# Patient Record
Sex: Female | Born: 1935
Health system: Southern US, Academic
[De-identification: ages and names within clinical notes are randomized; demographics above are authoritative.]

## PROBLEM LIST (undated history)

## (undated) ENCOUNTER — Ambulatory Visit

## (undated) ENCOUNTER — Ambulatory Visit: Payer: MEDICARE

## (undated) ENCOUNTER — Encounter
Attending: Student in an Organized Health Care Education/Training Program | Primary: Student in an Organized Health Care Education/Training Program

## (undated) ENCOUNTER — Encounter

## (undated) ENCOUNTER — Telehealth

## (undated) ENCOUNTER — Encounter: Attending: Hematology & Oncology | Primary: Hematology & Oncology

## (undated) ENCOUNTER — Encounter: Attending: Surgery | Primary: Surgery

## (undated) ENCOUNTER — Ambulatory Visit: Payer: MEDICARE | Attending: Surgery | Primary: Surgery

## (undated) ENCOUNTER — Telehealth: Attending: Radiation Oncology | Primary: Radiation Oncology

## (undated) ENCOUNTER — Ambulatory Visit: Payer: MEDICARE | Attending: Hematology & Oncology | Primary: Hematology & Oncology

## (undated) ENCOUNTER — Telehealth: Attending: Hematology & Oncology | Primary: Hematology & Oncology

## (undated) ENCOUNTER — Ambulatory Visit
Payer: MEDICARE | Attending: Student in an Organized Health Care Education/Training Program | Primary: Student in an Organized Health Care Education/Training Program

## (undated) ENCOUNTER — Ambulatory Visit
Attending: Student in an Organized Health Care Education/Training Program | Primary: Student in an Organized Health Care Education/Training Program

## (undated) DIAGNOSIS — K219 Gastro-esophageal reflux disease without esophagitis: Secondary | ICD-10-CM

## (undated) DIAGNOSIS — E119 Type 2 diabetes mellitus without complications: Secondary | ICD-10-CM

## (undated) DIAGNOSIS — I10 Essential (primary) hypertension: Secondary | ICD-10-CM

## (undated) DIAGNOSIS — T7840XA Allergy, unspecified, initial encounter: Secondary | ICD-10-CM

## (undated) DIAGNOSIS — B029 Zoster without complications: Secondary | ICD-10-CM

## (undated) DIAGNOSIS — E785 Hyperlipidemia, unspecified: Secondary | ICD-10-CM

## (undated) DIAGNOSIS — I639 Cerebral infarction, unspecified: Secondary | ICD-10-CM

## (undated) DIAGNOSIS — C801 Malignant (primary) neoplasm, unspecified: Secondary | ICD-10-CM

## (undated) HISTORY — PX: TONSILECTOMY, ADENOIDECTOMY, BILATERAL MYRINGOTOMY AND TUBES: SHX2538

## (undated) HISTORY — PX: KNEE SURGERY: SHX244

## (undated) HISTORY — PX: JOINT REPLACEMENT: SHX530

## (undated) HISTORY — PX: FOOT SURGERY: SHX648

## (undated) HISTORY — DX: Malignant (primary) neoplasm, unspecified: C80.1

## (undated) HISTORY — DX: Allergy, unspecified, initial encounter: T78.40XA

## (undated) HISTORY — PX: APPENDECTOMY: SHX54

## (undated) HISTORY — PX: BACK SURGERY: SHX140

## (undated) HISTORY — DX: Type 2 diabetes mellitus without complications: E11.9

## (undated) HISTORY — DX: Hyperlipidemia, unspecified: E78.5

## (undated) HISTORY — DX: Zoster without complications: B02.9

## (undated) HISTORY — DX: Cerebral infarction, unspecified: I63.9

---

## 2003-10-18 ENCOUNTER — Other Ambulatory Visit: Payer: Self-pay

## 2004-09-03 ENCOUNTER — Encounter: Payer: Self-pay | Admitting: Otolaryngology

## 2004-09-13 ENCOUNTER — Encounter: Payer: Self-pay | Admitting: Otolaryngology

## 2004-10-01 ENCOUNTER — Encounter: Payer: Self-pay | Admitting: Otolaryngology

## 2005-02-11 ENCOUNTER — Ambulatory Visit: Payer: Self-pay | Admitting: Specialist

## 2005-03-13 ENCOUNTER — Ambulatory Visit: Payer: Self-pay | Admitting: Unknown Physician Specialty

## 2005-03-14 ENCOUNTER — Ambulatory Visit: Payer: Self-pay | Admitting: Specialist

## 2005-04-13 ENCOUNTER — Ambulatory Visit: Payer: Self-pay | Admitting: Specialist

## 2005-05-14 ENCOUNTER — Ambulatory Visit: Payer: Self-pay | Admitting: Specialist

## 2005-06-14 ENCOUNTER — Ambulatory Visit: Payer: Self-pay | Admitting: Specialist

## 2006-08-08 ENCOUNTER — Emergency Department: Payer: Self-pay | Admitting: Internal Medicine

## 2006-08-08 ENCOUNTER — Other Ambulatory Visit: Payer: Self-pay

## 2006-12-22 ENCOUNTER — Emergency Department: Payer: Self-pay | Admitting: Emergency Medicine

## 2007-08-18 ENCOUNTER — Emergency Department: Payer: Self-pay | Admitting: Emergency Medicine

## 2007-08-18 ENCOUNTER — Other Ambulatory Visit: Payer: Self-pay

## 2007-08-18 ENCOUNTER — Ambulatory Visit: Payer: Self-pay | Admitting: Unknown Physician Specialty

## 2008-07-02 ENCOUNTER — Ambulatory Visit: Payer: Self-pay | Admitting: Unknown Physician Specialty

## 2008-08-31 ENCOUNTER — Encounter: Payer: Self-pay | Admitting: Rheumatology

## 2008-09-13 ENCOUNTER — Encounter: Payer: Self-pay | Admitting: Rheumatology

## 2009-01-20 ENCOUNTER — Ambulatory Visit: Payer: Self-pay | Admitting: Family

## 2009-02-13 ENCOUNTER — Encounter: Payer: Self-pay | Admitting: Physician Assistant

## 2009-02-25 ENCOUNTER — Ambulatory Visit: Payer: Self-pay | Admitting: Unknown Physician Specialty

## 2009-03-14 ENCOUNTER — Encounter: Payer: Self-pay | Admitting: Physician Assistant

## 2009-03-15 ENCOUNTER — Ambulatory Visit: Payer: Self-pay | Admitting: Pain Medicine

## 2009-03-20 ENCOUNTER — Other Ambulatory Visit: Payer: Self-pay | Admitting: Pain Medicine

## 2009-03-21 ENCOUNTER — Ambulatory Visit: Payer: Self-pay | Admitting: Pain Medicine

## 2009-04-10 ENCOUNTER — Ambulatory Visit: Payer: Self-pay | Admitting: Pain Medicine

## 2009-04-10 ENCOUNTER — Ambulatory Visit: Payer: Self-pay

## 2009-04-11 ENCOUNTER — Ambulatory Visit: Payer: Self-pay | Admitting: Pain Medicine

## 2009-04-26 ENCOUNTER — Ambulatory Visit: Payer: Self-pay | Admitting: Physician Assistant

## 2009-05-08 ENCOUNTER — Ambulatory Visit: Payer: Self-pay | Admitting: Pain Medicine

## 2009-05-11 ENCOUNTER — Ambulatory Visit: Payer: Self-pay | Admitting: Pain Medicine

## 2009-05-15 ENCOUNTER — Ambulatory Visit: Payer: Self-pay | Admitting: Pain Medicine

## 2009-05-24 ENCOUNTER — Ambulatory Visit: Payer: Self-pay | Admitting: Physician Assistant

## 2010-06-26 ENCOUNTER — Ambulatory Visit: Payer: Self-pay | Admitting: Otolaryngology

## 2010-07-25 ENCOUNTER — Encounter: Payer: Self-pay | Admitting: Unknown Physician Specialty

## 2010-08-14 ENCOUNTER — Encounter: Payer: Self-pay | Admitting: Unknown Physician Specialty

## 2011-04-01 ENCOUNTER — Emergency Department: Payer: Self-pay | Admitting: Emergency Medicine

## 2012-06-26 DIAGNOSIS — H18459 Nodular corneal degeneration, unspecified eye: Secondary | ICD-10-CM | POA: Insufficient documentation

## 2012-10-30 DIAGNOSIS — Z961 Presence of intraocular lens: Secondary | ICD-10-CM | POA: Insufficient documentation

## 2013-02-09 ENCOUNTER — Emergency Department: Payer: Self-pay | Admitting: Emergency Medicine

## 2013-02-09 LAB — COMPREHENSIVE METABOLIC PANEL
Albumin: 3.9 g/dL (ref 3.4–5.0)
Anion Gap: 5 — ABNORMAL LOW (ref 7–16)
Calcium, Total: 9.4 mg/dL (ref 8.5–10.1)
Chloride: 103 mmol/L (ref 98–107)
Creatinine: 0.77 mg/dL (ref 0.60–1.30)
EGFR (African American): >60
Glucose: 105 mg/dL — ABNORMAL HIGH (ref 65–99)
Osmolality: 282 (ref 275–301)
SGOT(AST): 23 U/L (ref 15–37)
SGPT (ALT): 13 U/L (ref 12–78)
Sodium: 140 mmol/L (ref 136–145)

## 2013-02-09 LAB — CBC
MCV: 92 fL (ref 80–100)
RDW: 14.4 % (ref 11.5–14.5)
WBC: 9.9 10*3/uL (ref 3.6–11.0)

## 2014-03-01 DIAGNOSIS — G43909 Migraine, unspecified, not intractable, without status migrainosus: Secondary | ICD-10-CM | POA: Insufficient documentation

## 2014-03-01 DIAGNOSIS — I10 Essential (primary) hypertension: Secondary | ICD-10-CM | POA: Insufficient documentation

## 2014-03-01 DIAGNOSIS — E785 Hyperlipidemia, unspecified: Secondary | ICD-10-CM | POA: Insufficient documentation

## 2014-03-01 DIAGNOSIS — E119 Type 2 diabetes mellitus without complications: Secondary | ICD-10-CM | POA: Insufficient documentation

## 2014-03-01 DIAGNOSIS — K219 Gastro-esophageal reflux disease without esophagitis: Secondary | ICD-10-CM | POA: Insufficient documentation

## 2014-05-16 ENCOUNTER — Ambulatory Visit: Payer: Self-pay | Admitting: Pain Medicine

## 2014-05-31 ENCOUNTER — Ambulatory Visit: Payer: Self-pay | Admitting: Pain Medicine

## 2014-06-07 ENCOUNTER — Ambulatory Visit: Payer: Self-pay | Admitting: Pain Medicine

## 2014-06-27 ENCOUNTER — Ambulatory Visit: Payer: Self-pay | Admitting: Pain Medicine

## 2014-08-02 ENCOUNTER — Ambulatory Visit: Payer: Self-pay | Admitting: Pain Medicine

## 2014-08-22 ENCOUNTER — Ambulatory Visit: Payer: Self-pay | Admitting: Pain Medicine

## 2016-06-06 ENCOUNTER — Telehealth: Payer: Self-pay | Admitting: *Deleted

## 2016-08-01 ENCOUNTER — Encounter: Payer: Self-pay | Admitting: Pain Medicine

## 2016-08-01 ENCOUNTER — Ambulatory Visit: Payer: Medicare Other | Attending: Pain Medicine | Admitting: Pain Medicine

## 2016-08-01 VITALS — BP 150/69 | HR 87 | Temp 97.8°F | Resp 16 | Ht 60.0 in | Wt 130.0 lb

## 2016-08-01 DIAGNOSIS — Z8673 Personal history of transient ischemic attack (TIA), and cerebral infarction without residual deficits: Secondary | ICD-10-CM | POA: Insufficient documentation

## 2016-08-01 DIAGNOSIS — E119 Type 2 diabetes mellitus without complications: Secondary | ICD-10-CM | POA: Insufficient documentation

## 2016-08-01 DIAGNOSIS — G8929 Other chronic pain: Secondary | ICD-10-CM | POA: Insufficient documentation

## 2016-08-01 DIAGNOSIS — M791 Myalgia: Secondary | ICD-10-CM | POA: Insufficient documentation

## 2016-08-01 DIAGNOSIS — E785 Hyperlipidemia, unspecified: Secondary | ICD-10-CM | POA: Diagnosis not present

## 2016-08-01 DIAGNOSIS — M533 Sacrococcygeal disorders, not elsewhere classified: Secondary | ICD-10-CM | POA: Diagnosis not present

## 2016-08-01 DIAGNOSIS — K219 Gastro-esophageal reflux disease without esophagitis: Secondary | ICD-10-CM | POA: Insufficient documentation

## 2016-08-01 DIAGNOSIS — Z880 Allergy status to penicillin: Secondary | ICD-10-CM | POA: Insufficient documentation

## 2016-08-01 DIAGNOSIS — I10 Essential (primary) hypertension: Secondary | ICD-10-CM | POA: Diagnosis not present

## 2016-08-01 DIAGNOSIS — M7918 Myalgia, other site: Secondary | ICD-10-CM

## 2016-08-01 DIAGNOSIS — Z961 Presence of intraocular lens: Secondary | ICD-10-CM | POA: Insufficient documentation

## 2016-08-01 NOTE — Progress Notes (Signed)
Patient's Name: Destiny [name]  MRN: [id number]  Referring Provider: Derinda Late, MD  DOB: 05/12/1924  PCP: No primary care provider on file.  DOS: 08/01/2016  Note by: Kathlen Brunswick. Dossie Arbour, MD  Service setting: Ambulatory outpatient  Specialty: Interventional Pain Management  Location: [facility] (AMB) Pain Management Facility    Patient type: New Patient   Primary Reason(s) for Visit: Initial Patient Evaluation CC: Back Pain (lower)  HPI  Ms. Overmier is a 82 y.o. year old, female patient, who comes today for an initial evaluation. She has Myofascial pain syndrome; Chronic left sacroiliac pain; Esophageal reflux; Benign essential hypertension; Migraine; Other and unspecified hyperlipidemia; Pseudophakia, both eyes; Salzmann's nodular dystrophy; and Type II or unspecified type diabetes mellitus without mention of complication, not stated as uncontrolled on her problem list.. Her primarily concern today is the Back Pain (lower)  Pain Assessment: Self-Reported Pain Score: 2 /10             Reported level is compatible with observation.       Pain Type: Chronic pain Pain Location: Back Pain Orientation: Lower Pain Descriptors / Indicators: Aching, Dull (more on the left side of lower back) Pain Frequency: Intermittent (pain is mainly in the morning when getting up out of bed  and lasts until  she is moving)  Onset and Duration: Sudden and Present longer than 3 months Cause of pain: Arthritis Severity: Getting worse, NAS-11 at its worse: 10/10 and NAS-11 on the average: 8/10 Timing: After activity or exercise Aggravating Factors: Bending, Lifiting, Prolonged sitting and Prolonged standing Alleviating Factors: Sitting Associated Problems: Pain that wakes patient up and Pain that does not allow patient to sleep Quality of Pain: Aching, Annoying, Sharp and Shooting Previous Examinations or Tests: Nerve block Previous Treatments: Trigger point injections  The patient comes into the clinics today  for the first time for a chronic pain management evaluation. The patient indicates having some left-sided low back pain for which she received an injection from me, many years ago. She did extremely well and it wasn't until recently that she started having some additional pain. By the time that she comes back into the clinics she indicates that her pain is minimal and she doesn't want to do anything at this point. She wants to have the option of coming in to have a shot whenever the pain returns. I told her that we would be more than happy to accommodate her for this. She is not interested in any medication management.  Today I took the time to provide the patient with information regarding my pain practice. The patient was informed that my practice is divided into two sections: an interventional pain management section, as well as a completely separate and distinct medication management section. The interventional portion of my practice takes place on Tuesdays and Thursdays, while the medication management is conducted on Mondays and Wednesdays. Because of the amount of documentation required on both them, they are kept separated. This means that there is the possibility that the patient may be scheduled for a procedure on Tuesday, while also having a medication management appointment on Wednesday. I have also informed the patient that because of current staffing and facility limitations, I no longer take patients for medication management only. To illustrate the reasons for this, I gave the patient the example of a surgeon and how inappropriate it would be to refer a patient to his/her practice so that they write for the post-procedure antibiotics on a surgery done by someone  else.   The patient was informed that joining my practice means that they are open to any and all interventional therapies. I clarified for the patient that this does not mean that they will be forced to have any procedures done. What it  means is that patients looking for a practitioner to simply write for their pain medications and not take advantage of other interventional techniques will be better served by a different practitioner, other than myself. I made it clear that I prefer to spend my time providing those services that I specialize in.  The patient was also made aware of my Comprehensive Pain Management Safety Guidelines where by joining my practice, they limit all of their nerve blocks and joint injections to those done by our practice, for as long as we are retained to manage their care.   Historic Controlled Substance Pharmacotherapy Review  Risk Assessment Profile: Aberrant/High Risk Behavior: None observed or detected today Risk Factors for Fatal Opioid Overdose: None identified today Fatal overdose hazard ratio (HR): Calculation deferred Non-fatal overdose hazard ratio (HR): Calculation deferred Substance Use Disorder (SUD) Risk Level: Pending results of Medical Psychology Evaluation for SUD Opioid Risk Tool (ORT) Total Score: 0 ORT Score Interpretation:  Score <3 = Low Risk for SUD  Score between 4-7 = Moderate Risk for SUD  Score >8 = High Risk for Opioid Abuse  Depression Scale Score: PHQ-2: 0   No depression (0) PHQ-9: 0   No depression (0-4)  Pharmacologic Plan: She is not interested in any medication therapy. The patient  reports that she does not use drugs.. Historical Illicit Drug Screen Labs(s): No results found for: MDMA, COCAINSCRNUR, PCPSCRNUR, THCU, ETH Meds  The patient has a current medication list which includes the following prescription(s): aspirin, butalbital-acetaminophen-caffeine, cetirizine, vitamin d3, cyanocobalamin, doxepin, fluticasone, glucose blood, guaifenesin-codeine, ibuprofen, metformin, onetouch delica lancets fine, probiotic product, telmisartan, and UNABLE TO FIND.  No current outpatient prescriptions on file prior to visit.   No current facility-administered  medications on file prior to visit.    Imaging Review  Shoulder Imaging: Shoulder-R MR wo contrast:  Results for orders placed in visit on 07/02/08  MR Shoulder Right Wo Contrast   Narrative * PRIOR REPORT IMPORTED FROM AN EXTERNAL SYSTEM *   PRIOR REPORT IMPORTED FROM THE [facility] WORKFLOW SYSTEM   REASON FOR EXAM:    rt shoulder pn  COMMENTS:   PROCEDURE:     MR  - MR SHOULDER RT  WO CONTRAST  - Jul 02 2008 12:23PM   RESULT:   HISTORY: Pain.   COMPARISON STUDIES: None.   PROCEDURE AND FINDINGS: Multiplanar/multisequence imaging of the RIGHT  shoulder is obtained. Acromioclavicular degenerative change with downward  sloping acromion and prominent spurring is noted with associated prominent  supraspinatus tendinous impingement and what appears to be a full  thickness  tear of the supraspinatus tendon.  Subacromial and subdeltoid fluid is  noted. The subscapularis is intact. The infraspinatus and teres minor are  intact. The long head of the biceps tendon is intact. Subchondral cysts  are  noted in the humeral head consistent with degenerative disease. No  definite  glenoid labral tear is noted.   IMPRESSION:   1.Acromioclavicular degenerative change with prominent subacromial  spurring  and supraspinatus tendinous impingement resulting in complete tear of the  supraspinatus tendon. The tear appears to be macerated. Associated  subacromial and subdeltoid fluid is noted.   2.Subchondral degenerative changes of the humeral head noted.   Thank you  for the opportunity to contribute to the care of your patient.       Lumbosacral Imaging: Lumbar MR w/wo contrast:  Results for orders placed in visit on 02/25/09  MR Lumbar Spine W Wo Contrast   Narrative * PRIOR REPORT IMPORTED FROM AN EXTERNAL SYSTEM *   PRIOR REPORT IMPORTED FROM THE [facility] WORKFLOW SYSTEM   REASON FOR EXAM:    Low Back Pain   Pt had surgery  COMMENTS:  Daughter [phone]   PROCEDURE:     MR  - MR  LUMBAR SPINE WO/W  - Feb 25 2009  1:56PM   RESULT:     Sagittal and axial T1 pre- and postgadolinium images as well  as  T2 weighted images were obtained through the lumbar spine.   The lumbar vertebral bodies are preserved in height. The intervertebral  disc  space heights are well-maintained. The vertebral body marrow signal is  normal. There is no abnormal enhancement post gadolinium. There are  ventral  impressions made upon the CSF filled thecal sac at the L2-L3, L3-L4,  L4-L5,  and L5-S1 disc levels. Axial imaging was obtained from the T11-T12 level  to  the mid body of S1.   At T12-L1 the AP dimension of the thecal sac measures approximately 15 mm  and the neural foramina are widely patent. At L1-L2 the AP dimension is  also  approximately 15 mm with wide patency of the neural foramina.   At the L2-L3 there is a broadly based annular disc bulge. This decreases  the  AP dimension of the thecal sac to 11 mm in the midline. There is no  significant neural foraminal encroachment.   At the L3-L4 disc level at there is annular disc bulging. This decreases  the  AP dimension the thecal sac to 10 mm. There is encroachment upon the  neural  foramina bilaterally. There is facet joint hypertrophy as well.   At the L4-L5 level there is annular disc bulging. The AP dimension the  thecal sac is reduced to 10 mm. There is encroachment upon the right  neural  foramen. This is quite minimal. On the left there is moderate encroachment  due to a prominent facet joint osteophyte.   At the L5-S1 level there is annular disc bulging. In the midline the AP  dimension the thecal sac is 12 mm. There is mild encroachment upon the  left  neural foramen chiefly to 2 facet joint hypertrophy here.   IMPRESSION:  1. I do not see evidence of a compression fracture nor of abnormal marrow  signal within the lumbar vertebral bodies.  2. There is degenerative annular disc bulging at the L2-L3 level  through  the  L5-S1 level as described above. I do not see severe spinal stenosis. The  minimal AP dimension of the thecal sac is seen to be approximately 10 mm.  There is an element of facet joint degenerative change especially on the  left which contributes to mild neural foraminal encroachment predominantly  on the left at several levels.   Incidental note is made of fluid signal structures associated with the  parenchyma of both kidneys most compatible with cysts. These could be  evaluated electively with ultrasound.       Note: Imaging results reviewed.  ROS  Cardiovascular History: Daily Aspirin intake, Hypertension and Needs antibiotics prior to dental procedures Pulmonary or Respiratory History: Negative for bronchial asthma, emphysema, chronic smoking, chronic bronchitis, sarcoidosis, tuberculosis or sleep apena Neurological  History: Negative for epilepsy, stroke, urinary or fecal inontinence, spina bifida or tethered cord syndrome Review of Past Neurological Studies: No results found for this or any previous visit. Psychological-Psychiatric History: Anxiety Gastrointestinal History: Reflux or heatburn Genitourinary History: Negative for nephrolithiasis, hematuria, renal failure or chronic kidney disease Hematological History: Brusing easily Endocrine History: Non-insulin-dependent diabetes mellitus Rheumatologic History: Negative for lupus, osteoarthritis, rheumatoid arthritis, myositis, polymyositis or fibromyagia Musculoskeletal History: Negative for myasthenia gravis, muscular dystrophy, multiple sclerosis or malignant hyperthermia Work History: Retired  Allergies  Ms. Fournier is allergic to ampicillin.  Laboratory Chemistry  Inflammation Markers No results found for: ESRSEDRATE, CRP Renal Function Lab Results  Component Value Date   BUN 20 (H) 02/09/2013   CREATININE 0.77 02/09/2013   GFRAA >60 02/09/2013   GFRNONAA >60 02/09/2013   Hepatic Function Lab  Results  Component Value Date   AST 23 02/09/2013   ALT 13 02/09/2013   ALBUMIN 3.9 02/09/2013   Electrolytes Lab Results  Component Value Date   NA 140 02/09/2013   K 3.8 02/09/2013   CL 103 02/09/2013   CALCIUM 9.4 02/09/2013   Pain Modulating Vitamins No results found for: Maralyn Sago [id number], [id number], [id number], 25OHVITD1, 25OHVITD2, 25OHVITD3, VITAMINB12 Coagulation Parameters Lab Results  Component Value Date   PLT 207 02/09/2013   Cardiovascular Lab Results  Component Value Date   HGB 13.5 02/09/2013   HCT 40.4 02/09/2013   Note: Lab results reviewed.  PFSH  Drug: Ms. Grieco  reports that she does not use drugs. Alcohol:  reports that she does not drink alcohol. Tobacco:  reports that she has never smoked. She has never used smokeless tobacco. Medical:  has a past medical history of Allergy; Diabetes mellitus without complication ([place]); Hyperlipidemia; Shingles; and Stroke ([place]). Family: family history includes Stroke in her father and mother.  Past Surgical History:  Procedure Laterality Date  . APPENDECTOMY    . BACK SURGERY    . FOOT SURGERY Bilateral    [place]  . JOINT REPLACEMENT    . KNEE SURGERY Bilateral   . TONSILECTOMY, ADENOIDECTOMY, BILATERAL MYRINGOTOMY AND TUBES     Active Ambulatory Problems    Diagnosis Date Noted  . Myofascial pain syndrome 08/01/2016  . Chronic left sacroiliac pain 08/01/2016  . Esophageal reflux 03/01/2014  . Benign essential hypertension 03/01/2014  . Migraine 03/01/2014  . Other and unspecified hyperlipidemia 03/01/2014  . Pseudophakia, both eyes 10/30/2012  . Salzmann's nodular dystrophy 06/26/2012  . Type II or unspecified type diabetes mellitus without mention of complication, not stated as uncontrolled 03/01/2014   Resolved Ambulatory Problems    Diagnosis Date Noted  . No Resolved Ambulatory Problems   Past Medical History:  Diagnosis Date  . Allergy   . Diabetes mellitus without complication ([place])    . Hyperlipidemia   . Shingles   . Stroke [facility])    Constitutional Exam  General appearance: Well nourished, well developed, and well hydrated. In no apparent acute distress Vitals:   08/01/16 1331  BP: (!) 150/69  Pulse: 87  Resp: 16  Temp: 97.8 F (36.6 C)  TempSrc: Oral  SpO2: 96%  Weight: 130 lb (59 kg)  Height: 5' (1.524 m)   BMI Assessment: Estimated body mass index is 25.39 kg/m as calculated from the following:   Height as of this encounter: 5' (1.524 m).   Weight as of this encounter: 130 lb (59 kg).  BMI interpretation table: BMI level Category Range association with higher incidence of chronic pain  <18  kg/m2 Underweight   18.5-24.9 kg/m2 Ideal body weight   25-29.9 kg/m2 Overweight Increased incidence by 20%  30-34.9 kg/m2 Obese (Class I) Increased incidence by 68%  35-39.9 kg/m2 Severe obesity (Class II) Increased incidence by 136%  >40 kg/m2 Extreme obesity (Class III) Increased incidence by 254%   BMI Readings from Last 4 Encounters:  08/01/16 25.39 kg/m   Wt Readings from Last 4 Encounters:  08/01/16 130 lb (59 kg)  Psych/Mental status: Alert, oriented x 3 (person, place, & time) Eyes: PERLA Respiratory: No evidence of acute respiratory distress  Cervical Spine Exam  Inspection: No masses, redness, or swelling Alignment: Symmetrical Functional ROM: Unrestricted ROM Stability: No instability detected Muscle strength & Tone: Functionally intact Sensory: Unimpaired Palpation: Non-contributory  Upper Extremity (UE) Exam    Side: Right upper extremity  Side: Left upper extremity  Inspection: No masses, redness, swelling, or asymmetry  Inspection: No masses, redness, swelling, or asymmetry  Functional ROM: Unrestricted ROM         Functional ROM: Unrestricted ROM          Muscle strength & Tone: Functionally intact  Muscle strength & Tone: Functionally intact  Sensory: Unimpaired  Sensory: Unimpaired  Palpation: Non-contributory  Palpation:  Non-contributory   Thoracic Spine Exam  Inspection: No masses, redness, or swelling Alignment: Symmetrical Functional ROM: Unrestricted ROM Stability: No instability detected Sensory: Unimpaired Muscle strength & Tone: Functionally intact Palpation: Non-contributory  Lumbar Spine Exam  Inspection: No masses, redness, or swelling Alignment: Symmetrical Functional ROM: Unrestricted ROM Stability: No instability detected Muscle strength & Tone: Functionally intact Sensory: Unimpaired Palpation: Non-contributory Provocative Tests: Lumbar Hyperextension and rotation test: evaluation deferred today       Patrick's Maneuver: evaluation deferred today              Gait & Posture Assessment  Ambulation: Unassisted Gait: Relatively normal for age and body habitus Posture: WNL   Lower Extremity Exam    Side: Right lower extremity  Side: Left lower extremity  Inspection: No masses, redness, swelling, or asymmetry  Inspection: No masses, redness, swelling, or asymmetry  Functional ROM: Unrestricted ROM          Functional ROM: Unrestricted ROM          Muscle strength & Tone: Functionally intact  Muscle strength & Tone: Functionally intact  Sensory: Unimpaired  Sensory: Unimpaired  Palpation: Non-contributory  Palpation: Non-contributory   Assessment  Primary Diagnosis & Pertinent Problem List: The primary encounter diagnosis was Myofascial pain syndrome. A diagnosis of Chronic left sacroiliac pain was also pertinent to this visit.  Visit Diagnosis: 1. Myofascial pain syndrome   2. Chronic left sacroiliac pain    Plan of Care  Initial Treatment Plan:  Please be advised that as per protocol, today's visit has been an evaluation only. We have not taken over the patient's controlled substance management.  Problem-Specific Plan: No problem-specific Assessment & Plan notes found for this encounter.  Ordered Lab-work, Procedure(s), Referral(s), & Consult(s): Orders Placed This  Encounter  Procedures  . TRIGGER POINT INJECTION  . SACROILIAC JOINT INJECTINS   Pharmacotherapy: Medications ordered:  No orders of the defined types were placed in this encounter.  Medications administered during this visit: Ms. Kasprzyk had no medications administered during this visit.   Pharmacotherapy under consideration:  Opioid Analgesics: The patient was informed that there is no guarantee that she would be a candidate for opioid analgesics. The decision will be made following CDC guidelines. This decision will be based on  the results of diagnostic studies, as well as Ms. Holstein's risk profile.    Interventional therapies under consideration: The patient was informed that there is no guarantee that she would be a candidate for interventional therapies. The decision will be based on the results of diagnostic studies, as well as Ms. Kocurek's risk profile. Left  PRN palliative sacroiliac joint injections under fluoroscopic guidance, no sedation.    Requested PM Follow-up: Return if symptoms worsen or fail to improve, for (PRN) Procedure.  No future appointments.  Primary Care Physician: No primary care provider on file. Location: [facility] Note by: Adrianna Dudas A. Dossie Arbour, M.D, DABA, DABAPM, DABPM, DABIPP, FIPP  Pain Score Disclaimer: We use the NRS-11 scale. This is a self-reported, subjective measurement of pain severity with only modest accuracy. It is used primarily to identify changes within a particular patient. It must be understood that outpatient pain scales are significantly less accurate that those used for research, where they can be applied under ideal controlled circumstances with minimal exposure to variables. In reality, the score is likely to be a combination of pain intensity and pain affect, where pain affect describes the degree of emotional arousal or changes in action readiness caused by the sensory experience of pain. Factors such as  social and work situation, setting, emotional state, anxiety levels, expectation, and prior pain experience may influence pain perception and show large inter-individual differences that may also be affected by time variables.  Patient instructions provided during this appointment: Patient Instructions  Patient will call for appointment when needed.

## 2016-08-01 NOTE — Progress Notes (Signed)
Safety precautions to be maintained throughout the outpatient stay will include: orient to surroundings, keep bed in low position, maintain call bell within reach at all times, provide assistance with transfer out of bed and ambulation.  

## 2016-08-01 NOTE — Patient Instructions (Signed)
Patient will call for appointment when needed.

## 2016-10-04 ENCOUNTER — Emergency Department: Payer: Medicare Other

## 2016-10-04 ENCOUNTER — Encounter: Payer: Self-pay | Admitting: Emergency Medicine

## 2016-10-04 ENCOUNTER — Emergency Department
Admission: EM | Admit: 2016-10-04 | Discharge: 2016-10-04 | Disposition: A | Discharge status: Home / Self Care | Payer: Medicare Other | Attending: Student in an Organized Health Care Education/Training Program | Admitting: Student in an Organized Health Care Education/Training Program

## 2016-10-04 DIAGNOSIS — R519 Headache, unspecified: Secondary | ICD-10-CM

## 2016-10-04 DIAGNOSIS — Z791 Long term (current) use of non-steroidal anti-inflammatories (NSAID): Secondary | ICD-10-CM | POA: Insufficient documentation

## 2016-10-04 DIAGNOSIS — Z7984 Long term (current) use of oral hypoglycemic drugs: Secondary | ICD-10-CM | POA: Insufficient documentation

## 2016-10-04 DIAGNOSIS — I10 Essential (primary) hypertension: Secondary | ICD-10-CM | POA: Insufficient documentation

## 2016-10-04 DIAGNOSIS — E119 Type 2 diabetes mellitus without complications: Secondary | ICD-10-CM | POA: Insufficient documentation

## 2016-10-04 DIAGNOSIS — R51 Headache: Secondary | ICD-10-CM | POA: Diagnosis present

## 2016-10-04 DIAGNOSIS — Z79899 Other long term (current) drug therapy: Secondary | ICD-10-CM | POA: Diagnosis not present

## 2016-10-04 DIAGNOSIS — Z7982 Long term (current) use of aspirin: Secondary | ICD-10-CM | POA: Insufficient documentation

## 2016-10-04 LAB — BASIC METABOLIC PANEL
Anion gap: 9 (ref 5–15)
BUN: 20 mg/dL (ref 6–20)
CHLORIDE: 99 mmol/L — AB (ref 101–111)
CO2: 30 mmol/L (ref 22–32)
Calcium: 9.1 mg/dL (ref 8.9–10.3)
Creatinine, Ser: 1.02 mg/dL — ABNORMAL HIGH (ref 0.44–1.00)
GFR calc Af Amer: 54 mL/min — ABNORMAL LOW (ref >60)
GFR calc non Af Amer: 47 mL/min — ABNORMAL LOW (ref >60)
Glucose, Bld: 144 mg/dL — ABNORMAL HIGH (ref 65–99)
POTASSIUM: 3.4 mmol/L — AB (ref 3.5–5.1)
SODIUM: 138 mmol/L (ref 135–145)

## 2016-10-04 LAB — CBC
HEMATOCRIT: 37.9 % (ref 35.0–47.0)
Hemoglobin: 13 g/dL (ref 12.0–16.0)
MCH: 31.1 pg (ref 26.0–34.0)
MCHC: 34.4 g/dL (ref 32.0–36.0)
MCV: 90.4 fL (ref 80.0–100.0)
Platelets: 197 10*3/uL (ref 150–440)
RBC: 4.2 MIL/uL (ref 3.80–5.20)
RDW: 14.7 % — AB (ref 11.5–14.5)
WBC: 8.2 10*3/uL (ref 3.6–11.0)

## 2016-10-04 LAB — TROPONIN I: Troponin I: <0.03 ng/mL (ref <0.03)

## 2016-10-04 MED ORDER — AMLODIPINE BESYLATE 2.5 MG PO TABS: 2.5000 mg | ORAL_TABLET | Freq: Every day | ORAL | 0 refills | Status: AC

## 2016-10-04 MED ORDER — ACETAMINOPHEN 500 MG PO TABS
1000.0000 mg | ORAL_TABLET | Freq: Once | ORAL | Status: AC
  Administered 2016-10-04: 1000 mg via ORAL
  Filled 2016-10-04: qty 2

## 2016-10-04 MED ORDER — AMLODIPINE BESYLATE 5 MG PO TABS
5.0000 mg | ORAL_TABLET | Freq: Once | ORAL | Status: AC
  Administered 2016-10-04: 5 mg via ORAL
  Filled 2016-10-04: qty 1

## 2016-10-04 NOTE — ED Triage Notes (Signed)
Patient reports from [facility] with elevated B/P and headache for 24 hours.176/83 for KC. +uri

## 2016-10-04 NOTE — Discharge Instructions (Signed)

## 2016-10-04 NOTE — ED Provider Notes (Signed)
[facility] Emergency Department Provider Note    First MD Initiated Contact with Patient 10/04/16 1729     (approximate)  I have reviewed the triage vital signs and the nursing notes.   HISTORY  Chief Complaint Headache    HPI Destiny [name] is a 82 y.o. female presents with 2 days of headache and increasing blood pressure. Patient takes, [place] for her blood pressure. Denies any lateralizing weakness, chest pain or shortness of breath. Tried to have follow-up with primary care physician but was unable to coordinate a appointment therefore they directed her to the ER due to concern for CVA.   Past Medical History:  Diagnosis Date  . Allergy   . Diabetes mellitus without complication ([place])   . Hyperlipidemia   . Shingles   . Stroke [facility])    Family History  Problem Relation Age of Onset  . Stroke Mother   . Stroke Father    Past Surgical History:  Procedure Laterality Date  . APPENDECTOMY    . BACK SURGERY    . FOOT SURGERY Bilateral    [place]  . JOINT REPLACEMENT    . KNEE SURGERY Bilateral   . TONSILECTOMY, ADENOIDECTOMY, BILATERAL MYRINGOTOMY AND TUBES     Patient Active Problem List   Diagnosis Date Noted  . Myofascial pain syndrome 08/01/2016  . Chronic left sacroiliac pain 08/01/2016  . Esophageal reflux 03/01/2014  . Benign essential hypertension 03/01/2014  . Migraine 03/01/2014  . Other and unspecified hyperlipidemia 03/01/2014  . Type II or unspecified type diabetes mellitus without mention of complication, not stated as uncontrolled 03/01/2014  . Pseudophakia, both eyes 10/30/2012  . Salzmann's nodular dystrophy 06/26/2012      Prior to Admission medications   Medication Sig Start Date End Date Taking? Authorizing Provider  aspirin (GOODSENSE ASPIRIN) 325 MG tablet Take 325 mg by mouth.    Historical Provider, MD  butalbital-acetaminophen-caffeine (FIORICET, ESGIC) 50-325-40 MG tablet TAKE ONE TABLET BY MOUTH EVERY 4  HOURS AS NEEDED 08/08/15   Historical Provider, MD  cetirizine (ZYRTEC) 10 MG tablet Take by mouth. 08/31/15 08/30/16  Historical Provider, MD  Cholecalciferol (VITAMIN D3) 1000 units CAPS Take 1,000 Units by mouth daily.     Historical Provider, MD  Cyanocobalamin (RA VITAMIN B-12 TR) 1000 MCG TBCR Take 1,000 mcg by mouth daily.     Historical Provider, MD  doxepin (SINEQUAN) 10 MG capsule TAKE ONE CAPSULE BY MOUTH EVERY NIGHT AT BEDTIME 07/30/16   Historical Provider, MD  fluticasone (FLONASE) 50 MCG/ACT nasal spray Place into the nose as needed.  08/31/15 08/30/16  Historical Provider, MD  glucose blood (ONE TOUCH ULTRA TEST) test strip EVERY DAY 04/15/16   Historical Provider, MD  guaiFENesin-codeine (ROBITUSSIN AC) 100-10 MG/5ML syrup 1 TSP EVERY 8 HRS. PRN 01/19/16   Historical Provider, MD  ibuprofen (ADVIL,MOTRIN) 200 MG tablet Take by mouth as needed.     Historical Provider, MD  metFORMIN (GLUCOPHAGE) 500 MG tablet TAKE ONE (1) TABLET BY MOUTH EVERY DAY 07/01/16   Historical Provider, MD  Jonetta Speak LANCETS FINE MISC EVERY DAY 04/15/16   Historical Provider, MD  Probiotic Product (SOLUBLE FIBER/PROBIOTICS PO) Take 1 tablet by mouth daily.    Historical Provider, MD  telmisartan (MICARDIS) 80 MG tablet 80 mg daily.  07/30/16   Historical Provider, MD  UNABLE TO FIND Juice plus(powder)-supplement taken daily with liquid    Historical Provider, MD    Allergies Ampicillin    Social History Social History  Substance Use Topics  . Smoking status: Never Smoker  . Smokeless tobacco: Never Used  . Alcohol use No    Review of Systems Patient denies headaches, rhinorrhea, blurry vision, numbness, shortness of breath, chest pain, edema, cough, abdominal pain, nausea, vomiting, diarrhea, dysuria, fevers, rashes or hallucinations unless otherwise stated above in HPI. ____________________________________________   PHYSICAL EXAM:  VITAL SIGNS: Vitals:   10/04/16 1716 10/04/16 1733  BP:  (!) 171/75 (!) 160/82  Pulse: 81 79  Resp: 16   Temp: 98.2 F (36.8 C)     Constitutional: Alert and oriented  Appears much younger than stated age . Well appearing and in no acute distress. Eyes: Conjunctivae are normal. PERRL. EOMI. Head: Atraumatic. Nose: No congestion/rhinnorhea. Mouth/Throat: Mucous membranes are moist.  Oropharynx non-erythematous. Neck: No stridor. Painless ROM. No cervical spine tenderness to palpation Hematological/Lymphatic/Immunilogical: No cervical lymphadenopathy. Cardiovascular: Normal rate, regular rhythm. Grossly normal heart sounds.  Good peripheral circulation. Respiratory: Normal respiratory effort.  No retractions. Lungs CTAB. Gastrointestinal: Soft and nontender. No distention. No abdominal bruits. No CVA tenderness. Genitourinary:  Musculoskeletal: No lower extremity tenderness nor edema.  No joint effusions. Neurologic: CN- intact.  No facial droop, Normal FNF.  Normal heel to shin.  Sensation intact bilaterally. Normal speech and language. No gross focal neurologic deficits are appreciated. No gait instability. Skin:  Skin is warm, dry and intact. No rash noted. Psychiatric: Mood and affect are normal. Speech and behavior are normal.  ____________________________________________   LABS (all labs ordered are listed, but only abnormal results are displayed)  Results for orders placed or performed during the hospital encounter of 10/04/16 (from the past 24 hour(s))  Basic metabolic panel     Status: Abnormal   Collection Time: 10/04/16  5:22 PM  Result Value Ref Range   Sodium 138 135 - 145 mmol/L   Potassium 3.4 (L) 3.5 - 5.1 mmol/L   Chloride 99 (L) 101 - 111 mmol/L   CO2 30 22 - 32 mmol/L   Glucose, Bld 144 (H) 65 - 99 mg/dL   BUN 20 6 - 20 mg/dL   Creatinine, Ser 1.02 (H) 0.44 - 1.00 mg/dL   Calcium 9.1 8.9 - 10.3 mg/dL   GFR calc non Af Amer 47 (L) >60 mL/min   GFR calc Af Amer 54 (L) >60 mL/min   Anion gap 9 5 - 15  CBC      Status: Abnormal   Collection Time: 10/04/16  5:22 PM  Result Value Ref Range   WBC 8.2 3.6 - 11.0 K/uL   RBC 4.20 3.80 - 5.20 MIL/uL   Hemoglobin 13.0 12.0 - 16.0 g/dL   HCT 37.9 35.0 - 47.0 %   MCV 90.4 80.0 - 100.0 fL   MCH 31.1 26.0 - 34.0 pg   MCHC 34.4 32.0 - 36.0 g/dL   RDW 14.7 (H) 11.5 - 14.5 %   Platelets 197 150 - 440 K/uL  Troponin I     Status: None   Collection Time: 10/04/16  5:22 PM  Result Value Ref Range   Troponin I <0.03 <0.03 ng/mL   ____________________________________________  EKG My review and personal interpretation at Time: 17:21   Indication: htn  Rate: 78  Rhythm: sinus Axis: normal Other: poor r wave progression, no stemi ____________________________________________  RADIOLOGY  I personally reviewed all radiographic images ordered to evaluate for the above acute complaints and reviewed radiology reports and findings.  These findings were personally discussed with the patient.  Please see medical record  for radiology report.  ____________________________________________   PROCEDURES  Procedure(s) performed:  Procedures    Critical Care performed: no ____________________________________________   INITIAL IMPRESSION / ASSESSMENT AND PLAN / ED COURSE  Pertinent labs & imaging results that were available during my care of the patient were reviewed by me and considered in my medical decision making (see chart for details).  DDX: htnive urgency, chf, renal insufficiency, cva,   LEYANA HAVRON is a 82 y.o. who presents to the ED with headache for the past 2 days and concern for elevated blood pressure. Patient without any other signs of end organ damage. She has no focal neurodeficits. Does describe mild headache. Not clinically consistent with subarachnoid hemorrhage. Denies any chest pain or shortness of breath. Will order CT head due to concern for possible CVA versus edema which could be causing the patient's headache. We'll order chest x-ray  to evaluate for any evidence of congestive heart failure.  Clinical Course as of Oct 04 1849  Fri Oct 04, 2016  1849 CT head with no evidence of acute edema or abnormality. Chest x-ray without congestive heart failure. Patient remains asymptomatic. We'll start patient on low-dose Norvasc and arrange for follow-up with PCP.  [PR]    Clinical Course User Index [PR] Merlyn Lot, MD     ____________________________________________   FINAL CLINICAL IMPRESSION(S) / ED DIAGNOSES  Final diagnoses:  Hypertension, unspecified type  Acute nonintractable headache, unspecified headache type      NEW MEDICATIONS STARTED DURING THIS VISIT:  New Prescriptions   No medications on file     Note:  This document was prepared using Dragon voice recognition software and may include unintentional dictation errors.    Merlyn Lot, MD 10/04/16 [phone]

## 2017-07-29 ENCOUNTER — Emergency Department: Payer: Medicare Other

## 2017-07-29 ENCOUNTER — Encounter: Payer: Self-pay | Admitting: Emergency Medicine

## 2017-07-29 ENCOUNTER — Observation Stay
Admission: EM | Admit: 2017-07-29 | Discharge: 2017-07-30 | Disposition: A | Discharge status: Home / Self Care | Payer: Medicare Other | Attending: Internal Medicine | Admitting: Internal Medicine

## 2017-07-29 DIAGNOSIS — Z880 Allergy status to penicillin: Secondary | ICD-10-CM | POA: Diagnosis not present

## 2017-07-29 DIAGNOSIS — K219 Gastro-esophageal reflux disease without esophagitis: Secondary | ICD-10-CM | POA: Diagnosis not present

## 2017-07-29 DIAGNOSIS — E785 Hyperlipidemia, unspecified: Secondary | ICD-10-CM | POA: Diagnosis not present

## 2017-07-29 DIAGNOSIS — Z791 Long term (current) use of non-steroidal anti-inflammatories (NSAID): Secondary | ICD-10-CM | POA: Diagnosis not present

## 2017-07-29 DIAGNOSIS — Z8673 Personal history of transient ischemic attack (TIA), and cerebral infarction without residual deficits: Secondary | ICD-10-CM | POA: Diagnosis not present

## 2017-07-29 DIAGNOSIS — Z7984 Long term (current) use of oral hypoglycemic drugs: Secondary | ICD-10-CM | POA: Insufficient documentation

## 2017-07-29 DIAGNOSIS — G9389 Other specified disorders of brain: Secondary | ICD-10-CM | POA: Diagnosis not present

## 2017-07-29 DIAGNOSIS — I10 Essential (primary) hypertension: Secondary | ICD-10-CM | POA: Insufficient documentation

## 2017-07-29 DIAGNOSIS — E119 Type 2 diabetes mellitus without complications: Secondary | ICD-10-CM | POA: Diagnosis not present

## 2017-07-29 DIAGNOSIS — G8929 Other chronic pain: Secondary | ICD-10-CM | POA: Diagnosis not present

## 2017-07-29 DIAGNOSIS — Z79899 Other long term (current) drug therapy: Secondary | ICD-10-CM | POA: Diagnosis not present

## 2017-07-29 DIAGNOSIS — Z7982 Long term (current) use of aspirin: Secondary | ICD-10-CM | POA: Insufficient documentation

## 2017-07-29 DIAGNOSIS — I639 Cerebral infarction, unspecified: Secondary | ICD-10-CM | POA: Diagnosis present

## 2017-07-29 LAB — CBC
HEMATOCRIT: 39.7 % (ref 35.0–47.0)
HEMOGLOBIN: 13.1 g/dL (ref 12.0–16.0)
MCH: 30.3 pg (ref 26.0–34.0)
MCHC: 33 g/dL (ref 32.0–36.0)
MCV: 91.8 fL (ref 80.0–100.0)
Platelets: 260 10*3/uL (ref 150–440)
RBC: 4.32 MIL/uL (ref 3.80–5.20)
RDW: 15 % — ABNORMAL HIGH (ref 11.5–14.5)
WBC: 7 10*3/uL (ref 3.6–11.0)

## 2017-07-29 LAB — BASIC METABOLIC PANEL
Anion gap: 9 (ref 5–15)
BUN: 15 mg/dL (ref 6–20)
CHLORIDE: 103 mmol/L (ref 101–111)
CO2: 30 mmol/L (ref 22–32)
CREATININE: 0.71 mg/dL (ref 0.44–1.00)
Calcium: 9.7 mg/dL (ref 8.9–10.3)
GFR calc non Af Amer: >60 mL/min (ref >60)
Glucose, Bld: 118 mg/dL — ABNORMAL HIGH (ref 65–99)
Potassium: 4.3 mmol/L (ref 3.5–5.1)
Sodium: 142 mmol/L (ref 135–145)

## 2017-07-29 LAB — TROPONIN I: Troponin I: <0.03 ng/mL (ref <0.03)

## 2017-07-29 NOTE — ED Notes (Signed)
ED Provider at bedside. 

## 2017-07-29 NOTE — ED Triage Notes (Signed)
Pt reports she woke up this morning around 8am with left arm heaviness and numbness, reports symptom resolved it self, reports able to hold things with her left arm, daughter brought pt to ER

## 2017-07-30 ENCOUNTER — Encounter: Payer: Self-pay | Admitting: Internal Medicine

## 2017-07-30 ENCOUNTER — Observation Stay (HOSPITAL_BASED_OUTPATIENT_CLINIC_OR_DEPARTMENT_OTHER)
Admit: 2017-07-30 | Discharge: 2017-07-30 | Disposition: A | Discharge status: Home / Self Care | Payer: Medicare Other | Attending: Internal Medicine | Admitting: Internal Medicine

## 2017-07-30 ENCOUNTER — Emergency Department: Payer: Medicare Other

## 2017-07-30 ENCOUNTER — Observation Stay: Payer: Medicare Other

## 2017-07-30 DIAGNOSIS — I639 Cerebral infarction, unspecified: Secondary | ICD-10-CM

## 2017-07-30 DIAGNOSIS — I351 Nonrheumatic aortic (valve) insufficiency: Secondary | ICD-10-CM

## 2017-07-30 LAB — URINALYSIS, ROUTINE W REFLEX MICROSCOPIC
BILIRUBIN URINE: NEGATIVE
Glucose, UA: NEGATIVE mg/dL
HGB URINE DIPSTICK: NEGATIVE
Ketones, ur: NEGATIVE mg/dL
Leukocytes, UA: NEGATIVE
Nitrite: NEGATIVE
PROTEIN: NEGATIVE mg/dL
Specific Gravity, Urine: 1.005 (ref 1.005–1.030)
pH: 7 (ref 5.0–8.0)

## 2017-07-30 LAB — URINE DRUG SCREEN, QUALITATIVE (ARMC ONLY)
AMPHETAMINES, UR SCREEN: NOT DETECTED
BARBITURATES, UR SCREEN: NOT DETECTED
BENZODIAZEPINE, UR SCRN: NOT DETECTED
CANNABINOID 50 NG, UR ~~LOC~~: NOT DETECTED
Cocaine Metabolite,Ur ~~LOC~~: NOT DETECTED
MDMA (Ecstasy)Ur Screen: NOT DETECTED
Methadone Scn, Ur: NOT DETECTED
Opiate, Ur Screen: NOT DETECTED
PHENCYCLIDINE (PCP) UR S: NOT DETECTED
Tricyclic, Ur Screen: NOT DETECTED

## 2017-07-30 LAB — HEMOGLOBIN A1C
Hgb A1c MFr Bld: 6.7 % — ABNORMAL HIGH (ref 4.8–5.6)
MEAN PLASMA GLUCOSE: 145.59 mg/dL

## 2017-07-30 LAB — LIPID PANEL
CHOL/HDL RATIO: 3.1 ratio
CHOLESTEROL: 159 mg/dL (ref 0–200)
HDL: 52 mg/dL (ref >40)
LDL Cholesterol: 86 mg/dL (ref 0–99)
TRIGLYCERIDES: 106 mg/dL (ref <150)
VLDL: 21 mg/dL (ref 0–40)

## 2017-07-30 LAB — ECHOCARDIOGRAM COMPLETE
HEIGHTINCHES: 60 in
Weight: 2108.8 oz

## 2017-07-30 LAB — PROTIME-INR
INR: 0.94
PROTHROMBIN TIME: 12.5 s (ref 11.4–15.2)

## 2017-07-30 LAB — APTT: APTT: 34 s (ref 24–36)

## 2017-07-30 LAB — ETHANOL

## 2017-07-30 LAB — GLUCOSE, CAPILLARY
GLUCOSE-CAPILLARY: 175 mg/dL — AB (ref 65–99)
GLUCOSE-CAPILLARY: 113 mg/dL — AB (ref 65–99)
Glucose-Capillary: 110 mg/dL — ABNORMAL HIGH (ref 65–99)

## 2017-07-30 MED ORDER — ASPIRIN 81 MG PO CHEW
324.0000 mg | CHEWABLE_TABLET | Freq: Once | ORAL | Status: AC
  Administered 2017-07-30: 324 mg via ORAL
  Filled 2017-07-30: qty 4

## 2017-07-30 MED ORDER — DOXEPIN HCL 10 MG PO CAPS
10.0000 mg | ORAL_CAPSULE | Freq: Every day | ORAL | Status: DC
  Filled 2017-07-30: qty 1

## 2017-07-30 MED ORDER — ASPIRIN 300 MG RE SUPP: 300.0000 mg | Freq: Every day | RECTAL | Status: DC

## 2017-07-30 MED ORDER — STROKE: EARLY STAGES OF RECOVERY BOOK
Freq: Once | Status: AC
  Administered 2017-07-30: 03:00:00

## 2017-07-30 MED ORDER — IRBESARTAN 150 MG PO TABS
75.0000 mg | ORAL_TABLET | Freq: Every day | ORAL | Status: DC
  Administered 2017-07-30: 75 mg via ORAL
  Filled 2017-07-30: qty 1

## 2017-07-30 MED ORDER — INSULIN ASPART 100 UNIT/ML ~~LOC~~ SOLN: 0.0000 [IU] | Freq: Every day | SUBCUTANEOUS | Status: DC

## 2017-07-30 MED ORDER — ASPIRIN 325 MG PO TABS
325.0000 mg | ORAL_TABLET | Freq: Every day | ORAL | Status: DC
  Administered 2017-07-30: 11:00:00 325 mg via ORAL
  Filled 2017-07-30: qty 1

## 2017-07-30 MED ORDER — ASPIRIN 300 MG RE SUPP
RECTAL | Status: AC
  Filled 2017-07-30: qty 1

## 2017-07-30 MED ORDER — INSULIN ASPART 100 UNIT/ML ~~LOC~~ SOLN
0.0000 [IU] | Freq: Three times a day (TID) | SUBCUTANEOUS | Status: DC
  Administered 2017-07-30: 2 [IU] via SUBCUTANEOUS
  Filled 2017-07-30: qty 1

## 2017-07-30 MED ORDER — CLOPIDOGREL BISULFATE 75 MG PO TABS: 75.0000 mg | ORAL_TABLET | Freq: Every day | ORAL | 0 refills | Status: DC

## 2017-07-30 MED ORDER — BUTALBITAL-APAP-CAFFEINE 50-325-40 MG PO TABS: 1.0000 | ORAL_TABLET | ORAL | Status: DC | PRN

## 2017-07-30 MED ORDER — AMLODIPINE BESYLATE 5 MG PO TABS
2.5000 mg | ORAL_TABLET | Freq: Every day | ORAL | Status: DC
  Administered 2017-07-30: 11:00:00 2.5 mg via ORAL
  Filled 2017-07-30: qty 1

## 2017-07-30 MED ORDER — ASPIRIN 300 MG RE SUPP: 300.0000 mg | Freq: Once | RECTAL | Status: DC

## 2017-07-30 MED ORDER — GUAIFENESIN-CODEINE 100-10 MG/5ML PO SOLN: 5.0000 mL | Freq: Four times a day (QID) | ORAL | Status: DC | PRN

## 2017-07-30 MED ORDER — ASPIRIN EC 81 MG PO TBEC: 81.0000 mg | DELAYED_RELEASE_TABLET | Freq: Every day | ORAL | 0 refills | Status: DC

## 2017-07-30 MED ORDER — VITAMIN B-12 1000 MCG PO TABS
1000.0000 ug | ORAL_TABLET | Freq: Every day | ORAL | Status: DC
  Administered 2017-07-30: 11:00:00 1000 ug via ORAL
  Filled 2017-07-30: qty 1

## 2017-07-30 MED ORDER — VITAMIN D 1000 UNITS PO TABS
1000.0000 [IU] | ORAL_TABLET | Freq: Every day | ORAL | Status: DC
  Administered 2017-07-30: 1000 [IU] via ORAL
  Filled 2017-07-30: qty 1

## 2017-07-30 MED ORDER — ATORVASTATIN CALCIUM 20 MG PO TABS: 80.0000 mg | ORAL_TABLET | Freq: Every day | ORAL | Status: DC

## 2017-07-30 MED ORDER — ATORVASTATIN CALCIUM 80 MG PO TABS: 80.0000 mg | ORAL_TABLET | Freq: Every day | ORAL | 0 refills | Status: DC

## 2017-07-30 NOTE — Progress Notes (Signed)
Discussed discharge instructions and medications with patient and her daughter [name]. IV removed. All questions addressed. Patient transported home via car by her daughter.  Clarise Cruz, RN

## 2017-07-30 NOTE — Progress Notes (Signed)
Clinical Education officer, museum (CSW) received consult that patient lives home alone. PT is pending. CSW will continue to follow and assist as needed.   [employer], LCSW [phone]

## 2017-07-30 NOTE — Progress Notes (Signed)
Initial Nutrition Assessment  DOCUMENTATION CODES:   Not applicable  INTERVENTION:  Encouraged patient to increase to two servings daily of her Complete by Juice Plus protein shakes, each bottle provides 140 kcal and 13 grams of protein.  Encouraged adequate intake of calories and protein at meals to prevent unintentional weight loss/loss of lean body mass.  If patient remains admitted would recommend Ensure Enlive po BID, each supplement provides 350 kcal and 20 grams of protein.  NUTRITION DIAGNOSIS:   Inadequate oral intake related to poor appetite as evidenced by per patient/family report.  GOAL:   Patient will meet greater than or equal to 90% of their needs  MONITOR:   PO intake, Supplement acceptance, Labs, Weight trends, I & O's  REASON FOR ASSESSMENT:   Malnutrition Screening Tool    ASSESSMENT:   83 year old female with PMHx of DM type 2, shingles, HLD, hx CVA who presented with weakness and heaviness in left arm found to have small acute right prefrontal gyrus infarct.   -Discharge order was put in after nutrition assessment.  Spoke with patient at bedside. She reports that for the past month she has had a decreased appetite. She has been forcing herself to eat meals. She lives alone in a condominium but reports her daughter is very close. She prepares her meals herself and is independent in her ADLs. She prepares 2-3 meals daily for herself. For breakfast she likes to have Raison Bran with fresh fruit. For lunch she may have a sandwich. For dinner she may have some meat with sides. Lately she has only been eating about 50% of her meals. For lunch today she had about 50% of her beef tips and sweet potato, and bites of her green beans. She has saved her applesauce and crackers for a snack later. She drinks a Complete by Juice Plus protein shake daily (140 kcal, 13 grams of protein) and may blend in some fruit. She denies any N/V, abdominal pain, or difficulty  chewing/swallowing. Her feeling has come back to her left arm and she is able to move it better even though it still feels a little heavy.  UBW 130 lbs. Patient reports she has lost 5 lbs (3.8% body weight) over the past month, which is not significant for time frame. However, admission weight of 131.8 lbs is right at UBW, so unsure if patient has truly lost weight.  Medications reviewed and include: vitamin D 1000 units daily, Novolog 0-9 units TID, Novolog 0-5 units QHS, vitamin B12 1000 micrograms daily PO.  Labs reviewed: CBG 110-175, HgbA1c 6.7.  Nutrition-Focused physical exam completed. Findings are mild fat depletion in upper arm region, no muscle depletion, and no edema.  Patient does not meet criteria for malnutrition at this time.   Diet Order:  Diet Carb Modified Fluid consistency: Thin; Room service appropriate? Yes Diet - low sodium heart healthy  Skin:  Reviewed, no issues  Last BM:  07/30/2017 - small type 3  Height:   Ht Readings from Last 1 Encounters:  07/30/17 5' (1.524 m)    Weight:   Wt Readings from Last 1 Encounters:  07/30/17 131 lb 12.8 oz (59.8 kg)    Ideal Body Weight:  45.5 kg  BMI:  Body mass index is 25.74 kg/m.  Estimated Nutritional Needs:   Kcal:  3009-2330 (25-30 kcal/kg)  Protein:  72-84 grams (1.2-1.4 grams/kg)  Fluid:  1.5 L/day (25 ml/kg)  EDUCATION NEEDS:   No education needs identified at this time  Edwar Coe, MS, RD, LDN Office: [phone] Pager: [phone] After Hours/Weekend Pager: [phone]  

## 2017-07-30 NOTE — Evaluation (Signed)
Physical Therapy Evaluation Patient Details Name: Destiny [name] MRN: [id number] DOB: [birth year] Today's Date: 07/30/2017   History of Present Illness  Pt is a 83 y/o F who presented with LUE weakness and heaviness as well as some numbness in L arm.  MRI brain revealed subacute infarct in the right prefrontal gyrus.  Pt's PMH includes stroke, shingles, back surgery, foot surgery.    Clinical Impression  Pt admitted with above diagnosis. LUE weakness noted and pt pending OT Evaluation.  Pt's LUE numbness appear to have resolved. No sign of coordination impairments.  BLE assessment WNL and pt independent with all aspects of mobility.  No skilled PT needs identified, PT will sign off.     Follow Up Recommendations No PT follow up    Equipment Recommendations  None recommended by PT    Recommendations for Other Services OT consult     Precautions / Restrictions Precautions Precautions: None Restrictions Weight Bearing Restrictions: No      Mobility  Bed Mobility               General bed mobility comments: Pt sitting in chair at start and end of session  Transfers Overall transfer level: Independent Equipment used: None             General transfer comment: No physical assist or cues needed.  No instability noted.   Ambulation/Gait Ambulation/Gait assistance: Independent Ambulation Distance (Feet): 80 Feet Assistive device: None Gait Pattern/deviations: Decreased stride length Gait velocity: mildly decreased   General Gait Details: Mildly decreased gait speed but pt steady and no signs of instability.    Stairs            Wheelchair Mobility    Modified Rankin (Stroke Patients Only)       Balance Overall balance assessment: Needs assistance Sitting-balance support: No upper extremity supported;Feet supported Sitting balance-Leahy Scale: Normal     Standing balance support: No upper extremity supported;During functional activity Standing  balance-Leahy Scale: Good                               Pertinent Vitals/Pain Pain Assessment: No/denies pain    Home Living Family/patient expects to be discharged to:: Private residence Living Arrangements: Alone Available Help at Discharge: Family;Available PRN/intermittently Type of Home: Other(Comment) (Condo) Home Access: Level entry     Home Layout: One level Home Equipment: Cane - single point;Shower seat - built in      Prior Function Level of Independence: Independent         Comments: Pt denies any falls in the past 6 months.  She ambulates without AD.  Pt independent with all aspects of mobility and is still driving.      Hand Dominance   Dominant Hand: Right    Extremity/Trunk Assessment   Upper Extremity Assessment Upper Extremity Assessment: Defer to OT evaluation (L shoulder F and elbow E 4-/5)    Lower Extremity Assessment Lower Extremity Assessment: Overall WFL for tasks assessed       Communication   Communication: No difficulties  Cognition Arousal/Alertness: Awake/alert Behavior During Therapy: WFL for tasks assessed/performed Overall Cognitive Status: Within Functional Limits for tasks assessed                                        General Comments General comments (skin integrity, edema,  etc.): Reviewed signs and symptoms of a stroke with the pt and daughter and what to do if these are noted.  Pt and daughter verbalized understanding.     Exercises Other Exercises Other Exercises: Encouraged pt in ambulate in hallway with nursing staff at least 3x/day   Assessment/Plan    PT Assessment Patent does not need any further PT services  PT Problem List         PT Treatment Interventions      PT Goals (Current goals can be found in the Care Plan section)  Acute Rehab PT Goals Patient Stated Goal: to go home PT Goal Formulation: All assessment and education complete, DC therapy    Frequency      Barriers to discharge        Co-evaluation               AM-PAC PT "6 Clicks" Daily Activity  Outcome Measure Difficulty turning over in bed (including adjusting bedclothes, sheets and blankets)?: None Difficulty moving from lying on back to sitting on the side of the bed? : None Difficulty sitting down on and standing up from a chair with arms (e.g., wheelchair, bedside commode, etc,.)?: None Help needed moving to and from a bed to chair (including a wheelchair)?: None Help needed walking in hospital room?: None Help needed climbing 3-5 steps with a railing? : None 6 Click Score: 24    End of Session Equipment Utilized During Treatment: Gait belt Activity Tolerance: Patient tolerated treatment well Patient left: in chair;with call bell/phone within reach;with family/visitor present Nurse Communication: Mobility status PT Visit Diagnosis: Muscle weakness (generalized) (M62.81)    Time: 3491-7915 PT Time Calculation (min) (ACUTE ONLY): 33 min   Charges:   PT Evaluation $PT Eval Low Complexity: 1 Low     PT G Codes:   PT G-Codes **NOT FOR INPATIENT CLASS** Functional Assessment Tool Used: AM-PAC 6 Clicks Basic Mobility;Clinical judgement Functional Limitation: Mobility: Walking and moving around Mobility: Walking and Moving Around Current Status (A5697): At least 1 percent but less than 20 percent impaired, limited or restricted Mobility: Walking and Moving Around Goal Status [phone]): At least 1 percent but less than 20 percent impaired, limited or restricted Mobility: Walking and Moving Around Discharge Status [phone]): At least 1 percent but less than 20 percent impaired, limited or restricted    Collie Siad PT, DPT 07/30/2017, 1:10 PM

## 2017-07-30 NOTE — H&P (Signed)
[place] [facility] [place] NAME: Destiny [name]    MR#:  [id number]  DATE OF BIRTH:  12-Jul-1924  DATE OF ADMISSION:  07/29/2017  PRIMARY CARE PHYSICIAN: Derinda Late, MD   REQUESTING/REFERRING PHYSICIAN:   CHIEF COMPLAINT:   Chief Complaint  Patient presents with  . Numbness    HISTORY OF PRESENT ILLNESS: Destiny [name]  is a 83 y.o. female with a known history of hyperlipidemia, CVA, shingles, diabetes mellitus type 2 presented to the emergency room with weakness and heaviness in the left arm. Left arm weakness and heaviness started yesterday morning after patient woke up from sleep. No complaints of any slurred speech. No complaints of any tingling sensation in any part of the body. She also felt some numbness in the left arm. Patient was evaluated in the emergency room with CT head and MRI brain as part of stroke workup. MRI brain revealed subacute infarct in the right prefrontal gyrus.hospitalist service was consulted for further care.  PAST MEDICAL HISTORY:   Past Medical History:  Diagnosis Date  . Allergy   . Diabetes mellitus without complication ([place])   . Hyperlipidemia   . Shingles   . Stroke [facility])     PAST SURGICAL HISTORY: Past Surgical History:  Procedure Laterality Date  . APPENDECTOMY    . BACK SURGERY    . FOOT SURGERY Bilateral    [place]  . JOINT REPLACEMENT    . KNEE SURGERY Bilateral   . TONSILECTOMY, ADENOIDECTOMY, BILATERAL MYRINGOTOMY AND TUBES      SOCIAL HISTORY:  Social History  Substance Use Topics  . Smoking status: Never Smoker  . Smokeless tobacco: Never Used  . Alcohol use No    FAMILY HISTORY:  Family History  Problem Relation Age of Onset  . Stroke Mother   . Stroke Father     DRUG ALLERGIES:  Allergies  Allergen Reactions  . Penicillins     Other reaction(s): Other (See Comments) Other reaction  . Ampicillin Rash  . Hydrocodone-Homatropine     Other reaction(s): Other (See  Comments) tachycardia  . Prednisone     Other reaction(s): Other (See Comments) "mind spins"  . Amoxicillin Rash  . Tramadol Itching    REVIEW OF SYSTEMS:   CONSTITUTIONAL: No fever, fatigue or weakness.  EYES: No blurred or double vision.  EARS, NOSE, AND THROAT: No tinnitus or ear pain.  RESPIRATORY: No cough, shortness of breath, wheezing or hemoptysis.  CARDIOVASCULAR: No chest pain, orthopnea, edema.  GASTROINTESTINAL: No nausea, vomiting, diarrhea or abdominal pain.  GENITOURINARY: No dysuria, hematuria.  ENDOCRINE: No polyuria, nocturia,  HEMATOLOGY: No anemia, easy bruising or bleeding SKIN: No rash or lesion. MUSCULOSKELETAL: No joint pain or arthritis.   NEUROLOGIC: No tingling,  left arm numbness, weakness present PSYCHIATRY: No anxiety or depression.   MEDICATIONS AT HOME:  Prior to Admission medications   Medication Sig Start Date End Date Taking? Authorizing Provider  amLODipine (NORVASC) 2.5 MG tablet Take 1 tablet (2.5 mg total) by mouth daily. Take once daily if blood pressure > 160 10/04/16 10/04/17  Merlyn Lot, MD  aspirin (GOODSENSE ASPIRIN) 325 MG tablet Take 325 mg by mouth.    [provider]  butalbital-acetaminophen-caffeine (FIORICET, [place]) 50-325-40 MG tablet TAKE ONE TABLET BY MOUTH EVERY 4 HOURS AS NEEDED 08/08/15   [provider]  cetirizine (ZYRTEC) 10 MG tablet Take by mouth. 08/31/15 08/30/16  [provider]  Cholecalciferol (VITAMIN D3) 1000 units CAPS Take 1,000  Units by mouth daily.     [provider]  Cyanocobalamin (RA VITAMIN B-12 TR) 1000 MCG TBCR Take 1,000 mcg by mouth daily.     [provider]  doxepin (SINEQUAN) 10 MG capsule TAKE ONE CAPSULE BY MOUTH EVERY NIGHT AT BEDTIME 07/30/16   [provider]  fluticasone (FLONASE) 50 MCG/ACT nasal spray Place into the nose as needed.  08/31/15 08/30/16  [provider]  glucose blood (ONE TOUCH ULTRA TEST) test strip  EVERY DAY 04/15/16   [provider]  guaiFENesin-codeine (ROBITUSSIN AC) 100-10 MG/5ML syrup 1 TSP EVERY 8 HRS. PRN 01/19/16   [provider]  ibuprofen (ADVIL,MOTRIN) 200 MG tablet Take by mouth as needed.     [provider]  metFORMIN (GLUCOPHAGE) 500 MG tablet TAKE ONE (1) TABLET BY MOUTH EVERY DAY 07/01/16   [provider]  Jonetta Speak LANCETS FINE [place] 04/15/16   [provider]  Probiotic Product (SOLUBLE FIBER/PROBIOTICS PO) Take 1 tablet by mouth daily.    [provider]  telmisartan (MICARDIS) 80 MG tablet 80 mg daily.  07/30/16   [provider]  UNABLE TO FIND Juice plus(powder)-supplement taken daily with liquid    [provider]      PHYSICAL EXAMINATION:   VITAL SIGNS: Blood pressure (!) 182/76, pulse 70, temperature 97.9 F (36.6 C), resp. rate 18, height 5' (1.524 m), weight 56.7 kg (125 lb), SpO2 98 %.  GENERAL:  83 y.o.-year-old patient lying in the bed with no acute distress.  EYES: Pupils equal, round, reactive to light and accommodation. No scleral icterus. Extraocular muscles intact.  HEENT: Head atraumatic, normocephalic. Oropharynx and nasopharynx clear.  NECK:  Supple, no jugular venous distention. No thyroid enlargement, no tenderness.  LUNGS: Normal breath sounds bilaterally, no wheezing, rales,rhonchi or crepitation. No use of accessory muscles of respiration.  CARDIOVASCULAR: S1, S2 normal. No murmurs, rubs, or gallops.  ABDOMEN: Soft, nontender, nondistended. Bowel sounds present. No organomegaly or mass.  EXTREMITIES: No pedal edema, cyanosis, or clubbing.  NEUROLOGIC: Cranial nerves II through XII are intact. Muscle strength 3/5 in left uppper extremity.  Power 5/5 all other extremities. No cerebellar signs noted. Sensation intact. Gait not checked.  PSYCHIATRIC: The patient is alert and oriented x 3.  SKIN: No obvious rash, lesion, or ulcer.   LABORATORY PANEL:    CBC  Recent Labs Lab 07/29/17 2126  WBC 7.0  HGB 13.1  HCT 39.7  PLT 260  MCV 91.8  MCH 30.3  MCHC 33.0  RDW 15.0*   ------------------------------------------------------------------------------------------------------------------  Chemistries   Recent Labs Lab 07/29/17 2126  NA 142  K 4.3  CL 103  CO2 30  GLUCOSE 118*  BUN 15  CREATININE 0.71  CALCIUM 9.7   ------------------------------------------------------------------------------------------------------------------ estimated creatinine clearance is 36.2 mL/min (by C-G formula based on SCr of 0.71 mg/dL). ------------------------------------------------------------------------------------------------------------------ No results for input(s): TSH, T4TOTAL, T3FREE, THYROIDAB in the last 72 hours.  Invalid input(s): FREET3   Coagulation profile No results for input(s): INR, PROTIME in the last 168 hours. ------------------------------------------------------------------------------------------------------------------- No results for input(s): DDIMER in the last 72 hours. -------------------------------------------------------------------------------------------------------------------  Cardiac Enzymes  Recent Labs Lab 07/29/17 2126  TROPONINI <0.03   ------------------------------------------------------------------------------------------------------------------ Invalid input(s): POCBNP  ---------------------------------------------------------------------------------------------------------------  Urinalysis No results found for: COLORURINE, APPEARANCEUR, LABSPEC, PHURINE, GLUCOSEU, HGBUR, BILIRUBINUR, KETONESUR, PROTEINUR, UROBILINOGEN, NITRITE, LEUKOCYTESUR   RADIOLOGY: Ct Head Wo Contrast  Result Date: 07/29/2017 CLINICAL DATA:  Patient woke up this morning with left arm heaviness and numbness. Symptoms  resolved. EXAM: CT HEAD WITHOUT CONTRAST TECHNIQUE: Contiguous axial images were  obtained from the base of the skull through the vertex without intravenous contrast. COMPARISON:  10/04/2016 FINDINGS: Brain: Diffuse cerebral atrophy. Ventricular dilatation consistent with central atrophy. Patchy low-attenuation changes in the deep [place] matter consistent small vessel ischemia. Encephalomalacia in the left parietal region extends to the cortex consistent with old infarct. No change since prior study. No mass effect or midline shift. No abnormal extra-axial fluid collections. Gray-Masser matter junctions are distinct. Basal cisterns are not effaced. No evidence of acute intracranial hemorrhage. Vascular: Internal carotid artery and vertebrobasilar artery vascular calcifications. Skull: The calvarium appears intact. Sinuses/Orbits: Paranasal sinuses and mastoid air cells are clear Other: None. IMPRESSION: No acute intracranial abnormalities. Chronic atrophy and small vessel ischemic changes. Old left parietal infarct. No change since prior study. Electronically Signed   By: Lucienne Capers M.D.   On: 07/29/2017 22:01   Mr Brain Wo Contrast  Result Date: 07/30/2017 CLINICAL DATA:  83 y/o F; transient left arm heaviness and numbness. EXAM: MRI HEAD WITHOUT CONTRAST TECHNIQUE: Multiplanar, multiecho pulse sequences of the brain and surrounding structures were obtained without intravenous contrast. COMPARISON:  07/29/2017 CT head. FINDINGS: Brain: Subcentimeter focus of reduced diffusion within the right superior prefrontal gyrus (series 100, image 41). Chronic left parietal infarction. Several small chronic infarctions in the right greater than left cerebellar hemispheres. T2 hyperintense foci in the basal ganglia may represent prominent perivascular spaces and/or chronic lacunar infarctions. Few nonspecific foci of T2 FLAIR hyperintense signal abnormality in subcortical and periventricular Lindeman matter are compatible with mild chronic microvascular ischemic changes for age. Mild brain parenchymal  volume loss. Vascular: Normal flow voids. Skull and upper cervical spine: Normal marrow signal. Sinuses/Orbits: Mild maxillary sinus mucosal thickening and small right maxillary sinus mucous retention cyst. Opacification of posterior ethmoid air cells. Partial opacification of left-greater-than-right mastoid air cells. Bilateral intra-ocular lens replacement. Other: None. IMPRESSION: 1. Subcentimeter acute/early subacute infarction within right superior prefrontal gyrus. No hemorrhage or mass effect. 2. Stable mild for age chronic microvascular ischemic changes and mild parenchymal volume loss of the brain. 3. Stable chronic infarctions as above. 4. Mild paranasal sinus disease. Electronically Signed   By: Kristine Garbe M.D.   On: 07/30/2017 01:01    EKG: Orders placed or performed during the hospital encounter of 07/29/17  . ED EKG  . ED EKG  . EKG 12-Lead  . EKG 12-Lead    IMPRESSION AND PLAN: 83 year old female patient with history of CVA, hypertension, hyperlipidemia presented to the emergency room with left arm heaviness and weakness. MRI brain revealed subacute infarct in the right 3 frontal gyrus  Admitting diagnosis 1. Subacute CVA 2. Hypertension 3. Hyperlipidemia 4.diabetes mellitus Treatment plan Admit patient to medical floor observation bed Aspirin 325 MG daily Neurology consultation Physical therapy evaluation Control blood pressure with calcium channel blocker Medical management for diabetes mellitus  All the records are reviewed and case discussed with ED provider. Management plans discussed with the patient, family and they are in agreement.  CODE STATUS:FULL CODE Code Status History    This patient does not have a recorded code status. Please follow your organizational policy for patients in this situation.    Advance Directive Documentation     Most Recent Value  Type of Advance Directive  Healthcare Power of Attorney  Pre-existing out of facility  DNR order (yellow form or pink MOST form)  -  "MOST" Form in Place?  -  TOTAL TIME TAKING CARE OF THIS PATIENT: 51 minutes.    Saundra Shelling M.D on 07/30/2017 at 2:19 AM  Between 7am to 6pm - Pager - [phone]  After 6pm go to [email or web address] - password [email or web address] [email or web address]  [facility]  Office  [phone]  CC: Primary care physician; Derinda Late, MD

## 2017-07-30 NOTE — Progress Notes (Signed)
*  PRELIMINARY RESULTS* Echocardiogram 2D Echocardiogram has been performed.  Sherrie Sport 07/30/2017, 10:02 AM

## 2017-07-30 NOTE — Progress Notes (Signed)
SLP Cancellation Note  Patient Details Name: SARIE STALL MRN: [id number] DOB: [birth year]   Cancelled treatment:       Reason Eval/Treat Not Completed: SLP screened, no needs identified, will sign off (chart reviewed; consulted NSG and pt/family)  Pt denied any difficulty swallowing and is currently on a regular diet; tolerates swallowing pills w/ water per NSG. Pt conversed at conversational level w/out deficits noted; pt and family denied any speech-language deficits during the event.  No further skilled ST services indicated as pt appears at her baseline. Pt agreed. NSG to reconsult if any change in status.    Orinda Kenner, MS, CCC-SLP Mouhamad Teed 07/30/2017, 12:00 PM

## 2017-07-30 NOTE — ED Provider Notes (Signed)
[facility] Emergency Department Provider Note   ____________________________________________   First MD Initiated Contact with Patient 07/29/17 2313     (approximate)  I have reviewed the triage vital signs and the nursing notes.   HISTORY  Chief Complaint Numbness    HPI Destiny [name] is a 83 y.o. female who comes into the hospital today with some left arm heaviness. The patient reports that she woke up this morning and didn't feel right. She reports that she reached up to get something with the left arm off of the dresser and she was unable to do so. She had used her right arm to guide her left arm. She didn't call her daughter and her daughter states that she didn't speak to her mother yesterday in the morning. She reports that as the day went on the hand seemed to get better but her arm still didn't feel right. She reports that it felt very heavy. By the time the patient's daughter spoke with her the daughter became concerned. She had her touch her nose with both hands and reports that it wasn't the same as she decided to bring her in to get checked out. The patient has not had any slurred speech or difficulty walking. She was sick with a virus last week and had fevers and has still been recovering. She's had some fatigue and decreased appetite. The patient was last normal on 07/28/17 at 11 PM. She denies any chest pain, shortness breath, blurred vision or dizziness. She is here today for evaluation.   Past Medical History:  Diagnosis Date  . Allergy   . Diabetes mellitus without complication ([place])   . Hyperlipidemia   . Shingles   . Stroke [facility])     Patient Active Problem List   Diagnosis Date Noted  . CVA (cerebral vascular accident) ([place]) 07/30/2017  . Myofascial pain syndrome 08/01/2016  . Chronic left sacroiliac pain 08/01/2016  . Esophageal reflux 03/01/2014  . Benign essential hypertension 03/01/2014  . Migraine 03/01/2014  . Other and  unspecified hyperlipidemia 03/01/2014  . Type II or unspecified type diabetes mellitus without mention of complication, not stated as uncontrolled 03/01/2014  . Pseudophakia, both eyes 10/30/2012  . 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 APPENDECTOMY    . BACK SURGERY    . FOOT SURGERY Bilateral    [place]  . JOINT REPLACEMENT    . KNEE SURGERY Bilateral   . TONSILECTOMY, ADENOIDECTOMY, BILATERAL MYRINGOTOMY AND TUBES      Prior to Admission medications   Medication Sig Start Date End Date Taking? Authorizing Provider  albuterol (PROVENTIL HFA) 108 (90 Base) MCG/ACT inhaler Inhale 2 puffs into the lungs every 6 (six) hours as needed. 07/22/17 07/22/18 Yes [provider]  amLODipine (NORVASC) 2.5 MG tablet Take 1 tablet (2.5 mg total) by mouth daily. Take once daily if blood pressure > 160 10/04/16 10/04/17 Yes Merlyn Lot, MD  aspirin (GOODSENSE ASPIRIN) 325 MG tablet Take 325 mg by mouth.   Yes [provider]  butalbital-acetaminophen-caffeine (FIORICET, ESGIC) 50-325-40 MG tablet TAKE ONE TABLET BY MOUTH EVERY 4 HOURS AS NEEDED 08/08/15  Yes [provider]  Cholecalciferol (VITAMIN D3) 1000 units CAPS Take 1,000 Units by mouth daily.    Yes [provider]  Cyanocobalamin (RA VITAMIN B-12 TR) 1000 MCG TBCR Take 1,000 mcg by mouth daily.    Yes [provider]  doxepin (SINEQUAN) 10 MG capsule  TAKE ONE CAPSULE BY MOUTH EVERY NIGHT AT BEDTIME 07/30/16  Yes [provider]  fluticasone (FLONASE) 50 MCG/ACT nasal spray Place into the nose as needed.  08/31/15 07/30/17 Yes [provider]  glucose blood (ONE TOUCH ULTRA TEST) test strip EVERY DAY 04/15/16  Yes [provider]  ibuprofen (ADVIL,MOTRIN) 200 MG tablet Take by mouth as needed.    Yes [provider]  metFORMIN (GLUCOPHAGE) 500 MG tablet TAKE ONE (1) TABLET BY MOUTH EVERY DAY 07/01/16   Yes [provider]  ONETOUCH DELICA LANCETS FINE MISC EVERY DAY 04/15/16  Yes [provider]  Probiotic Product (SOLUBLE FIBER/PROBIOTICS PO) Take 1 tablet by mouth daily.   Yes [provider]  telmisartan (MICARDIS) 80 MG tablet 80 mg daily.  07/30/16  Yes [provider]  UNABLE TO FIND Juice plus(powder)-supplement taken daily with liquid   Yes [provider]  predniSONE (DELTASONE) 20 MG tablet Take 20 mg by mouth daily. 07/22/17 08/01/17  [provider]    Allergies Penicillins; Ampicillin; Hydrocodone-homatropine; Prednisone; Amoxicillin; and Tramadol  Family History  Problem Relation Age of Onset  . Stroke Mother   . Stroke Father     Social History Social History  Substance Use Topics  . Smoking status: Never Smoker  . Smokeless tobacco: Never Used  . Alcohol use No    Review of Systems  Constitutional: No fever/chills Eyes: No visual changes. ENT: No sore throat. Cardiovascular: Denies chest pain. Respiratory: Denies shortness of breath. Gastrointestinal: No abdominal pain.  No nausea, no vomiting.  No diarrhea.  No constipation. Genitourinary: Negative for dysuria. Musculoskeletal: Negative for back pain. Skin: Negative for rash. Neurological: left arm weakness and numbness   ____________________________________________   PHYSICAL EXAM:  VITAL SIGNS: ED Triage Vitals  Enc Vitals Group     BP 07/29/17 2120 (!) 175/70     Pulse Rate 07/29/17 2120 84     Resp 07/29/17 2120 20     Temp 07/29/17 2120 98 F (36.7 C)     Temp Source 07/29/17 2120 Oral     SpO2 07/29/17 2120 98 %     Weight 07/29/17 2121 125 lb (56.7 kg)     Height 07/29/17 2121 5' (1.524 m)     Head Circumference --      Peak Flow --      Pain Score --      Pain Loc --      Pain Edu? --      Excl. in [place]? --     Constitutional: Alert and oriented. Well appearing and in no acute distress. Eyes: Conjunctivae are normal. PERRL.  EOMI. Head: Atraumatic. Nose: No congestion/rhinnorhea. Mouth/Throat: Mucous membranes are moist.  Oropharynx non-erythematous. Cardiovascular: Normal rate, regular rhythm. Grossly normal heart sounds.  Good peripheral circulation. Respiratory: Normal respiratory effort.  No retractions. Lungs CTAB. Gastrointestinal: Soft and nontender. No distention. Positive bowel sounds Musculoskeletal: No lower extremity tenderness nor edema.   Neurologic:  Normal speech and language. cranial nerves II through XII are grossly intact with no focal motor or neuro deficits. Patient has some minimal pronator drift with no difficulty with alternating movements or finger-to-nose. Patient's strength is 5 out of 5 in upper and lower extremities. Skin:  Skin is warm, dry and intact.  Psychiatric: Mood and affect are normal.   ____________________________________________   LABS (all labs ordered are listed, but only abnormal results are displayed)  Labs Reviewed  CBC - Abnormal; Notable for the following:  Result Value   RDW 15.0 (*)    All other components within normal limits  BASIC METABOLIC PANEL - Abnormal; Notable for the following:    Glucose, Bld 118 (*)    All other components within normal limits  URINALYSIS, ROUTINE W REFLEX MICROSCOPIC - Abnormal; Notable for the following:    Color, Urine STRAW (*)    APPearance CLEAR (*)    All other components within normal limits  TROPONIN I  ETHANOL  PROTIME-INR  APTT  URINE DRUG SCREEN, QUALITATIVE ([facility] ONLY)  HEMOGLOBIN A1C  LIPID PANEL   ____________________________________________  EKG  ED ECG REPORT I, Loney Hering, the attending physician, personally viewed and interpreted this ECG.   Date: 07/29/2017  EKG Time: 2122  Rate: 73  Rhythm: normal sinus rhythm  Axis: normal  Intervals:none  ST&T Change: none  ____________________________________________  RADIOLOGY  Ct Head Wo Contrast  Result Date:  07/29/2017 CLINICAL DATA:  Patient woke up this morning with left arm heaviness and numbness. Symptoms resolved. EXAM: CT HEAD WITHOUT CONTRAST TECHNIQUE: Contiguous axial images were obtained from the base of the skull through the vertex without intravenous contrast. COMPARISON:  10/04/2016 FINDINGS: Brain: Diffuse cerebral atrophy. Ventricular dilatation consistent with central atrophy. Patchy low-attenuation changes in the deep Adcock matter consistent small vessel ischemia. Encephalomalacia in the left parietal region extends to the cortex consistent with old infarct. No change since prior study. No mass effect or midline shift. No abnormal extra-axial fluid collections. Gray-Tuzzolino matter junctions are distinct. Basal cisterns are not effaced. No evidence of acute intracranial hemorrhage. Vascular: Internal carotid artery and vertebrobasilar artery vascular calcifications. Skull: The calvarium appears intact. Sinuses/Orbits: Paranasal sinuses and mastoid air cells are clear Other: None. IMPRESSION: No acute intracranial abnormalities. Chronic atrophy and small vessel ischemic changes. Old left parietal infarct. No change since prior study. Electronically Signed   By: Lucienne Capers M.D.   On: 07/29/2017 22:01   Mr Brain Wo Contrast  Result Date: 07/30/2017 CLINICAL DATA:  83 y/o F; transient left arm heaviness and numbness. EXAM: MRI HEAD WITHOUT CONTRAST TECHNIQUE: Multiplanar, multiecho pulse sequences of the brain and surrounding structures were obtained without intravenous contrast. COMPARISON:  07/29/2017 CT head. FINDINGS: Brain: Subcentimeter focus of reduced diffusion within the right superior prefrontal gyrus (series 100, image 41). Chronic left parietal infarction. Several small chronic infarctions in the right greater than left cerebellar hemispheres. T2 hyperintense foci in the basal ganglia may represent prominent perivascular spaces and/or chronic lacunar infarctions. Few nonspecific foci of  T2 FLAIR hyperintense signal abnormality in subcortical and periventricular Molock matter are compatible with mild chronic microvascular ischemic changes for age. Mild brain parenchymal volume loss. Vascular: Normal flow voids. Skull and upper cervical spine: Normal marrow signal. Sinuses/Orbits: Mild maxillary sinus mucosal thickening and small right maxillary sinus mucous retention cyst. Opacification of posterior ethmoid air cells. Partial opacification of left-greater-than-right mastoid air cells. Bilateral intra-ocular lens replacement. Other: None. IMPRESSION: 1. Subcentimeter acute/early subacute infarction within right superior prefrontal gyrus. No hemorrhage or mass effect. 2. Stable mild for age chronic microvascular ischemic changes and mild parenchymal volume loss of the brain. 3. Stable chronic infarctions as above. 4. Mild paranasal sinus disease. Electronically Signed   By: Kristine Garbe M.D.   On: 07/30/2017 01:01    ____________________________________________   PROCEDURES  Procedure(s) performed: None  Procedures  Critical Care performed: No  ____________________________________________   INITIAL IMPRESSION / ASSESSMENT AND PLAN / ED COURSE  As part of my medical decision  making, I reviewed the following data within the electronic MEDICAL RECORD NUMBER Notes from prior ED visits and [place] Controlled Substance Database   This is a 83 year old female who comes into the hospital today with some left-sided arm numbness and weakness. The patient was last seen normal last night at approximately 11:30 and her symptoms were discovered this morning. The patient comes into the hospital for evaluation.  The differential diagnosis includes TIA, CVA, resume her reemergence of previous CVA.  The patient had some blood work as well as a CT scan done. The patient's CT scan was significant for chronic atrophy and small vessel ischemic changes. The patient also had an old parietal left  infarct. Given the patient's symptoms I did decide to perform an MRI looking for new or lesion. The patient's MRI showed a subcentimeter acute/early subacute infarction within the right superior prefrontal gyrus. given this finding I decided to admit the patient to the hospitalist service for further evaluation. The patient had a stroke swallow study which was negative and was given some aspirin. The patient will be admitted to the hospitalist service. The patient is outside of the window for any intervention at this time.      ____________________________________________   FINAL CLINICAL IMPRESSION(S) / ED DIAGNOSES  Final diagnoses:  Cerebrovascular accident (CVA), unspecified mechanism ([place])      NEW MEDICATIONS STARTED DURING THIS VISIT:  Current Discharge Medication List       Note:  This document was prepared using Dragon voice recognition software and may include unintentional dictation errors.    Loney Hering, MD 07/30/17 [phone]

## 2017-07-30 NOTE — Care Management Note (Addendum)
Case Management Note  Patient Details  Name: [name] MRN: [id number] Date of Birth: 1924/08/07  Subjective/Objective:   Admitted to [facility] under observation status with the diagnosis of TIA. Lives alone. Daughter is [name] [phone]). Seen Dr. Loney Hering a week ago. Prescriptions are filled at [facility]. Home Health many years ago, doesn't remember name of agency. No skilled nursing. No home oxygen. Cane, rolling walker, and shower seat in the home. Takes care of all basic and instrumental activities of daily living herself, drives. Last fall was 5 years ago. Decreased appetite. Daughter will transport.                  Action/Plan: Physical therapy evaluation completed. No follow-up needs. Occupational therapy evaluation completed. Will give exercises to take home.   Expected Discharge Date:  07/31/17               Expected Discharge Plan:     In-House Referral:     Discharge planning Services     Post Acute Care Choice:    Choice offered to:     DME Arranged:    DME Agency:     HH Arranged:    HH Agency:     Status of Service:     If discussed at [employer], dates discussed:    Additional Comments:  Shelbie Ammons, RN MSN CCM Care Management [phone] 07/30/2017, 2:01 PM

## 2017-07-30 NOTE — Progress Notes (Signed)
PT is recommending no follow up. Please reconsult if future social work needs arise. CSW signing off.   [employer], LCSW [phone]

## 2017-07-30 NOTE — Discharge Summary (Signed)
[facility] [place] NAME: Destiny [name]    MR#:  [id number]  DATE OF BIRTH:  1923-11-25  DATE OF ADMISSION:  07/29/2017 ADMITTING PHYSICIAN: Saundra Shelling, MD  DATE OF DISCHARGE: 07/30/2017   PRIMARY CARE PHYSICIAN: Derinda Late, MD    ADMISSION DIAGNOSIS:  Cerebrovascular accident (CVA), unspecified mechanism ([place]) [I63.9]  DISCHARGE DIAGNOSIS:  Active Problems:   CVA (cerebral vascular accident) ([place])   SECONDARY DIAGNOSIS:   Past Medical History:  Diagnosis Date  . Allergy   . Diabetes mellitus without complication ([place])   . Hyperlipidemia   . Shingles   . Stroke [facility])     HOSPITAL COURSE:   Acute stroke- confirmed by MRI.   Carotid doppler does nto show any significant blockages.   Echo- no source for the stroke.   PT and OT eval done, no follow ups neded.   Her Left UL weakness is improving,    Seen By neurologist, was on ASA- advised to add plavix.    Started on atorvastatin.    BP under control.   Follow with PMD>     DISCHARGE CONDITIONS:   Stable.  CONSULTS OBTAINED:  Treatment Team:  Alexis Goodell, MD  DRUG ALLERGIES:   Allergies  Allergen Reactions  . Penicillins     Other reaction(s): Other (See Comments) Other reaction  . Ampicillin Rash  . Hydrocodone-Homatropine     Other reaction(s): Other (See Comments) tachycardia  . Prednisone     Other reaction(s): Other (See Comments) "mind spins"  . Amoxicillin Rash  . Tramadol Itching    DISCHARGE MEDICATIONS:   Current Discharge Medication List    START taking these medications   Details  aspirin EC 81 MG tablet Take 1 tablet (81 mg total) by mouth daily. Qty: 100 tablet, Refills: 0    atorvastatin (LIPITOR) 80 MG tablet Take 1 tablet (80 mg total) by mouth daily at 6 PM. Qty: 30 tablet, Refills: 0    clopidogrel (PLAVIX) 75 MG tablet Take 1 tablet (75 mg total) by mouth daily. Qty: 30 tablet, Refills: 0       CONTINUE these medications which have NOT CHANGED   Details  albuterol (PROVENTIL HFA) 108 (90 Base) MCG/ACT inhaler Inhale 2 puffs into the lungs every 6 (six) hours as needed.    amLODipine (NORVASC) 2.5 MG tablet Take 1 tablet (2.5 mg total) by mouth daily. Take once daily if blood pressure > 160 Qty: 14 tablet, Refills: 0    butalbital-acetaminophen-caffeine (FIORICET, ESGIC) 50-325-40 MG tablet TAKE ONE TABLET BY MOUTH EVERY 4 HOURS AS NEEDED    Cholecalciferol (VITAMIN D3) 1000 units CAPS Take 1,000 Units by mouth daily.     Cyanocobalamin (RA VITAMIN B-12 TR) 1000 MCG TBCR Take 1,000 mcg by mouth daily.     doxepin (SINEQUAN) 10 MG capsule TAKE ONE CAPSULE BY MOUTH EVERY NIGHT AT BEDTIME    fluticasone (FLONASE) 50 MCG/ACT nasal spray Place into the nose as needed.     glucose blood (ONE TOUCH ULTRA TEST) test strip EVERY DAY    ibuprofen (ADVIL,MOTRIN) 200 MG tablet Take by mouth as needed.     metFORMIN (GLUCOPHAGE) 500 MG tablet TAKE ONE (1) TABLET BY MOUTH EVERY DAY    ONETOUCH DELICA LANCETS FINE MISC EVERY DAY    Probiotic Product (SOLUBLE FIBER/PROBIOTICS PO) Take 1 tablet by mouth daily.    telmisartan (MICARDIS) 80 MG tablet 80 mg daily.     UNABLE TO FIND  Juice plus(powder)-supplement taken daily with liquid    predniSONE (DELTASONE) 20 MG tablet Take 20 mg by mouth daily.      STOP taking these medications     aspirin (GOODSENSE ASPIRIN) 325 MG tablet      guaiFENesin-codeine (ROBITUSSIN AC) 100-10 MG/5ML syrup          DISCHARGE INSTRUCTIONS:    Follow with PMD in 1 week.  If you experience worsening of your admission symptoms, develop shortness of breath, life threatening emergency, suicidal or homicidal thoughts you must seek medical attention immediately by calling 911 or calling your MD immediately  if symptoms less severe.  You Must read complete instructions/literature along with all the possible adverse reactions/side effects for all the  Medicines you take and that have been prescribed to you. Take any new Medicines after you have completely understood and accept all the possible adverse reactions/side effects.   Please note  You were cared for by a hospitalist during your hospital stay. If you have any questions about your discharge medications or the care you received while you were in the hospital after you are discharged, you can call the unit and asked to speak with the hospitalist on call if the hospitalist that took care of you is not available. Once you are discharged, your primary care physician will handle any further medical issues. Please note that NO REFILLS for any discharge medications will be authorized once you are discharged, as it is imperative that you return to your primary care physician (or establish a relationship with a primary care physician if you do not have one) for your aftercare needs so that they can reassess your need for medications and monitor your lab values.    Today   CHIEF COMPLAINT:   Chief Complaint  Patient presents with  . Numbness    HISTORY OF PRESENT ILLNESS:  Destiny [name]  is a 83 y.o. female with a known history of hyperlipidemia, CVA, shingles, diabetes mellitus type 2 presented to the emergency room with weakness and heaviness in the left arm. Left arm weakness and heaviness started yesterday morning after patient woke up from sleep. No complaints of any slurred speech. No complaints of any tingling sensation in any part of the body. She also felt some numbness in the left arm. Patient was evaluated in the emergency room with CT head and MRI brain as part of stroke workup. MRI brain revealed subacute infarct in the right prefrontal gyrus.hospitalist service was consulted for further care.   VITAL SIGNS:  Blood pressure (!) 145/56, pulse 74, temperature (!) 97.4 F (36.3 C), temperature source Oral, resp. rate 18, height 5' (1.524 m), weight 59.8 kg (131 lb 12.8 oz), SpO2 97  %.  I/O:   Intake/Output Summary (Last 24 hours) at 07/30/17 1731 Last data filed at 07/30/17 1422  Gross per 24 hour  Intake              120 ml  Output              300 ml  Net             -180 ml    PHYSICAL EXAMINATION:  GENERAL:  83 y.o.-year-old patient lying in the bed with no acute distress.  EYES: Pupils equal, round, reactive to light and accommodation. No scleral icterus. Extraocular muscles intact.  HEENT: Head atraumatic, normocephalic. Oropharynx and nasopharynx clear.  NECK:  Supple, no jugular venous distention. No thyroid enlargement, no tenderness.  LUNGS: Normal  breath sounds bilaterally, no wheezing, rales,rhonchi or crepitation. No use of accessory muscles of respiration.  CARDIOVASCULAR: S1, S2 normal. No murmurs, rubs, or gallops.  ABDOMEN: Soft, non-tender, non-distended. Bowel sounds present. No organomegaly or mass.  EXTREMITIES: No pedal edema, cyanosis, or clubbing.  NEUROLOGIC: Cranial nerves II through XII are intact. Muscle strength 5/5 in all extremities. Sensation intact. Gait not checked.  PSYCHIATRIC: The patient is alert and oriented x 3.  SKIN: No obvious rash, lesion, or ulcer.   DATA REVIEW:   CBC  Recent Labs Lab 07/29/17 2126  WBC 7.0  HGB 13.1  HCT 39.7  PLT 260    Chemistries   Recent Labs Lab 07/29/17 2126  NA 142  K 4.3  CL 103  CO2 30  GLUCOSE 118*  BUN 15  CREATININE 0.71  CALCIUM 9.7    Cardiac Enzymes  Recent Labs Lab 07/29/17 2126  TROPONINI <0.03    Microbiology Results  No results found for this or any previous visit.  RADIOLOGY:  Ct Head Wo Contrast  Result Date: 07/29/2017 CLINICAL DATA:  Patient woke up this morning with left arm heaviness and numbness. Symptoms resolved. EXAM: CT HEAD WITHOUT CONTRAST TECHNIQUE: Contiguous axial images were obtained from the base of the skull through the vertex without intravenous contrast. COMPARISON:  10/04/2016 FINDINGS: Brain: Diffuse cerebral atrophy.  Ventricular dilatation consistent with central atrophy. Patchy low-attenuation changes in the deep [place] matter consistent small vessel ischemia. Encephalomalacia in the left parietal region extends to the cortex consistent with old infarct. No change since prior study. No mass effect or midline shift. No abnormal extra-axial fluid collections. Gray-Debenedetto matter junctions are distinct. Basal cisterns are not effaced. No evidence of acute intracranial hemorrhage. Vascular: Internal carotid artery and vertebrobasilar artery vascular calcifications. Skull: The calvarium appears intact. Sinuses/Orbits: Paranasal sinuses and mastoid air cells are clear Other: None. IMPRESSION: No acute intracranial abnormalities. Chronic atrophy and small vessel ischemic changes. Old left parietal infarct. No change since prior study. Electronically Signed   By: Lucienne Capers M.D.   On: 07/29/2017 22:01   Mr Brain Wo Contrast  Result Date: 07/30/2017 CLINICAL DATA:  83 y/o F; transient left arm heaviness and numbness. EXAM: MRI HEAD WITHOUT CONTRAST TECHNIQUE: Multiplanar, multiecho pulse sequences of the brain and surrounding structures were obtained without intravenous contrast. COMPARISON:  07/29/2017 CT head. FINDINGS: Brain: Subcentimeter focus of reduced diffusion within the right superior prefrontal gyrus (series 100, image 41). Chronic left parietal infarction. Several small chronic infarctions in the right greater than left cerebellar hemispheres. T2 hyperintense foci in the basal ganglia may represent prominent perivascular spaces and/or chronic lacunar infarctions. Few nonspecific foci of T2 FLAIR hyperintense signal abnormality in subcortical and periventricular [place] matter are compatible with mild chronic microvascular ischemic changes for age. Mild brain parenchymal volume loss. Vascular: Normal flow voids. Skull and upper cervical spine: Normal marrow signal. Sinuses/Orbits: Mild maxillary sinus mucosal thickening  and small right maxillary sinus mucous retention cyst. Opacification of posterior ethmoid air cells. Partial opacification of left-greater-than-right mastoid air cells. Bilateral intra-ocular lens replacement. Other: None. IMPRESSION: 1. Subcentimeter acute/early subacute infarction within right superior prefrontal gyrus. No hemorrhage or mass effect. 2. Stable mild for age chronic microvascular ischemic changes and mild parenchymal volume loss of the brain. 3. Stable chronic infarctions as above. 4. Mild paranasal sinus disease. Electronically Signed   By: Kristine Garbe M.D.   On: 07/30/2017 01:01   US Carotid Bilateral (at [facility])  Result Date: 07/30/2017  CLINICAL DATA:  CVA. History of hypertension, hyperlipidemia and diabetes. EXAM: BILATERAL CAROTID DUPLEX ULTRASOUND TECHNIQUE: Pearline Cables scale imaging, color Doppler and duplex ultrasound were performed of bilateral carotid and vertebral arteries in the neck. COMPARISON:  None. FINDINGS: Criteria: Quantification of carotid stenosis is based on velocity parameters that correlate the residual internal carotid diameter with NASCET-based stenosis levels, using the diameter of the distal internal carotid lumen as the denominator for stenosis measurement. The following velocity measurements were obtained: RIGHT ICA:  54/14 cm/sec CCA:  09/32 cm/sec SYSTOLIC ICA/CCA RATIO:  0.9 DIASTOLIC ICA/CCA RATIO:  1.2 ECA:  65 cm/sec LEFT ICA:  83/22 cm/sec CCA:  35/57 cm/sec SYSTOLIC ICA/CCA RATIO:  1.4 DIASTOLIC ICA/CCA RATIO:  2.1 ECA:  58 cm/sec RIGHT CAROTID ARTERY: There is a minimal amount of atherosclerotic plaque within the right carotid bulb (image 14 and 15), not resulting in elevated peak systolic velocities within the interrogated course the right internal carotid artery to suggest a hemodynamically significant stenosis. RIGHT VERTEBRAL ARTERY:  Antegrade Flow LEFT CAROTID ARTERY: There is a minimal amount of atherosclerotic plaque involving the  origin and proximal aspect the left internal carotid artery (image 54), not resulting in elevated peak systolic velocities within the interrogated course the left internal carotid artery to suggest a hemodynamically significant stenosis. LEFT VERTEBRAL ARTERY:  Antegrade flow IMPRESSION: Minimal amount of bilateral atherosclerotic plaque, left subjectively greater than right, not resulting in a hemodynamically significant stenosis within either internal carotid artery. Electronically Signed   By: Sandi Mariscal M.D.   On: 07/30/2017 09:08    EKG:   Orders placed or performed during the hospital encounter of 07/29/17  . ED EKG  . ED EKG  . EKG 12-Lead  . EKG 12-Lead      Management plans discussed with the patient, family and they are in agreement.  CODE STATUS:     Code Status Orders        Start     Ordered   07/30/17 0233  Full code  Continuous     07/30/17 0233    Code Status History    Date Active Date Inactive Code Status Order ID Comments User Context   This patient has a current code status but no historical code status.    Advance Directive Documentation     Most Recent Value  Type of Advance Directive  Healthcare Power of [place], Living will [[place] (POA)]  Pre-existing out of facility DNR order (yellow form or pink MOST form)  -  "MOST" Form in Place?  -      TOTAL TIME TAKING CARE OF THIS PATIENT: 35 minutes.    Vaughan Basta M.D on 07/30/2017 at 5:31 PM  Between 7am to 6pm - Pager - [phone]  After 6pm go to [email or web address] - password [email or web address] [facility]  Office  [phone]  CC: Primary care physician; Derinda Late, MD   Note: This dictation was prepared with Dragon dictation along with smaller phrase technology. Any transcriptional errors that result from this process are unintentional.

## 2017-07-30 NOTE — Consult Note (Signed)
Patient unavailable, sleeping; if additional information required, please contact [facility].  Will pass on to Chaplain to visit Thursday prior to discharge.

## 2017-07-30 NOTE — Care Management Obs Status (Signed)
[facility] NOTIFICATION   Patient Details  Name: Destiny [name] MRN: [id number] Date of Birth: Oct 06, 1924   Medicare Observation Status Notification Given:  Yes    Shelbie Ammons, RN 07/30/2017, 2:01 PM

## 2017-07-30 NOTE — Plan of Care (Signed)
Problem: Education: Goal: Knowledge of [facility] Education information/materials will improve Outcome: Progressing Pt likes to be called Shirlee [place]  PAST MEDICAL HISTORY:   Past Medical History:  Diagnosis Date  . Allergy   . Diabetes mellitus without complication ([place])   . Hyperlipidemia   . Shingles   . Stroke [facility])    Pt is well controlled with home medications

## 2017-07-30 NOTE — Consult Note (Signed)
Referring Physician: Anselm Jungling    Chief Complaint: Left arm weakness and numbness  HPI: Destiny [name] is an 83 y.o. female who reports going to bed at baseline on 10/15.  Awakened 10/16 and when she reached for something noted that her left hand was not working well.  Continued to try to work with it throughout the day with no improvement and presented for evaluation at that time.  Initial NIHSS of 1. Patient has a history of stroke without residual.  Was on Coumadin in the past but this was discontinued.  Currently on ASA daily.      Date last known well: Date: 07/28/2017 Time last known well: Time: 22:30 tPA Given: No: Outside time window  Past Medical History:  Diagnosis Date  . Allergy   . Diabetes mellitus without complication ([place])   . Hyperlipidemia   . Shingles   . Stroke [facility])     Past Surgical History:  Procedure Laterality Date  . APPENDECTOMY    . BACK SURGERY    . FOOT SURGERY Bilateral    [place]  . JOINT REPLACEMENT    . KNEE SURGERY Bilateral   . TONSILECTOMY, ADENOIDECTOMY, BILATERAL MYRINGOTOMY AND TUBES      Family History  Problem Relation Age of Onset  . Stroke Mother   . Stroke Father    Social History:  reports that she has never smoked. She has never used smokeless tobacco. She reports that she does not drink alcohol or use drugs.  Allergies:  Allergies  Allergen Reactions  . Penicillins     Other reaction(s): Other (See Comments) Other reaction  . Ampicillin Rash  . Hydrocodone-Homatropine     Other reaction(s): Other (See Comments) tachycardia  . Prednisone     Other reaction(s): Other (See Comments) "mind spins"  . Amoxicillin Rash  . Tramadol Itching    Medications:  I have reviewed the patient's current medications. Prior to Admission:  Prescriptions Prior to Admission  Medication Sig Dispense Refill Last Dose  . albuterol (PROVENTIL HFA) 108 (90 Base) MCG/ACT inhaler Inhale 2 puffs into the lungs every 6 (six) hours as  needed.     Marland Kitchen amLODipine (NORVASC) 2.5 MG tablet Take 1 tablet (2.5 mg total) by mouth daily. Take once daily if blood pressure > 160 14 tablet 0   . aspirin (GOODSENSE ASPIRIN) 325 MG tablet Take 325 mg by mouth.   Taking  . butalbital-acetaminophen-caffeine (FIORICET, ESGIC) 50-325-40 MG tablet TAKE ONE TABLET BY MOUTH EVERY 4 HOURS AS NEEDED   Taking  . Cholecalciferol (VITAMIN D3) 1000 units CAPS Take 1,000 Units by mouth daily.    Taking  . Cyanocobalamin (RA VITAMIN B-12 TR) 1000 MCG TBCR Take 1,000 mcg by mouth daily.    Taking  . doxepin (SINEQUAN) 10 MG capsule TAKE ONE CAPSULE BY MOUTH EVERY NIGHT AT BEDTIME   Taking  . fluticasone (FLONASE) 50 MCG/ACT nasal spray Place into the nose as needed.    Taking  . glucose blood (ONE TOUCH ULTRA TEST) test strip EVERY DAY   Taking  . ibuprofen (ADVIL,MOTRIN) 200 MG tablet Take by mouth as needed.    Taking  . metFORMIN (GLUCOPHAGE) 500 MG tablet TAKE ONE (1) TABLET BY MOUTH EVERY DAY   Taking  . [place] LANCETS FINE MISC EVERY DAY   Taking  . Probiotic Product (SOLUBLE FIBER/PROBIOTICS PO) Take 1 tablet by mouth daily.   Taking  . telmisartan (MICARDIS) 80 MG tablet 80 mg daily.    Taking  .  UNABLE TO FIND Juice plus(powder)-supplement taken daily with liquid   Taking  . predniSONE (DELTASONE) 20 MG tablet Take 20 mg by mouth daily.   Not Taking at Unknown time   Scheduled: . amLODipine  2.5 mg Oral Daily  . aspirin  300 mg Rectal Daily   Or  . aspirin  325 mg Oral Daily  . atorvastatin  80 mg Oral q1800  . cholecalciferol  1,000 Units Oral Daily  . doxepin  10 mg Oral QHS  . insulin aspart  0-5 Units Subcutaneous QHS  . insulin aspart  0-9 Units Subcutaneous TID WC  . irbesartan  75 mg Oral Daily  . vitamin B-12  1,000 mcg Oral Daily    ROS: History obtained from the patient  General ROS: negative for - chills, fatigue, fever, night sweats, weight gain or weight loss Psychological ROS: negative for - behavioral disorder,  hallucinations, memory difficulties, mood swings or suicidal ideation Ophthalmic ROS: negative for - blurry vision, double vision, eye pain or loss of vision ENT ROS: negative for - epistaxis, nasal discharge, oral lesions, sore throat, tinnitus or vertigo Allergy and Immunology ROS: negative for - hives or itchy/watery eyes Hematological and Lymphatic ROS: negative for - bleeding problems, bruising or swollen lymph nodes Endocrine ROS: negative for - galactorrhea, hair pattern changes, polydipsia/polyuria or temperature intolerance Respiratory ROS: cough due to recent URI Cardiovascular ROS: negative for - chest pain, dyspnea on exertion, edema or irregular heartbeat Gastrointestinal ROS: negative for - abdominal pain, diarrhea, hematemesis, nausea/vomiting or stool incontinence Genito-Urinary ROS: negative for - dysuria, hematuria, incontinence or urinary frequency/urgency Musculoskeletal ROS: negative for - joint swelling or muscular weakness Neurological ROS: as noted in HPI Dermatological ROS: negative for rash and skin lesion changes  Physical Examination: Blood pressure (!) 143/59, pulse 73, temperature 98 F (36.7 C), resp. rate 18, height 5' (1.524 m), weight 59.8 kg (131 lb 12.8 oz), SpO2 93 %.  HEENT-  Normocephalic, no lesions, without obvious abnormality.  Normal external eye and conjunctiva.  Normal TM's bilaterally.  Normal auditory canals and external ears. Normal external nose, mucus membranes and septum.  Normal pharynx. Cardiovascular- S1, S2 normal, pulses palpable throughout   Lungs- chest clear, no wheezing, rales, normal symmetric air entry Abdomen- soft, non-tender; bowel sounds normal; no masses,  no organomegaly Extremities- no edema Lymph-no adenopathy palpable Musculoskeletal-no joint tenderness, deformity or swelling Skin-warm and dry, no hyperpigmentation, vitiligo, or suspicious lesions  Neurological Examination   Mental Status: Alert, oriented, thought  content appropriate.  Speech fluent without evidence of aphasia.  Able to follow 3 step commands without difficulty. Cranial Nerves: II: Discs flat bilaterally; Visual fields grossly normal, pupils equal, round, reactive to light and accommodation III,IV, VI: ptosis not present, extra-ocular motions intact bilaterally V,VII: smile symmetric, facial light touch sensation normal bilaterally VIII: hearing normal bilaterally IX,X: gag reflex present XI: bilateral shoulder shrug XII: midline tongue extension Motor: Generalized weakness throughout.  Pronator drift noted in the LUE Sensory: Pinprick and light touch decreased in the LUE Deep Tendon Reflexes: 2+ in the upper extremities and absent in the lower extremities Plantars: Right: downgoing   Left: downgoing Cerebellar: Normal finger-to-nose, normal rapid alternating movements and normal heel-to-shin testing bilaterally Gait: not tested due to safety concerns    Laboratory Studies:  Basic Metabolic Panel:  Recent Labs Lab 07/29/17 2126  NA 142  K 4.3  CL 103  CO2 30  GLUCOSE 118*  BUN 15  CREATININE 0.71  CALCIUM 9.7  Liver Function Tests: No results for input(s): AST, ALT, ALKPHOS, BILITOT, PROT, ALBUMIN in the last 168 hours. No results for input(s): LIPASE, AMYLASE in the last 168 hours. No results for input(s): AMMONIA in the last 168 hours.  CBC:  Recent Labs Lab 07/29/17 2126  WBC 7.0  HGB 13.1  HCT 39.7  MCV 91.8  PLT 260    Cardiac Enzymes:  Recent Labs Lab 07/29/17 2126  TROPONINI <0.03    BNP: Invalid input(s): POCBNP  CBG:  Recent Labs Lab 07/30/17 0732 07/30/17 1131  GLUCAP 110* 175*    Microbiology: No results found for this or any previous visit.  Coagulation Studies:  Recent Labs  07/30/17 0121  LABPROT 12.5  INR 0.94    Urinalysis:  Recent Labs Lab 07/30/17 0121  COLORURINE STRAW*  LABSPEC 1.005  PHURINE 7.0  GLUCOSEU NEGATIVE  HGBUR NEGATIVE  BILIRUBINUR  NEGATIVE  KETONESUR NEGATIVE  PROTEINUR NEGATIVE  NITRITE NEGATIVE  LEUKOCYTESUR NEGATIVE    Lipid Panel:    Component Value Date/Time   CHOL 159 07/30/2017 0506   TRIG 106 07/30/2017 0506   HDL 52 07/30/2017 0506   CHOLHDL 3.1 07/30/2017 0506   VLDL 21 07/30/2017 0506   LDLCALC 86 07/30/2017 0506    HgbA1C:  Lab Results  Component Value Date   HGBA1C 6.7 (H) 07/30/2017    Urine Drug Screen:     Component Value Date/Time   LABOPIA NONE DETECTED 07/30/2017 0121   COCAINSCRNUR NONE DETECTED 07/30/2017 0121   LABBENZ NONE DETECTED 07/30/2017 0121   AMPHETMU NONE DETECTED 07/30/2017 0121   THCU NONE DETECTED 07/30/2017 0121   LABBARB NONE DETECTED 07/30/2017 0121    Alcohol Level:  Recent Labs Lab 07/30/17 0121  ETH <10    Other results: EKG: normal sinus rhythm at 73 bpm.  Imaging: Ct Head Wo Contrast  Result Date: 07/29/2017 CLINICAL DATA:  Patient woke up this morning with left arm heaviness and numbness. Symptoms resolved. EXAM: CT HEAD WITHOUT CONTRAST TECHNIQUE: Contiguous axial images were obtained from the base of the skull through the vertex without intravenous contrast. COMPARISON:  10/04/2016 FINDINGS: Brain: Diffuse cerebral atrophy. Ventricular dilatation consistent with central atrophy. Patchy low-attenuation changes in the deep [place] matter consistent small vessel ischemia. Encephalomalacia in the left parietal region extends to the cortex consistent with old infarct. No change since prior study. No mass effect or midline shift. No abnormal extra-axial fluid collections. [place] matter junctions are distinct. Basal cisterns are not effaced. No evidence of acute intracranial hemorrhage. Vascular: Internal carotid artery and vertebrobasilar artery vascular calcifications. Skull: The calvarium appears intact. Sinuses/Orbits: Paranasal sinuses and mastoid air cells are clear Other: None. IMPRESSION: No acute intracranial abnormalities. Chronic atrophy and  small vessel ischemic changes. Old left parietal infarct. No change since prior study. Electronically Signed   By: Lucienne Capers M.D.   On: 07/29/2017 22:01   Mr Brain Wo Contrast  Result Date: 07/30/2017 CLINICAL DATA:  83 y/o F; transient left arm heaviness and numbness. EXAM: MRI HEAD WITHOUT CONTRAST TECHNIQUE: Multiplanar, multiecho pulse sequences of the brain and surrounding structures were obtained without intravenous contrast. COMPARISON:  07/29/2017 CT head. FINDINGS: Brain: Subcentimeter focus of reduced diffusion within the right superior prefrontal gyrus (series 100, image 41). Chronic left parietal infarction. Several small chronic infarctions in the right greater than left cerebellar hemispheres. T2 hyperintense foci in the basal ganglia may represent prominent perivascular spaces and/or chronic lacunar infarctions. Few nonspecific foci of T2 FLAIR hyperintense signal abnormality in  subcortical and periventricular Fasching matter are compatible with mild chronic microvascular ischemic changes for age. Mild brain parenchymal volume loss. Vascular: Normal flow voids. Skull and upper cervical spine: Normal marrow signal. Sinuses/Orbits: Mild maxillary sinus mucosal thickening and small right maxillary sinus mucous retention cyst. Opacification of posterior ethmoid air cells. Partial opacification of left-greater-than-right mastoid air cells. Bilateral intra-ocular lens replacement. Other: None. IMPRESSION: 1. Subcentimeter acute/early subacute infarction within right superior prefrontal gyrus. No hemorrhage or mass effect. 2. Stable mild for age chronic microvascular ischemic changes and mild parenchymal volume loss of the brain. 3. Stable chronic infarctions as above. 4. Mild paranasal sinus disease. Electronically Signed   By: Kristine Garbe M.D.   On: 07/30/2017 01:01   US Carotid Bilateral (at [facility] Only)  Result Date: 07/30/2017 CLINICAL DATA:  CVA. History of hypertension,  hyperlipidemia and diabetes. EXAM: BILATERAL CAROTID DUPLEX ULTRASOUND TECHNIQUE: Pearline Cables scale imaging, color Doppler and duplex ultrasound were performed of bilateral carotid and vertebral arteries in the neck. COMPARISON:  None. FINDINGS: Criteria: Quantification of carotid stenosis is based on velocity parameters that correlate the residual internal carotid diameter with NASCET-based stenosis levels, using the diameter of the distal internal carotid lumen as the denominator for stenosis measurement. The following velocity measurements were obtained: RIGHT ICA:  54/14 cm/sec CCA:  62/22 cm/sec SYSTOLIC ICA/CCA RATIO:  0.9 DIASTOLIC ICA/CCA RATIO:  1.2 ECA:  65 cm/sec LEFT ICA:  83/22 cm/sec CCA:  97/98 cm/sec SYSTOLIC ICA/CCA RATIO:  1.4 DIASTOLIC ICA/CCA RATIO:  2.1 ECA:  58 cm/sec RIGHT CAROTID ARTERY: There is a minimal amount of atherosclerotic plaque within the right carotid bulb (image 14 and 15), not resulting in elevated peak systolic velocities within the interrogated course the right internal carotid artery to suggest a hemodynamically significant stenosis. RIGHT VERTEBRAL ARTERY:  Antegrade Flow LEFT CAROTID ARTERY: There is a minimal amount of atherosclerotic plaque involving the origin and proximal aspect the left internal carotid artery (image 54), not resulting in elevated peak systolic velocities within the interrogated course the left internal carotid artery to suggest a hemodynamically significant stenosis. LEFT VERTEBRAL ARTERY:  Antegrade flow IMPRESSION: Minimal amount of bilateral atherosclerotic plaque, left subjectively greater than right, not resulting in a hemodynamically significant stenosis within either internal carotid artery. Electronically Signed   By: Sandi Mariscal M.D.   On: 07/30/2017 09:08    Assessment: 83 y.o. female presenting with complaints of LUE numbness and weakness.  MRI of the brain reviewed and shows a small, acute right prefrontal gyrus infarct.  Likely secondary to  small vessel disease.  Carotid dopplers show no evidence of hemodynamically significant stenosis.  Echocardiogram pending.  A1c 6.7, LDL 86. Per daughter was on Coumadin in the past for stroke.  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  This was later discontinued.    Stroke Risk Factors - diabetes mellitus and hyperlipidemia  Plan: 1. PT consult, OT consult, Speech consult 2. Echocardiogram pending 3. Prophylactic therapy-ASA 81mg  and Plavix 75mg  daily 4. Telemetry monitoring 5. Frequent neuro checks 6. Aggressive lipid management with target LDL<70.   7. Patient to follow up with neurology on an outpatient basis.     Alexis Goodell, MD Neurology [phone] 07/30/2017, 11:47 AM

## 2017-07-30 NOTE — Evaluation (Signed)
Occupational Therapy Evaluation Patient Details Name: Destiny [name] MRN: [id number] DOB: 10/27/1923 Today's Date: 07/30/2017    History of Present Illness Pt is a 83 y/o F who presented with LUE weakness and heaviness as well as some numbness in L arm.  MRI brain revealed subacute infarct in the right prefrontal gyrus.  Pt's PMH includes stroke, shingles, back surgery, foot surgery.   Clinical Impression   Pt seen for OT evaluation this date. Pt presents with mild FMC deficits in L non-dominant hand and decreased strength in LUE. Sensation intact, pt just reports that LUE "feels heavy". Pt lives alone and has been enjoying learning to play the piano with her daughter, driving independently and not using and AD at baseline prior to admission. Pt educated in falls prevention strategies and FMC/strength exercises for LUE with handout provided. Pt verbalized understanding. Pt may benefit from outpatient OT services should symptoms persist and negatively impact pt's quality of life and ability to participate in her daily routines. Recommended pt speak with the physician regarding timeline for driving.    Follow Up Recommendations  Outpatient OT    Equipment Recommendations  None recommended by OT    Recommendations for Other Services       Precautions / Restrictions Precautions Precautions: None Restrictions Weight Bearing Restrictions: No      Mobility Bed Mobility               General bed mobility comments: Pt sitting in chair at start and end of session  Transfers Overall transfer level: Independent Equipment used: None             General transfer comment: No physical assist or cues needed.  No instability noted.     Balance Overall balance assessment: Needs assistance Sitting-balance support: No upper extremity supported;Feet supported Sitting balance-Leahy Scale: Normal     Standing balance support: No upper extremity supported;During functional  activity Standing balance-Leahy Scale: Good                             ADL either performed or assessed with clinical judgement   ADL Overall ADL's : Modified independent                                       General ADL Comments: grossly independent with ADL tasks, decreased [place] impairing pt's ability to perform FM tasks but overall able to perform without assist     Vision Baseline Vision/History: Wears glasses Wears Glasses: At all times Patient Visual Report: No change from baseline Vision Assessment?: No apparent visual deficits     Perception     Praxis      Pertinent Vitals/Pain Pain Assessment: No/denies pain     Hand Dominance Right   Extremity/Trunk Assessment Upper Extremity Assessment Upper Extremity Assessment: LUE deficits/detail LUE Deficits / Details: LUE grossly 4/5 versus RUE 5/5 LUE Coordination: decreased fine motor   Lower Extremity Assessment Lower Extremity Assessment: Defer to PT evaluation;Overall [facility] for tasks assessed   Cervical / Trunk Assessment Cervical / Trunk Assessment: Normal   Communication Communication Communication: No difficulties   Cognition Arousal/Alertness: Awake/alert Behavior During Therapy: WFL for tasks assessed/performed Overall Cognitive Status: Within Functional Limits for tasks assessed  General Comments: A&Ox4, follows all commands, good safety awareness, problem solving, memory, appropriate throughout   General Comments  Reviewed signs and symptoms of a stroke with the pt and daughter and what to do if these are noted.  Pt and daughter verbalized understanding.     Exercises Exercises: Other exercises Other Exercises Other Exercises: pt educated in [facility] exercises to perform with LUE with handout provided, pt verbalized understanding Other Exercises: pt educated in general falls prevention strategies, verbalized understanding    Shoulder Instructions      Home Living Family/patient expects to be discharged to:: Private residence Living Arrangements: Alone Available Help at Discharge: Family;Available PRN/intermittently Type of Home: Other(Comment) (condo) Home Access: Level entry     Home Layout: One level     Bathroom Shower/Tub: Occupational psychologist: Handicapped height     Home Equipment: Cane - single point;Shower seat - built in          Prior Functioning/Environment Level of Independence: Independent        Comments: Pt denies any falls in the past 6 months.  She ambulates without AD.  Pt independent with all aspects of mobility and is still driving.         OT Problem List: Decreased coordination;Decreased strength;Impaired UE functional use      OT Treatment/Interventions:      OT Goals(Current goals can be found in the care plan section) Acute Rehab OT Goals Patient Stated Goal: to go home OT Goal Formulation: With patient Time For Goal Achievement: 08/06/17 Potential to Achieve Goals: Good  OT Frequency:     Barriers to D/C:            Co-evaluation              AM-PAC PT "6 Clicks" Daily Activity     Outcome Measure Help from another person eating meals?: None Help from another person taking care of personal grooming?: None Help from another person toileting, which includes using toliet, bedpan, or urinal?: None Help from another person bathing (including washing, rinsing, drying)?: None Help from another person to put on and taking off regular upper body clothing?: None Help from another person to put on and taking off regular lower body clothing?: None 6 Click Score: 24   End of Session    Activity Tolerance: Patient tolerated treatment well Patient left: in chair;with call bell/phone within reach  OT Visit Diagnosis: Hemiplegia and hemiparesis Hemiplegia - Right/Left: Left Hemiplegia - dominant/non-dominant: Non-Dominant Hemiplegia - caused  by: Cerebral infarction                Time: 1353-1415 OT Time Calculation (min): 22 min Charges:  OT General Charges $OT Visit: 1 Visit OT Evaluation $OT Eval Low Complexity: 1 Low OT Treatments $Therapeutic Exercise: 8-22 mins G-Codes: OT G-codes **NOT FOR INPATIENT CLASS** Functional Assessment Tool Used: AM-PAC 6 Clicks Daily Activity;Clinical judgement Functional Limitation: Self care Self Care Current Status (R6789): At least 1 percent but less than 20 percent impaired, limited or restricted Self Care Goal Status (F8101): At least 1 percent but less than 20 percent impaired, limited or restricted Self Care Discharge Status [phone]): At least 1 percent but less than 20 percent impaired, limited or restricted   Jeni Salles, MPH, MS, OTR/L ascom [phone] 07/30/17, 3:54 PM\

## 2017-08-26 DIAGNOSIS — E119 Type 2 diabetes mellitus without complications: Secondary | ICD-10-CM | POA: Insufficient documentation

## 2017-09-08 ENCOUNTER — Encounter: Payer: Self-pay | Admitting: Nurse Practitioner

## 2017-09-08 ENCOUNTER — Ambulatory Visit
Admission: RE | Admit: 2017-09-08 | Discharge: 2017-09-08 | Disposition: A | Discharge status: Home / Self Care | Payer: Medicare Other | Source: Ambulatory Visit | Attending: Nurse Practitioner | Admitting: Nurse Practitioner

## 2017-09-08 ENCOUNTER — Ambulatory Visit: Payer: Medicare Other | Attending: Nurse Practitioner | Admitting: Nurse Practitioner

## 2017-09-08 VITALS — BP 132/60 | HR 80 | Temp 98.2°F | Resp 16 | Ht 60.0 in | Wt 130.0 lb

## 2017-09-08 DIAGNOSIS — M545 Low back pain, unspecified: Secondary | ICD-10-CM | POA: Insufficient documentation

## 2017-09-08 DIAGNOSIS — G894 Chronic pain syndrome: Secondary | ICD-10-CM | POA: Diagnosis not present

## 2017-09-08 DIAGNOSIS — G8929 Other chronic pain: Secondary | ICD-10-CM | POA: Diagnosis present

## 2017-09-08 DIAGNOSIS — M7918 Myalgia, other site: Secondary | ICD-10-CM | POA: Insufficient documentation

## 2017-09-08 DIAGNOSIS — M47816 Spondylosis without myelopathy or radiculopathy, lumbar region: Secondary | ICD-10-CM | POA: Insufficient documentation

## 2017-09-08 DIAGNOSIS — G43909 Migraine, unspecified, not intractable, without status migrainosus: Secondary | ICD-10-CM | POA: Diagnosis not present

## 2017-09-08 DIAGNOSIS — Z7982 Long term (current) use of aspirin: Secondary | ICD-10-CM | POA: Diagnosis not present

## 2017-09-08 DIAGNOSIS — E119 Type 2 diabetes mellitus without complications: Secondary | ICD-10-CM | POA: Diagnosis not present

## 2017-09-08 DIAGNOSIS — Z7984 Long term (current) use of oral hypoglycemic drugs: Secondary | ICD-10-CM | POA: Insufficient documentation

## 2017-09-08 DIAGNOSIS — Z961 Presence of intraocular lens: Secondary | ICD-10-CM | POA: Diagnosis not present

## 2017-09-08 DIAGNOSIS — Z7902 Long term (current) use of antithrombotics/antiplatelets: Secondary | ICD-10-CM | POA: Insufficient documentation

## 2017-09-08 DIAGNOSIS — M25551 Pain in right hip: Secondary | ICD-10-CM | POA: Diagnosis not present

## 2017-09-08 DIAGNOSIS — Z79899 Other long term (current) drug therapy: Secondary | ICD-10-CM | POA: Insufficient documentation

## 2017-09-08 DIAGNOSIS — Z8673 Personal history of transient ischemic attack (TIA), and cerebral infarction without residual deficits: Secondary | ICD-10-CM | POA: Diagnosis not present

## 2017-09-08 DIAGNOSIS — M160 Bilateral primary osteoarthritis of hip: Secondary | ICD-10-CM | POA: Insufficient documentation

## 2017-09-08 DIAGNOSIS — M533 Sacrococcygeal disorders, not elsewhere classified: Secondary | ICD-10-CM | POA: Diagnosis not present

## 2017-09-08 DIAGNOSIS — E785 Hyperlipidemia, unspecified: Secondary | ICD-10-CM | POA: Diagnosis not present

## 2017-09-08 DIAGNOSIS — M25552 Pain in left hip: Secondary | ICD-10-CM | POA: Diagnosis not present

## 2017-09-08 DIAGNOSIS — I10 Essential (primary) hypertension: Secondary | ICD-10-CM | POA: Diagnosis not present

## 2017-09-08 DIAGNOSIS — Z880 Allergy status to penicillin: Secondary | ICD-10-CM | POA: Insufficient documentation

## 2017-09-08 DIAGNOSIS — K219 Gastro-esophageal reflux disease without esophagitis: Secondary | ICD-10-CM | POA: Insufficient documentation

## 2017-09-08 NOTE — Patient Instructions (Addendum)
Sacroiliac (SI) Joint Injection Patient Information  Description: The sacroiliac joint connects the scrum (very low back and tailbone) to the ilium (a pelvic bone which also forms half of the hip joint).  Normally this joint experiences very little motion.  When this joint becomes inflamed or unstable low back and or hip and pelvis pain may result.  Injection of this joint with local anesthetics (numbing medicines) and steroids can provide diagnostic information and reduce pain.  This injection is performed with the aid of x-ray guidance into the tailbone area while you are lying on your stomach.   You may experience an electrical sensation down the leg while this is being done.  You may also experience numbness.  We also may ask if we are reproducing your normal pain during the injection.  Conditions which may be treated SI injection:   Low back, buttock, hip or leg pain  Preparation for the Injection:  1. Do not eat any solid food or dairy products within 8 hours of your appointment.  2. You may drink clear liquids up to 3 hours before appointment.  Clear liquids include water, black coffee, juice or soda.  No milk or cream please. 3. You may take your regular medications, including pain medications with a sip of water before your appointment.  Diabetics should hold regular insulin (if take separately) and take 1/2 normal NPH dose the morning of the procedure.  Carry some sugar containing items with you to your appointment. 4. A driver must accompany you and be prepared to drive you home after your procedure. 5. Bring all of your current medications with you. 6. An IV may be inserted and sedation may be given at the discretion of the physician. 7. A blood pressure cuff, EKG and other monitors will often be applied during the procedure.  Some patients may need to have extra oxygen administered for a short period.  8. You will be asked to provide medical information, including your allergies,  prior to the procedure.  We must know immediately if you are taking blood thinners (like Coumadin/Warfarin) or if you are allergic to IV iodine contrast (dye).  We must know if you could possible be pregnant.  Possible side effects:   Bleeding from needle site  Infection (rare, may require surgery)  Nerve injury (rare)  Numbness & tingling (temporary)  A brief convulsion or seizure  Light-headedness (temporary)  Pain at injection site (several days)  Decreased blood pressure (temporary)  Weakness in the leg (temporary)   Call if you experience:   New onset weakness or numbness of an extremity below the injection site that last more than 8 hours.  Hives or difficulty breathing ( go to the emergency room)  Inflammation or drainage at the injection site  Any new symptoms which are concerning to you  Please note:  Although the local anesthetic injected can often make your back/ hip/ buttock/ leg feel good for several hours after the injections, the pain will likely return.  It takes 3-7 days for steroids to work in the sacroiliac area.  You may not notice any pain relief for at least that one week.  If effective, we will often do a series of three injections spaced 3-6 weeks apart to maximally decrease your pain.  After the initial series, we generally will wait some months before a repeat injection of the same type.  If you have any questions, please call [phone] [facility]  Epidural Steroid Injection  Patient Information  Description: The epidural space surrounds the nerves as they exit the spinal cord.  In some patients, the nerves can be compressed and inflamed by a bulging disc or a tight spinal canal (spinal stenosis).  By injecting steroids into the epidural space, we can bring irritated nerves into direct contact with a potentially helpful medication.  These steroids act directly on the irritated nerves and can reduce swelling  and inflammation which often leads to decreased pain.  Epidural steroids may be injected anywhere along the spine and from the neck to the low back depending upon the location of your pain.   After numbing the skin with local anesthetic (like Novocaine), a small needle is passed into the epidural space slowly.  You may experience a sensation of pressure while this is being done.  The entire block usually last less than 10 minutes.  Conditions which may be treated by epidural steroids:   Low back and leg pain  Neck and arm pain  Spinal stenosis  Post-laminectomy syndrome  Herpes zoster (shingles) pain  Pain from compression fractures  Preparation for the injection:  9. Do not eat any solid food or dairy products within 8 hours of your appointment.  10. You may drink clear liquids up to 3 hours before appointment.  Clear liquids include water, black coffee, juice or soda.  No milk or cream please. 11. You may take your regular medication, including pain medications, with a sip of water before your appointment  Diabetics should hold regular insulin (if taken separately) and take 1/2 normal NPH dos the morning of the procedure.  Carry some sugar containing items with you to your appointment. 12. A driver must accompany you and be prepared to drive you home after your procedure.  68. Bring all your current medications with your. 14. An IV may be inserted and sedation may be given at the discretion of the physician.   15. A blood pressure cuff, EKG and other monitors will often be applied during the procedure.  Some patients may need to have extra oxygen administered for a short period. 45. You will be asked to provide medical information, including your allergies, prior to the procedure.  We must know immediately if you are taking blood thinners (like Coumadin/Warfarin)  Or if you are allergic to IV iodine contrast (dye). We must know if you could possible be pregnant.  Possible  side-effects:  Bleeding from needle site  Infection (rare, may require surgery)  Nerve injury (rare)  Numbness & tingling (temporary)  Difficulty urinating (rare, temporary)  Spinal headache ( a headache worse with upright posture)  Light -headedness (temporary)  Pain at injection site (several days)  Decreased blood pressure (temporary)  Weakness in arm/leg (temporary)  Pressure sensation in back/neck (temporary)  Call if you experience:  Fever/chills associated with headache or increased back/neck pain.  Headache worsened by an upright position.  New onset weakness or numbness of an extremity below the injection site  Hives or difficulty breathing (go to the emergency room)  Inflammation or drainage at the infection site  Severe back/neck pain  Any new symptoms which are concerning to you  Please note:  Although the local anesthetic injected can often make your back or neck feel good for several hours after the injection, the pain will likely return.  It takes 3-7 days for steroids to work in the epidural space.  You may not notice any pain relief for at least that one week.  If effective,  we will often do a series of three injections spaced 3-6 weeks apart to maximally decrease your pain.  After the initial series, we generally will wait several months before considering a repeat injection of the same type.  If you have any questions, please call [phone] [facility]____________________________________________________________________________________________  Pain Scale  Introduction: The pain score used by this practice is the Verbal Numerical Rating Scale (VNRS-11). This is an 11-point scale. It is for adults and children 10 years or older. There are significant differences in how the pain score is reported, used, and applied. Forget everything you learned in the past and learn this scoring system.  General Information:  The scale should reflect your current level of pain. Unless you are specifically asked for the level of your worst pain, or your average pain. If you are asked for one of these two, then it should be understood that it is over the past 24 hours.  Basic Activities of Daily Living (ADL): Personal hygiene, dressing, eating, transferring, and using restroom.  Instructions: Most patients tend to report their level of pain as a combination of two factors, their physical pain and their psychosocial pain. This last one is also known as "suffering" and it is reflection of how physical pain affects you socially and psychologically. From now on, report them separately. From this point on, when asked to report your pain level, report only your physical pain. Use the following table for reference.  Pain Clinic Pain Levels (0-5/10)  Pain Level Score  Description  No Pain 0   Mild pain 1 Nagging, annoying, but does not interfere with basic activities of daily living (ADL). Patients are able to eat, bathe, get dressed, toileting (being able to get on and off the toilet and perform personal hygiene functions), transfer (move in and out of bed or a chair without assistance), and maintain continence (able to control bladder and bowel functions). Blood pressure and heart rate are unaffected. A normal heart rate for a healthy adult ranges from 60 to 100 bpm (beats per minute).   Mild to moderate pain 2 Noticeable and distracting. Impossible to hide from other people. More frequent flare-ups. Still possible to adapt and function close to normal. It can be very annoying and may have occasional stronger flare-ups. With discipline, patients may get used to it and adapt.   Moderate pain 3 Interferes significantly with activities of daily living (ADL). It becomes difficult to feed, bathe, get dressed, get on and off the toilet or to perform personal hygiene functions. Difficult to get in and out of bed or a chair without  assistance. Very distracting. With effort, it can be ignored when deeply involved in activities.   Moderately severe pain 4 Impossible to ignore for more than a few minutes. With effort, patients may still be able to manage work or participate in some social activities. Very difficult to concentrate. Signs of autonomic nervous system discharge are evident: dilated pupils (mydriasis); mild sweating (diaphoresis); sleep interference. Heart rate becomes elevated (>115 bpm). Diastolic blood pressure (lower number) rises above 100 mmHg. Patients find relief in laying down and not moving.   Severe pain 5 Intense and extremely unpleasant. Associated with frowning face and frequent crying. Pain overwhelms the senses.  Ability to do any activity or maintain social relationships becomes significantly limited. Conversation becomes difficult. Pacing back and forth is common, as getting into a comfortable position is nearly impossible. Pain wakes you up from deep sleep. Physical signs  will be obvious: pupillary dilation; increased sweating; goosebumps; brisk reflexes; cold, clammy hands and feet; nausea, vomiting or dry heaves; loss of appetite; significant sleep disturbance with inability to fall asleep or to remain asleep. When persistent, significant weight loss is observed due to the complete loss of appetite and sleep deprivation.  Blood pressure and heart rate becomes significantly elevated. Caution: If elevated blood pressure triggers a pounding headache associated with blurred vision, then the patient should immediately seek attention at an urgent or emergency care unit, as these may be signs of an impending stroke.    Emergency Department Pain Levels (6-10/10)  Emergency Room Pain 6 Severely limiting. Requires emergency care and should not be seen or managed at an outpatient pain management facility. Communication becomes difficult and requires great effort. Assistance to reach the emergency department may be  required. Facial flushing and profuse sweating along with potentially dangerous increases in heart rate and blood pressure will be evident.   Distressing pain 7 Self-care is very difficult. Assistance is required to transport, or use restroom. Assistance to reach the emergency department will be required. Tasks requiring coordination, such as bathing and getting dressed become very difficult.   Disabling pain 8 Self-care is no longer possible. At this level, pain is disabling. The individual is unable to do even the most "basic" activities such as walking, eating, bathing, dressing, transferring to a bed, or toileting. Fine motor skills are lost. It is difficult to think clearly.   Incapacitating pain 9 Pain becomes incapacitating. Thought processing is no longer possible. Difficult to remember your own name. Control of movement and coordination are lost.   The worst pain imaginable 10 At this level, most patients pass out from pain. When this level is reached, collapse of the autonomic nervous system occurs, leading to a sudden drop in blood pressure and heart rate. This in turn results in a temporary and dramatic drop in blood flow to the brain, leading to a loss of consciousness. Fainting is one of the body's self defense mechanisms. Passing out puts the brain in a calmed state and causes it to shut down for a while, in order to begin the healing process.    Summary: 1. Refer to this scale when providing [place] with your pain level. 2. Be accurate and careful when reporting your pain level. This will help with your care. 3. Over-reporting your pain level will lead to loss of credibility. 4. Even a level of 1/10 means that there is pain and will be treated at our facility. 5. High, inaccurate reporting will be documented as "Symptom Exaggeration", leading to loss of credibility and suspicions of possible secondary gains such as obtaining more narcotics, or wanting to appear disabled, for fraudulent  reasons. 6. Only pain levels of 5 or below will be seen at our facility. 7. Pain levels of 6 and above will be sent to the Emergency Department and the appointment cancelled. ____________________________________________________________________________________________

## 2017-09-08 NOTE — Progress Notes (Signed)
Patient's Name: Destiny [name]  MRN: [id number]  Referring Provider: Derinda Late, MD  DOB: 02/10/1924  PCP: Derinda Late, MD  DOS: 09/08/2017  Note by: Vevelyn Francois NP  Service setting: Ambulatory outpatient  Specialty: Interventional Pain Management  Location: [facility] (AMB) Pain Management Facility    Patient type: Established    Primary Reason(s) for Visit: Evaluation of chronic illnesses with exacerbation, or progression (Level of risk: moderate) CC: Back Pain (lower left)  HPI  Destiny [name] is a 83 y.o. year old, female patient, who comes today for a follow-up evaluation. She has Myofascial pain syndrome; Chronic left sacroiliac pain; Esophageal reflux; Benign essential hypertension; Migraine; Other and unspecified hyperlipidemia; Pseudophakia, both eyes; Salzmann's nodular dystrophy; Type II or unspecified type diabetes mellitus without mention of complication, not stated as uncontrolled; CVA (cerebral vascular accident) ([place]); Diabetes mellitus type 2, uncomplicated ([place]); Chronic low back pain; Sacroiliac joint pain; Chronic hip pain; and Chronic pain syndrome on their problem list. Destiny [name] was last seen on Visit date not found. Her primarily concern today is the Back Pain (lower left)  Pain Assessment: Location: Lower, Left Back Radiating: pain travels from left to right  Onset: More than a month ago Duration: Chronic pain Quality: (incapacitating) Severity: 6 /10 (self-reported pain score)  Note: Reported level is compatible with observation. Clinically the patient looks like a 2/10 A 2/10 is viewed as "Mild to Moderate" and described as noticeable and distracting. Impossible to hide from other people. More frequent flare-ups. Still possible to adapt and function close to normal. It can be very annoying and may have occasional stronger flare-ups. With discipline, patients may get used to it and adapt. Information on the proper use of the pain scale provided to the patient today. When  using our objective Pain Scale, levels between 6 and 10/10 are said to belong in an emergency room, as it progressively worsens from a 6/10, described as severely limiting, requiring emergency care not usually available at an outpatient pain management facility. At a 6/10 level, communication becomes difficult and requires great effort. Assistance to reach the emergency department may be required. Facial flushing and profuse sweating along with potentially dangerous increases in heart rate and blood pressure will be evident. Effect on ADL: difficulty switching positions, getting in and out of bed.  Timing: Constant Modifying factors: patient had procedure performed x 2 years for same type of pain and patient reports relief.     Further details on both, my assessment(s), as well as the proposed treatment plan, please see below. She is in today with her daughter.   She is having bilateral hip and back pain. She states that her left side is worse than the right. She was recently seen by Reche Dixon a [facility]. She requested hip injections. She was told at that time that it was her SI joint not her hips causing pain. She admits that she is having groin pain but this may be related to constipation and straining. She did have a stroke 07/29/17. She was started on Plavix. She feels like this is causing her to have headaches. She thought that she would be able to have a procedure tomorrow. Her daughtere states that she had a LESI in the past. Her last injection has been greater that 2 years ago.   Laboratory Chemistry  Inflammation Markers (CRP: Acute Phase) (ESR: Chronic Phase) No results found for: CRP, ESRSEDRATE  Renal Function Markers Lab Results  Component Value Date   BUN 15 07/29/2017   CREATININE 0.71 07/29/2017   GFRAA >60 07/29/2017   GFRNONAA >60 07/29/2017                 Hepatic Function Markers Lab Results  Component Value Date   AST 23 02/09/2013   ALT 13  02/09/2013   ALBUMIN 3.9 02/09/2013   ALKPHOS 87 02/09/2013                 Electrolytes Lab Results  Component Value Date   NA 142 07/29/2017   K 4.3 07/29/2017   CL 103 07/29/2017   CALCIUM 9.7 07/29/2017                 Neuropathy Markers No results found for: [id number]               Bone Pathology Markers Lab Results  Component Value Date   ALKPHOS 87 02/09/2013   CALCIUM 9.7 07/29/2017                 Rheumatology Markers No results found for: LABURIC, URICUR              Coagulation Parameters Lab Results  Component Value Date   INR 0.94 07/30/2017   LABPROT 12.5 07/30/2017   APTT 34 07/30/2017   PLT 260 07/29/2017                 Cardiovascular Markers Lab Results  Component Value Date   TROPONINI <0.03 07/29/2017   HGB 13.1 07/29/2017   HCT 39.7 07/29/2017                 CA Markers No results found for: CEA, CA125, LABCA2               Note: Lab results reviewed.  Recent Diagnostic Imaging Review    Complexity Note: Imaging results reviewed. Results shared with Destiny [name], using Layman's terms.                         Meds   Current Outpatient Medications:  .  amLODipine (NORVASC) 2.5 MG tablet, Take 1 tablet (2.5 mg total) by mouth daily. Take once daily if blood pressure > 160, Disp: 14 tablet, Rfl: 0 .  aspirin EC 81 MG tablet, Take 1 tablet (81 mg total) by mouth daily., Disp: 100 tablet, Rfl: 0 .  clopidogrel (PLAVIX) 75 MG tablet, Take 1 tablet (75 mg total) by mouth daily., Disp: 30 tablet, Rfl: 0 .  doxepin (SINEQUAN) 10 MG capsule, TAKE ONE CAPSULE BY MOUTH EVERY NIGHT AT BEDTIME, Disp: , Rfl:  .  glucose blood (ONE TOUCH ULTRA TEST) test strip, EVERY DAY, Disp: , Rfl:  .  metFORMIN (GLUCOPHAGE) 500 MG tablet, TAKE ONE (1) TABLET BY MOUTH EVERY DAY, Disp: , Rfl:  .  ONETOUCH DELICA LANCETS FINE MISC, EVERY DAY, Disp: , Rfl:  .  telmisartan (MICARDIS) 80 MG tablet, 80 mg daily. , Disp: , Rfl:  .  telmisartan (MICARDIS) 80 MG tablet,  Take 1 tablet by mouth daily., Disp: , Rfl:  .  fluticasone (FLONASE) 50 MCG/ACT nasal spray, Place into the nose as needed. , Disp: , Rfl:   ROS  Constitutional: Denies any fever or chills Gastrointestinal: No reported hemesis, hematochezia, vomiting, or acute GI distress Musculoskeletal: Denies any acute onset joint swelling, redness, loss of ROM, or weakness Neurological: No reported episodes  of acute onset apraxia, aphasia, dysarthria, agnosia, amnesia, paralysis, loss of coordination, or loss of consciousness  Allergies  Ms. Musson is allergic to penicillins; ampicillin; hydrocodone-homatropine; prednisone; amoxicillin; and tramadol.  PFSH  Drug: Ms. Ugarte  reports that she does not use drugs. Alcohol:  reports that she does not drink alcohol. Tobacco:  reports that  has never smoked. she has never used smokeless tobacco. Medical:  has a past medical history of Allergy, Diabetes mellitus without complication ([place]), Hyperlipidemia, Shingles, and Stroke ([place]). Surgical: Ms. Roads  has a past surgical history that includes Joint replacement; Knee surgery (Bilateral); Back surgery; Appendectomy; Tonsilectomy, adenoidectomy, bilateral myringotomy and tubes; and Foot surgery (Bilateral). Family: family history includes Stroke in her father and mother.  Constitutional Exam  General appearance: Well nourished, well developed, and well hydrated. In no apparent acute distress Vitals:   09/08/17 1154  BP: 132/60  Pulse: 80  Resp: 16  Temp: 98.2 F (36.8 C)  TempSrc: Oral  SpO2: 100%  Weight: 130 lb (59 kg)  Height: 5' (1.524 m)   BMI Assessment: Estimated body mass index is 25.39 kg/m as calculated from the following:   Height as of this encounter: 5' (1.524 m).   Weight as of this encounter: 130 lb (59 kg). Psych/Mental status: Alert, oriented x 3 (person, place, & time)       Eyes: PERLA Respiratory: No evidence of acute respiratory distress   Lumbar Spine Area Exam  Skin &  Axial Inspection: No masses, redness, or swelling Alignment: Symmetrical Functional ROM: Unrestricted ROM      Stability: No instability detected Muscle Tone/Strength: Functionally intact. No obvious neuro-muscular anomalies detected. Sensory (Neurological): Unimpaired Palpation: Complains of area being tender to palpation       Provocative Tests: Lumbar Hyperextension and rotation test: evaluation deferred today       Lumbar Lateral bending test: evaluation deferred today       Patrick's Maneuver: Positive             for bilateral hip arthralgia  Gait & Posture Assessment  Ambulation: Unassisted Gait: Relatively normal for age and body habitus Posture: WNL   Lower Extremity Exam    Side: Right lower extremity  Side: Left lower extremity  Skin & Extremity Inspection: Skin color, temperature, and hair growth are WNL. No peripheral edema or cyanosis. No masses, redness, swelling, asymmetry, or associated skin lesions. No contractures.  Skin & Extremity Inspection: Skin color, temperature, and hair growth are WNL. No peripheral edema or cyanosis. No masses, redness, swelling, asymmetry, or associated skin lesions. No contractures.  Functional ROM: Unrestricted ROM          Functional ROM: Unrestricted ROM          Muscle Tone/Strength: Functionally intact. No obvious neuro-muscular anomalies detected.  Muscle Tone/Strength: Functionally intact. No obvious neuro-muscular anomalies detected.  Sensory (Neurological): Unimpaired  Sensory (Neurological): Unimpaired  Palpation: No palpable anomalies  Palpation: No palpable anomalies   Assessment  Primary Diagnosis & Pertinent Problem List: The primary encounter diagnosis was Chronic bilateral low back pain without sciatica. Diagnoses of Sacroiliac joint pain, Chronic pain of both hips, and Chronic pain syndrome were also pertinent to this visit.  Status Diagnosis  Worsening Worsening Worsening 1. Chronic bilateral low back pain without  sciatica   2. Sacroiliac joint pain   3. Chronic pain of both hips   4. Chronic pain syndrome     Problems updated and reviewed during this visit: Problem  Sacroiliac Joint Pain  Chronic Hip Pain  Chronic Pain Syndrome  Chronic Low Back Pain  Diabetes Mellitus Type 2, Uncomplicated ([facility])   Plan of Care  Pharmacotherapy (Medications Ordered): No orders of the defined types were placed in this encounter. This SmartLink is deprecated. Use AVSMEDLIST instead to display the medication list for a patient. Medications administered today: Sanskriti T. Nordahl had no medications administered during this visit. Lab-work, procedure(s), and/or referral(s): Orders Placed This Encounter  Procedures  . Lumbar Epidural Injection  . SACROILIAC JOINT INJECTION  . DG Lumbar Spine Complete W/Bend  . DG Si Joints   Imaging and/or referral(s): DG LUMBAR SPINE COMPLETE W/BEND 6+V DG SI JOINTS  Interventional therapies: Planned, scheduled, and/or pending:   Bilateral SI joint injections/ LESI sedation questionable. Medical Clearance needed for Plavix (pending)   Considering:   Left  PRN palliative sacroiliac joint injections  Diagnostic Bilateral Lumbar Facet NB Diagnostic Bilateral Hip injections   Palliative PRN treatment(s):   Left  PRN palliative sacroiliac joint injections    Provider-requested follow-up: Return for w/ Dr. Dossie Arbour, Procedure(w/Sedation), (ASAP).  Future Appointments  Date Time Provider [place]  09/18/2017  9:30 AM Milinda Pointer, MD [facility] None   Primary Care Physician: Derinda Late, MD Location: [facility] Outpatient Pain Management Facility Note by: Vevelyn Francois NP Date: 09/08/2017; Time: 1:53 PM  Pain Score Disclaimer: We use the NRS-11 scale. This is a self-reported, subjective measurement of pain severity with only modest accuracy. It is used primarily to identify changes within a particular patient. It must be understood that outpatient pain  scales are significantly less accurate that those used for research, where they can be applied under ideal controlled circumstances with minimal exposure to variables. In reality, the score is likely to be a combination of pain intensity and pain affect, where pain affect describes the degree of emotional arousal or changes in action readiness caused by the sensory experience of pain. Factors such as social and work situation, setting, emotional state, anxiety levels, expectation, and prior pain experience may influence pain perception and show large inter-individual differences that may also be affected by time variables.  Patient instructions provided during this appointment: Patient Instructions   Sacroiliac (SI) Joint Injection Patient Information  Description: The sacroiliac joint connects the scrum (very low back and tailbone) to the ilium (a pelvic bone which also forms half of the hip joint).  Normally this joint experiences very little motion.  When this joint becomes inflamed or unstable low back and or hip and pelvis pain may result.  Injection of this joint with local anesthetics (numbing medicines) and steroids can provide diagnostic information and reduce pain.  This injection is performed with the aid of x-ray guidance into the tailbone area while you are lying on your stomach.   You may experience an electrical sensation down the leg while this is being done.  You may also experience numbness.  We also may ask if we are reproducing your normal pain during the injection.  Conditions which may be treated SI injection:   Low back, buttock, hip or leg pain  Preparation for the Injection:  1. Do not eat any solid food or dairy products within 8 hours of your appointment.  2. You may drink clear liquids up to 3 hours before appointment.  Clear liquids include water, black coffee, juice or soda.  No milk or cream please. 3. You may take your regular medications, including pain  medications with a sip of water before your appointment.  Diabetics  should hold regular insulin (if take separately) and take 1/2 normal NPH dose the morning of the procedure.  Carry some sugar containing items with you to your appointment. 4. A driver must accompany you and be prepared to drive you home after your procedure. 5. Bring all of your current medications with you. 6. An IV may be inserted and sedation may be given at the discretion of the physician. 7. A blood pressure cuff, EKG and other monitors will often be applied during the procedure.  Some patients may need to have extra oxygen administered for a short period.  8. You will be asked to provide medical information, including your allergies, prior to the procedure.  We must know immediately if you are taking blood thinners (like Coumadin/Warfarin) or if you are allergic to IV iodine contrast (dye).  We must know if you could possible be pregnant.  Possible side effects:   Bleeding from needle site  Infection (rare, may require surgery)  Nerve injury (rare)  Numbness & tingling (temporary)  A brief convulsion or seizure  Light-headedness (temporary)  Pain at injection site (several days)  Decreased blood pressure (temporary)  Weakness in the leg (temporary)   Call if you experience:   New onset weakness or numbness of an extremity below the injection site that last more than 8 hours.  Hives or difficulty breathing ( go to the emergency room)  Inflammation or drainage at the injection site  Any new symptoms which are concerning to you  Please note:  Although the local anesthetic injected can often make your back/ hip/ buttock/ leg feel good for several hours after the injections, the pain will likely return.  It takes 3-7 days for steroids to work in the sacroiliac area.  You may not notice any pain relief for at least that one week.  If effective, we will often do a series of three injections spaced 3-6  weeks apart to maximally decrease your pain.  After the initial series, we generally will wait some months before a repeat injection of the same type.  If you have any questions, please call [phone] [facility]  Epidural Steroid Injection Patient Information  Description: The epidural space surrounds the nerves as they exit the spinal cord.  In some patients, the nerves can be compressed and inflamed by a bulging disc or a tight spinal canal (spinal stenosis).  By injecting steroids into the epidural space, we can bring irritated nerves into direct contact with a potentially helpful medication.  These steroids act directly on the irritated nerves and can reduce swelling and inflammation which often leads to decreased pain.  Epidural steroids may be injected anywhere along the spine and from the neck to the low back depending upon the location of your pain.   After numbing the skin with local anesthetic (like Novocaine), a small needle is passed into the epidural space slowly.  You may experience a sensation of pressure while this is being done.  The entire block usually last less than 10 minutes.  Conditions which may be treated by epidural steroids:   Low back and leg pain  Neck and arm pain  Spinal stenosis  Post-laminectomy syndrome  Herpes zoster (shingles) pain  Pain from compression fractures  Preparation for the injection:  9. Do not eat any solid food or dairy products within 8 hours of your appointment.  10. You may drink clear liquids up to 3 hours before appointment.  Clear liquids include  water, black coffee, juice or soda.  No milk or cream please. 11. You may take your regular medication, including pain medications, with a sip of water before your appointment  Diabetics should hold regular insulin (if taken separately) and take 1/2 normal NPH dos the morning of the procedure.  Carry some sugar containing items with you to your  appointment. 12. A driver must accompany you and be prepared to drive you home after your procedure.  72. Bring all your current medications with your. 14. An IV may be inserted and sedation may be given at the discretion of the physician.   15. A blood pressure cuff, EKG and other monitors will often be applied during the procedure.  Some patients may need to have extra oxygen administered for a short period. 43. You will be asked to provide medical information, including your allergies, prior to the procedure.  We must know immediately if you are taking blood thinners (like Coumadin/Warfarin)  Or if you are allergic to IV iodine contrast (dye). We must know if you could possible be pregnant.  Possible side-effects:  Bleeding from needle site  Infection (rare, may require surgery)  Nerve injury (rare)  Numbness & tingling (temporary)  Difficulty urinating (rare, temporary)  Spinal headache ( a headache worse with upright posture)  Light -headedness (temporary)  Pain at injection site (several days)  Decreased blood pressure (temporary)  Weakness in arm/leg (temporary)  Pressure sensation in back/neck (temporary)  Call if you experience:  Fever/chills associated with headache or increased back/neck pain.  Headache worsened by an upright position.  New onset weakness or numbness of an extremity below the injection site  Hives or difficulty breathing (go to the emergency room)  Inflammation or drainage at the infection site  Severe back/neck pain  Any new symptoms which are concerning to you  Please note:  Although the local anesthetic injected can often make your back or neck feel good for several hours after the injection, the pain will likely return.  It takes 3-7 days for steroids to work in the epidural space.  You may not notice any pain relief for at least that one week.  If effective, we will often do a series of three injections spaced 3-6 weeks apart to  maximally decrease your pain.  After the initial series, we generally will wait several months before considering a repeat injection of the same type.  If you have any questions, please call [phone] [facility]____________________________________________________________________________________________  Pain Scale  Introduction: The pain score used by this practice is the Verbal Numerical Rating Scale (VNRS-11). This is an 11-point scale. It is for adults and children 10 years or older. There are significant differences in how the pain score is reported, used, and applied. Forget everything you learned in the past and learn this scoring system.  General Information: The scale should reflect your current level of pain. Unless you are specifically asked for the level of your worst pain, or your average pain. If you are asked for one of these two, then it should be understood that it is over the past 24 hours.  Basic Activities of Daily Living (ADL): Personal hygiene, dressing, eating, transferring, and using restroom.  Instructions: Most patients tend to report their level of pain as a combination of two factors, their physical pain and their psychosocial pain. This last one is also known as "suffering" and it is reflection of how physical pain affects you socially and psychologically. From  now on, report them separately. From this point on, when asked to report your pain level, report only your physical pain. Use the following table for reference.  Pain Clinic Pain Levels (0-5/10)  Pain Level Score  Description  No Pain 0   Mild pain 1 Nagging, annoying, but does not interfere with basic activities of daily living (ADL). Patients are able to eat, bathe, get dressed, toileting (being able to get on and off the toilet and perform personal hygiene functions), transfer (move in and out of bed or a chair without assistance), and maintain continence (able to control  bladder and bowel functions). Blood pressure and heart rate are unaffected. A normal heart rate for a healthy adult ranges from 60 to 100 bpm (beats per minute).   Mild to moderate pain 2 Noticeable and distracting. Impossible to hide from other people. More frequent flare-ups. Still possible to adapt and function close to normal. It can be very annoying and may have occasional stronger flare-ups. With discipline, patients may get used to it and adapt.   Moderate pain 3 Interferes significantly with activities of daily living (ADL). It becomes difficult to feed, bathe, get dressed, get on and off the toilet or to perform personal hygiene functions. Difficult to get in and out of bed or a chair without assistance. Very distracting. With effort, it can be ignored when deeply involved in activities.   Moderately severe pain 4 Impossible to ignore for more than a few minutes. With effort, patients may still be able to manage work or participate in some social activities. Very difficult to concentrate. Signs of autonomic nervous system discharge are evident: dilated pupils (mydriasis); mild sweating (diaphoresis); sleep interference. Heart rate becomes elevated (>115 bpm). Diastolic blood pressure (lower number) rises above 100 mmHg. Patients find relief in laying down and not moving.   Severe pain 5 Intense and extremely unpleasant. Associated with frowning face and frequent crying. Pain overwhelms the senses.  Ability to do any activity or maintain social relationships becomes significantly limited. Conversation becomes difficult. Pacing back and forth is common, as getting into a comfortable position is nearly impossible. Pain wakes you up from deep sleep. Physical signs will be obvious: pupillary dilation; increased sweating; goosebumps; brisk reflexes; cold, clammy hands and feet; nausea, vomiting or dry heaves; loss of appetite; significant sleep disturbance with inability to fall asleep or to remain  asleep. When persistent, significant weight loss is observed due to the complete loss of appetite and sleep deprivation.  Blood pressure and heart rate becomes significantly elevated. Caution: If elevated blood pressure triggers a pounding headache associated with blurred vision, then the patient should immediately seek attention at an urgent or emergency care unit, as these may be signs of an impending stroke.    Emergency Department Pain Levels (6-10/10)  Emergency Room Pain 6 Severely limiting. Requires emergency care and should not be seen or managed at an outpatient pain management facility. Communication becomes difficult and requires great effort. Assistance to reach the emergency department may be required. Facial flushing and profuse sweating along with potentially dangerous increases in heart rate and blood pressure will be evident.   Distressing pain 7 Self-care is very difficult. Assistance is required to transport, or use restroom. Assistance to reach the emergency department will be required. Tasks requiring coordination, such as bathing and getting dressed become very difficult.   Disabling pain 8 Self-care is no longer possible. At this level, pain is disabling. The individual is unable to do even the  most "basic" activities such as walking, eating, bathing, dressing, transferring to a bed, or toileting. Fine motor skills are lost. It is difficult to think clearly.   Incapacitating pain 9 Pain becomes incapacitating. Thought processing is no longer possible. Difficult to remember your own name. Control of movement and coordination are lost.   The worst pain imaginable 10 At this level, most patients pass out from pain. When this level is reached, collapse of the autonomic nervous system occurs, leading to a sudden drop in blood pressure and heart rate. This in turn results in a temporary and dramatic drop in blood flow to the brain, leading to a loss of consciousness. Fainting is one of  the body's self defense mechanisms. Passing out puts the brain in a calmed state and causes it to shut down for a while, in order to begin the healing process.    Summary: 1. Refer to this scale when providing [place] with your pain level. 2. Be accurate and careful when reporting your pain level. This will help with your care. 3. Over-reporting your pain level will lead to loss of credibility. 4. Even a level of 1/10 means that there is pain and will be treated at our facility. 5. High, inaccurate reporting will be documented as "Symptom Exaggeration", leading to loss of credibility and suspicions of possible secondary gains such as obtaining more narcotics, or wanting to appear disabled, for fraudulent reasons. 6. Only pain levels of 5 or below will be seen at our facility. 7. Pain levels of 6 and above will be sent to the Emergency Department and the appointment cancelled. ____________________________________________________________________________________________

## 2017-09-08 NOTE — Progress Notes (Signed)
Safety precautions to be maintained throughout the outpatient stay will include: orient to surroundings, keep bed in low position, maintain call bell within reach at all times, provide assistance with transfer out of bed and ambulation.  

## 2017-09-09 ENCOUNTER — Telehealth: Payer: Self-pay | Admitting: *Deleted

## 2017-09-09 NOTE — Telephone Encounter (Signed)
No voicemail available for Destiny [name], voicemail left with [name] (daughter) who accompanied patient to visit on yesterday.  Spoke with Trudie Buckler and gave verbal instructions and she states she will be sure that they get the message to stop Plavix on tomorrow 09/10/17 until the day of procedure.

## 2017-09-09 NOTE — Progress Notes (Signed)
Results were reviewed and found to be: mildly abnormal  No acute injury or pathology identified  Review would suggest interventional pain management techniques may be of benefit 

## 2017-09-17 DIAGNOSIS — M160 Bilateral primary osteoarthritis of hip: Secondary | ICD-10-CM | POA: Insufficient documentation

## 2017-09-17 DIAGNOSIS — M5136 Other intervertebral disc degeneration, lumbar region: Secondary | ICD-10-CM | POA: Insufficient documentation

## 2017-09-17 DIAGNOSIS — Z7901 Long term (current) use of anticoagulants: Secondary | ICD-10-CM | POA: Insufficient documentation

## 2017-09-17 DIAGNOSIS — M51369 Other intervertebral disc degeneration, lumbar region without mention of lumbar back pain or lower extremity pain: Secondary | ICD-10-CM | POA: Insufficient documentation

## 2017-09-17 DIAGNOSIS — M47816 Spondylosis without myelopathy or radiculopathy, lumbar region: Secondary | ICD-10-CM | POA: Insufficient documentation

## 2017-09-17 DIAGNOSIS — M47818 Spondylosis without myelopathy or radiculopathy, sacral and sacrococcygeal region: Secondary | ICD-10-CM | POA: Insufficient documentation

## 2017-09-17 DIAGNOSIS — M461 Sacroiliitis, not elsewhere classified: Secondary | ICD-10-CM | POA: Insufficient documentation

## 2017-09-17 NOTE — Progress Notes (Addendum)
Patient's Name: Destiny [name]  MRN: [id number]  Referring Provider: Derinda Late, MD  DOB: 1923-12-18  PCP: Derinda Late, MD  DOS: 09/18/2017  Note by: Gaspar Cola, MD  Service setting: Ambulatory outpatient  Specialty: Interventional Pain Management  Patient type: Established  Location: [facility] (AMB) Pain Management Facility  Visit type: Interventional Procedure   Primary Reason for Visit: Interventional Pain Management Treatment. CC: Hip Pain (right)  Procedure:  Anesthesia, Analgesia, Anxiolysis:  Type: Diagnostic Inter-Laminar Epidural Steroid Injection Region: Lumbar Level: L5-S1 Level. Laterality: Right-Sided Paramedial  Type: Local Anesthesia Local Anesthetic: Lidocaine 1% Route: Infiltration ([place]/IM) IV Access: Declined Sedation: Declined  Indication(s): Analgesia           Indications: 1. Chronic low back pain (Primary Area of Pain) (Bilateral) (L>R)   2. Chronic hip pain (Primary Area of Pain) (Bilateral) (L>R)   3. Chronic sacroiliac joint pain (Bilateral) (L>R)   4. DDD (degenerative disc disease), lumbar   5. Chronic bilateral low back pain without sciatica   6. Chronic pain of both hips   7. Chronic sacroiliac joint pain   8. Lumbar facet syndrome (Bilateral)    Pain Score: Pre-procedure: 7 /10 Post-procedure: 3 /10  Pre-op Assessment:  Destiny [name] is a 83 y.o. (year old), female patient, seen today for interventional treatment. She  has a past surgical history that includes Joint replacement; Knee surgery (Bilateral); Back surgery; Appendectomy; Tonsilectomy, adenoidectomy, bilateral myringotomy and tubes; and Foot surgery (Bilateral). Destiny [name] has a current medication list which includes the following prescription(s): amlodipine, aspirin ec, clopidogrel, doxepin, glucose blood, metformin, onetouch delica lancets fine, telmisartan, telmisartan, and fluticasone. Her primarily concern today is the Hip Pain (right)  Review of prior medical records outside of  [facility] indicates that I treated her on 05/17/2014 with a right sided L3-4 interlaminar lumbar epidural steroid injection and on on 08/03/2014, with a bilateral lumbar facet block. Unfortunately, she does not remember which one of the 2 worked the best.  Initial Vital Signs: There were no vitals taken for this visit. BMI: Estimated body mass index is 24.41 kg/m as calculated from the following:   Height as of this encounter: 5' (1.524 m).   Weight as of this encounter: 125 lb (56.7 kg).  Risk Assessment: Allergies: Reviewed. She is allergic to penicillins; ampicillin; hydrocodone-homatropine; prednisone; amoxicillin; and tramadol.  Allergy Precautions: None required Coagulopathies: Reviewed. None identified.  Blood-thinner therapy: None at this time Active Infection(s): Reviewed. None identified. Destiny [name] is afebrile  Site Confirmation: Destiny [name] was asked to confirm the procedure and laterality before marking the site Procedure checklist: Completed Consent: Before the procedure and under the influence of no sedative(s), amnesic(s), or anxiolytics, the patient was informed of the treatment options, risks and possible complications. To fulfill our ethical and legal obligations, as recommended by the American Medical Association's Code of Ethics, I have informed the patient of my clinical impression; the nature and purpose of the treatment or procedure; the risks, benefits, and possible complications of the intervention; the alternatives, including doing nothing; the risk(s) and benefit(s) of the alternative treatment(s) or procedure(s); and the risk(s) and benefit(s) of doing nothing. The patient was provided information about the general risks and possible complications associated with the procedure. These may include, but are not limited to: failure to achieve desired goals, infection, bleeding, organ or nerve damage, allergic reactions, paralysis, and death. In addition, the patient was informed  of those risks and complications associated to Spine-related procedures, such as failure to decrease  pain; infection (i.e.: Meningitis, epidural or intraspinal abscess); bleeding (i.e.: epidural hematoma, subarachnoid hemorrhage, or any other type of intraspinal or peri-dural bleeding); organ or nerve damage (i.e.: Any type of peripheral nerve, nerve root, or spinal cord injury) with subsequent damage to sensory, motor, and/or autonomic systems, resulting in permanent pain, numbness, and/or weakness of one or several areas of the body; allergic reactions; (i.e.: anaphylactic reaction); and/or death. Furthermore, the patient was informed of those risks and complications associated with the medications. These include, but are not limited to: allergic reactions (i.e.: anaphylactic or anaphylactoid reaction(s)); adrenal axis suppression; blood sugar elevation that in diabetics may result in ketoacidosis or comma; water retention that in patients with history of congestive heart failure may result in shortness of breath, pulmonary edema, and decompensation with resultant heart failure; weight gain; swelling or edema; medication-induced neural toxicity; particulate matter embolism and blood vessel occlusion with resultant organ, and/or nervous system infarction; and/or aseptic necrosis of one or more joints. Finally, the patient was informed that Medicine is not an exact science; therefore, there is also the possibility of unforeseen or unpredictable risks and/or possible complications that may result in a catastrophic outcome. The patient indicated having understood very clearly. We have given the patient no guarantees and we have made no promises. Enough time was given to the patient to ask questions, all of which were answered to the patient's satisfaction. Destiny [name] has indicated that she wanted to continue with the procedure. Attestation: I, the ordering provider, attest that I have discussed with the patient the  benefits, risks, side-effects, alternatives, likelihood of achieving goals, and potential problems during recovery for the procedure that I have provided informed consent. Date: 09/18/2017; Time: 5:52 PM  Pre-Procedure Preparation:  Monitoring: As per clinic protocol. Respiration, ETCO2, SpO2, BP, heart rate and rhythm monitor placed and checked for adequate function Safety Precautions: Patient was assessed for positional comfort and pressure points before starting the procedure. Time-out: I initiated and conducted the "Time-out" before starting the procedure, as per protocol. The patient was asked to participate by confirming the accuracy of the "Time Out" information. Verification of the correct person, site, and procedure were performed and confirmed by me, the nursing staff, and the patient. "Time-out" conducted as per Joint Commission's Universal Protocol (UP.01.01.01). "Time-out" Date & Time: 09/18/2017; 1054 hrs.  Description of Procedure Process:   Position: Prone with head of the table was raised to facilitate breathing. Target Area: The interlaminar space, initially targeting the lower laminar border of the superior vertebral body. Approach: Paramedial approach. Area Prepped: Entire Posterior Lumbar Region Prepping solution: ChloraPrep (2% chlorhexidine gluconate and 70% isopropyl alcohol) Safety Precautions: Aspiration looking for blood return was conducted prior to all injections. At no point did we inject any substances, as a needle was being advanced. No attempts were made at seeking any paresthesias. Safe injection practices and needle disposal techniques used. Medications properly checked for expiration dates. SDV (single dose vial) medications used. Description of the Procedure: Protocol guidelines were followed. The procedure needle was introduced through the skin, ipsilateral to the reported pain, and advanced to the target area. Bone was contacted and the needle walked caudad, until  the lamina was cleared. The epidural space was identified using "loss-of-resistance technique" with 2-3 ml of PF-NaCl (0.9% NSS), in a 5cc LOR glass syringe. Vitals:   09/18/17 1058 09/18/17 1103 09/18/17 1108 09/18/17 1112  BP: (!) 166/79 (!) 182/79 (!) 171/90 (!) 177/84  Pulse: 76 82 76 75  Resp: 19 19  16 16  Temp:      SpO2: 100% 99% 100% 99%  Weight:      Height:        Start Time: 1055 hrs. End Time: 1109 hrs. Materials:  Needle(s) Type: Epidural needle Gauge: 17G Length: 3.5-in Medication(s): We administered iopamidol, triamcinolone acetonide, ropivacaine (PF) 2 mg/mL (0.2%), sodium chloride flush, and lidocaine. Please see chart orders for dosing details.  Imaging Guidance (Spinal):  Type of Imaging Technique: Fluoroscopy Guidance (Spinal) Indication(s): Assistance in needle guidance and placement for procedures requiring needle placement in or near specific anatomical locations not easily accessible without such assistance. Exposure Time: Please see nurses notes. Contrast: Before injecting any contrast, we confirmed that the patient did not have an allergy to iodine, shellfish, or radiological contrast. Once satisfactory needle placement was completed at the desired level, radiological contrast was injected. Contrast injected under live fluoroscopy. No contrast complications. See chart for type and volume of contrast used. Fluoroscopic Guidance: I was personally present during the use of fluoroscopy. "Tunnel Vision Technique" used to obtain the best possible view of the target area. Parallax error corrected before commencing the procedure. "Direction-depth-direction" technique used to introduce the needle under continuous pulsed fluoroscopy. Once target was reached, antero-posterior, oblique, and lateral fluoroscopic projection used confirm needle placement in all planes. Images permanently stored in EMR. Interpretation: I personally interpreted the imaging intraoperatively. Adequate  needle placement confirmed in multiple planes. Appropriate spread of contrast into desired area was observed. No evidence of afferent or efferent intravascular uptake. No intrathecal or subarachnoid spread observed. Permanent images saved into the patient's record.  Antibiotic Prophylaxis:  Indication(s): None identified Antibiotic given: None  Post-operative Assessment:  EBL: None Complications: No immediate post-treatment complications observed by team, or reported by patient. Note: The patient tolerated the entire procedure well. A repeat set of vitals were taken after the procedure and the patient was kept under observation following institutional policy, for this type of procedure. Post-procedural neurological assessment was performed, showing return to baseline, prior to discharge. The patient was provided with post-procedure discharge instructions, including a section on how to identify potential problems. Should any problems arise concerning this procedure, the patient was given instructions to immediately contact us, at any time, without hesitation. In any case, we plan to contact the patient by telephone for a follow-up status report regarding this interventional procedure. Comments:  No additional relevant information.  Plan of Care    Imaging Orders     DG C-Arm 1-60 Min-No Report  Procedure Orders     Lumbar Epidural Injection  Medications ordered for procedure: Meds ordered this encounter  Medications  . DISCONTD: lactated ringers infusion 1,000 mL  . DISCONTD: midazolam (VERSED) 5 MG/5ML injection 1-2 mg    Make sure Flumazenil is available in the pyxis when using this medication. If oversedation occurs, administer 0.2 mg IV over 15 sec. If after 45 sec no response, administer 0.2 mg again over 1 min; may repeat at 1 min intervals; not to exceed 4 doses (1 mg)  . DISCONTD: fentaNYL (SUBLIMAZE) injection 25-50 mcg    Make sure Narcan is available in the pyxis when using this  medication. In the event of respiratory depression (RR< 8/min): Titrate NARCAN (naloxone) in increments of 0.1 to 0.2 mg IV at 2-3 minute intervals, until desired degree of reversal.  . iopamidol (ISOVUE-M) 41 % intrathecal injection 10 mL  . triamcinolone acetonide (KENALOG-40) injection 40 mg  . ropivacaine (PF) 2 mg/mL (0.2%) (NAROPIN) injection 2 mL  .  sodium chloride flush (NS) 0.9 % injection 2 mL  . lidocaine (XYLOCAINE) 2 % (with pres) injection 200 mg   Medications administered: We administered iopamidol, triamcinolone acetonide, ropivacaine (PF) 2 mg/mL (0.2%), sodium chloride flush, and lidocaine.  See the medical record for exact dosing, route, and time of administration.  This SmartLink is deprecated. Use AVSMEDLIST instead to display the medication list for a patient. Disposition: Discharge home  Discharge Date & Time: 09/18/2017; 1115 hrs.   Physician-requested Follow-up: Return for post-procedure eval by Dr. Dossie Arbour in 2 wks. Future Appointments  Date Time Provider [place]  09/29/2017  1:30 PM Milinda Pointer, MD [facility] None   Primary Care Physician: Derinda Late, MD Location: [facility] Outpatient [facility] Management Facility Note by: Gaspar Cola, MD Date: 09/18/2017; Time: 5:30 PM  Disclaimer:  Medicine is not an Chief Strategy Officer. The only guarantee in medicine is that nothing is guaranteed. It is important to note that the decision to proceed with this intervention was based on the information collected from the patient. The Data and conclusions were drawn from the patient's questionnaire, the interview, and the physical examination. Because the information was provided in large part by the patient, it cannot be guaranteed that it has not been purposely or unconsciously manipulated. Every effort has been made to obtain as much relevant data as possible for this evaluation. It is important to note that the conclusions that lead to this procedure are derived in  large part from the available data. Always take into account that the treatment will also be dependent on availability of resources and existing treatment guidelines, considered by other Pain Management Practitioners as being common knowledge and practice, at the time of the intervention. For Medico-Legal purposes, it is also important to point out that variation in procedural techniques and pharmacological choices are the acceptable norm. The indications, contraindications, technique, and results of the above procedure should only be interpreted and judged by a Board-Certified Interventional Pain Specialist with extensive familiarity and expertise in the same exact procedure and technique.

## 2017-09-18 ENCOUNTER — Ambulatory Visit (HOSPITAL_BASED_OUTPATIENT_CLINIC_OR_DEPARTMENT_OTHER): Payer: Medicare Other | Admitting: Pain Medicine

## 2017-09-18 ENCOUNTER — Encounter: Payer: Self-pay | Admitting: Pain Medicine

## 2017-09-18 ENCOUNTER — Other Ambulatory Visit: Payer: Self-pay

## 2017-09-18 ENCOUNTER — Ambulatory Visit
Admission: RE | Admit: 2017-09-18 | Discharge: 2017-09-18 | Disposition: A | Discharge status: Home / Self Care | Payer: Medicare Other | Source: Ambulatory Visit | Attending: Pain Medicine | Admitting: Pain Medicine

## 2017-09-18 VITALS — BP 177/84 | HR 75 | Temp 98.0°F | Resp 16 | Ht 60.0 in | Wt 125.0 lb

## 2017-09-18 DIAGNOSIS — M25551 Pain in right hip: Secondary | ICD-10-CM

## 2017-09-18 DIAGNOSIS — M25552 Pain in left hip: Secondary | ICD-10-CM | POA: Insufficient documentation

## 2017-09-18 DIAGNOSIS — G8929 Other chronic pain: Secondary | ICD-10-CM

## 2017-09-18 DIAGNOSIS — M533 Sacrococcygeal disorders, not elsewhere classified: Secondary | ICD-10-CM | POA: Insufficient documentation

## 2017-09-18 DIAGNOSIS — M5136 Other intervertebral disc degeneration, lumbar region: Secondary | ICD-10-CM | POA: Insufficient documentation

## 2017-09-18 DIAGNOSIS — M545 Low back pain: Secondary | ICD-10-CM | POA: Diagnosis not present

## 2017-09-18 DIAGNOSIS — Z7902 Long term (current) use of antithrombotics/antiplatelets: Secondary | ICD-10-CM | POA: Diagnosis not present

## 2017-09-18 DIAGNOSIS — M47816 Spondylosis without myelopathy or radiculopathy, lumbar region: Secondary | ICD-10-CM | POA: Insufficient documentation

## 2017-09-18 MED ORDER — TRIAMCINOLONE ACETONIDE 40 MG/ML IJ SUSP
40.0000 mg | Freq: Once | INTRAMUSCULAR | Status: AC
  Administered 2017-09-18: 40 mg
  Filled 2017-09-18: qty 1

## 2017-09-18 MED ORDER — LACTATED RINGERS IV SOLN: 1000.0000 mL | Freq: Once | INTRAVENOUS | Status: DC

## 2017-09-18 MED ORDER — SODIUM CHLORIDE 0.9% FLUSH
2.0000 mL | Freq: Once | INTRAVENOUS | Status: AC
Start: 2017-09-18 — End: 2017-09-18
  Administered 2017-09-18: 10 mL

## 2017-09-18 MED ORDER — MIDAZOLAM HCL 5 MG/5ML IJ SOLN: 1.0000 mg | INTRAMUSCULAR | Status: DC | PRN

## 2017-09-18 MED ORDER — IOPAMIDOL (ISOVUE-M 200) INJECTION 41%
10.0000 mL | Freq: Once | INTRAMUSCULAR | Status: AC
  Administered 2017-09-18: 10 mL via EPIDURAL
  Filled 2017-09-18: qty 10

## 2017-09-18 MED ORDER — ROPIVACAINE HCL 2 MG/ML IJ SOLN
2.0000 mL | Freq: Once | INTRAMUSCULAR | Status: AC
  Administered 2017-09-18: 10 mL via EPIDURAL
  Filled 2017-09-18: qty 10

## 2017-09-18 MED ORDER — LIDOCAINE HCL 2 % IJ SOLN
10.0000 mL | Freq: Once | INTRAMUSCULAR | Status: AC
  Administered 2017-09-18: 400 mg
  Filled 2017-09-18: qty 20

## 2017-09-18 MED ORDER — FENTANYL CITRATE (PF) 100 MCG/2ML IJ SOLN: 25.0000 ug | INTRAMUSCULAR | Status: DC | PRN

## 2017-09-18 NOTE — Patient Instructions (Signed)

## 2017-09-19 ENCOUNTER — Telehealth: Payer: Self-pay

## 2017-09-19 NOTE — Telephone Encounter (Signed)
Post procedure phone call.  States she is doing well.

## 2017-09-29 ENCOUNTER — Ambulatory Visit: Payer: Medicare Other | Admitting: Pain Medicine

## 2017-10-19 NOTE — Progress Notes (Signed)
Patient's Name: Destiny [name]  MRN: [id number]  Referring Provider: Derinda Late, MD  DOB: [birth year]  PCP: Derinda Late, MD  DOS: 10/20/2017  Note by: Gaspar Cola, MD  Service setting: Ambulatory outpatient  Specialty: Interventional Pain Management  Location: [facility] (AMB) Pain Management Facility    Patient type: Established   Primary Reason(s) for Visit: Encounter for post-procedure evaluation of chronic illness with mild to moderate exacerbation CC: No chief complaint on file.  HPI  [name] is a 84 y.o. year old, female patient, who comes today for a post-procedure evaluation. She has Chronic myofascial pain; Esophageal reflux; Benign essential hypertension; Migraine; Other and unspecified hyperlipidemia; Pseudophakia, both eyes; Salzmann's nodular dystrophy; Type II or unspecified type diabetes mellitus without mention of complication, not stated as uncontrolled; CVA (cerebral vascular accident) ([employer]); Diabetes mellitus type 2, uncomplicated ([place]); Chronic low back pain (Primary Area of Pain) (Bilateral) (L>R); Chronic sacroiliac joint pain (Bilateral) (L>R); Chronic hip pain (Primary Area of Pain) (Bilateral) (L>R); Chronic pain syndrome; Osteoarthritis of sacroiliac joints (Bilateral); Osteoarthritis of hip (Bilateral); Osteoarthritis of lumbar spine; Lumbar spondylosis; Long term current use of anticoagulant (Plavix); DDD (degenerative disc disease), lumbar; Lumbar facet hypertrophy; Lumbar facet syndrome (Bilateral); and Trochanteric bursitis of hip (Right) on their problem list. Her primarily concern today is the No chief complaint on file.  Pain Assessment: Location: Right Back Radiating: right hip Onset: More than a month ago Duration: Chronic pain Quality: Aching, Sore, Discomfort(Stiff) Severity: 3 /10 (self-reported pain score)  Note: Reported level is compatible with observation.                         When using our objective Pain Scale, levels between 6 and 10/10 are  said to belong in an emergency room, as it progressively worsens from a 6/10, described as severely limiting, requiring emergency care not usually available at an outpatient pain management facility. At a 6/10 level, communication becomes difficult and requires great effort. Assistance to reach the emergency department may be required. Facial flushing and profuse sweating along with potentially dangerous increases in heart rate and blood pressure will be evident. Effect on ADL: difficulty sleeping, vacuuming, bending,  Timing: Constant Modifying factors: rest, heat, ice pack  [name] comes in today for post-procedure evaluation after the treatment done on 09/18/2017.  The patient has done extremely well after the lumbar epidural steroid injection, however, she continues to have some pain in the right lower back and right hip area.  Physical exam today was positive for intra-articular hip joint pain on the right side and SI joint pain on a positive Patrick maneuver.  In addition, the patient has tenderness to palpation over the trochanteric bursa on the right side.  I will bring the patient back for a diagnostic right SI joint injection + diagnostic right intra-articular hip joint injection + diagnostic right trochanteric bursa injection under fluoroscopic guidance.  Patient has indicated that she would prefer to do it without sedation.  Further details on both, my assessment(s), as well as the proposed treatment plan, please see below.  Post-Procedure Assessment  09/18/2017 Procedure: Diagnostic right L5-S1 Inter-Laminar Epidural Steroid Injection, undere fluoro, no sedation Pre-procedure pain score:  7/10 Post-procedure pain score: 3/10 (> 50% relief) Influential Factors: BMI: 24.41 kg/m Intra-procedural challenges: None observed.         Assessment challenges: None detected.              Reported side-effects: None.  Post-procedural adverse reactions or complications: None reported          Sedation: No sedation used. When no sedatives are used, the analgesic levels obtained are directly associated to the effectiveness of the local anesthetics. However, when sedation is provided, the level of analgesia obtained during the initial 1 hour following the intervention, is believed to be the result of a combination of factors. These factors may include, but are not limited to: 1. The effectiveness of the local anesthetics used. 2. The effects of the analgesic(s) and/or anxiolytic(s) used. 3. The degree of discomfort experienced by the patient at the time of the procedure. 4. The patients ability and reliability in recalling and recording the events. 5. The presence and influence of possible secondary gains and/or psychosocial factors. Reported result: Relief experienced during the 1st hour after the procedure: 100 % (Ultra-Short Term Relief)            Interpretative annotation: Clinically appropriate result. No IV Analgesic or Anxiolytic given, therefore benefits are completely due to Local Anesthetic effects.          Effects of local anesthetic: The analgesic effects attained during this period are directly associated to the localized infiltration of local anesthetics and therefore cary significant diagnostic value as to the etiological location, or anatomical origin, of the pain. Expected duration of relief is directly dependent on the pharmacodynamics of the local anesthetic used. Long-acting (4-6 hours) anesthetics used.  Reported result: Relief during the next 4 to 6 hour after the procedure: 80 % (Short-Term Relief)            Interpretative annotation: Clinically appropriate result. Analgesia during this period is likely to be Local Anesthetic-related.          Long-term benefit: Defined as the period of time past the expected duration of local anesthetics (1 hour for short-acting and 4-6 hours for long-acting). With the possible exception of prolonged sympathetic blockade from the  local anesthetics, benefits during this period are typically attributed to, or associated with, other factors such as analgesic sensory neuropraxia, antiinflammatory effects, or beneficial biochemical changes provided by agents other than the local anesthetics.  Reported result: Extended relief following procedure: 50 % (Long-Term Relief)            Interpretative annotation: Clinically appropriate result. Good relief. No permanent benefit expected. Inflammation plays a part in the etiology to the pain.          Current benefits: Defined as reported results that persistent at this point in time.   Analgesia: >50 % Ms. Scarola reports that both, extremity and the axial pain improved with the treatment. Function: Somewhat improved ROM: Somewhat improved Interpretative annotation: Recurrence of symptoms. No permanent benefit expected. Effective diagnostic intervention.          Interpretation: Results would suggest a successful diagnostic intervention.                  Plan:  Set up procedure as a PRN palliative treatment option for this patient. Schedule her for right sacroiliac joint injection + right intra-articular hip joint injection + right trochanteric bursa injection.  Laboratory Chemistry  Inflammation Markers (CRP: Acute Phase) (ESR: Chronic Phase) No results found for: CRP, ESRSEDRATE, LATICACIDVEN               Rheumatology Markers No results found for: RF, ANA, LABURIC, URICUR, LYMEIGGIGMAB, LYMEABIGMQN              Renal Function Markers Lab Results  Component Value Date   BUN 15 07/29/2017   CREATININE 0.71 07/29/2017   GFRAA >60 07/29/2017   GFRNONAA >60 07/29/2017                 Hepatic Function Markers Lab Results  Component Value Date   AST 23 02/09/2013   ALT 13 02/09/2013   ALBUMIN 3.9 02/09/2013   ALKPHOS 87 02/09/2013                 Electrolytes Lab Results  Component Value Date   NA 142 07/29/2017   K 4.3 07/29/2017   CL 103 07/29/2017   CALCIUM 9.7  07/29/2017                 Neuropathy Markers Lab Results  Component Value Date   HGBA1C 6.7 (H) 07/30/2017                 Bone Pathology Markers No results found for: [place], [id number], [id number], [id number], 25OHVITD1, 25OHVITD2, 25OHVITD3, TESTOFREE, TESTOSTERONE               Coagulation Parameters Lab Results  Component Value Date   INR 0.94 07/30/2017   LABPROT 12.5 07/30/2017   APTT 34 07/30/2017   PLT 260 07/29/2017                 Cardiovascular Markers Lab Results  Component Value Date   TROPONINI <0.03 07/29/2017   HGB 13.1 07/29/2017   HCT 39.7 07/29/2017                 CA Markers No results found for: CEA, CA125, LABCA2               Note: Lab results reviewed.  Recent Diagnostic Imaging Results  DG C-Arm 1-60 Min-No Report Fluoroscopy was utilized by the requesting physician.  No radiographic  interpretation.   Complexity Note: Imaging results reviewed. Results shared with Ms. Dusek, using Layman's terms.                         Meds   Current Outpatient Medications:  .  aspirin EC 81 MG tablet, Take 1 tablet (81 mg total) by mouth daily., Disp: 100 tablet, Rfl: 0 .  clopidogrel (PLAVIX) 75 MG tablet, Take 1 tablet (75 mg total) by mouth daily., Disp: 30 tablet, Rfl: 0 .  doxepin (SINEQUAN) 10 MG capsule, TAKE ONE CAPSULE BY MOUTH EVERY NIGHT AT BEDTIME, Disp: , Rfl:  .  glucose blood (ONE TOUCH ULTRA TEST) test strip, EVERY DAY, Disp: , Rfl:  .  metFORMIN (GLUCOPHAGE) 500 MG tablet, TAKE ONE (1) TABLET BY MOUTH EVERY DAY, Disp: , Rfl:  .  ONETOUCH DELICA LANCETS FINE MISC, EVERY DAY, Disp: , Rfl:  .  telmisartan (MICARDIS) 80 MG tablet, Take 1 tablet by mouth daily., Disp: , Rfl:  .  amLODipine (NORVASC) 2.5 MG tablet, Take 1 tablet (2.5 mg total) by mouth daily. Take once daily if blood pressure > 160, Disp: 14 tablet, Rfl: 0 .  fluticasone (FLONASE) 50 MCG/ACT nasal spray, Place into the nose as needed. , Disp: , Rfl:   ROS  Constitutional:  Denies any fever or chills Gastrointestinal: No reported hemesis, hematochezia, vomiting, or acute GI distress Musculoskeletal: Denies any acute onset joint swelling, redness, loss of ROM, or weakness Neurological: No reported episodes of acute onset apraxia, aphasia, dysarthria, agnosia, amnesia, paralysis, loss of coordination, or loss of consciousness  Allergies  Ms. Hebdon  is allergic to penicillins; ampicillin; hydrocodone-homatropine; prednisone; amoxicillin; and tramadol.  PFSH  Drug: Ms. Revard  reports that she does not use drugs. Alcohol:  reports that she does not drink alcohol. Tobacco:  reports that  has never smoked. she has never used smokeless tobacco. Medical:  has a past medical history of Allergy, Diabetes mellitus without complication ([place]), Hyperlipidemia, Shingles, and Stroke ([place]). Surgical: Ms. Harl  has a past surgical history that includes Joint replacement; Knee surgery (Bilateral); Back surgery; Appendectomy; Tonsilectomy, adenoidectomy, bilateral myringotomy and tubes; and Foot surgery (Bilateral). Family: family history includes Stroke in her father and mother.  Constitutional Exam  General appearance: Well nourished, well developed, and well hydrated. In no apparent acute distress Vitals:   10/20/17 0810  BP: (!) 140/57  Pulse: 78  Resp: 16  Temp: 97.6 F (36.4 C)  SpO2: 100%  Weight: 125 lb (56.7 kg)  Height: 5' (1.524 m)   BMI Assessment: Estimated body mass index is 24.41 kg/m as calculated from the following:   Height as of this encounter: 5' (1.524 m).   Weight as of this encounter: 125 lb (56.7 kg).  BMI interpretation table: BMI level Category Range association with higher incidence of chronic pain  <18 kg/m2 Underweight   18.5-24.9 kg/m2 Ideal body weight   25-29.9 kg/m2 Overweight Increased incidence by 20%  30-34.9 kg/m2 Obese (Class I) Increased incidence by 68%  35-39.9 kg/m2 Severe obesity (Class II) Increased incidence by 136%  >40  kg/m2 Extreme obesity (Class III) Increased incidence by 254%   BMI Readings from Last 4 Encounters:  10/20/17 24.41 kg/m  09/18/17 24.41 kg/m  09/08/17 25.39 kg/m  07/30/17 25.74 kg/m   Wt Readings from Last 4 Encounters:  10/20/17 125 lb (56.7 kg)  09/18/17 125 lb (56.7 kg)  09/08/17 130 lb (59 kg)  07/30/17 131 lb 12.8 oz (59.8 kg)  Psych/Mental status: Alert, oriented x 3 (person, place, & time)       Eyes: PERLA Respiratory: No evidence of acute respiratory distress  Cervical Spine Area Exam  Skin & Axial Inspection: No masses, redness, edema, swelling, or associated skin lesions Alignment: Symmetrical Functional ROM: Unrestricted ROM      Stability: No instability detected Muscle Tone/Strength: Functionally intact. No obvious neuro-muscular anomalies detected. Sensory (Neurological): Unimpaired Palpation: No palpable anomalies              Upper Extremity (UE) Exam    Side: Right upper extremity  Side: Left upper extremity  Skin & Extremity Inspection: Skin color, temperature, and hair growth are WNL. No peripheral edema or cyanosis. No masses, redness, swelling, asymmetry, or associated skin lesions. No contractures.  Skin & Extremity Inspection: Skin color, temperature, and hair growth are WNL. No peripheral edema or cyanosis. No masses, redness, swelling, asymmetry, or associated skin lesions. No contractures.  Functional ROM: Unrestricted ROM          Functional ROM: Unrestricted ROM          Muscle Tone/Strength: Functionally intact. No obvious neuro-muscular anomalies detected.  Muscle Tone/Strength: Functionally intact. No obvious neuro-muscular anomalies detected.  Sensory (Neurological): Unimpaired          Sensory (Neurological): Unimpaired          Palpation: No palpable anomalies              Palpation: No palpable anomalies              Specialized Test(s): Deferred  Specialized Test(s): Deferred          Thoracic Spine Area Exam  Skin & Axial  Inspection: No masses, redness, or swelling Alignment: Symmetrical Functional ROM: Unrestricted ROM Stability: No instability detected Muscle Tone/Strength: Functionally intact. No obvious neuro-muscular anomalies detected. Sensory (Neurological): Unimpaired Muscle strength & Tone: No palpable anomalies  Lumbar Spine Area Exam  Skin & Axial Inspection: No masses, redness, or swelling Alignment: Symmetrical Functional ROM: Restricted ROM      Stability: No instability detected Muscle Tone/Strength: Functionally intact. No obvious neuro-muscular anomalies detected. Sensory (Neurological): Unimpaired Palpation: No palpable anomalies       Provocative Tests: Lumbar Hyperextension and rotation test: evaluation deferred today       Lumbar Lateral bending test: evaluation deferred today       Patrick's Maneuver: Positive for right-sided S-I arthralgia and for right hip arthralgia  Gait & Posture Assessment  Ambulation: Limited Gait: Relatively normal for age and body habitus Posture: Antalgic   Lower Extremity Exam    Side: Right lower extremity  Side: Left lower extremity  Skin & Extremity Inspection: Skin color, temperature, and hair growth are WNL. No peripheral edema or cyanosis. No masses, redness, swelling, asymmetry, or associated skin lesions. No contractures.  Skin & Extremity Inspection: Skin color, temperature, and hair growth are WNL. No peripheral edema or cyanosis. No masses, redness, swelling, asymmetry, or associated skin lesions. No contractures.  Functional ROM: Unrestricted ROM          Functional ROM: Unrestricted ROM          Muscle Tone/Strength: Functionally intact. No obvious neuro-muscular anomalies detected.  Muscle Tone/Strength: Functionally intact. No obvious neuro-muscular anomalies detected.  Sensory (Neurological): Unimpaired  Sensory (Neurological): Unimpaired  Palpation: No palpable anomalies  Palpation: No palpable anomalies   Assessment  Primary  Diagnosis & Pertinent Problem List: The primary encounter diagnosis was Chronic low back pain (Primary Area of Pain) (Bilateral) (L>R). Diagnoses of Chronic sacroiliac joint pain (Bilateral) (L>R), Lumbar facet syndrome (Bilateral), Chronic pain syndrome, Chronic hip pain (Primary Area of Pain) (Bilateral) (L>R), Trochanteric bursitis of hip (Right), and Long term current use of anticoagulant (Plavix) were also pertinent to this visit.  Status Diagnosis  Improved Persistent Controlled 1. Chronic low back pain (Primary Area of Pain) (Bilateral) (L>R)   2. Chronic sacroiliac joint pain (Bilateral) (L>R)   3. Lumbar facet syndrome (Bilateral)   4. Chronic pain syndrome   5. Chronic hip pain (Primary Area of Pain) (Bilateral) (L>R)   6. Trochanteric bursitis of hip (Right)   7. Long term current use of anticoagulant (Plavix)     Problems updated and reviewed during this visit: Problem  Trochanteric bursitis of hip (Right)   Plan of Care  Pharmacotherapy (Medications Ordered): No orders of the defined types were placed in this encounter.  Medications administered today: Jamal T. Fehring had no medications administered during this visit.   Procedure Orders     HIP INJECTION     SACROILIAC JOINT INJECTION Lab Orders  No laboratory test(s) ordered today   Imaging Orders  No imaging studies ordered today   Referral Orders  No referral(s) requested today   Interventional therapies: Planned, scheduled, and/or pending:   NOTE: Stop Plavix x 7 days prior to procedures. Diagnostic right sacroiliac joint injection + right intra-articular hip joint injection + right trochanteric bursa injection.   Considering:   Diagnostic Bilateral Lumbar Facet NB Diagnostic Bilateral Hip injections   Palliative PRN treatment(s):   LeftPRNpalliative sacroiliac joint  injections    Provider-requested follow-up: Return for Procedure (no sedation): (R) SI + (R) Hip inj, (Blood-thinner  Protocol).  Future Appointments  Date Time Provider [place]  10/28/2017  7:45 AM Milinda Pointer, MD [facility] None   Primary Care Physician: Derinda Late, MD Location: [facility] Note by: Gaspar Cola, MD Date: 10/20/2017; Time: 12:40 PM

## 2017-10-20 ENCOUNTER — Other Ambulatory Visit: Payer: Self-pay

## 2017-10-20 ENCOUNTER — Ambulatory Visit: Payer: Medicare Other | Attending: Pain Medicine | Admitting: Pain Medicine

## 2017-10-20 ENCOUNTER — Encounter: Payer: Self-pay | Admitting: Pain Medicine

## 2017-10-20 VITALS — BP 140/57 | HR 78 | Temp 97.6°F | Resp 16 | Ht 60.0 in | Wt 125.0 lb

## 2017-10-20 DIAGNOSIS — E785 Hyperlipidemia, unspecified: Secondary | ICD-10-CM | POA: Diagnosis not present

## 2017-10-20 DIAGNOSIS — Z7984 Long term (current) use of oral hypoglycemic drugs: Secondary | ICD-10-CM | POA: Insufficient documentation

## 2017-10-20 DIAGNOSIS — M545 Low back pain: Secondary | ICD-10-CM | POA: Diagnosis not present

## 2017-10-20 DIAGNOSIS — M7071 Other bursitis of hip, right hip: Secondary | ICD-10-CM | POA: Insufficient documentation

## 2017-10-20 DIAGNOSIS — Z961 Presence of intraocular lens: Secondary | ICD-10-CM | POA: Insufficient documentation

## 2017-10-20 DIAGNOSIS — G43909 Migraine, unspecified, not intractable, without status migrainosus: Secondary | ICD-10-CM | POA: Diagnosis not present

## 2017-10-20 DIAGNOSIS — G8929 Other chronic pain: Secondary | ICD-10-CM | POA: Diagnosis not present

## 2017-10-20 DIAGNOSIS — M25552 Pain in left hip: Secondary | ICD-10-CM | POA: Diagnosis not present

## 2017-10-20 DIAGNOSIS — M160 Bilateral primary osteoarthritis of hip: Secondary | ICD-10-CM | POA: Insufficient documentation

## 2017-10-20 DIAGNOSIS — M533 Sacrococcygeal disorders, not elsewhere classified: Secondary | ICD-10-CM | POA: Insufficient documentation

## 2017-10-20 DIAGNOSIS — K219 Gastro-esophageal reflux disease without esophagitis: Secondary | ICD-10-CM | POA: Diagnosis not present

## 2017-10-20 DIAGNOSIS — Z79899 Other long term (current) drug therapy: Secondary | ICD-10-CM | POA: Insufficient documentation

## 2017-10-20 DIAGNOSIS — M7918 Myalgia, other site: Secondary | ICD-10-CM | POA: Insufficient documentation

## 2017-10-20 DIAGNOSIS — G894 Chronic pain syndrome: Secondary | ICD-10-CM | POA: Insufficient documentation

## 2017-10-20 DIAGNOSIS — Z7902 Long term (current) use of antithrombotics/antiplatelets: Secondary | ICD-10-CM | POA: Diagnosis not present

## 2017-10-20 DIAGNOSIS — I10 Essential (primary) hypertension: Secondary | ICD-10-CM | POA: Insufficient documentation

## 2017-10-20 DIAGNOSIS — E119 Type 2 diabetes mellitus without complications: Secondary | ICD-10-CM | POA: Insufficient documentation

## 2017-10-20 DIAGNOSIS — Z7901 Long term (current) use of anticoagulants: Secondary | ICD-10-CM | POA: Diagnosis not present

## 2017-10-20 DIAGNOSIS — M25551 Pain in right hip: Secondary | ICD-10-CM | POA: Diagnosis not present

## 2017-10-20 DIAGNOSIS — Z880 Allergy status to penicillin: Secondary | ICD-10-CM | POA: Diagnosis not present

## 2017-10-20 DIAGNOSIS — M5136 Other intervertebral disc degeneration, lumbar region: Secondary | ICD-10-CM | POA: Insufficient documentation

## 2017-10-20 DIAGNOSIS — Z8673 Personal history of transient ischemic attack (TIA), and cerebral infarction without residual deficits: Secondary | ICD-10-CM | POA: Insufficient documentation

## 2017-10-20 DIAGNOSIS — M47816 Spondylosis without myelopathy or radiculopathy, lumbar region: Secondary | ICD-10-CM | POA: Diagnosis not present

## 2017-10-20 DIAGNOSIS — Z7982 Long term (current) use of aspirin: Secondary | ICD-10-CM | POA: Insufficient documentation

## 2017-10-20 DIAGNOSIS — M7061 Trochanteric bursitis, right hip: Secondary | ICD-10-CM | POA: Diagnosis not present

## 2017-10-20 NOTE — Progress Notes (Signed)
Safety precautions to be maintained throughout the outpatient stay will include: orient to surroundings, keep bed in low position, maintain call bell within reach at all times, provide assistance with transfer out of bed and ambulation.  

## 2017-10-20 NOTE — Patient Instructions (Signed)
____________________________________________________________________________________________  Preparing for your procedure (without sedation) Instructions: . Oral Intake: Do not eat or drink anything for at least 3 hours prior to your procedure. . Transportation: Unless otherwise stated by your physician, you may drive yourself after the procedure. . Blood Pressure Medicine: Take your blood pressure medicine with a sip of water the morning of the procedure. . Blood thinners:  . Diabetics on insulin: Notify the staff so that you can be scheduled 1st case in the morning. If your diabetes requires high dose insulin, take only  of your normal insulin dose the morning of the procedure and notify the staff that you have done so. . Preventing infections: Shower with an antibacterial soap the morning of your procedure.  . Build-up your immune system: Take 1000 mg of Vitamin C with every meal (3 times a day) the day prior to your procedure. . Antibiotics: Inform the staff if you have a condition or reason that requires you to take antibiotics before dental procedures. . Pregnancy: If you are pregnant, call and cancel the procedure. . Sickness: If you have a cold, fever, or any active infections, call and cancel the procedure. . Arrival: You must be in the facility at least 30 minutes prior to your scheduled procedure. . Children: Do not bring any children with you. . Dress appropriately: Bring dark clothing that you would not mind if they get stained. . Valuables: Do not bring any jewelry or valuables. Procedure appointments are reserved for interventional treatments only. . No Prescription Refills. . No medication changes will be discussed during procedure appointments. . No disability issues will be discussed. ____________________________________________________________________________________________   

## 2017-10-27 NOTE — Progress Notes (Signed)
Patient's Name: Destiny [name]  MRN: [id number]  Referring Provider: Derinda Late, MD  DOB: 29-Mar-1924  PCP: Derinda Late, MD  DOS: 10/28/2017  Note by: Gaspar Cola, MD  Service setting: Ambulatory outpatient  Specialty: Interventional Pain Management  Patient type: Established  Location: [facility] (AMB) Pain Management Facility  Visit type: Interventional Procedure   Primary Reason for Visit: Interventional Pain Management Treatment. CC: Back Pain and Hip Pain (right)  Procedure #1:  Anesthesia, Analgesia, Anxiolysis:  Type: Diagnostic Sacroiliac Joint Steroid Injection Region: Superior Lumbosacral Region Level: PSIS (Posterior Superior Iliac Spine) Laterality: Right-Sided  Type: Local Anesthesia with Moderate (Conscious) Sedation Local Anesthetic: Lidocaine 1% Route: Intravenous (IV) IV Access: Secured Sedation: Meaningful verbal contact was maintained at all times during the procedure  Indication(s): Analgesia and Anxiety   Indications: 1. Chronic sacroiliac joint pain (Bilateral)  2. Osteoarthritis of sacroiliac joints (Bilateral)    Procedure #2:   Type: Therapeutic Intra-Articular Hip Injection Region:  Posterolateral hip joint area. Level: Lower pelvic and hip joint level. Laterality: Right-Sided    Indications: 1. Chronic hip pain (Primary Area of Pain) (Bilateral)  2. Osteoarthritis of hip (Bilateral)    Procedure #3:   Type: Therapeutic Deep Trochanteric Bursa Injection Region: Greater Femoral Trochanter Region Level: Hip Joint Laterality: Right    Indications: 1. Trochanteric bursitis of hip (Right)    Pain Score: Pre-procedure: 4 /10 Post-procedure: 0-No pain/10  Pre-op Assessment:  Destiny [name] is a 84 y.o. (year old), female patient, seen today for interventional treatment. She  has a past surgical history that includes Joint replacement; Knee surgery (Bilateral); Back surgery; Appendectomy; Tonsilectomy, adenoidectomy, bilateral myringotomy and tubes;  and Foot surgery (Bilateral). Destiny [name] has a current medication list which includes the following prescription(s): aspirin ec, clopidogrel, doxepin, glucose blood, metformin, onetouch delica lancets fine, telmisartan, amlodipine, and fluticasone, and the following Facility-Administered Medications: fentanyl and midazolam. Her primarily concern today is the Back Pain and Hip Pain (right)  Initial Vital Signs: There were no vitals taken for this visit. BMI: Estimated body mass index is 24.41 kg/m as calculated from the following:   Height as of this encounter: 5' (1.524 m).   Weight as of this encounter: 125 lb (56.7 kg).  Risk Assessment: Allergies: Reviewed. She is allergic to penicillins; ampicillin; hydrocodone-homatropine; prednisone; amoxicillin; and tramadol.  Allergy Precautions: None required Coagulopathies: Reviewed. None identified.  Blood-thinner therapy: None at this time Active Infection(s): Reviewed. None identified. Destiny [name] is afebrile  Site Confirmation: Destiny [name] was asked to confirm the procedure and laterality before marking the site Procedure checklist: Completed Consent: Before the procedure and under the influence of no sedative(s), amnesic(s), or anxiolytics, the patient was informed of the treatment options, risks and possible complications. To fulfill our ethical and legal obligations, as recommended by the American Medical Association's Code of Ethics, I have informed the patient of my clinical impression; the nature and purpose of the treatment or procedure; the risks, benefits, and possible complications of the intervention; the alternatives, including doing nothing; the risk(s) and benefit(s) of the alternative treatment(s) or procedure(s); and the risk(s) and benefit(s) of doing nothing. The patient was provided information about the general risks and possible complications associated with the procedure. These may include, but are not limited to: failure to achieve  desired goals, infection, bleeding, organ or nerve damage, allergic reactions, paralysis, and death. In addition, the patient was informed of those risks and complications associated to the procedure, such as failure to decrease pain; infection; bleeding;  organ or nerve damage with subsequent damage to sensory, motor, and/or autonomic systems, resulting in permanent pain, numbness, and/or weakness of one or several areas of the body; allergic reactions; (i.e.: anaphylactic reaction); and/or death. Furthermore, the patient was informed of those risks and complications associated with the medications. These include, but are not limited to: allergic reactions (i.e.: anaphylactic or anaphylactoid reaction(s)); adrenal axis suppression; blood sugar elevation that in diabetics may result in ketoacidosis or comma; water retention that in patients with history of congestive heart failure may result in shortness of breath, pulmonary edema, and decompensation with resultant heart failure; weight gain; swelling or edema; medication-induced neural toxicity; particulate matter embolism and blood vessel occlusion with resultant organ, and/or nervous system infarction; and/or aseptic necrosis of one or more joints. Finally, the patient was informed that Medicine is not an exact science; therefore, there is also the possibility of unforeseen or unpredictable risks and/or possible complications that may result in a catastrophic outcome. The patient indicated having understood very clearly. We have given the patient no guarantees and we have made no promises. Enough time was given to the patient to ask questions, all of which were answered to the patient's satisfaction. Destiny [name] has indicated that she wanted to continue with the procedure. Attestation: I, the ordering provider, attest that I have discussed with the patient the benefits, risks, side-effects, alternatives, likelihood of achieving goals, and potential problems  during recovery for the procedure that I have provided informed consent. Date: 10/28/2017; Time: 9:39 AM  Pre-Procedure Preparation:  Monitoring: As per clinic protocol. Respiration, ETCO2, SpO2, BP, heart rate and rhythm monitor placed and checked for adequate function Safety Precautions: Patient was assessed for positional comfort and pressure points before starting the procedure. Time-out: I initiated and conducted the "Time-out" before starting the procedure, as per protocol. The patient was asked to participate by confirming the accuracy of the "Time Out" information. Verification of the correct person, site, and procedure were performed and confirmed by me, the nursing staff, and the patient. "Time-out" conducted as per Joint Commission's Universal Protocol (UP.01.01.01). "Time-out" Date & Time: 10/28/2017; 0852 hrs.  Description of Procedure #1 Process:  Position: Prone Target Area: Superior, posterior, aspect of the sacroiliac fissure Approach: Posterior, paraspinal, ipsilateral approach. Area Prepped: Entire Lower Lumbosacral Region Prepping solution: ChloraPrep (2% chlorhexidine gluconate and 70% isopropyl alcohol) Safety Precautions: Aspiration looking for blood return was conducted prior to all injections. At no point did we inject any substances, as a needle was being advanced. No attempts were made at seeking any paresthesias. Safe injection practices and needle disposal techniques used. Medications properly checked for expiration dates. SDV (single dose vial) medications used. Description of the Procedure: Protocol guidelines were followed. The patient was placed in position over the procedure table. The target area was identified and the area prepped in the usual manner. Skin & deeper tissues infiltrated with local anesthetic. Appropriate amount of time allowed to pass for local anesthetics to take effect. The procedure needle was advanced under fluoroscopic guidance into the sacroiliac  joint until a firm endpoint was obtained. Proper needle placement secured. Negative aspiration confirmed. Solution injected in intermittent fashion, asking for systemic symptoms every 0.5cc of injectate. The needles were then removed and the area cleansed, making sure to leave some of the prepping solution back to take advantage of its long term bactericidal properties. Vitals:   10/28/17 0904 10/28/17 0914 10/28/17 0924 10/28/17 0934  BP: (!) 151/69 (!) 143/71 (!) 141/61 (!) 144/62  Pulse:  Resp: 12 16 14 14   Temp:  (!) 97.4 F (36.3 C)  (!) 97.3 F (36.3 C)  TempSrc:      SpO2: 96% 93% 93% 93%  Weight:      Height:         Start Time: 0852 hrs. Materials:  Needle(s) Type: Regular needle Gauge: 22G Length: 3.5-in Medication(s): Please see chart orders for dosing details.  Description of Procedure #2 Process:   Position: Lateral Decubitus with bad side up Target Area: Superior aspect of the hip joint cavity, going thru the superior portion of the capsular ligament. Approach: Posterolateral approach. Area Prepped: Entire Posterolateral hip area. Prepping solution: ChloraPrep (2% chlorhexidine gluconate and 70% isopropyl alcohol) Safety Precautions: Aspiration looking for blood return was conducted prior to all injections. At no point did we inject any substances, as a needle was being advanced. No attempts were made at seeking any paresthesias. Safe injection practices and needle disposal techniques used. Medications properly checked for expiration dates. SDV (single dose vial) medications used. Description of the Procedure: Protocol guidelines were followed. The patient was placed in position over the fluoroscopy table. The target area was identified and the area prepped in the usual manner. Skin & deeper tissues infiltrated with local anesthetic. Appropriate amount of time allowed to pass for local anesthetics to take effect. The procedure needles were then advanced to the target  area. Proper needle placement secured. Negative aspiration confirmed. Solution injected in intermittent fashion, asking for systemic symptoms every 0.5cc of injectate. The needles were then removed and the area cleansed, making sure to leave some of the prepping solution back to take advantage of its long term bactericidal properties.  Materials:  Needle(s) Type: Regular needle Gauge: 22G Length: 3.5-in Medication(s): Please see chart orders for dosing details.  Description of Procedure #3 Process:  Position: Lateral Decubitus with bad side up Target Area: Superior aspect of the hip joint cavity, going thru the superior portion of the capsular ligament. Approach: Posterolateral approach. Area Prepped: Entire Posterolateral hip area. Prepping solution: ChloraPrep (2% chlorhexidine gluconate and 70% isopropyl alcohol) Safety Precautions: Aspiration looking for blood return was conducted prior to all injections. At no point did we inject any substances, as a needle was being advanced. No attempts were made at seeking any paresthesias. Safe injection practices and needle disposal techniques used. Medications properly checked for expiration dates. SDV (single dose vial) medications used. Description of the Procedure: Protocol guidelines were followed. The patient was placed in position over the procedure table. The target area was identified and the area prepped in the usual manner. Skin desensitized using vapocoolant spray. Skin & deeper tissues infiltrated with local anesthetic. Appropriate amount of time allowed to pass for local anesthetics to take effect. The procedure needles were then advanced to the target area. Proper needle placement secured. Negative aspiration confirmed. Solution injected in intermittent fashion, asking for systemic symptoms every 0.5cc of injectate. The needles were then removed and the area cleansed, making sure to leave some of the prepping solution back to take advantage of  its long term bactericidal properties.  End Time: 0903 hrs.  Materials:  Needle(s) Type: Regular needle Gauge: 22G Length: 3.5-in Medication(s): Please see chart orders for dosing details.  Imaging Guidance for procedure #1,2, & 3 (Non-Spinal):  Type of Imaging Technique: Fluoroscopy Guidance (Non-Spinal) Indication(s): Assistance in needle guidance and placement for procedures requiring needle placement in or near specific anatomical locations not easily accessible without such assistance. Exposure Time: Please see nurses notes. Contrast:  Before injecting any contrast, we confirmed that the patient did not have an allergy to iodine, shellfish, or radiological contrast. Once satisfactory needle placement was completed at the desired level, radiological contrast was injected. Contrast injected under live fluoroscopy. No contrast complications. See chart for type and volume of contrast used. Fluoroscopic Guidance: I was personally present during the use of fluoroscopy. "Tunnel Vision Technique" used to obtain the best possible view of the target area. Parallax error corrected before commencing the procedure. "Direction-depth-direction" technique used to introduce the needle under continuous pulsed fluoroscopy. Once target was reached, antero-posterior, oblique, and lateral fluoroscopic projection used confirm needle placement in all planes. Images permanently stored in EMR. Interpretation: I personally interpreted the imaging intraoperatively. Adequate needle placement confirmed in multiple planes. Appropriate spread of contrast into desired area was observed. No evidence of afferent or efferent intravascular uptake. Permanent images saved into the patient's record.  Antibiotic Prophylaxis:  Indication(s): None identified Antibiotic given: None  Post-operative Assessment:  EBL: None Complications: No immediate post-treatment complications observed by team, or reported by patient. Note: The  patient tolerated the entire procedure well. A repeat set of vitals were taken after the procedure and the patient was kept under observation following institutional policy, for this type of procedure. Post-procedural neurological assessment was performed, showing return to baseline, prior to discharge. The patient was provided with post-procedure discharge instructions, including a section on how to identify potential problems. Should any problems arise concerning this procedure, the patient was given instructions to immediately contact us, at any time, without hesitation. In any case, we plan to contact the patient by telephone for a follow-up status report regarding this interventional procedure. Comments:  No additional relevant information.  Plan of Care    Imaging Orders     DG C-Arm 1-60 Min-No Report  Procedure Orders     SACROILIAC JOINT INJECTION     HIP INJECTION  Medications ordered for procedure: Meds ordered this encounter  Medications  . iopamidol (ISOVUE-M) 41 % intrathecal injection 10 mL    Must be myelogram compatible.  . midazolam (VERSED) 5 MG/5ML injection 1-2 mg    Make sure Flumazenil is available in the pyxis when using this medication. If oversedation occurs, administer 0.2 mg IV over 15 sec. If after 45 sec no response, administer 0.2 mg again over 1 min; may repeat at 1 min intervals; not to exceed 4 doses (1 mg)  . fentaNYL (SUBLIMAZE) injection 25-50 mcg    Make sure Narcan is available in the pyxis when using this medication. In the event of respiratory depression (RR< 8/min): Titrate NARCAN (naloxone) in increments of 0.1 to 0.2 mg IV at 2-3 minute intervals, until desired degree of reversal.  . lactated ringers infusion 1,000 mL  . lidocaine (XYLOCAINE) 2 % (with pres) injection 400 mg  . ropivacaine (PF) 2 mg/mL (0.2%) (NAROPIN) injection 4 mL  . methylPREDNISolone acetate (DEPO-MEDROL) injection 80 mg  . ropivacaine (PF) 2 mg/mL (0.2%) (NAROPIN) injection  9 mL  . methylPREDNISolone acetate (DEPO-MEDROL) injection 80 mg   Medications administered: We administered iopamidol, midazolam, fentaNYL, lactated ringers, lidocaine, ropivacaine (PF) 2 mg/mL (0.2%), methylPREDNISolone acetate, ropivacaine (PF) 2 mg/mL (0.2%), and methylPREDNISolone acetate.  See the medical record for exact dosing, route, and time of administration.  New Prescriptions   No medications on file   Disposition: Discharge home  Discharge Date & Time: 10/28/2017; 0940 hrs.   Physician-requested Follow-up: Return for post-procedure eval (2 wks), w/ Dr. Dossie Arbour. Future Appointments  Date  Time Provider [place]  11/10/2017  1:15 PM Milinda Pointer, MD [facility] None   Primary Care Physician: Derinda Late, MD Location: [facility] Note by: Gaspar Cola, MD Date: 10/28/2017; Time: 10:24 AM  Disclaimer:  Medicine is not an exact science. The only guarantee in medicine is that nothing is guaranteed. It is important to note that the decision to proceed with this intervention was based on the information collected from the patient. The Data and conclusions were drawn from the patient's questionnaire, the interview, and the physical examination. Because the information was provided in large part by the patient, it cannot be guaranteed that it has not been purposely or unconsciously manipulated. Every effort has been made to obtain as much relevant data as possible for this evaluation. It is important to note that the conclusions that lead to this procedure are derived in large part from the available data. Always take into account that the treatment will also be dependent on availability of resources and existing treatment guidelines, considered by other Pain Management Practitioners as being common knowledge and practice, at the time of the intervention. For Medico-Legal purposes, it is also important to point out that variation in  procedural techniques and pharmacological choices are the acceptable norm. The indications, contraindications, technique, and results of the above procedure should only be interpreted and judged by a Board-Certified Interventional Pain Specialist with extensive familiarity and expertise in the same exact procedure and technique.

## 2017-10-28 ENCOUNTER — Ambulatory Visit
Admission: RE | Admit: 2017-10-28 | Discharge: 2017-10-28 | Disposition: A | Discharge status: Home / Self Care | Payer: Medicare Other | Source: Ambulatory Visit | Attending: Pain Medicine | Admitting: Pain Medicine

## 2017-10-28 ENCOUNTER — Encounter: Payer: Self-pay | Admitting: Pain Medicine

## 2017-10-28 ENCOUNTER — Other Ambulatory Visit: Payer: Self-pay

## 2017-10-28 ENCOUNTER — Ambulatory Visit (HOSPITAL_BASED_OUTPATIENT_CLINIC_OR_DEPARTMENT_OTHER): Payer: Medicare Other | Admitting: Pain Medicine

## 2017-10-28 VITALS — BP 144/62 | HR 80 | Temp 97.3°F | Resp 14 | Ht 60.0 in | Wt 125.0 lb

## 2017-10-28 DIAGNOSIS — Z880 Allergy status to penicillin: Secondary | ICD-10-CM | POA: Insufficient documentation

## 2017-10-28 DIAGNOSIS — M47818 Spondylosis without myelopathy or radiculopathy, sacral and sacrococcygeal region: Secondary | ICD-10-CM | POA: Diagnosis not present

## 2017-10-28 DIAGNOSIS — Z888 Allergy status to other drugs, medicaments and biological substances status: Secondary | ICD-10-CM | POA: Diagnosis not present

## 2017-10-28 DIAGNOSIS — M461 Sacroiliitis, not elsewhere classified: Secondary | ICD-10-CM

## 2017-10-28 DIAGNOSIS — M533 Sacrococcygeal disorders, not elsewhere classified: Secondary | ICD-10-CM

## 2017-10-28 DIAGNOSIS — M549 Dorsalgia, unspecified: Secondary | ICD-10-CM | POA: Diagnosis present

## 2017-10-28 DIAGNOSIS — M25552 Pain in left hip: Secondary | ICD-10-CM | POA: Insufficient documentation

## 2017-10-28 DIAGNOSIS — Z7902 Long term (current) use of antithrombotics/antiplatelets: Secondary | ICD-10-CM | POA: Insufficient documentation

## 2017-10-28 DIAGNOSIS — Z7984 Long term (current) use of oral hypoglycemic drugs: Secondary | ICD-10-CM | POA: Insufficient documentation

## 2017-10-28 DIAGNOSIS — M545 Low back pain, unspecified: Secondary | ICD-10-CM

## 2017-10-28 DIAGNOSIS — Z7982 Long term (current) use of aspirin: Secondary | ICD-10-CM | POA: Diagnosis not present

## 2017-10-28 DIAGNOSIS — M160 Bilateral primary osteoarthritis of hip: Secondary | ICD-10-CM

## 2017-10-28 DIAGNOSIS — Z7951 Long term (current) use of inhaled steroids: Secondary | ICD-10-CM | POA: Insufficient documentation

## 2017-10-28 DIAGNOSIS — M25551 Pain in right hip: Secondary | ICD-10-CM

## 2017-10-28 DIAGNOSIS — F419 Anxiety disorder, unspecified: Secondary | ICD-10-CM | POA: Diagnosis not present

## 2017-10-28 DIAGNOSIS — G8929 Other chronic pain: Secondary | ICD-10-CM

## 2017-10-28 DIAGNOSIS — Z79899 Other long term (current) drug therapy: Secondary | ICD-10-CM | POA: Diagnosis not present

## 2017-10-28 DIAGNOSIS — M7061 Trochanteric bursitis, right hip: Secondary | ICD-10-CM | POA: Diagnosis not present

## 2017-10-28 DIAGNOSIS — Z885 Allergy status to narcotic agent status: Secondary | ICD-10-CM | POA: Insufficient documentation

## 2017-10-28 MED ORDER — ROPIVACAINE HCL 2 MG/ML IJ SOLN
9.0000 mL | Freq: Once | INTRAMUSCULAR | Status: AC
  Administered 2017-10-28: 10 mL via INTRA_ARTICULAR
  Filled 2017-10-28: qty 10

## 2017-10-28 MED ORDER — METHYLPREDNISOLONE ACETATE 80 MG/ML IJ SUSP
80.0000 mg | Freq: Once | INTRAMUSCULAR | Status: AC
  Administered 2017-10-28: 80 mg via INTRA_ARTICULAR
  Filled 2017-10-28: qty 1

## 2017-10-28 MED ORDER — LACTATED RINGERS IV SOLN
1000.0000 mL | Freq: Once | INTRAVENOUS | Status: AC
  Administered 2017-10-28: 1000 mL via INTRAVENOUS

## 2017-10-28 MED ORDER — IOPAMIDOL (ISOVUE-M 200) INJECTION 41%
10.0000 mL | Freq: Once | INTRAMUSCULAR | Status: AC
  Administered 2017-10-28: 10 mL via EPIDURAL
  Filled 2017-10-28: qty 10

## 2017-10-28 MED ORDER — METHYLPREDNISOLONE ACETATE 80 MG/ML IJ SUSP
80.0000 mg | Freq: Once | INTRAMUSCULAR | Status: AC
Start: 2017-10-28 — End: 2017-10-28
  Administered 2017-10-28: 80 mg via INTRA_ARTICULAR
  Filled 2017-10-28: qty 1

## 2017-10-28 MED ORDER — MIDAZOLAM HCL 5 MG/5ML IJ SOLN
1.0000 mg | INTRAMUSCULAR | Status: DC | PRN
  Administered 2017-10-28: 1 mg via INTRAVENOUS
  Filled 2017-10-28: qty 5

## 2017-10-28 MED ORDER — ROPIVACAINE HCL 2 MG/ML IJ SOLN
4.0000 mL | Freq: Once | INTRAMUSCULAR | Status: AC
  Administered 2017-10-28: 10 mL via INTRA_ARTICULAR
  Filled 2017-10-28: qty 10

## 2017-10-28 MED ORDER — LIDOCAINE HCL 2 % IJ SOLN
20.0000 mL | Freq: Once | INTRAMUSCULAR | Status: AC
  Administered 2017-10-28: 400 mg
  Filled 2017-10-28: qty 40

## 2017-10-28 MED ORDER — FENTANYL CITRATE (PF) 100 MCG/2ML IJ SOLN
25.0000 ug | INTRAMUSCULAR | Status: DC | PRN
  Administered 2017-10-28: 50 ug via INTRAVENOUS
  Filled 2017-10-28: qty 2

## 2017-10-28 NOTE — Progress Notes (Signed)
Safety precautions to be maintained throughout the outpatient stay will include: orient to surroundings, keep bed in low position, maintain call bell within reach at all times, provide assistance with transfer out of bed and ambulation.  

## 2017-10-28 NOTE — Patient Instructions (Signed)

## 2017-10-29 ENCOUNTER — Telehealth: Payer: Self-pay | Admitting: *Deleted

## 2017-10-29 NOTE — Telephone Encounter (Signed)
Denies complications post procedure. 

## 2017-11-10 ENCOUNTER — Encounter: Payer: Self-pay | Admitting: Pain Medicine

## 2017-11-10 ENCOUNTER — Other Ambulatory Visit: Payer: Self-pay

## 2017-11-10 ENCOUNTER — Ambulatory Visit: Payer: Medicare Other | Attending: Pain Medicine | Admitting: Pain Medicine

## 2017-11-10 VITALS — BP 138/60 | HR 77 | Temp 97.2°F | Resp 16 | Ht 60.0 in | Wt 121.0 lb

## 2017-11-10 DIAGNOSIS — Z888 Allergy status to other drugs, medicaments and biological substances status: Secondary | ICD-10-CM | POA: Diagnosis not present

## 2017-11-10 DIAGNOSIS — Z79899 Other long term (current) drug therapy: Secondary | ICD-10-CM | POA: Insufficient documentation

## 2017-11-10 DIAGNOSIS — G8929 Other chronic pain: Secondary | ICD-10-CM

## 2017-11-10 DIAGNOSIS — K219 Gastro-esophageal reflux disease without esophagitis: Secondary | ICD-10-CM | POA: Diagnosis not present

## 2017-11-10 DIAGNOSIS — G894 Chronic pain syndrome: Secondary | ICD-10-CM | POA: Insufficient documentation

## 2017-11-10 DIAGNOSIS — Z7901 Long term (current) use of anticoagulants: Secondary | ICD-10-CM | POA: Diagnosis not present

## 2017-11-10 DIAGNOSIS — E119 Type 2 diabetes mellitus without complications: Secondary | ICD-10-CM | POA: Diagnosis not present

## 2017-11-10 DIAGNOSIS — M7061 Trochanteric bursitis, right hip: Secondary | ICD-10-CM

## 2017-11-10 DIAGNOSIS — M7071 Other bursitis of hip, right hip: Secondary | ICD-10-CM | POA: Diagnosis not present

## 2017-11-10 DIAGNOSIS — Z7982 Long term (current) use of aspirin: Secondary | ICD-10-CM | POA: Diagnosis not present

## 2017-11-10 DIAGNOSIS — M545 Low back pain, unspecified: Secondary | ICD-10-CM

## 2017-11-10 DIAGNOSIS — Z885 Allergy status to narcotic agent status: Secondary | ICD-10-CM | POA: Diagnosis not present

## 2017-11-10 DIAGNOSIS — M533 Sacrococcygeal disorders, not elsewhere classified: Secondary | ICD-10-CM

## 2017-11-10 DIAGNOSIS — Z7902 Long term (current) use of antithrombotics/antiplatelets: Secondary | ICD-10-CM | POA: Insufficient documentation

## 2017-11-10 DIAGNOSIS — Z880 Allergy status to penicillin: Secondary | ICD-10-CM | POA: Insufficient documentation

## 2017-11-10 DIAGNOSIS — Z8673 Personal history of transient ischemic attack (TIA), and cerebral infarction without residual deficits: Secondary | ICD-10-CM | POA: Diagnosis not present

## 2017-11-10 DIAGNOSIS — E785 Hyperlipidemia, unspecified: Secondary | ICD-10-CM | POA: Insufficient documentation

## 2017-11-10 DIAGNOSIS — Z823 Family history of stroke: Secondary | ICD-10-CM | POA: Diagnosis not present

## 2017-11-10 DIAGNOSIS — M25551 Pain in right hip: Secondary | ICD-10-CM

## 2017-11-10 DIAGNOSIS — M25552 Pain in left hip: Secondary | ICD-10-CM | POA: Insufficient documentation

## 2017-11-10 DIAGNOSIS — I10 Essential (primary) hypertension: Secondary | ICD-10-CM | POA: Insufficient documentation

## 2017-11-10 DIAGNOSIS — Z7984 Long term (current) use of oral hypoglycemic drugs: Secondary | ICD-10-CM | POA: Diagnosis not present

## 2017-11-10 NOTE — Patient Instructions (Addendum)
____________________________________________________________________________________________  Preparing for your procedure (without sedation) Instructions: . Oral Intake: Do not eat or drink anything for at least 3 hours prior to your procedure. . Transportation: Unless otherwise stated by your physician, you may drive yourself after the procedure. . Blood Pressure Medicine: Take your blood pressure medicine with a sip of water the morning of the procedure. . Blood thinners:  . Diabetics on insulin: Notify the staff so that you can be scheduled 1st case in the morning. If your diabetes requires high dose insulin, take only  of your normal insulin dose the morning of the procedure and notify the staff that you have done so. . Preventing infections: Shower with an antibacterial soap the morning of your procedure.  . Build-up your immune system: Take 1000 mg of Vitamin C with every meal (3 times a day) the day prior to your procedure. [place] Kitchen Antibiotics: Inform the staff if you have a condition or reason that requires you to take antibiotics before dental procedures. . Pregnancy: If you are pregnant, call and cancel the procedure. . Sickness: If you have a cold, fever, or any active infections, call and cancel the procedure. . Arrival: You must be in the facility at least 30 minutes prior to your scheduled procedure. . Children: Do not bring any children with you. . Dress appropriately: Bring dark clothing that you would not mind if they get stained. . Valuables: Do not bring any jewelry or valuables. Procedure appointments are reserved for interventional treatments only. [place] Kitchen No Prescription Refills. . No medication changes will be discussed during procedure appointments. . No disability issues will be discussed. ____________________________________________________________________________________________   Sacroiliac (SI) Joint Injection Patient Information  Description: The sacroiliac joint  connects the scrum (very low back and tailbone) to the ilium (a pelvic bone which also forms half of the hip joint).  Normally this joint experiences very little motion.  When this joint becomes inflamed or unstable low back and or hip and pelvis pain may result.  Injection of this joint with local anesthetics (numbing medicines) and steroids can provide diagnostic information and reduce pain.  This injection is performed with the aid of x-ray guidance into the tailbone area while you are lying on your stomach.   You may experience an electrical sensation down the leg while this is being done.  You may also experience numbness.  We also may ask if we are reproducing your normal pain during the injection.  Conditions which may be treated SI injection:   Low back, buttock, hip or leg pain  Preparation for the Injection:  1. Do not eat any solid food or dairy products within 8 hours of your appointment.  2. You may drink clear liquids up to 3 hours before appointment.  Clear liquids include water, black coffee, juice or soda.  No milk or cream please. 3. You may take your regular medications, including pain medications with a sip of water before your appointment.  Diabetics should hold regular insulin (if take separately) and take 1/2 normal NPH dose the morning of the procedure.  Carry some sugar containing items with you to your appointment. 4. A driver must accompany you and be prepared to drive you home after your procedure. 5. Bring all of your current medications with you. 6. An IV may be inserted and sedation may be given at the discretion of the physician. 7. A blood pressure cuff, EKG and other monitors will often be applied during the procedure.  Some patients may need to  have extra oxygen administered for a short period.  8. You will be asked to provide medical information, including your allergies, prior to the procedure.  We must know immediately if you are taking blood thinners (like  Coumadin/Warfarin) or if you are allergic to IV iodine contrast (dye).  We must know if you could possible be pregnant.  Possible side effects:   Bleeding from needle site  Infection (rare, may require surgery)  Nerve injury (rare)  Numbness & tingling (temporary)  A brief convulsion or seizure  Light-headedness (temporary)  Pain at injection site (several days)  Decreased blood pressure (temporary)  Weakness in the leg (temporary)   Call if you experience:   New onset weakness or numbness of an extremity below the injection site that last more than 8 hours.  Hives or difficulty breathing ( go to the emergency room)  Inflammation or drainage at the injection site  Any new symptoms which are concerning to you  Please note:  Although the local anesthetic injected can often make your back/ hip/ buttock/ leg feel good for several hours after the injections, the pain will likely return.  It takes 3-7 days for steroids to work in the sacroiliac area.  You may not notice any pain relief for at least that one week.  If effective, we will often do a series of three injections spaced 3-6 weeks apart to maximally decrease your pain.  After the initial series, we generally will wait some months before a repeat injection of the same type.  If you have any questions, please call [phone] [facility]

## 2017-11-10 NOTE — Progress Notes (Signed)
Patient's Name: Destiny [name]  MRN: [id number]  Referring Provider: Derinda Late, MD  DOB: 17-Sep-1924  PCP: Derinda Late, MD  DOS: 11/10/2017  Note by: Gaspar Cola, MD  Service setting: Ambulatory outpatient  Specialty: Interventional Pain Management  Location: [facility] (AMB) Pain Management Facility    Patient type: Established   Primary Reason(s) for Visit: Encounter for post-procedure evaluation of chronic illness with mild to moderate exacerbation CC: Hip Pain (right)  HPI  Destiny [name] is a 84 y.o. year old, female patient, who comes today for a post-procedure evaluation. She has Chronic myofascial pain; Esophageal reflux; Benign essential hypertension; Migraine; Other and unspecified hyperlipidemia; Pseudophakia, both eyes; Salzmann's nodular dystrophy; Type II or unspecified type diabetes mellitus without mention of complication, not stated as uncontrolled; CVA (cerebral vascular accident) ([place]); Diabetes mellitus type 2, uncomplicated ([place]); Chronic low back pain (Primary Area of Pain) (Bilateral) (L>R); Chronic sacroiliac joint pain (Bilateral) (L>R); Chronic hip pain (Bilateral) (L>R); Chronic pain syndrome; Osteoarthritis of sacroiliac joints (Bilateral); Osteoarthritis of hip (Bilateral); Osteoarthritis of lumbar spine; Lumbar spondylosis; Long term current use of anticoagulant (Plavix); DDD (degenerative disc disease), lumbar; Lumbar facet hypertrophy; Lumbar facet syndrome (Bilateral); and Trochanteric bursitis of hip (Right) on their problem list. Her primarily concern today is the Hip Pain (right)  Pain Assessment: Location: Right Hip Duration: Chronic pain Quality: Aching, Discomfort Severity: 7 /10 (self-reported pain score)  Note: Reported level is inconsistent with clinical observations. Clinically the patient looks like a 2/10 A 2/10 is viewed as "Mild to Moderate" and described as noticeable and distracting. Impossible to hide from other people. More frequent flare-ups.  Still possible to adapt and function close to normal. It can be very annoying and may have occasional stronger flare-ups. With discipline, patients may get used to it and adapt. Destiny [name] does not seem to understand the use of our objective pain scale When using our objective Pain Scale, levels between 6 and 10/10 are said to belong in an emergency room, as it progressively worsens from a 6/10, described as severely limiting, requiring emergency care not usually available at an outpatient pain management facility. At a 6/10 level, communication becomes difficult and requires great effort. Assistance to reach the emergency department may be required. Facial flushing and profuse sweating along with potentially dangerous increases in heart rate and blood pressure will be evident. Effect on ADL: bending, walking long distances Modifying factors: rest, heat, ice  Destiny [name] comes in today for post-procedure evaluation after the treatment done on 10/28/2017.  Further details on both, my assessment(s), as well as the proposed treatment plan, please see below.  Post-Procedure Assessment  10/28/2017 Procedure: Diagnostic/therapeutic right-sided intra-articular hip joint injection + right-sided trochanteric bursa injection + right-sided sacroiliac joint block under fluoroscopic guidance and IV sedation Pre-procedure pain score:  4/10 Post-procedure pain score: 0/10 (100% relief) Influential Factors: BMI: 23.63 kg/m Intra-procedural challenges: None observed.         Assessment challenges: Unreliable results due to inadequate record keeping by Ms. Gartrell.       Reported side-effects: None.        Post-procedural adverse reactions or complications: None reported         Sedation: Sedation provided. When no sedatives are used, the analgesic levels obtained are directly associated to the effectiveness of the local anesthetics. However, when sedation is provided, the level of analgesia obtained during the initial 1  hour following the intervention, is believed to be the result of a combination of factors. These  factors may include, but are not limited to: 1. The effectiveness of the local anesthetics used. 2. The effects of the analgesic(s) and/or anxiolytic(s) used. 3. The degree of discomfort experienced by the patient at the time of the procedure. 4. The patients ability and reliability in recalling and recording the events. 5. The presence and influence of possible secondary gains and/or psychosocial factors. Reported result: Relief experienced during the 1st hour after the procedure: 90 % (Ultra-Short Term Relief) Destiny [name] has indicated area to have been numb during this time. Interpretative annotation: Clinically appropriate result. Analgesia during this period is likely to be Local Anesthetic and/or IV Sedative (Analgesic/Anxiolytic) related.     Effects of local anesthetic: The analgesic effects attained during this period are directly associated to the localized infiltration of local anesthetics and therefore cary significant diagnostic value as to the etiological location, or anatomical origin, of the pain. Expected duration of relief is directly dependent on the pharmacodynamics of the local anesthetic used. Long-acting (4-6 hours) anesthetics used.  Reported result: Relief during the next 4 to 6 hour after the procedure: 90 % (Short-Term Relief)            Interpretative annotation: Clinically appropriate result. Analgesia during this period is likely to be Local Anesthetic-related.          Long-term benefit: Defined as the period of time past the expected duration of local anesthetics (1 hour for short-acting and 4-6 hours for long-acting). With the possible exception of prolonged sympathetic blockade from the local anesthetics, benefits during this period are typically attributed to, or associated with, other factors such as analgesic sensory neuropraxia, antiinflammatory effects, or beneficial  biochemical changes provided by agents other than the local anesthetics.  Reported result: Extended relief following procedure: 80 % (Long-Term Relief)            Interpretative annotation: Clinically appropriate result. Good relief. No permanent benefit expected. Inflammation plays a part in the etiology to the pain.          Current benefits: Defined as reported results that persistent at this point in time.   Analgesia: >75 % Ms. Wainer reports that both, extremity and the axial pain improved with the treatment. Function: Ms. Rissmiller reports improvement in function ROM: Ms. Postlethwait reports improvement in ROM Interpretative annotation: Ongoing benefit. No permanent benefit expected. Effective therapeutic approach. Benefit could be steroid-related.  Interpretation: Results would suggest a successful therapeutic intervention.                  Plan:  Today the patient returns indicating that she still has a little bit of pain in the right lower back.  Physical exam shows the patient to have a positive Patrick maneuver on the right side suggesting SI joint pathology.  She also has a positive hyperextension and rotation, suggesting facet joint disease.  However, at this point since her pain is relatively under control, I will go ahead and bring her in for a diagnostic sacroiliac joint block under fluoroscopic guidance and determine if she gets complete relief of the pain or just partial.  Unfortunately, she was unable to keep her diary as requested and therefore the information that we have obtained is not as clear as we would hope.  Today we have stressed the fact that she needs to do a better job with the pain diary in the postop period.        Laboratory Chemistry  Renal Function Markers Lab Results  Component Value Date  BUN 15 07/29/2017   CREATININE 0.71 07/29/2017   GFRAA >60 07/29/2017   GFRNONAA >60 07/29/2017                 Hepatic Function Markers Lab Results  Component Value Date    AST 23 02/09/2013   ALT 13 02/09/2013   ALBUMIN 3.9 02/09/2013   ALKPHOS 87 02/09/2013                 Electrolytes Lab Results  Component Value Date   NA 142 07/29/2017   K 4.3 07/29/2017   CL 103 07/29/2017   CALCIUM 9.7 07/29/2017                 Neuropathy Markers Lab Results  Component Value Date   HGBA1C 6.7 (H) 07/30/2017                 Coagulation Parameters Lab Results  Component Value Date   INR 0.94 07/30/2017   LABPROT 12.5 07/30/2017   APTT 34 07/30/2017   PLT 260 07/29/2017                 Cardiovascular Markers Lab Results  Component Value Date   TROPONINI <0.03 07/29/2017   HGB 13.1 07/29/2017   HCT 39.7 07/29/2017                 Note: Lab results reviewed.  Recent Diagnostic Imaging Results  DG C-Arm 1-60 Min-No Report Fluoroscopy was utilized by the requesting physician.  No radiographic  interpretation.   Complexity Note: Imaging results reviewed. Results shared with Ms. Brockmann, using Layman's terms.                         Meds   Current Outpatient Medications:  .  aspirin EC 81 MG tablet, Take 1 tablet (81 mg total) by mouth daily., Disp: 100 tablet, Rfl: 0 .  clopidogrel (PLAVIX) 75 MG tablet, Take 1 tablet (75 mg total) by mouth daily., Disp: 30 tablet, Rfl: 0 .  doxepin (SINEQUAN) 10 MG capsule, TAKE ONE CAPSULE BY MOUTH EVERY NIGHT AT BEDTIME, Disp: , Rfl:  .  glucose blood (ONE TOUCH ULTRA TEST) test strip, EVERY DAY, Disp: , Rfl:  .  metFORMIN (GLUCOPHAGE) 500 MG tablet, TAKE ONE (1) TABLET BY MOUTH EVERY DAY, Disp: , Rfl:  .  ONETOUCH DELICA LANCETS FINE MISC, EVERY DAY, Disp: , Rfl:  .  telmisartan (MICARDIS) 80 MG tablet, Take 1 tablet by mouth daily., Disp: , Rfl:  .  amLODipine (NORVASC) 2.5 MG tablet, Take 1 tablet (2.5 mg total) by mouth daily. Take once daily if blood pressure > 160, Disp: 14 tablet, Rfl: 0 .  fluticasone (FLONASE) 50 MCG/ACT nasal spray, Place into the nose as needed. , Disp: , Rfl:   ROS   Constitutional: Denies any fever or chills Gastrointestinal: No reported hemesis, hematochezia, vomiting, or acute GI distress Musculoskeletal: Denies any acute onset joint swelling, redness, loss of ROM, or weakness Neurological: No reported episodes of acute onset apraxia, aphasia, dysarthria, agnosia, amnesia, paralysis, loss of coordination, or loss of consciousness  Allergies  Ms. Gallant is allergic to penicillins; ampicillin; hydrocodone-homatropine; prednisone; amoxicillin; and tramadol.  PFSH  Drug: Ms. Gorman  reports that she does not use drugs. Alcohol:  reports that she does not drink alcohol. Tobacco:  reports that  has never smoked. she has never used smokeless tobacco. Medical:  has a past medical history of Allergy, Diabetes mellitus  without complication ([place]), Hyperlipidemia, Shingles, and Stroke ([place]). Surgical: Ms. Paone  has a past surgical history that includes Joint replacement; Knee surgery (Bilateral); Back surgery; Appendectomy; Tonsilectomy, adenoidectomy, bilateral myringotomy and tubes; and Foot surgery (Bilateral). Family: family history includes Stroke in her father and mother.  Constitutional Exam  General appearance: Well nourished, well developed, and well hydrated. In no apparent acute distress Vitals:   11/10/17 1417  BP: 138/60  Pulse: 77  Resp: 16  Temp: (!) 97.2 F (36.2 C)  SpO2: 99%  Weight: 121 lb (54.9 kg)  Height: 5' (1.524 m)   BMI Assessment: Estimated body mass index is 23.63 kg/m as calculated from the following:   Height as of this encounter: 5' (1.524 m).   Weight as of this encounter: 121 lb (54.9 kg).  BMI interpretation table: BMI level Category Range association with higher incidence of chronic pain  <18 kg/m2 Underweight   18.5-24.9 kg/m2 Ideal body weight   25-29.9 kg/m2 Overweight Increased incidence by 20%  30-34.9 kg/m2 Obese (Class I) Increased incidence by 68%  35-39.9 kg/m2 Severe obesity (Class II) Increased  incidence by 136%  >40 kg/m2 Extreme obesity (Class III) Increased incidence by 254%   BMI Readings from Last 4 Encounters:  11/10/17 23.63 kg/m  10/28/17 24.41 kg/m  10/20/17 24.41 kg/m  09/18/17 24.41 kg/m   Wt Readings from Last 4 Encounters:  11/10/17 121 lb (54.9 kg)  10/28/17 125 lb (56.7 kg)  10/20/17 125 lb (56.7 kg)  09/18/17 125 lb (56.7 kg)  Psych/Mental status: Alert, oriented x 3 (person, place, & time)       Eyes: PERLA Respiratory: No evidence of acute respiratory distress  Cervical Spine Area Exam  Skin & Axial Inspection: No masses, redness, edema, swelling, or associated skin lesions Alignment: Symmetrical Functional ROM: Unrestricted ROM      Stability: No instability detected Muscle Tone/Strength: Functionally intact. No obvious neuro-muscular anomalies detected. Sensory (Neurological): Unimpaired Palpation: No palpable anomalies              Upper Extremity (UE) Exam    Side: Right upper extremity  Side: Left upper extremity  Skin & Extremity Inspection: Skin color, temperature, and hair growth are WNL. No peripheral edema or cyanosis. No masses, redness, swelling, asymmetry, or associated skin lesions. No contractures.  Skin & Extremity Inspection: Skin color, temperature, and hair growth are WNL. No peripheral edema or cyanosis. No masses, redness, swelling, asymmetry, or associated skin lesions. No contractures.  Functional ROM: Unrestricted ROM          Functional ROM: Unrestricted ROM          Muscle Tone/Strength: Functionally intact. No obvious neuro-muscular anomalies detected.  Muscle Tone/Strength: Functionally intact. No obvious neuro-muscular anomalies detected.  Sensory (Neurological): Unimpaired          Sensory (Neurological): Unimpaired          Palpation: No palpable anomalies              Palpation: No palpable anomalies              Specialized Test(s): Deferred         Specialized Test(s): Deferred          Thoracic Spine Area Exam   Skin & Axial Inspection: No masses, redness, or swelling Alignment: Symmetrical Functional ROM: Unrestricted ROM Stability: No instability detected Muscle Tone/Strength: Functionally intact. No obvious neuro-muscular anomalies detected. Sensory (Neurological): Unimpaired Muscle strength & Tone: No palpable anomalies  Lumbar Spine Area  Exam  Skin & Axial Inspection: No masses, redness, or swelling Alignment: Symmetrical Functional ROM: Decreased ROM      Stability: No instability detected Muscle Tone/Strength: Functionally intact. No obvious neuro-muscular anomalies detected. Sensory (Neurological): Movement-associated pain Palpation: Complains of area being tender to palpation       Provocative Tests: Lumbar Hyperextension and rotation test: Positive bilaterally for facet joint pain. (R>L) Lumbar Lateral bending test: evaluation deferred today       Patrick's Maneuver: Positive for bilateral S-I arthralgia (R>L)              Gait & Posture Assessment  Ambulation: Unassisted Gait: Relatively normal for age and body habitus Posture: WNL   Lower Extremity Exam    Side: Right lower extremity  Side: Left lower extremity  Skin & Extremity Inspection: Skin color, temperature, and hair growth are WNL. No peripheral edema or cyanosis. No masses, redness, swelling, asymmetry, or associated skin lesions. No contractures.  Skin & Extremity Inspection: Skin color, temperature, and hair growth are WNL. No peripheral edema or cyanosis. No masses, redness, swelling, asymmetry, or associated skin lesions. No contractures.  Functional ROM: Unrestricted ROM          Functional ROM: Unrestricted ROM          Muscle Tone/Strength: Functionally intact. No obvious neuro-muscular anomalies detected.  Muscle Tone/Strength: Functionally intact. No obvious neuro-muscular anomalies detected.  Sensory (Neurological): Unimpaired  Sensory (Neurological): Unimpaired  Palpation: No palpable anomalies  Palpation: No  palpable anomalies   Assessment  Primary Diagnosis & Pertinent Problem List: The primary encounter diagnosis was Chronic low back pain (Primary Area of Pain) (Bilateral) (L>R). Diagnoses of Chronic sacroiliac joint pain (Bilateral) (L>R), Chronic hip pain (Bilateral) (L>R), Trochanteric bursitis of hip (Right), Chronic pain syndrome, and Long term current use of anticoagulant (Plavix) were also pertinent to this visit.  Status Diagnosis  Improved Improving Controlled 1. Chronic low back pain (Primary Area of Pain) (Bilateral) (L>R)   2. Chronic sacroiliac joint pain (Bilateral) (L>R)   3. Chronic hip pain (Bilateral) (L>R)   4. Trochanteric bursitis of hip (Right)   5. Chronic pain syndrome   6. Long term current use of anticoagulant (Plavix)     Problems updated and reviewed during this visit: Problem  Chronic hip pain (Bilateral) (L>R)   Plan of Care  Pharmacotherapy (Medications Ordered): No orders of the defined types were placed in this encounter.  Medications administered today: Tylynn T. Hochstein had no medications administered during this visit.   Procedure Orders     SACROILIAC JOINT INJECTION Lab Orders  No laboratory test(s) ordered today   Imaging Orders  No imaging studies ordered today   Referral Orders  No referral(s) requested today    Interventional management options: Planned, scheduled, and/or pending:   Diagnostic right-sided sacroiliac joint block under fluoroscopic guidance, no sedation.   Considering:   Diagnostic bilateral lumbar facet block  Possible bilateral lumbar facet RFA  Diagnostic bilateral sacroiliac joint block  Possible bilateral sacroiliac joint RFA  Diagnostic bilateral intra-articular hip joint injection  Diagnostic bilateral femoral nerve block + obturator nerve block  Possible bilateral femoral nerve + obturator nerve RFA  Diagnostic right-sided trochanteric bursa injection    Palliative PRN treatment(s):   None at this time    Provider-requested follow-up: Return for Procedure (no sedation): (R) SI Blk.  Future Appointments  Date Time Provider [place]  11/18/2017  7:45 AM Milinda Pointer, [place] None   Primary Care Physician: Derinda Late,  MD Location: [facility] Note by: Gaspar Cola, MD Date: 11/10/2017; Time: 3:52 PM

## 2017-11-17 NOTE — Progress Notes (Signed)
Patient's Name: Destiny [name]  MRN: [id number]  Referring Provider: Derinda Late, MD  DOB: [birth year]  PCP: Derinda Late, MD  DOS: 11/18/2017  Note by: Gaspar Cola, MD  Service setting: Ambulatory outpatient  Specialty: Interventional Pain Management  Patient type: Established  Location: [facility] (AMB) Pain Management Facility  Visit type: Interventional Procedure   Primary Reason for Visit: Interventional Pain Management Treatment. CC: Back Pain (right, lower)  Procedure #1:  Anesthesia, Analgesia, Anxiolysis:  Type: Diagnostic Sacroiliac Joint Steroid Injection          Region: Superior Lumbosacral Region Level: PSIS (Posterior Superior Iliac Spine) Laterality: Right-Sided  Type: Local Anesthesia Local Anesthetic: Lidocaine 1% Route: Infiltration ([place]/IM) IV Access: Declined Sedation: Declined  Indication(s): Analgesia           Indications: 1. Chronic sacroiliac joint pain (Bilateral) (L>R)   2. Osteoarthritis of sacroiliac joints (Bilateral)   3. Groin pain (Right) - postprocedure   Procedure #2:  Anesthesia, Analgesia, Anxiolysis:  Type: Trigger Point Injection Level: Right buttocks area below the iliac crest Laterality: Right side Position: Prone Target Muscle: Gluteus medius Approach: Posterolateral percutaneous Region: Posterolateral  Buttocks area. Primary Purpose: Diagnostic/therapeutic  Type: Local Anesthesia Local Anesthetic: Lidocaine 1% Route: Infiltration ([place]/IM) IV Access: Declined Sedation: Declined  Indication(s): Analgesia           Indications: 1. Trigger point with back pain (buttocks area) (Right)    Pain Score: Pre-procedure: 7 /10 Post-procedure: 8 /10 (The patient did experience some right groin pain after the SI joint injection, suggesting rupture of the anterior capsule of the joint.)  Pre-op Assessment:  Destiny [name] is a 84 y.o. (year old), female patient, seen today for interventional treatment. She  has a past surgical history that  includes Joint replacement; Knee surgery (Bilateral); Back surgery; Appendectomy; Tonsilectomy, adenoidectomy, bilateral myringotomy and tubes; and Foot surgery (Bilateral). Destiny [name] has a current medication list which includes the following prescription(s): aspirin ec, clopidogrel, doxepin, glucose blood, metformin, onetouch delica lancets fine, telmisartan, amlodipine, and fluticasone, and the following Facility-Administered Medications: ketorolac, orphenadrine, and ropivacaine (pf) 2 mg/ml (0.2%). Her primarily concern today is the Back Pain (right, lower)  Initial Vital Signs:  Pulse Rate: 85 Temp: 98 F (36.7 C) Resp: 16 BP: (!) 149/71 SpO2: 100 %  BMI: Estimated body mass index is 24.02 kg/m as calculated from the following:   Height as of this encounter: 5' (1.524 m).   Weight as of this encounter: 123 lb (55.8 kg).  Risk Assessment: Allergies: Reviewed. She is allergic to penicillins; ampicillin; hydrocodone-homatropine; amoxicillin; and tramadol.  Allergy Precautions: None required Coagulopathies: Reviewed. None identified.  Blood-thinner therapy: None at this time Active Infection(s): Reviewed. None identified. Destiny [name] is afebrile  Site Confirmation: Destiny [name] was asked to confirm the procedure and laterality before marking the site Procedure checklist: Completed Consent: Before the procedure and under the influence of no sedative(s), amnesic(s), or anxiolytics, the patient was informed of the treatment options, risks and possible complications. To fulfill our ethical and legal obligations, as recommended by the American Medical Association's Code of Ethics, I have informed the patient of my clinical impression; the nature and purpose of the treatment or procedure; the risks, benefits, and possible complications of the intervention; the alternatives, including doing nothing; the risk(s) and benefit(s) of the alternative treatment(s) or procedure(s); and the risk(s) and benefit(s)  of doing nothing. The patient was provided information about the general risks and possible complications associated with the procedure. These may include,  but are not limited to: failure to achieve desired goals, infection, bleeding, organ or nerve damage, allergic reactions, paralysis, and death. In addition, the patient was informed of those risks and complications associated to the procedure, such as failure to decrease pain; infection; bleeding; organ or nerve damage with subsequent damage to sensory, motor, and/or autonomic systems, resulting in permanent pain, numbness, and/or weakness of one or several areas of the body; allergic reactions; (i.e.: anaphylactic reaction); and/or death. Furthermore, the patient was informed of those risks and complications associated with the medications. These include, but are not limited to: allergic reactions (i.e.: anaphylactic or anaphylactoid reaction(s)); adrenal axis suppression; blood sugar elevation that in diabetics may result in ketoacidosis or comma; water retention that in patients with history of congestive heart failure may result in shortness of breath, pulmonary edema, and decompensation with resultant heart failure; weight gain; swelling or edema; medication-induced neural toxicity; particulate matter embolism and blood vessel occlusion with resultant organ, and/or nervous system infarction; and/or aseptic necrosis of one or more joints. Finally, the patient was informed that Medicine is not an exact science; therefore, there is also the possibility of unforeseen or unpredictable risks and/or possible complications that may result in a catastrophic outcome. The patient indicated having understood very clearly. We have given the patient no guarantees and we have made no promises. Enough time was given to the patient to ask questions, all of which were answered to the patient's satisfaction. Destiny [name] has indicated that she wanted to continue with the  procedure. Attestation: I, the ordering provider, attest that I have discussed with the patient the benefits, risks, side-effects, alternatives, likelihood of achieving goals, and potential problems during recovery for the procedure that I have provided informed consent. Date: 11/18/2017  Time: 8:09 AM  Pre-Procedure Preparation:  Monitoring: As per clinic protocol. Respiration, ETCO2, SpO2, BP, heart rate and rhythm monitor placed and checked for adequate function Safety Precautions: Patient was assessed for positional comfort and pressure points before starting the procedure. Time-out: I initiated and conducted the "Time-out" before starting the procedure, as per protocol. The patient was asked to participate by confirming the accuracy of the "Time Out" information. Verification of the correct person, site, and procedure were performed and confirmed by me, the nursing staff, and the patient. "Time-out" conducted as per Joint Commission's Universal Protocol (UP.01.01.01). "Time-out" Date & Time: 11/18/2017; 0854 hrs.  Description of Procedure #1 Process:  Position: Prone Target Area: Superior, posterior, aspect of the sacroiliac fissure Approach: Posterior, paraspinal, ipsilateral approach. Area Prepped: Entire Lower Lumbosacral Region Prepping solution: ChloraPrep (2% chlorhexidine gluconate and 70% isopropyl alcohol) Safety Precautions: Aspiration looking for blood return was conducted prior to all injections. At no point did we inject any substances, as a needle was being advanced. No attempts were made at seeking any paresthesias. Safe injection practices and needle disposal techniques used. Medications properly checked for expiration dates. SDV (single dose vial) medications used. Description of the Procedure: Protocol guidelines were followed. The patient was placed in position over the procedure table. The target area was identified and the area prepped in the usual manner. Skin & deeper  tissues infiltrated with local anesthetic. Appropriate amount of time allowed to pass for local anesthetics to take effect. The procedure needle was advanced under fluoroscopic guidance into the sacroiliac joint until a firm endpoint was obtained. Proper needle placement secured. Negative aspiration confirmed. Solution injected in intermittent fashion, asking for systemic symptoms every 0.5cc of injectate. The needles were then removed and the  area cleansed, making sure to leave some of the prepping solution back to take advantage of its long term bactericidal properties.  Vitals:   11/18/17 0804 11/18/17 0853 11/18/17 0858 11/18/17 0900  BP: (!) 149/71 (!) 150/65 (!) 158/72 (!) 158/72  Pulse: 85     Resp: 16 17 18 11   Temp: 98 F (36.7 C)     TempSrc: Oral     SpO2: 100% 100% 100% 100%  Weight: 123 lb (55.8 kg)     Height: 5' (1.524 m)       Start Time: 0854 hrs. End Time: 0900 hrs. Materials:  Needle(s) Type: Regular needle Gauge: 22G Length: 3.5-in Medication(s): Please see chart orders for dosing details.  Imaging Guidance for procedure #1 & 2 (Non-Spinal):  Type of Imaging Technique: Fluoroscopy Guidance (Non-Spinal) Indication(s): Assistance in needle guidance and placement for procedures requiring needle placement in or near specific anatomical locations not easily accessible without such assistance. Exposure Time: Please see nurses notes. Contrast: None used. Fluoroscopic Guidance: I was personally present during the use of fluoroscopy. "Tunnel Vision Technique" used to obtain the best possible view of the target area. Parallax error corrected before commencing the procedure. "Direction-depth-direction" technique used to introduce the needle under continuous pulsed fluoroscopy. Once target was reached, antero-posterior, oblique, and lateral fluoroscopic projection used confirm needle placement in all planes. Images permanently stored in EMR. Interpretation: No contrast  injected.  Description of Procedure #2 Process:   Prepping solution: ChloraPrep (2% chlorhexidine gluconate and 70% isopropyl alcohol) Safety Precautions: Aspiration looking for blood return was conducted prior to all injections. At no point did we inject any substances, as a needle was being advanced. No attempts were made at seeking any paresthesias. Safe injection practices and needle disposal techniques used. Medications properly checked for expiration dates. SDV (single dose vial) medications used. Description of the Procedure: Protocol guidelines were followed. The patient was placed in position over the fluoroscopy table. The target area was identified and the area prepped in the usual manner. Skin desensitized using vapocoolant spray. Skin & deeper tissues infiltrated with local anesthetic. Appropriate amount of time allowed to pass for local anesthetics to take effect. The procedure needles were then advanced to the target area. Proper needle placement secured. Negative aspiration confirmed. Solution injected in intermittent fashion, asking for systemic symptoms every 0.5cc of injectate. The needles were then removed and the area cleansed, making sure to leave some of the prepping solution back to take advantage of its long term bactericidal properties. Vitals:   11/18/17 0804 11/18/17 0853 11/18/17 0858 11/18/17 0900  BP: (!) 149/71 (!) 150/65 (!) 158/72 (!) 158/72  Pulse: 85     Resp: 16 17 18 11   Temp: 98 F (36.7 C)     TempSrc: Oral     SpO2: 100% 100% 100% 100%  Weight: 123 lb (55.8 kg)     Height: 5' (1.524 m)       Start Time: 0854 hrs. End Time: 0900 hrs. Materials:  Needle(s) Type: Epidural needle Gauge: 25G Length: 1.5-in Medication(s): We administered lidocaine, ropivacaine (PF) 2 mg/mL (0.2%), methylPREDNISolone acetate, and triamcinolone acetonide. Please see chart orders for dosing details.  Antibiotic Prophylaxis:  Indication(s): None identified Antibiotic given:  None  Post-operative Assessment:  Post-procedure Vital Signs:  Pulse Rate: 85 Temp: 98 F (36.7 C) Resp: 11 BP: (!) 158/72 SpO2: 100 %  EBL: None  Complications: Transient increase in pain over the right groin area, possibly secondary to rupture of the anterior capsule  of the SI joint.  The patient indicates that she had experienced this the last time that she had the other procedure done confirming that this is the case.  Note: The patient tolerated the entire procedure well. A repeat set of vitals were taken after the procedure and the patient was kept under observation following institutional policy, for this type of procedure. Post-procedural neurological assessment was performed, showing return to baseline, prior to discharge. The patient was provided with post-procedure discharge instructions, including a section on how to identify potential problems. Should any problems arise concerning this procedure, the patient was given instructions to immediately contact us, at any time, without hesitation. In any case, we plan to contact the patient by telephone for a follow-up status report regarding this interventional procedure.  Comments:  Toradol/Norflex IM 60/60 mg given for right groin discomfort.  Plan of Care   Imaging Orders     DG C-Arm 1-60 Min-No Report  Procedure Orders     SACROILIAC JOINT INJECTION     TRIGGER POINT INJECTION  Medications ordered for procedure: Meds ordered this encounter  Medications  . lidocaine (XYLOCAINE) 2 % (with pres) injection 400 mg  . ropivacaine (PF) 2 mg/mL (0.2%) (NAROPIN) injection 4 mL  . methylPREDNISolone acetate (DEPO-MEDROL) injection 80 mg  . ropivacaine (PF) 2 mg/mL (0.2%) (NAROPIN) injection 4 mL  . triamcinolone acetonide (KENALOG-40) injection 40 mg  . orphenadrine (NORFLEX) injection 60 mg  . ketorolac (TORADOL) injection 60 mg   Medications administered: We administered lidocaine, ropivacaine (PF) 2 mg/mL (0.2%),  methylPREDNISolone acetate, and triamcinolone acetonide.  See the medical record for exact dosing, route, and time of administration.  New Prescriptions   No medications on file   Disposition: Discharge home  Discharge Date & Time: 11/18/2017; 0906 hrs.   Physician-requested Follow-up: Return for post-procedure eval (2 wks), w/ Dr. Dossie Arbour.  Future Appointments  Date Time Provider [place]  12/01/2017  1:30 PM Milinda Pointer, MD [facility] None   Primary Care Physician: Derinda Late, MD Location: [facility] Note by: Gaspar Cola, MD Date: 11/18/2017; Time: 9:37 AM  Disclaimer:  Medicine is not an exact science. The only guarantee in medicine is that nothing is guaranteed. It is important to note that the decision to proceed with this intervention was based on the information collected from the patient. The Data and conclusions were drawn from the patient's questionnaire, the interview, and the physical examination. Because the information was provided in large part by the patient, it cannot be guaranteed that it has not been purposely or unconsciously manipulated. Every effort has been made to obtain as much relevant data as possible for this evaluation. It is important to note that the conclusions that lead to this procedure are derived in large part from the available data. Always take into account that the treatment will also be dependent on availability of resources and existing treatment guidelines, considered by other Pain Management Practitioners as being common knowledge and practice, at the time of the intervention. For Medico-Legal purposes, it is also important to point out that variation in procedural techniques and pharmacological choices are the acceptable norm. The indications, contraindications, technique, and results of the above procedure should only be interpreted and judged by a Board-Certified Interventional Pain Specialist with  extensive familiarity and expertise in the same exact procedure and technique.

## 2017-11-18 ENCOUNTER — Ambulatory Visit
Admission: RE | Admit: 2017-11-18 | Discharge: 2017-11-18 | Disposition: A | Discharge status: Home / Self Care | Payer: Medicare Other | Source: Ambulatory Visit | Attending: Pain Medicine | Admitting: Pain Medicine

## 2017-11-18 ENCOUNTER — Other Ambulatory Visit: Payer: Self-pay

## 2017-11-18 ENCOUNTER — Ambulatory Visit (HOSPITAL_BASED_OUTPATIENT_CLINIC_OR_DEPARTMENT_OTHER): Payer: Medicare Other | Admitting: Pain Medicine

## 2017-11-18 VITALS — BP 158/72 | HR 85 | Temp 98.0°F | Resp 11 | Ht 60.0 in | Wt 123.0 lb

## 2017-11-18 DIAGNOSIS — Z9889 Other specified postprocedural states: Secondary | ICD-10-CM | POA: Insufficient documentation

## 2017-11-18 DIAGNOSIS — Z885 Allergy status to narcotic agent status: Secondary | ICD-10-CM | POA: Diagnosis not present

## 2017-11-18 DIAGNOSIS — Z880 Allergy status to penicillin: Secondary | ICD-10-CM | POA: Insufficient documentation

## 2017-11-18 DIAGNOSIS — M47818 Spondylosis without myelopathy or radiculopathy, sacral and sacrococcygeal region: Secondary | ICD-10-CM | POA: Diagnosis not present

## 2017-11-18 DIAGNOSIS — Z79899 Other long term (current) drug therapy: Secondary | ICD-10-CM | POA: Insufficient documentation

## 2017-11-18 DIAGNOSIS — G8929 Other chronic pain: Secondary | ICD-10-CM | POA: Diagnosis not present

## 2017-11-18 DIAGNOSIS — M533 Sacrococcygeal disorders, not elsewhere classified: Secondary | ICD-10-CM

## 2017-11-18 DIAGNOSIS — Z888 Allergy status to other drugs, medicaments and biological substances status: Secondary | ICD-10-CM | POA: Diagnosis not present

## 2017-11-18 DIAGNOSIS — M549 Dorsalgia, unspecified: Secondary | ICD-10-CM | POA: Diagnosis present

## 2017-11-18 DIAGNOSIS — Z7902 Long term (current) use of antithrombotics/antiplatelets: Secondary | ICD-10-CM | POA: Insufficient documentation

## 2017-11-18 DIAGNOSIS — M461 Sacroiliitis, not elsewhere classified: Secondary | ICD-10-CM

## 2017-11-18 DIAGNOSIS — R1031 Right lower quadrant pain: Secondary | ICD-10-CM | POA: Insufficient documentation

## 2017-11-18 MED ORDER — TRIAMCINOLONE ACETONIDE 40 MG/ML IJ SUSP
40.0000 mg | Freq: Once | INTRAMUSCULAR | Status: AC
  Administered 2017-11-18: 40 mg

## 2017-11-18 MED ORDER — ROPIVACAINE HCL 2 MG/ML IJ SOLN
4.0000 mL | Freq: Once | INTRAMUSCULAR | Status: AC
  Administered 2017-11-18: 10 mL via INTRA_ARTICULAR
  Filled 2017-11-18: qty 10

## 2017-11-18 MED ORDER — TRIAMCINOLONE ACETONIDE 40 MG/ML IJ SUSP
INTRAMUSCULAR | Status: AC
  Filled 2017-11-18: qty 1

## 2017-11-18 MED ORDER — ROPIVACAINE HCL 2 MG/ML IJ SOLN: 4.0000 mL | Freq: Once | INTRAMUSCULAR | Status: DC

## 2017-11-18 MED ORDER — ROPIVACAINE HCL 2 MG/ML IJ SOLN
INTRAMUSCULAR | Status: AC
  Filled 2017-11-18: qty 10

## 2017-11-18 MED ORDER — ORPHENADRINE CITRATE 30 MG/ML IJ SOLN
INTRAMUSCULAR | Status: AC
  Filled 2017-11-18: qty 2

## 2017-11-18 MED ORDER — METHYLPREDNISOLONE ACETATE 80 MG/ML IJ SUSP
80.0000 mg | Freq: Once | INTRAMUSCULAR | Status: AC
  Administered 2017-11-18: 80 mg via INTRA_ARTICULAR
  Filled 2017-11-18: qty 1

## 2017-11-18 MED ORDER — ORPHENADRINE CITRATE 30 MG/ML IJ SOLN
60.0000 mg | Freq: Once | INTRAMUSCULAR | Status: DC
  Administered 2017-11-18: 60 mg via INTRAMUSCULAR

## 2017-11-18 MED ORDER — KETOROLAC TROMETHAMINE 60 MG/2ML IM SOLN
60.0000 mg | Freq: Once | INTRAMUSCULAR | Status: DC
  Administered 2017-11-18: 60 mg via INTRAMUSCULAR

## 2017-11-18 MED ORDER — LIDOCAINE HCL 2 % IJ SOLN
20.0000 mL | Freq: Once | INTRAMUSCULAR | Status: AC
  Administered 2017-11-18: 400 mg
  Filled 2017-11-18: qty 20

## 2017-11-18 MED ORDER — KETOROLAC TROMETHAMINE 60 MG/2ML IM SOLN
INTRAMUSCULAR | Status: AC
  Filled 2017-11-18: qty 2

## 2017-11-18 NOTE — Progress Notes (Signed)
Safety precautions to be maintained throughout the outpatient stay will include: orient to surroundings, keep bed in low position, maintain call bell within reach at all times, provide assistance with transfer out of bed and ambulation.  

## 2017-11-18 NOTE — Patient Instructions (Addendum)
____________________________________________________________________________________________  Post-Procedure instructions Instructions:  Apply ice: Fill a plastic sandwich bag with crushed ice. Cover it with a small towel and apply to injection site. Apply for 15 minutes then remove x 15 minutes. Repeat sequence on day of procedure, until you go to bed. The purpose is to minimize swelling and discomfort after procedure.  Apply heat: Apply heat to procedure site starting the day following the procedure. The purpose is to treat any soreness and discomfort from the procedure.  Food intake: Start with clear liquids (like water) and advance to regular food, as tolerated.   Physical activities: Keep activities to a minimum for the first 8 hours after the procedure.   Driving: If you have received any sedation, you are not allowed to drive for 24 hours after your procedure.  Blood thinner: Restart your blood thinner 6 hours after your procedure. (Only for those taking blood thinners)  Insulin: As soon as you can eat, you may resume your normal dosing schedule. (Only for those taking insulin)  Infection prevention: Keep procedure site clean and dry.  Post-procedure Pain Diary: Extremely important that this be done correctly and accurately. Recorded information will be used to determine the next step in treatment.  Pain evaluated is that of treated area only. Do not include pain from an untreated area.  Complete every hour, on the hour, for the initial 8 hours. Set an alarm to help you do this part accurately.  Do not go to sleep and have it completed later. It will not be accurate.  Follow-up appointment: Keep your follow-up appointment after the procedure. Usually 2 weeks for most procedures. (6 weeks in the case of radiofrequency.) Bring you pain diary.  Expect:  From numbing medicine (AKA: Local Anesthetics): Numbness or decrease in pain.  Onset: Full effect within 15 minutes of  injected.  Duration: It will depend on the type of local anesthetic used. On the average, 1 to 8 hours.   From steroids: Decrease in swelling or inflammation. Once inflammation is improved, relief of the pain will follow.  Onset of benefits: Depends on the amount of swelling present. The more swelling, the longer it will take for the benefits to be seen. In some cases, up to 10 days.  Duration: Steroids will stay in the system x 2 weeks. Duration of benefits will depend on multiple posibilities including persistent irritating factors.  From procedure: Some discomfort is to be expected once the numbing medicine wears off. This should be minimal if ice and heat are applied as instructed. Call if:  You experience numbness and weakness that gets worse with time, as opposed to wearing off.  New onset bowel or bladder incontinence. (Spinal procedures only)  Emergency Numbers:  [place] hours (Monday - Thursday, 8:00 AM - 4:00 PM) (Friday, 9:00 AM - 12:00 Noon): [phone]  After hours: [phone] ____________________________________________________________________________________________   Sacroiliac (SI) Joint Injection Patient Information  Description: The sacroiliac joint connects the scrum (very low back and tailbone) to the ilium (a pelvic bone which also forms half of the hip joint).  Normally this joint experiences very little motion.  When this joint becomes inflamed or unstable low back and or hip and pelvis pain may result.  Injection of this joint with local anesthetics (numbing medicines) and steroids can provide diagnostic information and reduce pain.  This injection is performed with the aid of x-ray guidance into the tailbone area while you are lying on your stomach.   You may experience an  electrical sensation down the leg while this is being done.  You may also experience numbness.  We also may ask if we are reproducing your normal pain during the  injection.  Conditions which may be treated SI injection:   Low back, buttock, hip or leg pain  Preparation for the Injection:  1. Do not eat any solid food or dairy products within 8 hours of your appointment.  2. You may drink clear liquids up to 3 hours before appointment.  Clear liquids include water, black coffee, juice or soda.  No milk or cream please. 3. You may take your regular medications, including pain medications with a sip of water before your appointment.  Diabetics should hold regular insulin (if take separately) and take 1/2 normal NPH dose the morning of the procedure.  Carry some sugar containing items with you to your appointment. 4. A driver must accompany you and be prepared to drive you home after your procedure. 5. Bring all of your current medications with you. 6. An IV may be inserted and sedation may be given at the discretion of the physician. 7. A blood pressure cuff, EKG and other monitors will often be applied during the procedure.  Some patients may need to have extra oxygen administered for a short period.  8. You will be asked to provide medical information, including your allergies, prior to the procedure.  We must know immediately if you are taking blood thinners (like Coumadin/Warfarin) or if you are allergic to IV iodine contrast (dye).  We must know if you could possible be pregnant.  Possible side effects:   Bleeding from needle site  Infection (rare, may require surgery)  Nerve injury (rare)  Numbness & tingling (temporary)  A brief convulsion or seizure  Light-headedness (temporary)  Pain at injection site (several days)  Decreased blood pressure (temporary)  Weakness in the leg (temporary)   Call if you experience:   New onset weakness or numbness of an extremity below the injection site that last more than 8 hours.  Hives or difficulty breathing ( go to the emergency room)  Inflammation or drainage at the injection  site  Any new symptoms which are concerning to you  Please note:  Although the local anesthetic injected can often make your back/ hip/ buttock/ leg feel good for several hours after the injections, the pain will likely return.  It takes 3-7 days for steroids to work in the sacroiliac area.  You may not notice any pain relief for at least that one week.  If effective, we will often do a series of three injections spaced 3-6 weeks apart to maximally decrease your pain.  After the initial series, we generally will wait some months before a repeat injection of the same type.  If you have any questions, please call [phone] [facility]

## 2017-11-18 NOTE — Addendum Note (Signed)
Addended by: Landis Martins on: 11/18/2017 10:14 AM   Modules accepted: Orders

## 2017-11-19 ENCOUNTER — Telehealth: Payer: Self-pay

## 2017-11-19 NOTE — Telephone Encounter (Signed)
Denies any needs at this time. States she woke up this morning without any pain. Instructed to call if needed.

## 2017-12-01 ENCOUNTER — Ambulatory Visit: Payer: Medicare Other | Attending: Pain Medicine | Admitting: Pain Medicine

## 2017-12-01 ENCOUNTER — Encounter: Payer: Self-pay | Admitting: Pain Medicine

## 2017-12-01 VITALS — BP 154/60 | HR 79 | Temp 97.8°F | Resp 16 | Ht 60.0 in | Wt 122.0 lb

## 2017-12-01 DIAGNOSIS — Z885 Allergy status to narcotic agent status: Secondary | ICD-10-CM | POA: Diagnosis not present

## 2017-12-01 DIAGNOSIS — K219 Gastro-esophageal reflux disease without esophagitis: Secondary | ICD-10-CM | POA: Insufficient documentation

## 2017-12-01 DIAGNOSIS — M47816 Spondylosis without myelopathy or radiculopathy, lumbar region: Secondary | ICD-10-CM | POA: Insufficient documentation

## 2017-12-01 DIAGNOSIS — Z888 Allergy status to other drugs, medicaments and biological substances status: Secondary | ICD-10-CM | POA: Diagnosis not present

## 2017-12-01 DIAGNOSIS — Z961 Presence of intraocular lens: Secondary | ICD-10-CM | POA: Insufficient documentation

## 2017-12-01 DIAGNOSIS — Z7901 Long term (current) use of anticoagulants: Secondary | ICD-10-CM | POA: Insufficient documentation

## 2017-12-01 DIAGNOSIS — Z8673 Personal history of transient ischemic attack (TIA), and cerebral infarction without residual deficits: Secondary | ICD-10-CM | POA: Diagnosis not present

## 2017-12-01 DIAGNOSIS — M533 Sacrococcygeal disorders, not elsewhere classified: Secondary | ICD-10-CM | POA: Diagnosis not present

## 2017-12-01 DIAGNOSIS — M545 Low back pain, unspecified: Secondary | ICD-10-CM

## 2017-12-01 DIAGNOSIS — I10 Essential (primary) hypertension: Secondary | ICD-10-CM | POA: Diagnosis not present

## 2017-12-01 DIAGNOSIS — Z880 Allergy status to penicillin: Secondary | ICD-10-CM | POA: Insufficient documentation

## 2017-12-01 DIAGNOSIS — E785 Hyperlipidemia, unspecified: Secondary | ICD-10-CM | POA: Diagnosis not present

## 2017-12-01 DIAGNOSIS — Z79899 Other long term (current) drug therapy: Secondary | ICD-10-CM | POA: Insufficient documentation

## 2017-12-01 DIAGNOSIS — Z7984 Long term (current) use of oral hypoglycemic drugs: Secondary | ICD-10-CM | POA: Insufficient documentation

## 2017-12-01 DIAGNOSIS — E119 Type 2 diabetes mellitus without complications: Secondary | ICD-10-CM | POA: Diagnosis not present

## 2017-12-01 DIAGNOSIS — M7918 Myalgia, other site: Secondary | ICD-10-CM | POA: Insufficient documentation

## 2017-12-01 DIAGNOSIS — Z7902 Long term (current) use of antithrombotics/antiplatelets: Secondary | ICD-10-CM | POA: Diagnosis not present

## 2017-12-01 DIAGNOSIS — M5136 Other intervertebral disc degeneration, lumbar region: Secondary | ICD-10-CM | POA: Insufficient documentation

## 2017-12-01 DIAGNOSIS — Z7982 Long term (current) use of aspirin: Secondary | ICD-10-CM | POA: Diagnosis not present

## 2017-12-01 DIAGNOSIS — G8929 Other chronic pain: Secondary | ICD-10-CM

## 2017-12-01 DIAGNOSIS — M549 Dorsalgia, unspecified: Secondary | ICD-10-CM | POA: Diagnosis not present

## 2017-12-01 DIAGNOSIS — G894 Chronic pain syndrome: Secondary | ICD-10-CM | POA: Diagnosis not present

## 2017-12-01 NOTE — Progress Notes (Signed)
Patient's Name: Destiny [name]  MRN: [id number]  Referring Provider: Derinda Late, MD  DOB: 03/04/1924  PCP: Derinda Late, MD  DOS: 12/01/2017  Note by: Gaspar Cola, MD  Service setting: Ambulatory outpatient  Specialty: Interventional Pain Management  Location: [facility] (AMB) Pain Management Facility    Patient type: Established   Primary Reason(s) for Visit: Encounter for post-procedure evaluation of chronic illness with mild to moderate exacerbation CC: Back Pain (right lower )  HPI  [name] is a 84 y.o. year old, female patient, who comes today for a post-procedure evaluation. She has Chronic myofascial pain; Esophageal reflux; Benign essential hypertension; Migraine; Other and unspecified hyperlipidemia; Pseudophakia, both eyes; Salzmann's nodular dystrophy; Type II or unspecified type diabetes mellitus without mention of complication, not stated as uncontrolled; CVA (cerebral vascular accident) ([place]); Diabetes mellitus type 2, uncomplicated ([place]); Chronic low back pain (Primary Area of Pain) (Bilateral) (L>R); Chronic sacroiliac joint pain (Bilateral) (L>R); Chronic hip pain (Bilateral) (L>R); Chronic pain syndrome; Osteoarthritis of sacroiliac joints (Bilateral); Osteoarthritis of hip (Bilateral); Osteoarthritis of lumbar spine; Lumbar spondylosis; Long term current use of anticoagulant (Plavix); DDD (degenerative disc disease), lumbar; Lumbar facet hypertrophy; Lumbar facet syndrome (Bilateral); Trochanteric bursitis of hip (Right); Trigger point with back pain (buttocks area) (Right); and Groin pain (Right) on their problem list. Her primarily concern today is the Back Pain (right lower )  Pain Assessment: Location: Right Back Radiating: right hip Onset: More than a month ago Duration: Chronic pain Quality: Constant, Dull, Discomfort Severity: 3 /10 (self-reported pain score)  Note: Reported level is compatible with observation.                         When using our objective  Pain Scale, levels between 6 and 10/10 are said to belong in an emergency room, as it progressively worsens from a 6/10, described as severely limiting, requiring emergency care not usually available at an outpatient pain management facility. At a 6/10 level, communication becomes difficult and requires great effort. Assistance to reach the emergency department may be required. Facial flushing and profuse sweating along with potentially dangerous increases in heart rate and blood pressure will be evident. Effect on ADL: continues to feel the pain is not nearly as painful as before procedure.  easier now for her to get in and out of bed.  Timing: Constant Modifying factors: procedure. occassional heat when sitting.   [name] comes in today for post-procedure evaluation after the treatment done on 11/18/2017.  Further details on both, my assessment(s), as well as the proposed treatment plan, please see below.  Post-Procedure Assessment  11/18/2017 Procedure: Diagnostic right-sided trigger point injection of the gluteus medius muscle, below the iliac crest + right-sided SI joint block under fluoroscopic guidance, no sedation. Pre-procedure pain score:  8/10 Post-procedure pain score: 7/10 No initial benefit, possible due to rapid discharge after no sedation procedure, without enough time to allow full onset of block. Influential Factors: BMI: 23.83 kg/m Intra-procedural challenges: None observed.         Assessment challenges: None detected.              Reported side-effects: None.        Post-procedural adverse reactions or complications: None reported         Sedation: No sedation used. When no sedatives are used, the analgesic levels obtained are directly associated to the effectiveness of the local anesthetics. However, when sedation is provided, the level of analgesia  obtained during the initial 1 hour following the intervention, is believed to be the result of a combination of factors. These  factors may include, but are not limited to: 1. The effectiveness of the local anesthetics used. 2. The effects of the analgesic(s) and/or anxiolytic(s) used. 3. The degree of discomfort experienced by the patient at the time of the procedure. 4. The patients ability and reliability in recalling and recording the events. 5. The presence and influence of possible secondary gains and/or psychosocial factors. Reported result: Relief experienced during the 1st hour after the procedure: 0 % (Ultra-Short Term Relief)            Interpretative annotation: Clinically appropriate result. No IV Analgesic or Anxiolytic given, therefore benefits are completely due to Local Anesthetic effects.          Effects of local anesthetic: The analgesic effects attained during this period are directly associated to the localized infiltration of local anesthetics and therefore cary significant diagnostic value as to the etiological location, or anatomical origin, of the pain. Expected duration of relief is directly dependent on the pharmacodynamics of the local anesthetic used. Long-acting (4-6 hours) anesthetics used.  Reported result: Relief during the next 4 to 6 hour after the procedure: 100 % (Short-Term Relief)            Interpretative annotation: Clinically appropriate result. Analgesia during this period is likely to be Local Anesthetic-related.          Long-term benefit: Defined as the period of time past the expected duration of local anesthetics (1 hour for short-acting and 4-6 hours for long-acting). With the possible exception of prolonged sympathetic blockade from the local anesthetics, benefits during this period are typically attributed to, or associated with, other factors such as analgesic sensory neuropraxia, antiinflammatory effects, or beneficial biochemical changes provided by agents other than the local anesthetics.  Reported result: Extended relief following procedure: 70 % (Long-Term Relief)             Interpretative annotation: Clinically appropriate result. Good relief. No permanent benefit expected. Inflammation plays a part in the etiology to the pain.          Current benefits: Defined as reported results that persistent at this point in time.   Analgesia: 50-75 % Ms. Montoya reports that both, extremity and the axial pain improved with the treatment. Function: Ms. Ewing reports improvement in function ROM: Ms. Durio reports improvement in ROM Interpretative annotation: Ongoing benefit. Therapeutic success. Effective palliative intervention.          Interpretation: Results would suggest a successful diagnostic intervention.                  Plan:  Set up procedure as a PRN palliative treatment option for this patient.                 Laboratory Chemistry  Renal Function Markers Lab Results  Component Value Date   BUN 15 07/29/2017   CREATININE 0.71 07/29/2017   GFRAA >60 07/29/2017   GFRNONAA >60 07/29/2017                 Hepatic Function Markers Lab Results  Component Value Date   AST 23 02/09/2013   ALT 13 02/09/2013   ALBUMIN 3.9 02/09/2013   ALKPHOS 87 02/09/2013                 Electrolytes Lab Results  Component Value Date   NA 142 07/29/2017  K 4.3 07/29/2017   CL 103 07/29/2017   CALCIUM 9.7 07/29/2017                        Neuropathy Markers Lab Results  Component Value Date   HGBA1C 6.7 (H) 07/30/2017                 Coagulation Parameters Lab Results  Component Value Date   INR 0.94 07/30/2017   LABPROT 12.5 07/30/2017   APTT 34 07/30/2017   PLT 260 07/29/2017                 Cardiovascular Markers Lab Results  Component Value Date   TROPONINI <0.03 07/29/2017   HGB 13.1 07/29/2017   HCT 39.7 07/29/2017                 Note: Lab results reviewed.  Recent Diagnostic Imaging Results  DG C-Arm 1-60 Min-No Report Fluoroscopy was utilized by the requesting physician.  No radiographic  interpretation.   Complexity Note: I  personally reviewed the fluoroscopic imaging of the procedure.                        Meds   Current Outpatient Medications:  .  aspirin EC 81 MG tablet, Take 1 tablet (81 mg total) by mouth daily., Disp: 100 tablet, Rfl: 0 .  clindamycin (CLEOCIN) 150 MG capsule, Take 150 mg by mouth as needed. Prior to dental work, Disp: , Rfl:  .  clopidogrel (PLAVIX) 75 MG tablet, Take 1 tablet (75 mg total) by mouth daily., Disp: 30 tablet, Rfl: 0 .  doxepin (SINEQUAN) 10 MG capsule, TAKE ONE CAPSULE BY MOUTH EVERY NIGHT AT BEDTIME, Disp: , Rfl:  .  glucose blood (ONE TOUCH ULTRA TEST) test strip, EVERY DAY, Disp: , Rfl:  .  metFORMIN (GLUCOPHAGE) 500 MG tablet, TAKE ONE (1) TABLET BY MOUTH EVERY DAY, Disp: , Rfl:  .  ONETOUCH DELICA LANCETS FINE MISC, EVERY DAY, Disp: , Rfl:  .  telmisartan (MICARDIS) 80 MG tablet, Take 1 tablet by mouth daily., Disp: , Rfl:  .  amLODipine (NORVASC) 2.5 MG tablet, Take 1 tablet (2.5 mg total) by mouth daily. Take once daily if blood pressure > 160, Disp: 14 tablet, Rfl: 0 .  fluticasone (FLONASE) 50 MCG/ACT nasal spray, Place into the nose as needed. , Disp: , Rfl:   ROS  Constitutional: Denies any fever or chills Gastrointestinal: No reported hemesis, hematochezia, vomiting, or acute GI distress Musculoskeletal: Denies any acute onset joint swelling, redness, loss of ROM, or weakness Neurological: No reported episodes of acute onset apraxia, aphasia, dysarthria, agnosia, amnesia, paralysis, loss of coordination, or loss of consciousness  Allergies  Ms. Hodge is allergic to penicillins; ampicillin; hydrocodone-homatropine; amoxicillin; and tramadol.  PFSH  Drug: Ms. Strum  reports that she does not use drugs. Alcohol:  reports that she does not drink alcohol. Tobacco:  reports that  has never smoked. she has never used smokeless tobacco. Medical:  has a past medical history of Allergy, Diabetes mellitus without complication ([place]), Hyperlipidemia, Shingles, and  Stroke ([place]). Surgical: Ms. Fabry  has a past surgical history that includes Joint replacement; Knee surgery (Bilateral); Back surgery; Appendectomy; Tonsilectomy, adenoidectomy, bilateral myringotomy and tubes; and Foot surgery (Bilateral). Family: family history includes Stroke in her father and mother.  Constitutional Exam  General appearance: Well nourished, well developed, and well hydrated. In no apparent acute distress Vitals:  12/01/17 1337  BP: (!) 154/60  Pulse: 79  Resp: 16  Temp: 97.8 F (36.6 C)  TempSrc: Oral  SpO2: 100%  Weight: 122 lb (55.3 kg)  Height: 5' (1.524 m)   BMI Assessment: Estimated body mass index is 23.83 kg/m as calculated from the following:   Height as of this encounter: 5' (1.524 m).   Weight as of this encounter: 122 lb (55.3 kg).  BMI interpretation table: BMI level Category Range association with higher incidence of chronic pain  <18 kg/m2 Underweight   18.5-24.9 kg/m2 Ideal body weight   25-29.9 kg/m2 Overweight Increased incidence by 20%  30-34.9 kg/m2 Obese (Class I) Increased incidence by 68%  35-39.9 kg/m2 Severe obesity (Class II) Increased incidence by 136%  >40 kg/m2 Extreme obesity (Class III) Increased incidence by 254%   BMI Readings from Last 4 Encounters:  12/01/17 23.83 kg/m  11/18/17 24.02 kg/m  11/10/17 23.63 kg/m  10/28/17 24.41 kg/m   Wt Readings from Last 4 Encounters:  12/01/17 122 lb (55.3 kg)  11/18/17 123 lb (55.8 kg)  11/10/17 121 lb (54.9 kg)  10/28/17 125 lb (56.7 kg)  Psych/Mental status: Alert, oriented x 3 (person, place, & time)       Eyes: PERLA Respiratory: No evidence of acute respiratory distress  Cervical Spine Area Exam  Skin & Axial Inspection: No masses, redness, edema, swelling, or associated skin lesions Alignment: Symmetrical Functional ROM: Unrestricted ROM      Stability: No instability detected Muscle Tone/Strength: Functionally intact. No obvious neuro-muscular anomalies  detected. Sensory (Neurological): Unimpaired Palpation: No palpable anomalies              Upper Extremity (UE) Exam    Side: Right upper extremity  Side: Left upper extremity  Skin & Extremity Inspection: Skin color, temperature, and hair growth are WNL. No peripheral edema or cyanosis. No masses, redness, swelling, asymmetry, or associated skin lesions. No contractures.  Skin & Extremity Inspection: Skin color, temperature, and hair growth are WNL. No peripheral edema or cyanosis. No masses, redness, swelling, asymmetry, or associated skin lesions. No contractures.  Functional ROM: Unrestricted ROM          Functional ROM: Unrestricted ROM          Muscle Tone/Strength: Functionally intact. No obvious neuro-muscular anomalies detected.  Muscle Tone/Strength: Functionally intact. No obvious neuro-muscular anomalies detected.  Sensory (Neurological): Unimpaired          Sensory (Neurological): Unimpaired          Palpation: No palpable anomalies              Palpation: No palpable anomalies              Specialized Test(s): Deferred         Specialized Test(s): Deferred          Thoracic Spine Area Exam  Skin & Axial Inspection: No masses, redness, or swelling Alignment: Symmetrical Functional ROM: Unrestricted ROM Stability: No instability detected Muscle Tone/Strength: Functionally intact. No obvious neuro-muscular anomalies detected. Sensory (Neurological): Unimpaired Muscle strength & Tone: No palpable anomalies  Lumbar Spine Area Exam  Skin & Axial Inspection: No masses, redness, or swelling Alignment: Symmetrical Functional ROM: Improved after treatment      Stability: No instability detected Muscle Tone/Strength: Functionally intact. No obvious neuro-muscular anomalies detected. Sensory (Neurological): Unimpaired Palpation: No palpable anomalies       Provocative Tests: Lumbar Hyperextension and rotation test: evaluation deferred today       Lumbar  Lateral bending test:  evaluation deferred today       Patrick's Maneuver: Improved after treatment                    Gait & Posture Assessment  Ambulation: Unassisted Gait: Relatively normal for age and body habitus Posture: WNL   Lower Extremity Exam    Side: Right lower extremity  Side: Left lower extremity  Skin & Extremity Inspection: Skin color, temperature, and hair growth are WNL. No peripheral edema or cyanosis. No masses, redness, swelling, asymmetry, or associated skin lesions. No contractures.  Skin & Extremity Inspection: Skin color, temperature, and hair growth are WNL. No peripheral edema or cyanosis. No masses, redness, swelling, asymmetry, or associated skin lesions. No contractures.  Functional ROM: Unrestricted ROM          Functional ROM: Unrestricted ROM          Muscle Tone/Strength: Functionally intact. No obvious neuro-muscular anomalies detected.  Muscle Tone/Strength: Functionally intact. No obvious neuro-muscular anomalies detected.  Sensory (Neurological): Unimpaired  Sensory (Neurological): Unimpaired  Palpation: No palpable anomalies  Palpation: No palpable anomalies   Assessment  Primary Diagnosis & Pertinent Problem List: The primary encounter diagnosis was Chronic low back pain (Primary Area of Pain) (Bilateral) (L>R). Diagnoses of Chronic sacroiliac joint pain (Bilateral) (L>R), Trigger point with back pain (buttocks area) (Right), Chronic myofascial pain, and Long term current use of anticoagulant (Plavix) were also pertinent to this visit.  Status Diagnosis  Controlled Controlled Controlled 1. Chronic low back pain (Primary Area of Pain) (Bilateral) (L>R)   2. Chronic sacroiliac joint pain (Bilateral) (L>R)   3. Trigger point with back pain (buttocks area) (Right)   4. Chronic myofascial pain   5. Long term current use of anticoagulant (Plavix)     Problems updated and reviewed during this visit: Problem  Long term current use of anticoagulant (Plavix)   PLAVIX  ANTICOAGULATION (Stop 7-10 days before procedures)    Plan of Care  Pharmacotherapy (Medications Ordered): No orders of the defined types were placed in this encounter.  Medications administered today: Maddix T. Renault had no medications administered during this visit.   Procedure Orders     SACROILIAC JOINT INJECTION Lab Orders  No laboratory test(s) ordered today   Imaging Orders  No imaging studies ordered today   Referral Orders  No referral(s) requested today    Interventional management options: Planned, scheduled, and/or pending:   None at this point. Needs an MRI of lumbar region, but she wants to wait, since she feel better at this time.   Considering:   Diagnostic bilateral lumbar facet block  Possible bilateral lumbar facet RFA  Diagnostic bilateral sacroiliac joint block  Possible bilateral sacroiliac joint RFA  Diagnostic bilateral intra-articular hip joint injection  Diagnostic bilateral femoral nerve block + obturator nerve block  Possible bilateral femoral nerve + obturator nerve RFA  Diagnostic right-sided trochanteric bursa injection    Palliative PRN treatment(s):   Palliative right-sided sacroiliac joint block under fluoroscopic guidance, no sedation.   Provider-requested follow-up: Return if symptoms worsen or fail to improve, for PRN Procedure.  No future appointments. Primary Care Physician: Derinda Late, MD Location: [facility] Note by: Gaspar Cola, MD Date: 12/01/2017; Time: 4:40 PM

## 2017-12-29 ENCOUNTER — Telehealth: Payer: Self-pay | Admitting: Pain Medicine

## 2017-12-29 NOTE — Telephone Encounter (Signed)
Destiny [name] (daughter) [phone]  After 11:15 please. Would like to get her mother in for MRI, still having a lot of pain and trouble walking. Last visit Dr. Dossie Arbour discussed having MRI and they would like to do that before any more procedures. Please have Dr. Dossie Arbour put in order so this can be approved and scheduled. Call daughter to sched as she is transportation.

## 2017-12-29 NOTE — Telephone Encounter (Signed)
Will route and inform Dr. Dossie Arbour to ok and order if appropriate.

## 2018-01-01 ENCOUNTER — Telehealth: Payer: Self-pay

## 2018-01-01 ENCOUNTER — Other Ambulatory Visit: Payer: Self-pay | Admitting: Pain Medicine

## 2018-01-01 DIAGNOSIS — G8929 Other chronic pain: Secondary | ICD-10-CM

## 2018-01-01 DIAGNOSIS — M545 Low back pain: Secondary | ICD-10-CM

## 2018-01-01 DIAGNOSIS — M47816 Spondylosis without myelopathy or radiculopathy, lumbar region: Secondary | ICD-10-CM

## 2018-01-01 DIAGNOSIS — M5136 Other intervertebral disc degeneration, lumbar region: Secondary | ICD-10-CM

## 2018-01-01 NOTE — Telephone Encounter (Signed)
Daughter called and states Dr told them to call and we would schedule MRI for her mom Destiny [name] If pain continued in her back and hip

## 2018-01-01 NOTE — Telephone Encounter (Signed)
MRI was ordered per FN. Message forwarded to Rozetta Nunnery to get ordered and call patient.

## 2018-01-01 NOTE — Telephone Encounter (Signed)
Need to get Dr. Dossie Arbour to order MRI as he discussed at last visit.

## 2018-01-13 ENCOUNTER — Ambulatory Visit: Admission: RE | Admit: 2018-01-13 | Payer: Medicare Other | Source: Ambulatory Visit

## 2018-01-14 ENCOUNTER — Ambulatory Visit: Payer: Medicare Other

## 2018-01-14 ENCOUNTER — Ambulatory Visit
Admission: RE | Admit: 2018-01-14 | Discharge: 2018-01-14 | Disposition: A | Discharge status: Home / Self Care | Payer: Medicare Other | Source: Ambulatory Visit | Attending: Pain Medicine | Admitting: Pain Medicine

## 2018-01-14 DIAGNOSIS — M47896 Other spondylosis, lumbar region: Secondary | ICD-10-CM | POA: Insufficient documentation

## 2018-01-14 DIAGNOSIS — M545 Low back pain: Secondary | ICD-10-CM | POA: Insufficient documentation

## 2018-01-14 DIAGNOSIS — M47816 Spondylosis without myelopathy or radiculopathy, lumbar region: Secondary | ICD-10-CM | POA: Insufficient documentation

## 2018-01-14 DIAGNOSIS — M5136 Other intervertebral disc degeneration, lumbar region: Secondary | ICD-10-CM | POA: Diagnosis not present

## 2018-01-14 DIAGNOSIS — G8929 Other chronic pain: Secondary | ICD-10-CM | POA: Diagnosis present

## 2018-01-14 DIAGNOSIS — M48061 Spinal stenosis, lumbar region without neurogenic claudication: Secondary | ICD-10-CM | POA: Diagnosis not present

## 2018-01-15 ENCOUNTER — Telehealth: Payer: Self-pay | Admitting: Student in an Organized Health Care Education/Training Program

## 2018-01-15 NOTE — Telephone Encounter (Addendum)
Daughter, Destiny [name],  is requesting patient get an appt. Next week to see if patient can have RFA. Daughter is planning on a trip in 8 weeks and would like to see her mother more comfortable before she leaves. Patient will need to stop Plavix before procedure.  I scheduled patient to come in on Wed.  01-21-18  [phone]

## 2018-01-21 ENCOUNTER — Ambulatory Visit: Payer: Medicare Other | Admitting: Student in an Organized Health Care Education/Training Program

## 2018-01-22 ENCOUNTER — Ambulatory Visit
Payer: Medicare Other | Attending: Student in an Organized Health Care Education/Training Program | Admitting: Nurse Practitioner

## 2018-01-22 ENCOUNTER — Encounter: Payer: Self-pay | Admitting: Nurse Practitioner

## 2018-01-22 ENCOUNTER — Other Ambulatory Visit: Payer: Self-pay

## 2018-01-22 VITALS — BP 175/64 | HR 76 | Temp 97.7°F | Resp 16 | Ht 60.0 in | Wt 125.0 lb

## 2018-01-22 DIAGNOSIS — G8929 Other chronic pain: Secondary | ICD-10-CM | POA: Diagnosis present

## 2018-01-22 DIAGNOSIS — I10 Essential (primary) hypertension: Secondary | ICD-10-CM | POA: Insufficient documentation

## 2018-01-22 DIAGNOSIS — Z961 Presence of intraocular lens: Secondary | ICD-10-CM | POA: Diagnosis not present

## 2018-01-22 DIAGNOSIS — M47816 Spondylosis without myelopathy or radiculopathy, lumbar region: Secondary | ICD-10-CM | POA: Insufficient documentation

## 2018-01-22 DIAGNOSIS — Z7982 Long term (current) use of aspirin: Secondary | ICD-10-CM | POA: Diagnosis not present

## 2018-01-22 DIAGNOSIS — Z7902 Long term (current) use of antithrombotics/antiplatelets: Secondary | ICD-10-CM | POA: Insufficient documentation

## 2018-01-22 DIAGNOSIS — E785 Hyperlipidemia, unspecified: Secondary | ICD-10-CM | POA: Insufficient documentation

## 2018-01-22 DIAGNOSIS — R1030 Lower abdominal pain, unspecified: Secondary | ICD-10-CM | POA: Insufficient documentation

## 2018-01-22 DIAGNOSIS — K219 Gastro-esophageal reflux disease without esophagitis: Secondary | ICD-10-CM | POA: Insufficient documentation

## 2018-01-22 DIAGNOSIS — G894 Chronic pain syndrome: Secondary | ICD-10-CM | POA: Insufficient documentation

## 2018-01-22 DIAGNOSIS — Z79899 Other long term (current) drug therapy: Secondary | ICD-10-CM | POA: Insufficient documentation

## 2018-01-22 DIAGNOSIS — M7918 Myalgia, other site: Secondary | ICD-10-CM | POA: Diagnosis not present

## 2018-01-22 DIAGNOSIS — M545 Low back pain: Secondary | ICD-10-CM | POA: Diagnosis present

## 2018-01-22 DIAGNOSIS — M48061 Spinal stenosis, lumbar region without neurogenic claudication: Secondary | ICD-10-CM | POA: Insufficient documentation

## 2018-01-22 DIAGNOSIS — E119 Type 2 diabetes mellitus without complications: Secondary | ICD-10-CM | POA: Diagnosis not present

## 2018-01-22 DIAGNOSIS — M5135 Other intervertebral disc degeneration, thoracolumbar region: Secondary | ICD-10-CM | POA: Insufficient documentation

## 2018-01-22 DIAGNOSIS — M5136 Other intervertebral disc degeneration, lumbar region: Secondary | ICD-10-CM | POA: Insufficient documentation

## 2018-01-22 DIAGNOSIS — Z8673 Personal history of transient ischemic attack (TIA), and cerebral infarction without residual deficits: Secondary | ICD-10-CM | POA: Insufficient documentation

## 2018-01-22 DIAGNOSIS — Z7984 Long term (current) use of oral hypoglycemic drugs: Secondary | ICD-10-CM | POA: Diagnosis not present

## 2018-01-22 NOTE — Progress Notes (Addendum)
Patient's Name: Destiny [name]  MRN: [id number]  Referring Provider: Derinda Late, MD  DOB: May 19, 1924  PCP: Derinda Late, MD  DOS: 01/22/2018  Note by: Dionisio David, NP  Service setting: Ambulatory outpatient  Specialty: Interventional Pain Management  Location: [facility] (AMB) Pain Management Facility    Patient type: Established   Supervising Physician Attestation  I, the supervising provider, attest that I have reviewed the case with my NP and provided her with my opinion and suggested treatment plan. I also discussed with Ms. Rosten the benefits, risks, side effects, alternatives, likelihood of achieving goals and potential problems associated with the treatment alternatives that I have included in her treatment plan.  Plan of Care  See complete note by mid-level provider.  Location: [facility] Note by: Dionisio David, NP Date: 01/22/2018; Time: 11:11 AM

## 2018-01-22 NOTE — Patient Instructions (Addendum)
Facet Joint Block The facet joints connect the bones of the spine (vertebrae). They make it possible for you to bend, twist, and make other movements with your spine. They also keep you from bending too far, twisting too far, and making other excessive movements. A facet joint block is a procedure where a numbing medicine (anesthetic) is injected into a facet joint. Often, a type of anti-inflammatory medicine called a steroid is also injected. A facet joint block may be done to diagnose neck or back pain. If the pain gets better after a facet joint block, it means the pain is probably coming from the facet joint. If the pain does not get better, it means the pain is probably not coming from the facet joint. A facet joint block may also be done to relieve neck or back pain caused by an inflamed facet joint. A facet joint block is only done to relieve pain if the pain does not improve with other methods, such as medicine, exercise programs, and physical therapy. Tell a health care provider about:  Any allergies you have.  All medicines you are taking, including vitamins, herbs, eye drops, creams, and over-the-counter medicines.  Any problems you or family members have had with anesthetic medicines.  Any blood disorders you have.  Any surgeries you have had.  Any medical conditions you have.  Whether you are pregnant or may be pregnant. What are the risks? Generally, this is a safe procedure. However, problems may occur, including:  Bleeding.  Injury to a nerve near the injection site.  Pain at the injection site.  Weakness or numbness in areas controlled by nerves near the injection site.  Infection.  Temporary fluid retention.  Allergic reactions to medicines or dyes.  Injury to other structures or organs near the injection site.  What happens before the procedure?  Follow instructions from your health care provider about eating or drinking restrictions.  Ask your health care  provider about: ? Changing or stopping your regular medicines. This is especially important if you are taking diabetes medicines or blood thinners. ? Taking medicines such as aspirin and ibuprofen. These medicines can thin your blood. Do not take these medicines before your procedure if your health care provider instructs you not to.  Do not take any new dietary supplements or medicines without asking your health care provider first.  Plan to have someone take you home after the procedure. What happens during the procedure?  You may need to remove your clothing and dress in an open-back gown.  The procedure will be done while you are lying on an X-ray table. You will most likely be asked to lie on your stomach, but you may be asked to lie in a different position if an injection will be made in your neck.  Machines will be used to monitor your oxygen levels, heart rate, and blood pressure.  If an injection will be made in your neck, an IV tube will be inserted into one of your veins. Fluids and medicine will flow directly into your body through the IV tube.  The area over the facet joint where the injection will be made will be cleaned with soap. The surrounding skin will be covered with clean drapes.  A numbing medicine (local anesthetic) will be applied to your skin. Your skin may sting or burn for a moment.  A video X-ray machine (fluoroscopy) will be used to locate the joint. In some cases, a CT scan may be used.  A   contrast dye may be injected into the facet joint area to help locate the joint.  When the joint is located, an anesthetic will be injected into the joint through the needle.  Your health care provider will ask you whether you feel pain relief. If you do feel relief, a steroid may be injected to provide pain relief for a longer period of time. If you do not feel relief or feel only partial relief, additional injections of an anesthetic may be made in other facet  joints.  The needle will be removed.  Your skin will be cleaned.  A bandage (dressing) will be applied over each injection site. The procedure may vary among health care providers and hospitals. What happens after the procedure?  You will be observed for 15-30 minutes before being allowed to go home. This information is not intended to replace advice given to you by your health care provider. Make sure you discuss any questions you have with your health care provider. Document Released: 02/19/2007 Document Revised: 11/01/2015 Document Reviewed: 06/26/2015 Elsevier Interactive Patient Education  2018 [employer]  

## 2018-01-22 NOTE — Progress Notes (Signed)
Safety precautions to be maintained throughout the outpatient stay will include: orient to surroundings, keep bed in low position, maintain call bell within reach at all times, provide assistance with transfer out of bed and ambulation.  

## 2018-01-22 NOTE — Progress Notes (Signed)
Duplicate

## 2018-01-22 NOTE — Progress Notes (Addendum)
Patient's Name: Destiny [name]  MRN: [id number]  Referring Provider: Derinda Late, MD  DOB: 17-May-1924  PCP: Derinda Late, MD  DOS: 01/22/2018  Note by: Vevelyn Francois NP  Service setting: Ambulatory outpatient  Specialty: Interventional Pain Management  Location: [facility] (AMB) Pain Management Facility    Patient type: Established    Primary Reason(s) for Visit: Evaluation of chronic illnesses with exacerbation, or progression (Level of risk: moderate) CC: Back Pain (lower)  HPI  Destiny [name] is a 84 y.o. year old, female patient, who comes today for a follow-up evaluation. She has Chronic myofascial pain; Esophageal reflux; Benign essential hypertension; Migraine; Other and unspecified hyperlipidemia; Pseudophakia, both eyes; Salzmann's nodular dystrophy; Type II or unspecified type diabetes mellitus without mention of complication, not stated as uncontrolled; CVA (cerebral vascular accident) ([place]); Diabetes mellitus type 2, uncomplicated ([place]); Chronic low back pain (Primary Area of Pain) (Bilateral) (L>R); Chronic sacroiliac joint pain (Bilateral) (L>R); Chronic hip pain (Bilateral) (L>R); Chronic pain syndrome; Osteoarthritis of sacroiliac joints (Bilateral); Osteoarthritis of hip (Bilateral); Osteoarthritis of lumbar spine; Lumbar spondylosis; Long term current use of anticoagulant (Plavix); DDD (degenerative disc disease), lumbar; Lumbar facet hypertrophy; Lumbar facet syndrome (Bilateral); Trochanteric bursitis of hip (Right); Trigger point with back pain (buttocks area) (Right); Groin pain (Right); and Spondylosis without myelopathy or radiculopathy, lumbosacral region on their problem list. Destiny [name] was last seen on 09/08/2017. Her primarily concern today is the Back Pain (lower)  Pain Assessment: Location: Lower Back Radiating: right hip Onset: More than a month ago Duration: Chronic pain Quality: Dull, Constant, Discomfort Severity: 7 /10 (self-reported pain score)  Note: Reported level  is compatible with observation.                         When using our objective Pain Scale, levels between 6 and 10/10 are said to belong in an emergency room, as it progressively worsens from a 6/10, described as severely limiting, requiring emergency care not usually available at an outpatient pain management facility. At a 6/10 level, communication becomes difficult and requires great effort. Assistance to reach the emergency department may be required. Facial flushing and profuse sweating along with potentially dangerous increases in heart rate and blood pressure will be evident. Timing: Constant Modifying factors: movement  Further details on both, my assessment(s), as well as the proposed treatment plan, please see below. She is in today for an evaluation. She continues to have right sided back and hip pain. She denies any leg pain.She admits that the SI joint injections were effective but the relief is short term. She is interested in more long term relief. She did have an MRI completed however she has not viewed this.  She just has pain with all activity.    Laboratory Chemistry  Inflammation Markers (CRP: Acute Phase) (ESR: Chronic Phase) No results found for: CRP, ESRSEDRATE, LATICACIDVEN                       Rheumatology Markers No results found for: RF, ANA, Therisa [place], [facility]                      Renal Function Markers Lab Results  Component Value Date   BUN 20 02/02/2018   CREATININE 0.74 02/02/2018   GFRAA >60 02/02/2018   GFRNONAA >60 02/02/2018  Hepatic Function Markers Lab Results  Component Value Date   AST 23 02/09/2013   ALT 13 02/09/2013   ALBUMIN 3.9 02/09/2013   ALKPHOS 87 02/09/2013                        Electrolytes Lab Results  Component Value Date   NA 139 02/02/2018   K 4.2 02/02/2018   CL 103 02/02/2018   CALCIUM 9.7 02/02/2018                        Neuropathy Markers Lab Results   Component Value Date   HGBA1C 6.7 (H) 07/30/2017                        Bone Pathology Markers No results found for: [place], [id number], [id number], [id number], 25OHVITD1, 25OHVITD2, 25OHVITD3, TESTOFREE, TESTOSTERONE                       Coagulation Parameters Lab Results  Component Value Date   INR 0.94 07/30/2017   LABPROT 12.5 07/30/2017   APTT 34 07/30/2017   PLT 263 02/02/2018                        Cardiovascular Markers Lab Results  Component Value Date   TROPONINI <0.03 02/02/2018   HGB 13.3 02/02/2018   HCT 39.4 02/02/2018                         CA Markers No results found for: CEA, CA125, LABCA2                      Note: Lab results reviewed.  Recent Diagnostic Imaging Review  Lumbosacral Imaging: Lumbar MR wo contrast:  Results for orders placed during the hospital encounter of 01/14/18  MR LUMBAR SPINE WO CONTRAST   Narrative CLINICAL DATA:  Lumbar disc degeneration with chronic low back pain. History of back surgery  EXAM: MRI LUMBAR SPINE WITHOUT CONTRAST  TECHNIQUE: Multiplanar, multisequence MR imaging of the lumbar spine was performed. No intravenous contrast was administered.  COMPARISON:  Lumbar MRI 02/25/2009  FINDINGS: Segmentation:  Normal.  Lowest disc space L5-S1  Alignment:  Normal  Vertebrae:  Normal bone marrow.  Negative for fracture or mass.  Conus medullaris and cauda equina: Conus extends to the L2-3 level. Conus and cauda equina appear normal.  Paraspinal and other soft tissues: Bilateral renal cysts. No retroperitoneal mass. Paraspinous soft tissues normal.  Disc levels:  T12-L1: Mild disc degeneration  L1-2: Mild disc degeneration and mild facet degeneration without stenosis  L2-3: Moderate disc degeneration with disc space narrowing and spondylosis. Diffuse endplate spurring. Bilateral facet degeneration and mild spinal stenosis. Progressive stenosis since 2010  L3-4: Moderate to advanced disc degeneration  and spondylosis with diffuse endplate spurring. Bilateral facet hypertrophy. Mild spinal stenosis with mild progression  L4-5: Moderate to advanced disc degeneration and spondylosis. Diffuse endplate spurring. Bilateral facet hypertrophy. Mild spinal stenosis with progression. Subarticular stenosis left greater than right  L5-S1: Moderate disc degeneration with disc space narrowing and spurring. Bilateral facet degeneration. Negative for stenosis  IMPRESSION: Progressive disc degeneration and spondylosis since 2010. Mild spinal stenosis has progressed at L2-3, L3-4, and L4-5. No focal disc protrusion identified.   Electronically Signed   By: Franchot Gallo M.D.   On: 01/14/2018 12:46   Lumbar  DG Bending views:  Results for orders placed during the hospital encounter of 09/08/17  DG Lumbar Spine Complete W/Bend   Narrative CLINICAL DATA:  In mid low back pain for 3 weeks, no acute injury  EXAM: LUMBAR SPINE - COMPLETE WITH BENDING VIEWS  COMPARISON:  MR lumbar spine of 02/25/2009  FINDINGS: The lumbar vertebrae remain in normal alignment. There is diffuse degenerative disc disease throughout lumbar spine with loss of disc space and sclerosis with spurring. No compression deformity is seen. The SI joints appear corticated. A moderate amount of feces is noted diffusely throughout the colon.  IMPRESSION: 1. Normal alignment with diffuse degenerative disc disease. 2. Moderate amount of feces diffusely throughout the colon.   Electronically Signed   By: Ivar Drape M.D.   On: 09/09/2017 08:30   Sacroiliac Joint Imaging: Sacroiliac Joint DG:  Results for orders placed during the hospital encounter of 09/08/17  DG Si Joints   Narrative CLINICAL DATA:  Intermittent pain in the low back for 3 weeks, no acute injury  EXAM: BILATERAL SACROILIAC JOINTS - 3+ VIEW  COMPARISON:  None.  FINDINGS: The SI joints appear well corticated with mild degenerative change present.  No acute abnormality is seen. The pelvic rami are intact. Only mild degenerative changes noted within the hip joint spaces.  IMPRESSION: 1. Mild degenerative change of the SI joints. No evidence of sacroiliitis. 2. Degenerative change in the lower lumbar spine with lesser degenerative change in the hips .   Electronically Signed   By: Ivar Drape M.D.   On: 09/09/2017 08:32   Complexity Note: Imaging results reviewed. Results shared with Ms. Schuld, using Layman's terms.                         Meds   Current Outpatient Medications:  .  aspirin EC 81 MG tablet, Take 1 tablet (81 mg total) by mouth daily., Disp: 100 tablet, Rfl: 0 .  doxepin (SINEQUAN) 10 MG capsule, TAKE ONE CAPSULE BY MOUTH EVERY NIGHT AT BEDTIME, Disp: , Rfl:  .  glucose blood (ONE TOUCH ULTRA TEST) test strip, EVERY DAY, Disp: , Rfl:  .  metFORMIN (GLUCOPHAGE) 500 MG tablet, TAKE ONE (1) TABLET BY MOUTH EVERY DAY, Disp: , Rfl:  .  ONETOUCH DELICA LANCETS FINE MISC, EVERY DAY, Disp: , Rfl:  .  telmisartan (MICARDIS) 80 MG tablet, Take 1 tablet by mouth daily., Disp: , Rfl:  .  amLODipine (NORVASC) 2.5 MG tablet, Take 1 tablet (2.5 mg total) by mouth daily. Take once daily if blood pressure > 160, Disp: 14 tablet, Rfl: 0 .  fluticasone (FLONASE) 50 MCG/ACT nasal spray, Place into the nose as needed. , Disp: , Rfl:  .  hydrochlorothiazide (HYDRODIURIL) 12.5 MG tablet, Take 1 tablet (12.5 mg total) by mouth daily., Disp: 7 tablet, Rfl: 0  ROS  Constitutional: Denies any fever or chills Gastrointestinal: No reported hemesis, hematochezia, vomiting, or acute GI distress Musculoskeletal: Denies any acute onset joint swelling, redness, loss of ROM, or weakness Neurological: No reported episodes of acute onset apraxia, aphasia, dysarthria, agnosia, amnesia, paralysis, loss of coordination, or loss of consciousness  Allergies  Ms. Flud is allergic to penicillins; ampicillin; hydrocodone-homatropine; amoxicillin; and  tramadol.  PFSH  Drug: Ms. Servantes  reports that she does not use drugs. Alcohol:  reports that she does not drink alcohol. Tobacco:  reports that she has never smoked. She has never used smokeless tobacco. Medical:  has a past  medical history of Allergy, Diabetes mellitus without complication ([place]), Hyperlipidemia, Shingles, and Stroke ([place]). Surgical: Ms. Hunsucker  has a past surgical history that includes Joint replacement; Knee surgery (Bilateral); Back surgery; Appendectomy; Tonsilectomy, adenoidectomy, bilateral myringotomy and tubes; and Foot surgery (Bilateral). Family: family history includes Stroke in her father and mother.  Constitutional Exam  General appearance: Well nourished, well developed, and well hydrated. In no apparent acute distress Vitals:   01/22/18 0919  BP: (!) 175/64  Pulse: 76  Resp: 16  Temp: 97.7 F (36.5 C)  TempSrc: Oral  SpO2: 99%  Weight: 125 lb (56.7 kg)  Height: 5' (1.524 m)   BMI Assessment: Estimated body mass index is 24.41 kg/m as calculated from the following:   Height as of this encounter: 5' (1.524 m).   Weight as of this encounter: 125 lb (56.7 kg). Psych/Mental status: Alert, oriented x 3 (person, place, & time)       Eyes: PERLA Respiratory: No evidence of acute respiratory distress   Lumbar Spine Area Exam  Skin & Axial Inspection: No masses, redness, or swelling Alignment: Symmetrical Functional ROM: Unrestricted ROM      Stability: No instability detected Muscle Tone/Strength: Functionally intact. No obvious neuro-muscular anomalies detected. Sensory (Neurological): Unimpaired Palpation: Complains of area being tender to palpation       Provocative Tests: Lumbar Hyperextension and rotation test: Positive on the right for facet joint pain. Lumbar Lateral bending test: Positive ipsilateral radicular pain, on the right. Positive for right-sided foraminal stenosis. Patrick's Maneuver: Positive for right-sided S-I arthralgia               Gait & Posture Assessment  Ambulation: Unassisted Gait: Relatively normal for age and body habitus Posture: WNL   Lower Extremity Exam    Side: Right lower extremity  Side: Left lower extremity  Skin & Extremity Inspection: Skin color, temperature, and hair growth are WNL. No peripheral edema or cyanosis. No masses, redness, swelling, asymmetry, or associated skin lesions. No contractures.  Skin & Extremity Inspection: Skin color, temperature, and hair growth are WNL. No peripheral edema or cyanosis. No masses, redness, swelling, asymmetry, or associated skin lesions. No contractures.  Functional ROM: Unrestricted ROM          Functional ROM: Unrestricted ROM          Muscle Tone/Strength: Functionally intact. No obvious neuro-muscular anomalies detected.  Muscle Tone/Strength: Functionally intact. No obvious neuro-muscular anomalies detected.  Sensory (Neurological): Unimpaired  Sensory (Neurological): Unimpaired  Palpation: No palpable anomalies  Palpation: No palpable anomalies   Assessment  Primary Diagnosis & Pertinent Problem List: The primary encounter diagnosis was Lumbar spondylosis. Diagnoses of Lumbar facet hypertrophy, Osteoarthritis of lumbar spine, and Chronic pain syndrome were also pertinent to this visit.  Status Diagnosis  Worsening Worsening Worsening 1. Lumbar spondylosis   2. Lumbar facet hypertrophy   3. Osteoarthritis of lumbar spine   4. Chronic pain syndrome     Problems updated and reviewed during this visit: No problems updated. Plan of Care  Pharmacotherapy (Medications Ordered): No orders of the defined types were placed in this encounter.  New Prescriptions   No medications on file   Medications administered today: Maricela T. Levings had no medications administered during this visit. Lab-work, procedure(s), and/or referral(s): Orders Placed This Encounter  Procedures  . LUMBAR FACET(MEDIAL BRANCH NERVE BLOCK) MBNB   Imaging and/or  referral(s): None  Interventional management options: Planned, scheduled, and/or pending:   Diagnostic right lumbar facet block   Considering:  Diagnostic bilateral lumbar facet block Possible bilateral lumbar facet RFA Diagnostic bilateral sacroiliac joint block Possible bilateral sacroiliac joint RFA Diagnostic bilateral intra-articular hip joint injection Diagnostic bilateral femoral nerve block + obturator nerve block Possible bilateral femoral nerve + obturator nerve RFA Diagnostic right-sided trochanteric bursa injection   Palliative PRN treatment(s):   Palliative right-sided sacroiliac joint block   Provider-requested follow-up: Return for Procedure(w/Sedation), w/ Dr. Dossie Arbour, ([facility]).  Future Appointments  Date Time Provider [place]  02/18/2018 11:15 AM Milinda Pointer, MD [facility] None   Primary Care Physician: Derinda Late, MD Location: [facility] Out[facility] Management Facility Note by: Vevelyn Francois NP Date: 01/22/2018; Time: 11:13 AM  Pain Score Disclaimer: We use the NRS-11 scale. This is a self-reported, subjective measurement of pain severity with only modest accuracy. It is used primarily to identify changes within a particular patient. It must be understood that outpatient pain scales are significantly less accurate that those used for research, where they can be applied under ideal controlled circumstances with minimal exposure to variables. In reality, the score is likely to be a combination of pain intensity and pain affect, where pain affect describes the degree of emotional arousal or changes in action readiness caused by the sensory experience of pain. Factors such as social and work situation, setting, emotional state, anxiety levels, expectation, and prior pain experience may influence pain perception and show large inter-individual differences that may also be affected by time variables.  Patient instructions provided during this  appointment: Patient Instructions    Facet Joint Block The facet joints connect the bones of the spine (vertebrae). They make it possible for you to bend, twist, and make other movements with your spine. They also keep you from bending too far, twisting too far, and making other excessive movements. A facet joint block is a procedure where a numbing medicine (anesthetic) is injected into a facet joint. Often, a type of anti-inflammatory medicine called a steroid is also injected. A facet joint block may be done to diagnose neck or back pain. If the pain gets better after a facet joint block, it means the pain is probably coming from the facet joint. If the pain does not get better, it means the pain is probably not coming from the facet joint. A facet joint block may also be done to relieve neck or back pain caused by an inflamed facet joint. A facet joint block is only done to relieve pain if the pain does not improve with other methods, such as medicine, exercise programs, and physical therapy. Tell a health care provider about:  Any allergies you have.  All medicines you are taking, including vitamins, herbs, eye drops, creams, and over-the-counter medicines.  Any problems you or family members have had with anesthetic medicines.  Any blood disorders you have.  Any surgeries you have had.  Any medical conditions you have.  Whether you are pregnant or may be pregnant. What are the risks? Generally, this is a safe procedure. However, problems may occur, including:  Bleeding.  Injury to a nerve near the injection site.  Pain at the injection site.  Weakness or numbness in areas controlled by nerves near the injection site.  Infection.  Temporary fluid retention.  Allergic reactions to medicines or dyes.  Injury to other structures or organs near the injection site.  What happens before the procedure?  Follow instructions from your health care provider about eating or  drinking restrictions.  Ask your health care provider about: ? Changing  or stopping your regular medicines. This is especially important if you are taking diabetes medicines or blood thinners. ? Taking medicines such as aspirin and ibuprofen. These medicines can thin your blood. Do not take these medicines before your procedure if your health care provider instructs you not to.  Do not take any new dietary supplements or medicines without asking your health care provider first.  Plan to have someone take you home after the procedure. What happens during the procedure?  You may need to remove your clothing and dress in an open-back gown.  The procedure will be done while you are lying on an X-ray table. You will most likely be asked to lie on your stomach, but you may be asked to lie in a different position if an injection will be made in your neck.  Machines will be used to monitor your oxygen levels, heart rate, and blood pressure.  If an injection will be made in your neck, an IV tube will be inserted into one of your veins. Fluids and medicine will flow directly into your body through the IV tube.  The area over the facet joint where the injection will be made will be cleaned with soap. The surrounding skin will be covered with clean drapes.  A numbing medicine (local anesthetic) will be applied to your skin. Your skin may sting or burn for a moment.  A video X-ray machine (fluoroscopy) will be used to locate the joint. In some cases, a CT scan may be used.  A contrast dye may be injected into the facet joint area to help locate the joint.  When the joint is located, an anesthetic will be injected into the joint through the needle.  Your health care provider will ask you whether you feel pain relief. If you do feel relief, a steroid may be injected to provide pain relief for a longer period of time. If you do not feel relief or feel only partial relief, additional injections of an  anesthetic may be made in other facet joints.  The needle will be removed.  Your skin will be cleaned.  A bandage (dressing) will be applied over each injection site. The procedure may vary among health care providers and hospitals. What happens after the procedure?  You will be observed for 15-30 minutes before being allowed to go home. This information is not intended to replace advice given to you by your health care provider. Make sure you discuss any questions you have with your health care provider. Document Released: 02/19/2007 Document Revised: 11/01/2015 Document Reviewed: 06/26/2015 Elsevier Interactive Patient Education  [employer].

## 2018-01-29 ENCOUNTER — Ambulatory Visit (HOSPITAL_BASED_OUTPATIENT_CLINIC_OR_DEPARTMENT_OTHER): Payer: Medicare Other | Admitting: Pain Medicine

## 2018-01-29 ENCOUNTER — Encounter: Payer: Self-pay | Admitting: Pain Medicine

## 2018-01-29 ENCOUNTER — Ambulatory Visit
Admission: RE | Admit: 2018-01-29 | Discharge: 2018-01-29 | Disposition: A | Discharge status: Home / Self Care | Payer: Medicare Other | Source: Ambulatory Visit | Attending: Pain Medicine | Admitting: Pain Medicine

## 2018-01-29 VITALS — BP 154/79 | HR 82 | Temp 97.6°F | Resp 18 | Ht 62.0 in | Wt 125.0 lb

## 2018-01-29 DIAGNOSIS — M47817 Spondylosis without myelopathy or radiculopathy, lumbosacral region: Secondary | ICD-10-CM | POA: Diagnosis present

## 2018-01-29 DIAGNOSIS — M47816 Spondylosis without myelopathy or radiculopathy, lumbar region: Secondary | ICD-10-CM

## 2018-01-29 DIAGNOSIS — M545 Low back pain, unspecified: Secondary | ICD-10-CM

## 2018-01-29 DIAGNOSIS — G8929 Other chronic pain: Secondary | ICD-10-CM | POA: Diagnosis not present

## 2018-01-29 DIAGNOSIS — Z885 Allergy status to narcotic agent status: Secondary | ICD-10-CM | POA: Diagnosis not present

## 2018-01-29 DIAGNOSIS — R1031 Right lower quadrant pain: Secondary | ICD-10-CM

## 2018-01-29 DIAGNOSIS — Z880 Allergy status to penicillin: Secondary | ICD-10-CM | POA: Insufficient documentation

## 2018-01-29 DIAGNOSIS — M8938 Hypertrophy of bone, other site: Secondary | ICD-10-CM | POA: Diagnosis not present

## 2018-01-29 DIAGNOSIS — Z7901 Long term (current) use of anticoagulants: Secondary | ICD-10-CM

## 2018-01-29 MED ORDER — ORPHENADRINE CITRATE 30 MG/ML IJ SOLN
INTRAMUSCULAR | Status: AC
  Filled 2018-01-29: qty 2

## 2018-01-29 MED ORDER — MIDAZOLAM HCL 5 MG/5ML IJ SOLN
1.0000 mg | INTRAMUSCULAR | Status: DC | PRN
  Administered 2018-01-29: 1 mg via INTRAVENOUS
  Filled 2018-01-29: qty 5

## 2018-01-29 MED ORDER — ROPIVACAINE HCL 2 MG/ML IJ SOLN
9.0000 mL | Freq: Once | INTRAMUSCULAR | Status: AC
  Administered 2018-01-29: 9 mL via PERINEURAL
  Filled 2018-01-29: qty 10

## 2018-01-29 MED ORDER — FENTANYL CITRATE (PF) 100 MCG/2ML IJ SOLN
25.0000 ug | INTRAMUSCULAR | Status: DC | PRN
  Administered 2018-01-29: 50 ug via INTRAVENOUS
  Filled 2018-01-29: qty 2

## 2018-01-29 MED ORDER — ORPHENADRINE CITRATE 30 MG/ML IJ SOLN
30.0000 mg | Freq: Once | INTRAMUSCULAR | Status: AC
  Administered 2018-01-29: 30 mg via INTRAMUSCULAR

## 2018-01-29 MED ORDER — KETOROLAC TROMETHAMINE 30 MG/ML IJ SOLN
INTRAMUSCULAR | Status: AC
  Filled 2018-01-29: qty 1

## 2018-01-29 MED ORDER — KETOROLAC TROMETHAMINE 30 MG/ML IJ SOLN
30.0000 mg | Freq: Once | INTRAMUSCULAR | Status: AC
  Administered 2018-01-29: 30 mg via INTRAMUSCULAR

## 2018-01-29 MED ORDER — TRIAMCINOLONE ACETONIDE 40 MG/ML IJ SUSP
40.0000 mg | Freq: Once | INTRAMUSCULAR | Status: AC
  Administered 2018-01-29: 40 mg
  Filled 2018-01-29: qty 1

## 2018-01-29 MED ORDER — LIDOCAINE HCL 2 % IJ SOLN
20.0000 mL | Freq: Once | INTRAMUSCULAR | Status: AC
  Administered 2018-01-29: 400 mg
  Filled 2018-01-29: qty 40

## 2018-01-29 MED ORDER — LACTATED RINGERS IV SOLN
1000.0000 mL | Freq: Once | INTRAVENOUS | Status: AC
  Administered 2018-01-29: 1000 mL via INTRAVENOUS

## 2018-01-29 NOTE — Progress Notes (Signed)
Patient's Name: Destiny [name]  MRN: [id number]  Referring Provider: Derinda Late, MD  DOB: Apr 04, 1924  PCP: Derinda Late, MD  DOS: 01/29/2018  Note by: Gaspar Cola, MD  Service setting: Ambulatory outpatient  Specialty: Interventional Pain Management  Patient type: Established  Location: [facility] (AMB) Pain Management Facility  Visit type: Interventional Procedure   Primary Reason for Visit: Interventional Pain Management Treatment. CC: Back Pain (lower right)  Procedure:       Anesthesia, Analgesia, Anxiolysis:  Type: Lumbar Facet, Medial Branch Block(s) #1  Primary Purpose: Diagnostic Region: Posterolateral Lumbosacral Spine Level: L2, L3, L4, L5, & S1 Medial Branch Level(s). Injecting these levels blocks the L3-4, L4-5, and L5-S1 lumbar facet joints. Laterality: Right  Type: Moderate (Conscious) Sedation combined with Local Anesthesia Indication(s): Analgesia and Anxiety Route: Intravenous (IV) IV Access: Secured Sedation: Meaningful verbal contact was maintained at all times during the procedure  Local Anesthetic: Lidocaine 1-2%   Indications: 1. Spondylosis without myelopathy or radiculopathy, lumbosacral region   2. Lumbar facet syndrome (Bilateral)   3. Lumbar facet hypertrophy   4. Chronic low back pain (Primary Area of Pain) (Bilateral) (L>R)    Pain Score: Pre-procedure: 5 /10 Post-procedure: 4 /10  Pre-op Assessment:  [name] is a 84 y.o. (year old), female patient, seen today for interventional treatment. She  has a past surgical history that includes Joint replacement; Knee surgery (Bilateral); Back surgery; Appendectomy; Tonsilectomy, adenoidectomy, bilateral myringotomy and tubes; and Foot surgery (Bilateral). [name] has a current medication list which includes the following prescription(s): aspirin ec, clindamycin, doxepin, glucose blood, metformin, onetouch delica lancets fine, telmisartan, amlodipine, and fluticasone, and the following  Facility-Administered Medications: fentanyl and midazolam. Her primarily concern today is the Back Pain (lower right)  Initial Vital Signs:  Pulse/HCG Rate: 82ECG Heart Rate: 79 Temp: 97.7 F (36.5 C) Resp: 16 BP: (!) 177/76 SpO2: 100 %  BMI: Estimated body mass index is 22.86 kg/m as calculated from the following:   Height as of this encounter: 5\' 2"  (1.575 m).   Weight as of this encounter: 125 lb (56.7 kg).  Risk Assessment: Allergies: Reviewed. She is allergic to penicillins; ampicillin; hydrocodone-homatropine; amoxicillin; and tramadol.  Allergy Precautions: None required Coagulopathies: Reviewed. None identified.  Blood-thinner therapy: None at this time Active Infection(s): Reviewed. None identified. [name] is afebrile  Site Confirmation: [name] was asked to confirm the procedure and laterality before marking the site Procedure checklist: Completed Consent: Before the procedure and under the influence of no sedative(s), amnesic(s), or anxiolytics, the patient was informed of the treatment options, risks and possible complications. To fulfill our ethical and legal obligations, as recommended by the American Medical Association's Code of Ethics, I have informed the patient of my clinical impression; the nature and purpose of the treatment or procedure; the risks, benefits, and possible complications of the intervention; the alternatives, including doing nothing; the risk(s) and benefit(s) of the alternative treatment(s) or procedure(s); and the risk(s) and benefit(s) of doing nothing. The patient was provided information about the general risks and possible complications associated with the procedure. These may include, but are not limited to: failure to achieve desired goals, infection, bleeding, organ or nerve damage, allergic reactions, paralysis, and death. In addition, the patient was informed of those risks and complications associated to Spine-related procedures, such as  failure to decrease pain; infection (i.e.: Meningitis, epidural or intraspinal abscess); bleeding (i.e.: epidural hematoma, subarachnoid hemorrhage, or any other type of intraspinal or peri-dural bleeding); organ or nerve  damage (i.e.: Any type of peripheral nerve, nerve root, or spinal cord injury) with subsequent damage to sensory, motor, and/or autonomic systems, resulting in permanent pain, numbness, and/or weakness of one or several areas of the body; allergic reactions; (i.e.: anaphylactic reaction); and/or death. Furthermore, the patient was informed of those risks and complications associated with the medications. These include, but are not limited to: allergic reactions (i.e.: anaphylactic or anaphylactoid reaction(s)); adrenal axis suppression; blood sugar elevation that in diabetics may result in ketoacidosis or comma; water retention that in patients with history of congestive heart failure may result in shortness of breath, pulmonary edema, and decompensation with resultant heart failure; weight gain; swelling or edema; medication-induced neural toxicity; particulate matter embolism and blood vessel occlusion with resultant organ, and/or nervous system infarction; and/or aseptic necrosis of one or more joints. Finally, the patient was informed that Medicine is not an exact science; therefore, there is also the possibility of unforeseen or unpredictable risks and/or possible complications that may result in a catastrophic outcome. The patient indicated having understood very clearly. We have given the patient no guarantees and we have made no promises. Enough time was given to the patient to ask questions, all of which were answered to the patient's satisfaction. [name] has indicated that she wanted to continue with the procedure. Attestation: I, the ordering provider, attest that I have discussed with the patient the benefits, risks, side-effects, alternatives, likelihood of achieving goals, and  potential problems during recovery for the procedure that I have provided informed consent. Date  Time: 01/29/2018  8:39 AM  Pre-Procedure Preparation:  Monitoring: As per clinic protocol. Respiration, ETCO2, SpO2, BP, heart rate and rhythm monitor placed and checked for adequate function Safety Precautions: Patient was assessed for positional comfort and pressure points before starting the procedure. Time-out: I initiated and conducted the "Time-out" before starting the procedure, as per protocol. The patient was asked to participate by confirming the accuracy of the "Time Out" information. Verification of the correct person, site, and procedure were performed and confirmed by me, the nursing staff, and the patient. "Time-out" conducted as per Joint Commission's Universal Protocol (UP.01.01.01). Time: 0928  Description of Procedure:       Position: Prone Laterality: Right Levels:  L2, L3, L4, L5, & S1 Medial Branch Level(s) Area Prepped: Posterior Lumbosacral Region Prepping solution: ChloraPrep (2% chlorhexidine gluconate and 70% isopropyl alcohol) Safety Precautions: Aspiration looking for blood return was conducted prior to all injections. At no point did we inject any substances, as a needle was being advanced. Before injecting, the patient was told to immediately notify me if she was experiencing any new onset of "ringing in the ears, or metallic taste in the mouth". No attempts were made at seeking any paresthesias. Safe injection practices and needle disposal techniques used. Medications properly checked for expiration dates. SDV (single dose vial) medications used. After the completion of the procedure, all disposable equipment used was discarded in the proper designated medical waste containers. Local Anesthesia: Protocol guidelines were followed. The patient was positioned over the fluoroscopy table. The area was prepped in the usual manner. The time-out was completed. The target area was  identified using fluoroscopy. A 12-in long, straight, sterile hemostat was used with fluoroscopic guidance to locate the targets for each level blocked. Once located, the skin was marked with an approved surgical skin marker. Once all sites were marked, the skin (epidermis, dermis, and hypodermis), as well as deeper tissues (fat, connective tissue and muscle) were infiltrated with  a small amount of a short-acting local anesthetic, loaded on a 10cc syringe with a 25G, 1.5-in  Needle. An appropriate amount of time was allowed for local anesthetics to take effect before proceeding to the next step. Local Anesthetic: Lidocaine 2.0% The unused portion of the local anesthetic was discarded in the proper designated containers. Technical explanation of process:  L2 Medial Branch Nerve Block (MBB): The target area for the L2 medial branch is at the junction of the postero-lateral aspect of the superior articular process and the superior, posterior, and medial edge of the transverse process of L3. Under fluoroscopic guidance, a Quincke needle was inserted until contact was made with os over the superior postero-lateral aspect of the pedicular shadow (target area). After negative aspiration for blood, 0.5 mL of the nerve block solution was injected without difficulty or complication. The needle was removed intact. L3 Medial Branch Nerve Block (MBB): The target area for the L3 medial branch is at the junction of the postero-lateral aspect of the superior articular process and the superior, posterior, and medial edge of the transverse process of L4. Under fluoroscopic guidance, a Quincke needle was inserted until contact was made with os over the superior postero-lateral aspect of the pedicular shadow (target area). After negative aspiration for blood, 0.5 mL of the nerve block solution was injected without difficulty or complication. The needle was removed intact. L4 Medial Branch Nerve Block (MBB): The target area for the  L4 medial branch is at the junction of the postero-lateral aspect of the superior articular process and the superior, posterior, and medial edge of the transverse process of L5. Under fluoroscopic guidance, a Quincke needle was inserted until contact was made with os over the superior postero-lateral aspect of the pedicular shadow (target area). After negative aspiration for blood, 0.5 mL of the nerve block solution was injected without difficulty or complication. The needle was removed intact. L5 Medial Branch Nerve Block (MBB): The target area for the L5 medial branch is at the junction of the postero-lateral aspect of the superior articular process and the superior, posterior, and medial edge of the sacral ala. Under fluoroscopic guidance, a Quincke needle was inserted until contact was made with os over the superior postero-lateral aspect of the pedicular shadow (target area). After negative aspiration for blood, 0.5 mL of the nerve block solution was injected without difficulty or complication. The needle was removed intact. S1 Medial Branch Nerve Block (MBB): The target area for the S1 medial branch is at the posterior and inferior 6 o'clock position of the L5-S1 facet joint. Under fluoroscopic guidance, the Quincke needle inserted for the L5 MBB was redirected until contact was made with os over the inferior and postero aspect of the sacrum, at the 6 o' clock position under the L5-S1 facet joint (Target area). After negative aspiration for blood, 0.5 mL of the nerve block solution was injected without difficulty or complication. The needle was removed intact. Procedural Needles: 22-gauge, 3.5-inch, Quincke needles used for all levels. Nerve block solution: 0.2% PF-Ropivacaine + Triamcinolone (40 mg/mL) diluted to a final concentration of 4 mg of Triamcinolone/mL of Ropivacaine The unused portion of the solution was discarded in the proper designated containers.  Once the entire procedure was completed,  the treated area was cleaned, making sure to leave some of the prepping solution back to take advantage of its long term bactericidal properties.   Illustration of the posterior view of the lumbar spine and the posterior neural structures. Laminae of L2 through  S1 are labeled. DPRL5, dorsal primary ramus of L5; DPRS1, dorsal primary ramus of S1; DPR3, dorsal primary ramus of L3; FJ, facet (zygapophyseal) joint L3-L4; I, inferior articular process of L4; LB1, lateral branch of dorsal primary ramus of L1; IAB, inferior articular branches from L3 medial branch (supplies L4-L5 facet joint); IBP, intermediate branch plexus; MB3, medial branch of dorsal primary ramus of L3; NR3, third lumbar nerve root; S, superior articular process of L5; SAB, superior articular branches from L4 (supplies L4-5 facet joint also); TP3, transverse process of L3.  Vitals:   01/29/18 0941 01/29/18 0951 01/29/18 0957 01/29/18 1004  BP: (!) 142/68 133/60  (!) 154/79  Pulse:      Resp: 11 16  18   Temp: 97.6 F (36.4 C)     TempSrc:      SpO2: 91% 100% 99% 98%  Weight:      Height:        Start Time: 0928 hrs. End Time: 0931 hrs.  Imaging Guidance (Spinal):  Type of Imaging Technique: Fluoroscopy Guidance (Spinal) Indication(s): Assistance in needle guidance and placement for procedures requiring needle placement in or near specific anatomical locations not easily accessible without such assistance. Exposure Time: Please see nurses notes. Contrast: None used. Fluoroscopic Guidance: I was personally present during the use of fluoroscopy. "Tunnel Vision Technique" used to obtain the best possible view of the target area. Parallax error corrected before commencing the procedure. "Direction-depth-direction" technique used to introduce the needle under continuous pulsed fluoroscopy. Once target was reached, antero-posterior, oblique, and lateral fluoroscopic projection used confirm needle placement in all planes. Images  permanently stored in EMR. Interpretation: No contrast injected. I personally interpreted the imaging intraoperatively. Adequate needle placement confirmed in multiple planes. Permanent images saved into the patient's record.  Antibiotic Prophylaxis:   Anti-infectives (From admission, onward)   None     Indication(s): None identified  Post-operative Assessment:  Post-procedure Vital Signs:  Pulse/HCG Rate: 8275 Temp: 97.6 F (36.4 C) Resp: 18 BP: (!) 154/79 SpO2: 98 %  EBL: None  Complications: No immediate post-treatment complications observed by team, or reported by patient.  The patient indicates experiencing some right groin pain, which she has experienced in the past after SI joint injections.  Initially we thought that this had to do with rupture of the anterior capsule of the right SI joint, however today we have not done the SI joint injections and she is again experiencing this.  This would suggest that it is coming from the area of L1/L2 and may may be associated with patient positioning during the procedure.  According to the patient's recent lumbar MRI, she does have some spinal stenosis at the L2-3 level which has progressed compared to prior MRIs.  However, it does not specifically say anything about right-sided foraminal stenosis at the L1-2 or L2-3 levels.  In any case, similar to all other occasions, we have given the patient an IM injection of Toradol 30 mg + Norflex 30 mg.  Note: The patient tolerated the entire procedure well. A repeat set of vitals were taken after the procedure and the patient was kept under observation following institutional policy, for this type of procedure. Post-procedural neurological assessment was performed, showing return to baseline, prior to discharge. The patient was provided with post-procedure discharge instructions, including a section on how to identify potential problems. Should any problems arise concerning this procedure, the patient  was given instructions to immediately contact us, at any time, without hesitation. In any case, we  plan to contact the patient by telephone for a follow-up status report regarding this interventional procedure.  Comments:  No additional relevant information.  Plan of Care    Imaging Orders     DG C-Arm 1-60 Min-No Report  Procedure Orders     LUMBAR FACET(MEDIAL BRANCH NERVE BLOCK) MBNB  Medications ordered for procedure: Meds ordered this encounter  Medications  . lidocaine (XYLOCAINE) 2 % (with pres) injection 400 mg  . midazolam (VERSED) 5 MG/5ML injection 1-2 mg    Make sure Flumazenil is available in the pyxis when using this medication. If oversedation occurs, administer 0.2 mg IV over 15 sec. If after 45 sec no response, administer 0.2 mg again over 1 min; may repeat at 1 min intervals; not to exceed 4 doses (1 mg)  . fentaNYL (SUBLIMAZE) injection 25-50 mcg    Make sure Narcan is available in the pyxis when using this medication. In the event of respiratory depression (RR< 8/min): Titrate NARCAN (naloxone) in increments of 0.1 to 0.2 mg IV at 2-3 minute intervals, until desired degree of reversal.  . lactated ringers infusion 1,000 mL  . ropivacaine (PF) 2 mg/mL (0.2%) (NAROPIN) injection 9 mL  . triamcinolone acetonide (KENALOG-40) injection 40 mg  . ketorolac (TORADOL) 30 MG/ML injection 30 mg  . orphenadrine (NORFLEX) injection 30 mg   Medications administered: We administered lidocaine, midazolam, fentaNYL, lactated ringers, ropivacaine (PF) 2 mg/mL (0.2%), triamcinolone acetonide, ketorolac, and orphenadrine.  See the medical record for exact dosing, route, and time of administration.  New Prescriptions   No medications on file   Disposition: Discharge home  Discharge Date & Time: 01/29/2018; 1030(toradol 30 mg and norflex 30 mg IM in left and right hip respectively given for c/o right leg pain in the front of thigh) hrs.   Physician-requested Follow-up: Return for  post-procedure eval (2 wks), w/ Dr. Dossie Arbour.  Future Appointments  Date Time Provider [place]  02/18/2018 11:15 AM Milinda Pointer, MD [facility] None   Primary Care Physician: Derinda Late, MD Location: [facility] Note by: Gaspar Cola, MD Date: 01/29/2018; Time: 11:40 AM  Disclaimer:  Medicine is not an exact science. The only guarantee in medicine is that nothing is guaranteed. It is important to note that the decision to proceed with this intervention was based on the information collected from the patient. The Data and conclusions were drawn from the patient's questionnaire, the interview, and the physical examination. Because the information was provided in large part by the patient, it cannot be guaranteed that it has not been purposely or unconsciously manipulated. Every effort has been made to obtain as much relevant data as possible for this evaluation. It is important to note that the conclusions that lead to this procedure are derived in large part from the available data. Always take into account that the treatment will also be dependent on availability of resources and existing treatment guidelines, considered by other Pain Management Practitioners as being common knowledge and practice, at the time of the intervention. For Medico-Legal purposes, it is also important to point out that variation in procedural techniques and pharmacological choices are the acceptable norm. The indications, contraindications, technique, and results of the above procedure should only be interpreted and judged by a Board-Certified Interventional Pain Specialist with extensive familiarity and expertise in the same exact procedure and technique.

## 2018-01-29 NOTE — Patient Instructions (Signed)

## 2018-01-30 ENCOUNTER — Telehealth: Payer: Self-pay

## 2018-01-30 NOTE — Telephone Encounter (Signed)
Post procedure phone call.  Patient states she is doing well.  

## 2018-02-02 ENCOUNTER — Other Ambulatory Visit: Payer: Self-pay

## 2018-02-02 ENCOUNTER — Encounter: Payer: Self-pay | Admitting: Emergency Medicine

## 2018-02-02 DIAGNOSIS — Z8673 Personal history of transient ischemic attack (TIA), and cerebral infarction without residual deficits: Secondary | ICD-10-CM | POA: Diagnosis not present

## 2018-02-02 DIAGNOSIS — Z79899 Other long term (current) drug therapy: Secondary | ICD-10-CM | POA: Diagnosis not present

## 2018-02-02 DIAGNOSIS — I10 Essential (primary) hypertension: Secondary | ICD-10-CM | POA: Diagnosis not present

## 2018-02-02 DIAGNOSIS — E119 Type 2 diabetes mellitus without complications: Secondary | ICD-10-CM | POA: Diagnosis not present

## 2018-02-02 DIAGNOSIS — Z7984 Long term (current) use of oral hypoglycemic drugs: Secondary | ICD-10-CM | POA: Insufficient documentation

## 2018-02-02 DIAGNOSIS — Z7982 Long term (current) use of aspirin: Secondary | ICD-10-CM | POA: Insufficient documentation

## 2018-02-02 DIAGNOSIS — R51 Headache: Secondary | ICD-10-CM | POA: Insufficient documentation

## 2018-02-02 LAB — CBC
HCT: 39.4 % (ref 35.0–47.0)
HEMOGLOBIN: 13.3 g/dL (ref 12.0–16.0)
MCH: 31 pg (ref 26.0–34.0)
MCHC: 33.7 g/dL (ref 32.0–36.0)
MCV: 92 fL (ref 80.0–100.0)
Platelets: 263 10*3/uL (ref 150–440)
RBC: 4.28 MIL/uL (ref 3.80–5.20)
RDW: 14.3 % (ref 11.5–14.5)
WBC: 8.8 10*3/uL (ref 3.6–11.0)

## 2018-02-02 LAB — BASIC METABOLIC PANEL
Anion gap: 6 (ref 5–15)
BUN: 20 mg/dL (ref 6–20)
CHLORIDE: 103 mmol/L (ref 101–111)
CO2: 30 mmol/L (ref 22–32)
Calcium: 9.7 mg/dL (ref 8.9–10.3)
Creatinine, Ser: 0.74 mg/dL (ref 0.44–1.00)
GFR calc Af Amer: >60 mL/min (ref >60)
Glucose, Bld: 160 mg/dL — ABNORMAL HIGH (ref 65–99)
POTASSIUM: 4.2 mmol/L (ref 3.5–5.1)
SODIUM: 139 mmol/L (ref 135–145)

## 2018-02-02 LAB — TROPONIN I

## 2018-02-02 NOTE — ED Triage Notes (Signed)
Pt to triage via w/c with no distress noted; family reports 188/88 BP at home; took extra ds amlodipine 2.5mg  hr PTA; pt c/o HA to top of head

## 2018-02-03 ENCOUNTER — Emergency Department
Admission: EM | Admit: 2018-02-03 | Discharge: 2018-02-03 | Disposition: A | Discharge status: Home / Self Care | Payer: Medicare Other | Attending: Emergency Medicine | Admitting: Emergency Medicine

## 2018-02-03 ENCOUNTER — Emergency Department: Payer: Medicare Other

## 2018-02-03 DIAGNOSIS — I10 Essential (primary) hypertension: Secondary | ICD-10-CM

## 2018-02-03 DIAGNOSIS — R51 Headache: Secondary | ICD-10-CM

## 2018-02-03 DIAGNOSIS — R519 Headache, unspecified: Secondary | ICD-10-CM

## 2018-02-03 MED ORDER — HYDROCHLOROTHIAZIDE 12.5 MG PO TABS: 12.5000 mg | ORAL_TABLET | Freq: Every day | ORAL | 0 refills | Status: DC

## 2018-02-03 MED ORDER — METOCLOPRAMIDE HCL 5 MG/ML IJ SOLN
5.0000 mg | Freq: Once | INTRAMUSCULAR | Status: AC
Start: 2018-02-03 — End: 2018-02-03
  Administered 2018-02-03: 5 mg via INTRAVENOUS
  Filled 2018-02-03: qty 2

## 2018-02-03 MED ORDER — SODIUM CHLORIDE 0.9 % IV BOLUS
500.0000 mL | Freq: Once | INTRAVENOUS | Status: AC
Start: 2018-02-03 — End: 2018-02-03
  Administered 2018-02-03: 500 mL via INTRAVENOUS

## 2018-02-03 MED ORDER — DIPHENHYDRAMINE HCL 50 MG/ML IJ SOLN
12.5000 mg | Freq: Once | INTRAMUSCULAR | Status: AC
  Administered 2018-02-03: 12.5 mg via INTRAVENOUS
  Filled 2018-02-03: qty 1

## 2018-02-03 MED ORDER — KETOROLAC TROMETHAMINE 30 MG/ML IJ SOLN
15.0000 mg | Freq: Once | INTRAMUSCULAR | Status: AC
  Administered 2018-02-03: 15 mg via INTRAVENOUS
  Filled 2018-02-03: qty 1

## 2018-02-03 NOTE — ED Provider Notes (Signed)
[facility] Emergency Department Provider Note   ____________________________________________   First MD Initiated Contact with Patient 02/03/18 [phone]     (approximate)  I have reviewed the triage vital signs and the nursing notes.   HISTORY  Chief Complaint Headache and Hypertension    HPI Destiny [name] is a 84 y.o. female who comes into the hospital today with some high blood pressure and a headache.  The patient states that her blood pressure gave her a terrible headache.  The patient states that her blood pressure was 199/99.  She takes amlodipine 2.5 mg every morning.  The patient's daughter states that when they noticed her blood pressure was high she took another dose and waited.  When her blood pressure did not improve she decided to come into the hospital for evaluation.  The patient states that she thinks her blood pressure was elevated over the weekend.  She reports that she does not typically get a headache when her blood pressure is high.  She was busy this weekend but states that someone reported her face was flushed on Sunday.  Her daughter told her to take her blood pressure but the patient forgot.  When she noticed that it was flushed again today she decided to take it especially given the patient's headache.  The patient took some ibuprofen which did not help.  She had a similar episode 2 years ago and was given some blood pressure medicine but does not recall the name of the medicine.  She was told to take it should her blood pressure get elevated again.  The patient's daughter reports though that she is sensitive and she has had to go down on her medicines because of unstable blood pressures.  The patient denies any chest pain, shortness of breath, blurred vision, weakness, numbness, tingling.  She rates her headache currently a 2 out of 10 in intensity.  Past Medical History:  Diagnosis Date  . Allergy   . Diabetes mellitus without complication  ([place])   . Hyperlipidemia   . Shingles   . Stroke [facility])     Patient Active Problem List   Diagnosis Date Noted  . Spondylosis without myelopathy or radiculopathy, lumbosacral region 01/29/2018  . Trigger point with back pain (buttocks area) (Right) 11/18/2017  . Groin pain (Right) 11/18/2017  . Trochanteric bursitis of hip (Right) 10/20/2017  . Lumbar facet syndrome (Bilateral) 09/18/2017  . Osteoarthritis of sacroiliac joints (Bilateral) 09/17/2017  . Osteoarthritis of hip (Bilateral) 09/17/2017  . Osteoarthritis of lumbar spine 09/17/2017  . Lumbar spondylosis 09/17/2017  . Long term current use of anticoagulant (Plavix) 09/17/2017  . DDD (degenerative disc disease), lumbar 09/17/2017  . Lumbar facet hypertrophy 09/17/2017  . Chronic low back pain (Primary Area of Pain) (Bilateral) (L>R) 09/08/2017  . Chronic sacroiliac joint pain (Bilateral) (L>R) 09/08/2017  . Chronic hip pain (Bilateral) (L>R) 09/08/2017  . Chronic pain syndrome 09/08/2017  . Diabetes mellitus type 2, uncomplicated ([place]) 01/60/1093  . CVA (cerebral vascular accident) ([place]) 07/30/2017  . Chronic myofascial pain 08/01/2016  . Esophageal reflux 03/01/2014  . Benign essential hypertension 03/01/2014  . Migraine 03/01/2014  . Other and unspecified hyperlipidemia 03/01/2014  . Type II or unspecified type diabetes mellitus without mention of complication, not stated as uncontrolled 03/01/2014  . Pseudophakia, both eyes 10/30/2012  . 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 APPENDECTOMY    . BACK SURGERY    . FOOT  SURGERY Bilateral    [place]  . JOINT REPLACEMENT    . KNEE SURGERY Bilateral   . TONSILECTOMY, ADENOIDECTOMY, BILATERAL MYRINGOTOMY AND TUBES      Prior to Admission medications   Medication Sig Start Date End Date Taking? Authorizing Provider  amLODipine (NORVASC) 2.5 MG tablet Take 1 tablet (2.5 mg total) by mouth daily. Take once daily  if blood pressure > 160 10/04/16 02/03/25 Yes Merlyn Lot, MD  aspirin EC 81 MG tablet Take 1 tablet (81 mg total) by mouth daily. 07/30/17 07/30/18 Yes Vaughan Basta, MD  doxepin (SINEQUAN) 10 MG capsule TAKE ONE CAPSULE BY MOUTH EVERY NIGHT AT BEDTIME 07/30/16  Yes [provider]  metFORMIN (GLUCOPHAGE) 500 MG tablet TAKE ONE (1) TABLET BY MOUTH EVERY DAY 07/01/16  Yes [provider]  telmisartan (MICARDIS) 80 MG tablet Take 1 tablet by mouth daily. 07/25/17  Yes [provider]  fluticasone (FLONASE) 50 MCG/ACT nasal spray Place into the nose as needed.  08/31/15 07/30/17  [provider]  glucose blood (ONE TOUCH ULTRA TEST) test strip EVERY DAY 04/15/16   [provider]  hydrochlorothiazide (HYDRODIURIL) 12.5 MG tablet Take 1 tablet (12.5 mg total) by mouth daily. 02/03/18   Loney Hering, MD  [facility] DELICA LANCETS FINE MISC EVERY DAY 04/15/16   [provider]    Allergies Penicillins; Ampicillin; Hydrocodone-homatropine; Amoxicillin; and Tramadol  Family History  Problem Relation Age of Onset  . Stroke Mother   . Stroke Father     Social History Social History   Tobacco Use  . Smoking status: Never Smoker  . Smokeless tobacco: Never Used  Substance Use Topics  . Alcohol use: No  . Drug use: No    Review of Systems  Constitutional: No fever/chills Eyes: No visual changes. ENT: No sore throat. Cardiovascular: Denies chest pain. Respiratory: Denies shortness of breath. Gastrointestinal: No abdominal pain.   Genitourinary: Negative for dysuria. Musculoskeletal: Negative for back pain. Skin: Negative for rash. Neurological: Headache   ____________________________________________   PHYSICAL EXAM:  VITAL SIGNS: ED Triage Vitals  Enc Vitals Group     BP 02/02/18 2328 (!) 194/78     Pulse Rate 02/02/18 2328 82     Resp 02/02/18 2328 18     Temp 02/02/18 2328 98.1 F (36.7 C)     Temp Source  02/02/18 2328 Oral     SpO2 02/02/18 2328 98 %     Weight 02/02/18 2329 125 lb (56.7 kg)     Height 02/02/18 2329 5\' 2"  (1.575 m)     Head Circumference --      Peak Flow --      Pain Score 02/02/18 2329 10     Pain Loc --      Pain Edu? --      Excl. in [place]? --     Constitutional: Alert and oriented. Well appearing and in normal distress. Eyes: Conjunctivae are normal. PERRL. EOMI. Head: Atraumatic. Nose: No congestion/rhinnorhea. Mouth/Throat: Mucous membranes are moist.  Oropharynx non-erythematous. Cardiovascular: Normal rate, regular rhythm. Grossly normal heart sounds.  Good peripheral circulation. Respiratory: Normal respiratory effort.  No retractions. Lungs CTAB. Gastrointestinal: Soft and nontender. No distention. Positive bowel sounds Musculoskeletal: No lower extremity tenderness nor edema.   Neurologic:  Normal speech and language. Cranial nerves II-XII grossly intact with no focal, motor or neuro deficit Skin:  Skin is warm, dry and intact.  Psychiatric: Mood and affect are normal.   ____________________________________________   LABS (all  labs ordered are listed, but only abnormal results are displayed)  Labs Reviewed  BASIC METABOLIC PANEL - Abnormal; Notable for the following components:      Result Value   Glucose, Bld 160 (*)    All other components within normal limits  CBC  TROPONIN I   ____________________________________________  EKG  ED ECG REPORT I, Loney Hering, the attending physician, personally viewed and interpreted this ECG.   Date: 02/02/2018  EKG Time: 2332  Rate: 79  Rhythm: normal sinus rhythm  Axis: normal  Intervals:none  ST&T Change: none  ____________________________________________  RADIOLOGY  ED MD interpretation:  CT head: No acute intracranial pathology seen on CT, Moderate cortical volume loss and scattered small vessel ischemic microangiopathy, chronic infarct at the high left posterior parietal lobe with  associated encephalomalacia, chronic lacunar infarcts at the basal ganglia bilaterally.  Official radiology report(s): Ct Head Wo Contrast  Result Date: 02/03/2018 CLINICAL DATA:  Acute onset of high blood pressure and headache at the vertex. EXAM: CT HEAD WITHOUT CONTRAST TECHNIQUE: Contiguous axial images were obtained from the base of the skull through the vertex without intravenous contrast. COMPARISON:  CT of the head performed 07/29/2017, and MRI of the brain performed 07/30/2017 FINDINGS: Brain: No evidence of acute infarction, hemorrhage, hydrocephalus, extra-axial collection or mass lesion / mass effect. Prominence of the ventricles and sulci reflects moderate cortical volume loss. Mild cerebellar atrophy is noted. Chronic lacunar infarcts are seen at the basal ganglia bilaterally. Mild periventricular Quinonez matter change likely reflects small vessel ischemic microangiopathy. A chronic infarct is noted at the high left posterior parietal lobe, with associated encephalomalacia. The brainstem and fourth ventricle are within normal limits. No mass effect or midline shift is seen. Vascular: No hyperdense vessel or unexpected calcification. Skull: There is no evidence of fracture; visualized osseous structures are unremarkable in appearance. Sinuses/Orbits: The orbits are within normal limits. The paranasal sinuses and mastoid air cells are well-aerated. Other: No significant soft tissue abnormalities are seen. IMPRESSION: 1. No acute intracranial pathology seen on CT. 2. Moderate cortical volume loss and scattered small vessel ischemic microangiopathy. 3. Chronic infarct at the high left posterior parietal lobe, with associated encephalomalacia. Chronic lacunar infarcts at the basal ganglia bilaterally. Electronically Signed   By: Garald Balding M.D.   On: 02/03/2018 05:26    ____________________________________________   PROCEDURES  Procedure(s) performed: None  Procedures  Critical Care  performed: No  ____________________________________________   INITIAL IMPRESSION / ASSESSMENT AND PLAN / ED COURSE  As part of my medical decision making, I reviewed the following data within the electronic MEDICAL RECORD NUMBER Notes from prior ED visits and [place] Controlled Substance Database   This is a 84 year old female who comes into the hospital today with hypertension and a headache.  The patient's initial blood pressure was 194/78  My differential diagnosis includes hypertensive urgency, intracranial hemorrhage, CVA  The patient does not have any weakness or neurologic deficit on exam.  I did send the patient for a CT scan looking at her head and her CT is unremarkable.  The patient's blood pressure did improve to the 150s and 160s while here.  The patient's headache also did improve with the improvement in her blood pressure.  I did give the patient a dose of Reglan, Benadryl and Toradol.  After receiving the medication the patient states that her headache is completely gone.  I did discuss with the patient starting her on a small dose of hydrochlorothiazide.  Upon reviewing  her notes it appears that she may have been on hydrochlorothiazide a few years ago when she had her previous hypertensive headache.  I encouraged the patient to follow-up with her primary care physician for further management of her blood pressure medicines.  The patient feels well.  The patient also had a CBC, BMP and a troponin ordered which was unremarkable.  She will be discharged home.      ____________________________________________   FINAL CLINICAL IMPRESSION(S) / ED DIAGNOSES  Final diagnoses:  Acute nonintractable headache, unspecified headache type  Hypertension, unspecified type     ED Discharge Orders        Ordered    hydrochlorothiazide (HYDRODIURIL) 12.5 MG tablet  Daily     02/03/18 6203       Note:  This document was prepared using Dragon voice recognition software and may include  unintentional dictation errors.    Loney Hering, MD 02/03/18 [phone]

## 2018-02-03 NOTE — ED Notes (Signed)
Signature pad in room not working. Paper copy of discharge signature placed in chart. Patient ambulatory to lobby with steady gait. Verbalized understanding of discharge instructions, prescriptions and follow-up care.

## 2018-02-03 NOTE — Discharge Instructions (Addendum)
Please follow up with your primary care physician. Please monitor your blood pressure while you are taking the new medication

## 2018-02-18 ENCOUNTER — Encounter: Payer: Self-pay | Admitting: Pain Medicine

## 2018-02-18 ENCOUNTER — Ambulatory Visit: Payer: Medicare Other | Attending: Pain Medicine | Admitting: Pain Medicine

## 2018-02-18 ENCOUNTER — Other Ambulatory Visit: Payer: Self-pay

## 2018-02-18 VITALS — BP 129/76 | HR 78 | Temp 97.8°F | Ht 60.0 in | Wt 122.0 lb

## 2018-02-18 DIAGNOSIS — Z7984 Long term (current) use of oral hypoglycemic drugs: Secondary | ICD-10-CM | POA: Insufficient documentation

## 2018-02-18 DIAGNOSIS — Z7982 Long term (current) use of aspirin: Secondary | ICD-10-CM | POA: Insufficient documentation

## 2018-02-18 DIAGNOSIS — G8929 Other chronic pain: Secondary | ICD-10-CM | POA: Diagnosis not present

## 2018-02-18 DIAGNOSIS — Z888 Allergy status to other drugs, medicaments and biological substances status: Secondary | ICD-10-CM | POA: Insufficient documentation

## 2018-02-18 DIAGNOSIS — Z885 Allergy status to narcotic agent status: Secondary | ICD-10-CM | POA: Insufficient documentation

## 2018-02-18 DIAGNOSIS — I10 Essential (primary) hypertension: Secondary | ICD-10-CM | POA: Diagnosis not present

## 2018-02-18 DIAGNOSIS — M533 Sacrococcygeal disorders, not elsewhere classified: Secondary | ICD-10-CM | POA: Insufficient documentation

## 2018-02-18 DIAGNOSIS — M47816 Spondylosis without myelopathy or radiculopathy, lumbar region: Secondary | ICD-10-CM | POA: Diagnosis not present

## 2018-02-18 DIAGNOSIS — Z79899 Other long term (current) drug therapy: Secondary | ICD-10-CM | POA: Diagnosis not present

## 2018-02-18 DIAGNOSIS — Z7902 Long term (current) use of antithrombotics/antiplatelets: Secondary | ICD-10-CM | POA: Insufficient documentation

## 2018-02-18 DIAGNOSIS — M545 Low back pain, unspecified: Secondary | ICD-10-CM

## 2018-02-18 DIAGNOSIS — E785 Hyperlipidemia, unspecified: Secondary | ICD-10-CM | POA: Diagnosis not present

## 2018-02-18 DIAGNOSIS — Z8673 Personal history of transient ischemic attack (TIA), and cerebral infarction without residual deficits: Secondary | ICD-10-CM | POA: Diagnosis not present

## 2018-02-18 DIAGNOSIS — E119 Type 2 diabetes mellitus without complications: Secondary | ICD-10-CM | POA: Insufficient documentation

## 2018-02-18 DIAGNOSIS — G894 Chronic pain syndrome: Secondary | ICD-10-CM | POA: Insufficient documentation

## 2018-02-18 DIAGNOSIS — K219 Gastro-esophageal reflux disease without esophagitis: Secondary | ICD-10-CM | POA: Diagnosis not present

## 2018-02-18 DIAGNOSIS — M47817 Spondylosis without myelopathy or radiculopathy, lumbosacral region: Secondary | ICD-10-CM | POA: Insufficient documentation

## 2018-02-18 DIAGNOSIS — Z880 Allergy status to penicillin: Secondary | ICD-10-CM | POA: Diagnosis not present

## 2018-02-18 NOTE — Progress Notes (Addendum)
Patient's Name: Destiny [name]  MRN: [id number]  Referring Provider: Derinda Late, MD  DOB: 04-23-1924  PCP: Derinda Late, MD  DOS: 02/18/2018  Note by: Gaspar Cola, MD  Service setting: Ambulatory outpatient  Specialty: Interventional Pain Management  Location: [facility] (AMB) Pain Management Facility    Patient type: Established   Primary Reason(s) for Visit: Encounter for post-procedure evaluation of chronic illness with mild to moderate exacerbation CC: Back Pain (no pain )  HPI  Destiny [name] is a 84 y.o. year old, female patient, who comes today for a post-procedure evaluation. She has Chronic myofascial pain; Esophageal reflux; Benign essential hypertension; Migraine; Other and unspecified hyperlipidemia; Pseudophakia, both eyes; Salzmann's nodular dystrophy; Type II or unspecified type diabetes mellitus without mention of complication, not stated as uncontrolled; CVA (cerebral vascular accident) ([place]); Diabetes mellitus type 2, uncomplicated ([place]); Chronic low back pain (Primary Area of Pain) (Bilateral) (L>R); Chronic sacroiliac joint pain (Bilateral) (L>R); Chronic hip pain (Bilateral) (L>R); Chronic pain syndrome; Osteoarthritis of sacroiliac joints (Bilateral); Osteoarthritis of hip (Bilateral); Osteoarthritis of lumbar spine; Lumbar spondylosis; Long term current use of anticoagulant (Plavix); DDD (degenerative disc disease), lumbar; Lumbar facet hypertrophy; Lumbar facet syndrome (Bilateral); Trochanteric bursitis of hip (Right); Trigger point with back pain (buttocks area) (Right); Groin pain (Right); and Spondylosis without myelopathy or radiculopathy, lumbosacral region on their problem list. Her primarily concern today is the Back Pain (no pain )  Pain Assessment: Location: Lower Back Radiating: Denies Onset: More than a month ago Duration: Chronic pain Quality: Other (Comment)(no pain ) Severity: 0-No pain/10 (subjective, self-reported pain score)  Note: Reported level is  compatible with observation.                         When using our objective Pain Scale, levels between 6 and 10/10 are said to belong in an emergency room, as it progressively worsens from a 6/10, described as severely limiting, requiring emergency care not usually available at an outpatient pain management facility. At a 6/10 level, communication becomes difficult and requires great effort. Assistance to reach the emergency department may be required. Facial flushing and profuse sweating along with potentially dangerous increases in heart rate and blood pressure will be evident. Effect on ADL:   Timing: Constant Modifying factors: Denies BP: 129/76  HR: 78  Destiny [name] comes in today for post-procedure evaluation after the treatment done on 01/29/2018.  Further details on both, my assessment(s), as well as the proposed treatment plan, please see below.  Post-Procedure Assessment  01/29/2018 Procedure: Diagnostic right-sided lumbar facet block #1 under fluoroscopic guidance and IV sedation Pre-procedure pain score:  5/10 Post-procedure pain score: 4/10 (< 50% relief) Influential Factors: BMI: 23.83 kg/m Intra-procedural challenges: None observed.         Assessment challenges: Results reported today are inconsistent with those reported on procedure day.              Reported side-effects: None.        Post-procedural adverse reactions or complications: None reported         Sedation: Sedation provided. When no sedatives are used, the analgesic levels obtained are directly associated to the effectiveness of the local anesthetics. However, when sedation is provided, the level of analgesia obtained during the initial 1 hour following the intervention, is believed to be the result of a combination of factors. These factors may include, but are not limited to: 1. The effectiveness of the local anesthetics used. 2.  The effects of the analgesic(s) and/or anxiolytic(s) used. 3. The degree of  discomfort experienced by the patient at the time of the procedure. 4. The patients ability and reliability in recalling and recording the events. 5. The presence and influence of possible secondary gains and/or psychosocial factors. Reported result: Relief experienced during the 1st hour after the procedure: 100 % (Ultra-Short Term Relief) Destiny [name] has indicated area to have been numb during this time. Interpretative annotation: Clinically appropriate result. Analgesia during this period is likely to be Local Anesthetic and/or IV Sedative (Analgesic/Anxiolytic) related.          Effects of local anesthetic: The analgesic effects attained during this period are directly associated to the localized infiltration of local anesthetics and therefore cary significant diagnostic value as to the etiological location, or anatomical origin, of the pain. Expected duration of relief is directly dependent on the pharmacodynamics of the local anesthetic used. Long-acting (4-6 hours) anesthetics used.  Reported result: Relief during the next 4 to 6 hour after the procedure: 100 % (Short-Term Relief) Destiny [name] has indicated area to have been numb during this time. Interpretative annotation: Clinically appropriate result. Analgesia during this period is likely to be Local Anesthetic-related.          Long-term benefit: Defined as the period of time past the expected duration of local anesthetics (1 hour for short-acting and 4-6 hours for long-acting). With the possible exception of prolonged sympathetic blockade from the local anesthetics, benefits during this period are typically attributed to, or associated with, other factors such as analgesic sensory neuropraxia, antiinflammatory effects, or beneficial biochemical changes provided by agents other than the local anesthetics.  Reported result: Extended relief following procedure: 100 % (Long-Term Relief) Destiny [name] reports the axial pain improved more than the extremity  pain. Interpretative annotation: Clinically appropriate result. Good relief. Therapeutic success. Inflammation plays a part in the etiology to the pain.          Current benefits: Defined as reported results that persistent at this point in time.   Analgesia: 75-100 % Ms. Bailon reports improvement of axial symptoms. Function: Ms. Ferch reports improvement in function ROM: Ms. Parrella reports improvement in ROM Interpretative annotation: Ongoing benefit. Therapeutic benefit observed. Effective therapeutic approach.          Interpretation: Results would suggest a successful diagnostic intervention. We'll proceed with diagnostic intervention #2, as soon as convenient          Plan:  Consider diagnostic procedure No.: 2          Laboratory Chemistry  Renal Function Markers Lab Results  Component Value Date   BUN 20 02/02/2018   CREATININE 0.74 02/02/2018   GFRAA >60 02/02/2018   GFRNONAA >60 02/02/2018                              Hepatic Function Markers Lab Results  Component Value Date   AST 23 02/09/2013   ALT 13 02/09/2013   ALBUMIN 3.9 02/09/2013   ALKPHOS 87 02/09/2013                        Electrolytes Lab Results  Component Value Date   NA 139 02/02/2018   K 4.2 02/02/2018   CL 103 02/02/2018   CALCIUM 9.7 02/02/2018  Neuropathy Markers Lab Results  Component Value Date   HGBA1C 6.7 (H) 07/30/2017                        Coagulation Parameters Lab Results  Component Value Date   INR 0.94 07/30/2017   LABPROT 12.5 07/30/2017   APTT 34 07/30/2017   PLT 263 02/02/2018                        Cardiovascular Markers Lab Results  Component Value Date   TROPONINI <0.03 02/02/2018   HGB 13.3 02/02/2018   HCT 39.4 02/02/2018                         Note: Lab results reviewed.  Recent Diagnostic Imaging Results  CT Head Wo Contrast CLINICAL DATA:  Acute onset of high blood pressure and headache at the vertex.  EXAM: CT HEAD  WITHOUT CONTRAST  TECHNIQUE: Contiguous axial images were obtained from the base of the skull through the vertex without intravenous contrast.  COMPARISON:  CT of the head performed 07/29/2017, and MRI of the brain performed 07/30/2017  FINDINGS: Brain: No evidence of acute infarction, hemorrhage, hydrocephalus, extra-axial collection or mass lesion / mass effect.  Prominence of the ventricles and sulci reflects moderate cortical volume loss. Mild cerebellar atrophy is noted. Chronic lacunar infarcts are seen at the basal ganglia bilaterally. Mild periventricular Galik matter change likely reflects small vessel ischemic microangiopathy.  A chronic infarct is noted at the high left posterior parietal lobe, with associated encephalomalacia.  The brainstem and fourth ventricle are within normal limits. No mass effect or midline shift is seen.  Vascular: No hyperdense vessel or unexpected calcification.  Skull: There is no evidence of fracture; visualized osseous structures are unremarkable in appearance.  Sinuses/Orbits: The orbits are within normal limits. The paranasal sinuses and mastoid air cells are well-aerated.  Other: No significant soft tissue abnormalities are seen.  IMPRESSION: 1. No acute intracranial pathology seen on CT. 2. Moderate cortical volume loss and scattered small vessel ischemic microangiopathy. 3. Chronic infarct at the high left posterior parietal lobe, with associated encephalomalacia. Chronic lacunar infarcts at the basal ganglia bilaterally.  Electronically Signed   By: Garald Balding M.D.   On: 02/03/2018 05:26  Complexity Note: I personally reviewed the fluoroscopic imaging of the procedure.                        Meds   Current Outpatient Medications:  .  amLODipine (NORVASC) 2.5 MG tablet, Take 1 tablet (2.5 mg total) by mouth daily. Take once daily if blood pressure > 160, Disp: 14 tablet, Rfl: 0 .  aspirin EC 81 MG tablet, Take 1  tablet (81 mg total) by mouth daily., Disp: 100 tablet, Rfl: 0 .  doxepin (SINEQUAN) 10 MG capsule, TAKE ONE CAPSULE BY MOUTH EVERY NIGHT AT BEDTIME, Disp: , Rfl:  .  glucose blood (ONE TOUCH ULTRA TEST) test strip, EVERY DAY, Disp: , Rfl:  .  hydrochlorothiazide (HYDRODIURIL) 12.5 MG tablet, Take 1 tablet (12.5 mg total) by mouth daily., Disp: 7 tablet, Rfl: 0 .  metFORMIN (GLUCOPHAGE) 500 MG tablet, TAKE ONE (1) TABLET BY MOUTH EVERY DAY, Disp: , Rfl:  .  ONETOUCH DELICA LANCETS FINE MISC, EVERY DAY, Disp: , Rfl:  .  telmisartan (MICARDIS) 80 MG tablet, Take 1 tablet by mouth daily., Disp: , Rfl:  .  fluticasone (FLONASE) 50 MCG/ACT nasal spray, Place into the nose as needed. , Disp: , Rfl:   ROS  Constitutional: Denies any fever or chills Gastrointestinal: No reported hemesis, hematochezia, vomiting, or acute GI distress Musculoskeletal: Denies any acute onset joint swelling, redness, loss of ROM, or weakness Neurological: No reported episodes of acute onset apraxia, aphasia, dysarthria, agnosia, amnesia, paralysis, loss of coordination, or loss of consciousness  Allergies  Ms. Haapala is allergic to penicillins; ampicillin; hydrocodone-homatropine; amoxicillin; and tramadol.  PFSH  Drug: Ms. Marcos  reports that she does not use drugs. Alcohol:  reports that she does not drink alcohol. Tobacco:  reports that she has never smoked. She has never used smokeless tobacco. Medical:  has a past medical history of Allergy, Diabetes mellitus without complication ([place]), Hyperlipidemia, Shingles, and Stroke ([place]). Surgical: Ms. Osgood  has a past surgical history that includes Joint replacement; Knee surgery (Bilateral); Back surgery; Appendectomy; Tonsilectomy, adenoidectomy, bilateral myringotomy and tubes; and Foot surgery (Bilateral). Family: family history includes Stroke in her father and mother.  Constitutional Exam  General appearance: Well nourished, well developed, and well hydrated. In no  apparent acute distress Vitals:   02/18/18 1147  BP: 129/76  Pulse: 78  Temp: 97.8 F (36.6 C)  SpO2: 100%  Weight: 122 lb (55.3 kg)  Height: 5' (1.524 m)   BMI Assessment: Estimated body mass index is 23.83 kg/m as calculated from the following:   Height as of this encounter: 5' (1.524 m).   Weight as of this encounter: 122 lb (55.3 kg).  BMI interpretation table: BMI level Category Range association with higher incidence of chronic pain  <18 kg/m2 Underweight   18.5-24.9 kg/m2 Ideal body weight   25-29.9 kg/m2 Overweight Increased incidence by 20%  30-34.9 kg/m2 Obese (Class I) Increased incidence by 68%  35-39.9 kg/m2 Severe obesity (Class II) Increased incidence by 136%  >40 kg/m2 Extreme obesity (Class III) Increased incidence by 254%   Patient's current BMI Ideal Body weight  Body mass index is 23.83 kg/m. Ideal body weight: 45.5 kg (100 lb 4.9 oz) Adjusted ideal body weight: 49.4 kg (108 lb 15.8 oz)   BMI Readings from Last 4 Encounters:  02/18/18 23.83 kg/m  02/02/18 22.86 kg/m  01/29/18 22.86 kg/m  01/22/18 24.41 kg/m   Wt Readings from Last 4 Encounters:  02/18/18 122 lb (55.3 kg)  02/02/18 125 lb (56.7 kg)  01/29/18 125 lb (56.7 kg)  01/22/18 125 lb (56.7 kg)  Psych/Mental status: Alert, oriented x 3 (person, place, & time)       Eyes: PERLA Respiratory: No evidence of acute respiratory distress  Cervical Spine Area Exam  Skin & Axial Inspection: No masses, redness, edema, swelling, or associated skin lesions Alignment: Symmetrical Functional ROM: Unrestricted ROM      Stability: No instability detected Muscle Tone/Strength: Functionally intact. No obvious neuro-muscular anomalies detected. Sensory (Neurological): Unimpaired Palpation: No palpable anomalies              Upper Extremity (UE) Exam    Side: Right upper extremity  Side: Left upper extremity  Skin & Extremity Inspection: Skin color, temperature, and hair growth are WNL. No peripheral  edema or cyanosis. No masses, redness, swelling, asymmetry, or associated skin lesions. No contractures.  Skin & Extremity Inspection: Skin color, temperature, and hair growth are WNL. No peripheral edema or cyanosis. No masses, redness, swelling, asymmetry, or associated skin lesions. No contractures.  Functional ROM: Unrestricted ROM  Functional ROM: Unrestricted ROM          Muscle Tone/Strength: Functionally intact. No obvious neuro-muscular anomalies detected.  Muscle Tone/Strength: Functionally intact. No obvious neuro-muscular anomalies detected.  Sensory (Neurological): Unimpaired          Sensory (Neurological): Unimpaired          Palpation: No palpable anomalies              Palpation: No palpable anomalies              Specialized Test(s): Deferred         Specialized Test(s): Deferred          Thoracic Spine Area Exam  Skin & Axial Inspection: No masses, redness, or swelling Alignment: Symmetrical Functional ROM: Unrestricted ROM Stability: No instability detected Muscle Tone/Strength: Functionally intact. No obvious neuro-muscular anomalies detected. Sensory (Neurological): Unimpaired Muscle strength & Tone: No palpable anomalies  Lumbar Spine Area Exam  Skin & Axial Inspection: No masses, redness, or swelling Alignment: Symmetrical Functional ROM: Improved after treatment       Stability: No instability detected Muscle Tone/Strength: Functionally intact. No obvious neuro-muscular anomalies detected. Sensory (Neurological): Movement-associated discomfort Palpation: Complains of area being tender to palpation       Provocative Tests: Lumbar Hyperextension and rotation test: Improved after treatment       Lumbar Lateral bending test: evaluation deferred today       Patrick's Maneuver: evaluation deferred today                    Gait & Posture Assessment  Ambulation: Unassisted Gait: Relatively normal for age and body habitus Posture: WNL   Lower Extremity Exam     Side: Right lower extremity  Side: Left lower extremity  Stability: No instability observed          Stability: No instability observed          Skin & Extremity Inspection: Skin color, temperature, and hair growth are WNL. No peripheral edema or cyanosis. No masses, redness, swelling, asymmetry, or associated skin lesions. No contractures.  Skin & Extremity Inspection: Skin color, temperature, and hair growth are WNL. No peripheral edema or cyanosis. No masses, redness, swelling, asymmetry, or associated skin lesions. No contractures.  Functional ROM: Unrestricted ROM                  Functional ROM: Unrestricted ROM                  Muscle Tone/Strength: Functionally intact. No obvious neuro-muscular anomalies detected.  Muscle Tone/Strength: Functionally intact. No obvious neuro-muscular anomalies detected.  Sensory (Neurological): Unimpaired  Sensory (Neurological): Unimpaired  Palpation: No palpable anomalies  Palpation: No palpable anomalies   Assessment  Primary Diagnosis & Pertinent Problem List: The primary encounter diagnosis was Spondylosis without myelopathy or radiculopathy, lumbosacral region. Diagnoses of Lumbar facet syndrome (Bilateral) and Chronic low back pain (Primary Area of Pain) (Bilateral) (L>R) were also pertinent to this visit.  Status Diagnosis  Improving Improving Improved 1. Spondylosis without myelopathy or radiculopathy, lumbosacral region   2. Lumbar facet syndrome (Bilateral)   3. Chronic low back pain (Primary Area of Pain) (Bilateral) (L>R)     Problems updated and reviewed during this visit: No problems updated. Plan of Care  Pharmacotherapy (Medications Ordered): No orders of the defined types were placed in this encounter.  Medications administered today: Shantina T. Runquist had no medications administered during this visit.   Procedure Orders  LUMBAR FACET(MEDIAL BRANCH NERVE BLOCK) [facility] Lab Orders  No laboratory test(s) ordered today    Imaging Orders  No imaging studies ordered today   Referral Orders  No referral(s) requested today   Interventional management options: Planned, scheduled, and/or pending:   NOTE: PLAVIX ANTICOAGULATION (Stop 7-10 days before procedures) Diagnostic right lumbar facet block#2 under fluoroscopic guidance and IV sedation (PRN)   Considering:   Diagnostic bilateral lumbar facet block Possible bilateral lumbar facet RFA Diagnostic bilateral sacroiliac joint block Possible bilateral sacroiliac joint RFA Diagnostic bilateral intra-articular hip joint injection Diagnostic bilateral femoral nerve block + obturator nerve block Possible bilateral femoral nerve + obturator nerve RFA Diagnostic right-sided trochanteric bursa injection   Palliative PRN treatment(s):   Palliative right-sided sacroiliac joint blockunder fluoroscopic guidance, no sedation.   Provider-requested follow-up: Return for PRN Procedure: (L) L-FCT BLK #2, (Blood-thinner Protocol).  No future appointments. Primary Care Physician: Derinda Late, MD Location: [facility] Outpatient Pain Management Facility Note by: Gaspar Cola, MD Date: 02/18/2018; Time: 12:26 PM

## 2018-02-18 NOTE — Patient Instructions (Signed)
____________________________________________________________________________________________  Preparing for Procedure with Sedation  Instructions: . Oral Intake: Do not eat or drink anything for at least 8 hours prior to your procedure. . Transportation: Public transportation is not allowed. Bring an adult driver. The driver must be physically present in our waiting room before any procedure can be started. . Physical Assistance: Bring an adult physically capable of assisting you, in the event you need help. This adult should keep you company at home for at least 6 hours after the procedure. . Blood Pressure Medicine: Take your blood pressure medicine with a sip of water the morning of the procedure. . Blood thinners:  . Diabetics on insulin: Notify the staff so that you can be scheduled 1st case in the morning. If your diabetes requires high dose insulin, take only  of your normal insulin dose the morning of the procedure and notify the staff that you have done so. . Preventing infections: Shower with an antibacterial soap the morning of your procedure. . Build-up your immune system: Take 1000 mg of Vitamin C with every meal (3 times a day) the day prior to your procedure. . Antibiotics: Inform the staff if you have a condition or reason that requires you to take antibiotics before dental procedures. . Pregnancy: If you are pregnant, call and cancel the procedure. . Sickness: If you have a cold, fever, or any active infections, call and cancel the procedure. . Arrival: You must be in the facility at least 30 minutes prior to your scheduled procedure. . Children: Do not bring children with you. . Dress appropriately: Bring dark clothing that you would not mind if they get stained. . Valuables: Do not bring any jewelry or valuables.  Procedure appointments are reserved for interventional treatments only. . No Prescription Refills. . No medication changes will be discussed during procedure  appointments. . No disability issues will be discussed.  Remember:  Regular Business hours are:  Monday to Thursday 8:00 AM to 4:00 PM  Provider's Schedule: Charyl Minervini, MD:  Procedure days: Tuesday and Thursday 7:30 AM to 4:00 PM  Bilal Lateef, MD:  Procedure days: Monday and Wednesday 7:30 AM to 4:00 PM ____________________________________________________________________________________________    

## 2018-03-02 ENCOUNTER — Encounter: Payer: Self-pay | Admitting: Emergency Medicine

## 2018-03-02 ENCOUNTER — Inpatient Hospital Stay
Admission: EM | Admit: 2018-03-02 | Discharge: 2018-03-07 | DRG: 375 | Disposition: A | Discharge status: Home / Self Care | Payer: Medicare Other | Attending: Internal Medicine | Admitting: Internal Medicine

## 2018-03-02 ENCOUNTER — Other Ambulatory Visit: Payer: Self-pay

## 2018-03-02 DIAGNOSIS — K635 Polyp of colon: Secondary | ICD-10-CM

## 2018-03-02 DIAGNOSIS — Z880 Allergy status to penicillin: Secondary | ICD-10-CM

## 2018-03-02 DIAGNOSIS — Z9049 Acquired absence of other specified parts of digestive tract: Secondary | ICD-10-CM

## 2018-03-02 DIAGNOSIS — K922 Gastrointestinal hemorrhage, unspecified: Secondary | ICD-10-CM | POA: Diagnosis not present

## 2018-03-02 DIAGNOSIS — Z9089 Acquired absence of other organs: Secondary | ICD-10-CM

## 2018-03-02 DIAGNOSIS — K5730 Diverticulosis of large intestine without perforation or abscess without bleeding: Secondary | ICD-10-CM

## 2018-03-02 DIAGNOSIS — K648 Other hemorrhoids: Secondary | ICD-10-CM

## 2018-03-02 DIAGNOSIS — Z823 Family history of stroke: Secondary | ICD-10-CM

## 2018-03-02 DIAGNOSIS — C20 Malignant neoplasm of rectum: Secondary | ICD-10-CM | POA: Diagnosis not present

## 2018-03-02 DIAGNOSIS — K6389 Other specified diseases of intestine: Secondary | ICD-10-CM

## 2018-03-02 DIAGNOSIS — K6289 Other specified diseases of anus and rectum: Secondary | ICD-10-CM

## 2018-03-02 DIAGNOSIS — Z7982 Long term (current) use of aspirin: Secondary | ICD-10-CM

## 2018-03-02 DIAGNOSIS — E785 Hyperlipidemia, unspecified: Secondary | ICD-10-CM | POA: Diagnosis present

## 2018-03-02 DIAGNOSIS — Z79899 Other long term (current) drug therapy: Secondary | ICD-10-CM

## 2018-03-02 DIAGNOSIS — D490 Neoplasm of unspecified behavior of digestive system: Secondary | ICD-10-CM

## 2018-03-02 DIAGNOSIS — K921 Melena: Secondary | ICD-10-CM

## 2018-03-02 DIAGNOSIS — E119 Type 2 diabetes mellitus without complications: Secondary | ICD-10-CM | POA: Diagnosis present

## 2018-03-02 DIAGNOSIS — D125 Benign neoplasm of sigmoid colon: Secondary | ICD-10-CM | POA: Diagnosis present

## 2018-03-02 DIAGNOSIS — Z881 Allergy status to other antibiotic agents status: Secondary | ICD-10-CM

## 2018-03-02 DIAGNOSIS — Z885 Allergy status to narcotic agent status: Secondary | ICD-10-CM

## 2018-03-02 DIAGNOSIS — Z66 Do not resuscitate: Secondary | ICD-10-CM | POA: Diagnosis present

## 2018-03-02 DIAGNOSIS — K5900 Constipation, unspecified: Secondary | ICD-10-CM | POA: Diagnosis present

## 2018-03-02 DIAGNOSIS — Z8673 Personal history of transient ischemic attack (TIA), and cerebral infarction without residual deficits: Secondary | ICD-10-CM

## 2018-03-02 DIAGNOSIS — D123 Benign neoplasm of transverse colon: Secondary | ICD-10-CM

## 2018-03-02 DIAGNOSIS — K644 Residual hemorrhoidal skin tags: Secondary | ICD-10-CM | POA: Diagnosis present

## 2018-03-02 DIAGNOSIS — I10 Essential (primary) hypertension: Secondary | ICD-10-CM | POA: Diagnosis present

## 2018-03-02 DIAGNOSIS — N281 Cyst of kidney, acquired: Secondary | ICD-10-CM | POA: Diagnosis present

## 2018-03-02 LAB — COMPREHENSIVE METABOLIC PANEL
ALBUMIN: 3.9 g/dL (ref 3.5–5.0)
ALK PHOS: 57 U/L (ref 38–126)
ALT: 10 U/L — AB (ref 14–54)
AST: 22 U/L (ref 15–41)
Anion gap: 6 (ref 5–15)
BUN: 20 mg/dL (ref 6–20)
CO2: 31 mmol/L (ref 22–32)
CREATININE: 1.36 mg/dL — AB (ref 0.44–1.00)
Calcium: 9.2 mg/dL (ref 8.9–10.3)
Chloride: 92 mmol/L — ABNORMAL LOW (ref 101–111)
GFR calc Af Amer: 38 mL/min — ABNORMAL LOW (ref >60)
GFR calc non Af Amer: 33 mL/min — ABNORMAL LOW (ref >60)
GLUCOSE: 114 mg/dL — AB (ref 65–99)
Potassium: 3.9 mmol/L (ref 3.5–5.1)
SODIUM: 129 mmol/L — AB (ref 135–145)
Total Bilirubin: 0.5 mg/dL (ref 0.3–1.2)
Total Protein: 6.4 g/dL — ABNORMAL LOW (ref 6.5–8.1)

## 2018-03-02 LAB — CBC
HCT: 35.9 % (ref 35.0–47.0)
Hemoglobin: 12.3 g/dL (ref 12.0–16.0)
MCH: 31.6 pg (ref 26.0–34.0)
MCHC: 34.2 g/dL (ref 32.0–36.0)
MCV: 92.4 fL (ref 80.0–100.0)
PLATELETS: 248 10*3/uL (ref 150–440)
RBC: 3.89 MIL/uL (ref 3.80–5.20)
RDW: 14 % (ref 11.5–14.5)
WBC: 7.3 10*3/uL (ref 3.6–11.0)

## 2018-03-02 LAB — GLUCOSE, CAPILLARY: Glucose-Capillary: 113 mg/dL — ABNORMAL HIGH (ref 65–99)

## 2018-03-02 LAB — LIPASE, BLOOD: Lipase: 36 U/L (ref 11–51)

## 2018-03-02 MED ORDER — VITAMIN D3 25 MCG (1000 UNIT) PO TABS
1000.0000 [IU] | ORAL_TABLET | Freq: Every day | ORAL | Status: DC
  Administered 2018-03-03 – 2018-03-05 (×2): 1000 [IU] via ORAL
  Filled 2018-03-02 (×9): qty 1

## 2018-03-02 MED ORDER — ONDANSETRON HCL 4 MG/2ML IJ SOLN
4.0000 mg | Freq: Four times a day (QID) | INTRAMUSCULAR | Status: DC | PRN
  Administered 2018-03-05: 4 mg via INTRAVENOUS
  Filled 2018-03-02: qty 2

## 2018-03-02 MED ORDER — SODIUM CHLORIDE 0.9 % IV SOLN
INTRAVENOUS | Status: DC
  Administered 2018-03-02 – 2018-03-04 (×3): via INTRAVENOUS

## 2018-03-02 MED ORDER — IRBESARTAN 150 MG PO TABS
75.0000 mg | ORAL_TABLET | Freq: Every day | ORAL | Status: DC
  Administered 2018-03-05: 75 mg via ORAL
  Filled 2018-03-02 (×3): qty 1

## 2018-03-02 MED ORDER — ACETAMINOPHEN 325 MG PO TABS
650.0000 mg | ORAL_TABLET | Freq: Four times a day (QID) | ORAL | Status: DC | PRN
  Administered 2018-03-03 – 2018-03-05 (×2): 650 mg via ORAL
  Filled 2018-03-02 (×2): qty 2

## 2018-03-02 MED ORDER — DOXEPIN HCL 10 MG PO CAPS
10.0000 mg | ORAL_CAPSULE | Freq: Every day | ORAL | Status: DC
  Administered 2018-03-02 – 2018-03-06 (×5): 10 mg via ORAL
  Filled 2018-03-02 (×6): qty 1

## 2018-03-02 MED ORDER — ONDANSETRON HCL 4 MG PO TABS: 4.0000 mg | ORAL_TABLET | Freq: Four times a day (QID) | ORAL | Status: DC | PRN

## 2018-03-02 MED ORDER — FLUTICASONE PROPIONATE 50 MCG/ACT NA SUSP
2.0000 | Freq: Every day | NASAL | Status: DC
  Filled 2018-03-02: qty 16

## 2018-03-02 MED ORDER — INSULIN ASPART 100 UNIT/ML ~~LOC~~ SOLN
0.0000 [IU] | Freq: Three times a day (TID) | SUBCUTANEOUS | Status: DC
  Filled 2018-03-02: qty 1

## 2018-03-02 MED ORDER — ACETAMINOPHEN 650 MG RE SUPP: 650.0000 mg | Freq: Four times a day (QID) | RECTAL | Status: DC | PRN

## 2018-03-02 MED ORDER — AMLODIPINE BESYLATE 5 MG PO TABS
2.5000 mg | ORAL_TABLET | Freq: Every day | ORAL | Status: DC
  Administered 2018-03-05: 2.5 mg via ORAL
  Filled 2018-03-02 (×3): qty 1

## 2018-03-02 NOTE — ED Notes (Signed)
Patient transported to [id number] 

## 2018-03-02 NOTE — ED Notes (Signed)
Pt assisted to toilet to void 

## 2018-03-02 NOTE — H&P (Signed)
[facility] [place] NAME: Destiny [name]    MR#:  [id number]  DATE OF BIRTH:  01-27-1924  DATE OF ADMISSION:  03/02/2018  PRIMARY CARE PHYSICIAN: Derinda Late, MD   REQUESTING/REFERRING PHYSICIAN:   CHIEF COMPLAINT:   Chief Complaint  Patient presents with  . Diarrhea  . Rectal Bleeding    HISTORY OF PRESENT ILLNESS: Destiny [name]  is a 84 y.o. female with a known history of diabetes type 2, seasonal allergies, hyperlipidemia and previous stroke who is presenting to the emergency room with complaining of having bright red blood per rectum ongoing for the past few weeks.  She reports that she has been having this on and off.  She also has had loose stools with this.  Patient states that her symptoms got worse today therefore came to the ED.  ER physician did a rectal exam which shows she does have one external hemorrhoid but not actively bleeding.  Patient reports poor appetite. PAST MEDICAL HISTORY:   Past Medical History:  Diagnosis Date  . Allergy   . Diabetes mellitus without complication ([place])   . Hyperlipidemia   . Shingles   . Stroke [facility])     PAST SURGICAL HISTORY:  Past Surgical History:  Procedure Laterality Date  . APPENDECTOMY    . BACK SURGERY    . FOOT SURGERY Bilateral    HAMMERTOE  . JOINT REPLACEMENT    . KNEE SURGERY Bilateral   . TONSILECTOMY, ADENOIDECTOMY, BILATERAL MYRINGOTOMY AND TUBES      SOCIAL HISTORY:  Social History   Tobacco Use  . Smoking status: Never Smoker  . Smokeless tobacco: Never Used  Substance Use Topics  . Alcohol use: No    FAMILY HISTORY:  Family History  Problem Relation Age of Onset  . Stroke Mother   . Stroke Father     DRUG ALLERGIES:  Allergies  Allergen Reactions  . Penicillins     Has patient had a PCN reaction causing immediate rash, facial/tongue/throat swelling, SOB or lightheadedness with hypotension: Unknown Has patient had a PCN reaction causing severe rash  involving mucus membranes or skin necrosis: Unknown Has patient had a PCN reaction that required hospitalization: Unknown Has patient had a PCN reaction occurring within the last 10 years: No If all of the above answers are "NO", then may proceed with Cephalosporin use.   . Ampicillin Rash  . Hydrocodone-Homatropine     Other reaction(s): Other (See Comments) tachycardia  . Amoxicillin Rash  . Tramadol Itching    REVIEW OF SYSTEMS:   CONSTITUTIONAL: No fever, fatigue or weakness.  EYES: No blurred or double vision.  EARS, NOSE, AND THROAT: No tinnitus or ear pain.  RESPIRATORY: No cough, shortness of breath, wheezing or hemoptysis.  CARDIOVASCULAR: No chest pain, orthopnea, edema.  GASTROINTESTINAL: No nausea, vomiting, positive diarrhea or abdominal pain.  Positive bright red blood per rectum GENITOURINARY: No dysuria, hematuria.  ENDOCRINE: No polyuria, nocturia,  HEMATOLOGY: No anemia, easy bruising or bleeding SKIN: No rash or lesion. MUSCULOSKELETAL: No joint pain or arthritis.   NEUROLOGIC: No tingling, numbness, weakness.  PSYCHIATRY: No anxiety or depression.   MEDICATIONS AT HOME:  Prior to Admission medications   Medication Sig Start Date End Date Taking? Authorizing Provider  amLODipine (NORVASC) 2.5 MG tablet Take 1 tablet (2.5 mg total) by mouth daily. Take once daily if blood pressure > 160 10/04/16 02/03/25 Yes Merlyn Lot, MD  aspirin EC 81 MG tablet Take 1 tablet (  81 mg total) by mouth daily. 07/30/17 07/30/18 Yes Vaughan Basta, MD  butalbital-acetaminophen-caffeine (FIORICET, ESGIC) [phone] MG tablet Take 1 tablet by mouth every 4 (four) hours as needed for headache.   Yes [provider]  cholecalciferol (VITAMIN D) 1000 units tablet Take 1,000 Units by mouth daily.   Yes [provider]  doxepin (SINEQUAN) 10 MG capsule TAKE ONE CAPSULE BY MOUTH EVERY NIGHT AT BEDTIME 07/30/16  Yes [provider]  fluticasone  (FLONASE) 50 MCG/ACT nasal spray Place 2 sprays into both nostrils daily.   Yes [provider]  hydrochlorothiazide (HYDRODIURIL) 12.5 MG tablet Take 1 tablet (12.5 mg total) by mouth daily. 02/03/18  Yes Loney Hering, MD  ibuprofen (ADVIL,MOTRIN) 200 MG tablet Take 400 mg by mouth at bedtime as needed for mild pain.   Yes [provider]  metFORMIN (GLUCOPHAGE) 500 MG tablet TAKE ONE (1) TABLET BY MOUTH EVERY DAY 07/01/16  Yes [provider]  telmisartan (MICARDIS) 80 MG tablet Take 1 tablet by mouth daily. 07/25/17  Yes [provider]  vitamin B-12 (CYANOCOBALAMIN) 1000 MCG tablet Take 1,000 mcg by mouth daily.   Yes [provider]      PHYSICAL EXAMINATION:   VITAL SIGNS: Blood pressure 127/69, pulse 77, temperature 98.6 F (37 C), temperature source Oral, resp. rate 20, height 5' (1.524 m), weight 56.7 kg (125 lb), SpO2 95 %.  GENERAL:  84 y.o.-year-old patient lying in the bed with no acute distress.  EYES: Pupils equal, round, reactive to light and accommodation. No scleral icterus. Extraocular muscles intact.  HEENT: Head atraumatic, normocephalic. Oropharynx and nasopharynx clear.  NECK:  Supple, no jugular venous distention. No thyroid enlargement, no tenderness.  LUNGS: Normal breath sounds bilaterally, no wheezing, rales,rhonchi or crepitation. No use of accessory muscles of respiration.  CARDIOVASCULAR: S1, S2 normal. No murmurs, rubs, or gallops.  ABDOMEN: Soft, nontender, nondistended. Bowel sounds present. No organomegaly or mass.  EXTREMITIES: No pedal edema, cyanosis, or clubbing.  NEUROLOGIC: Cranial nerves II through XII are intact. Muscle strength 5/5 in all extremities. Sensation intact. Gait not checked.  PSYCHIATRIC: The patient is alert and oriented x 3.  SKIN: No obvious rash, lesion, or ulcer.   LABORATORY PANEL:   CBC Recent Labs  Lab 03/02/18 1621  WBC 7.3  HGB 12.3  HCT 35.9  PLT 248  MCV 92.4  MCH  31.6  MCHC 34.2  RDW 14.0   ------------------------------------------------------------------------------------------------------------------  Chemistries  Recent Labs  Lab 03/02/18 1621  NA 129*  K 3.9  CL 92*  CO2 31  GLUCOSE 114*  BUN 20  CREATININE 1.36*  CALCIUM 9.2  AST 22  ALT 10*  ALKPHOS 57  BILITOT 0.5   ------------------------------------------------------------------------------------------------------------------ estimated creatinine clearance is 20.8 mL/min (A) (by C-G formula based on SCr of 1.36 mg/dL (H)). ------------------------------------------------------------------------------------------------------------------ No results for input(s): TSH, T4TOTAL, T3FREE, THYROIDAB in the last 72 hours.  Invalid input(s): FREET3   Coagulation profile No results for input(s): INR, PROTIME in the last 168 hours. ------------------------------------------------------------------------------------------------------------------- No results for input(s): DDIMER in the last 72 hours. -------------------------------------------------------------------------------------------------------------------  Cardiac Enzymes No results for input(s): CKMB, TROPONINI, MYOGLOBIN in the last 168 hours.  Invalid input(s): CK ------------------------------------------------------------------------------------------------------------------ Invalid input(s): POCBNP  ---------------------------------------------------------------------------------------------------------------  Urinalysis    Component Value Date/Time   COLORURINE STRAW (A) 07/30/2017 0121   APPEARANCEUR CLEAR (A) 07/30/2017 0121   LABSPEC 1.005 07/30/2017 0121   PHURINE 7.0 07/30/2017 0121   GLUCOSEU NEGATIVE 07/30/2017 0121   HGBUR NEGATIVE 07/30/2017  [place] 07/30/2017 [place] 07/30/2017 0121   PROTEINUR NEGATIVE 07/30/2017 0121   NITRITE NEGATIVE 07/30/2017 0121    LEUKOCYTESUR NEGATIVE 07/30/2017 0121     RADIOLOGY: No results found.  EKG: Orders placed or performed during the hospital encounter of 02/03/18  . ED EKG  . ED EKG  . EKG 12-Lead  . EKG 12-Lead  . EKG    IMPRESSION AND PLAN: Patient is a 84 year old [place] female presenting with bright red blood per rectum  1.  Lower GI bleed Hemoglobin is stable Patient will have a bleeding scan Will ask GI to see may need a sigmoidoscopy or colonoscopy Hold aspirin 2.  Essential hypertension continue therapy with Norvasc, Micardis hold HCTZ  3.  Diabetes type 2 sliding scale insulin hold metformin for now  4.  Previous stroke hold aspirin  5.  CODE STATUS discussed with the patient limited code   All the records are reviewed and case discussed with ED provider. Management plans discussed with the patient, family and they are in agreement.  CODE STATUS: Code Status History    Date Active Date Inactive Code Status Order ID Comments User Context   07/30/2017 0233 07/30/2017 2248 Full Code [id number]  Saundra Shelling, MD Inpatient       TOTAL TIME TAKING CARE OF THIS PATIENT: 55 minutes.    Dustin Flock M.D on 03/02/2018 at 6:15 PM  Between 7am to 6pm - Pager - [phone]  After 6pm go to [email or web address] - password [email or web address] [facility]  [phone]  CC: Primary care physician; Derinda Late, MD

## 2018-03-02 NOTE — ED Triage Notes (Addendum)
States has had diarrhea x 1 week and has bright red blood also. States has been happening every day. Denies abdominal pain. States she was on Plavix til 3 days ago and that she discontinued it due to bleeding.

## 2018-03-02 NOTE — ED Notes (Signed)
Pt states for the past 4-6 months+ she has "bleeding hemorrhoids" that started stinging and burning today - she states the hemorrhoids are irritating to her at this point - pt denies any other pain or problems

## 2018-03-02 NOTE — Progress Notes (Signed)
Advanced care plan.  Purpose of the Encounter: CODE STATUS  Parties in Attendance: patient and daughter  Patient's Decision Capacity: intact  Subjective/Patient's story: Destiny [name]  is a 84 y.o. female with a known history of diabetes type 2, seasonal allergies, hyperlipidemia and previous stroke presents with bright red blood per rectum.      Objective/Medical story I discussed with the patient regarding her desires for resuscitation.  Explained her what cardiac resuscitation involves.  Also discussed with her regarding pulmonary resuscitation including intubation Patient states that she would want cardiac chest compressions and medications to revive the heart.  She does not want to be intubated   Goals of care determination:   Partial code  CODE STATUS: Partial code no intubation   Time spent discussing advanced care planning: 16 minutes

## 2018-03-02 NOTE — ED Provider Notes (Signed)
[facility] Emergency Department Provider Note  Time seen: 5:51 PM  I have reviewed the triage vital signs and the nursing notes.   HISTORY  Chief Complaint Diarrhea and Rectal Bleeding    HPI Destiny [name] is a 84 y.o. female with a past medical history of diabetes, hyperlipidemia, presents to the emergency department for rectal bleeding.  Patient takes a daily 81 mg aspirin as her only anticoagulant.  She states she has been expensing loose stool for the past 2 weeks and noted there to be blood within the stool.  Patient has a known hemorrhoid and figured it was just due to the hemorrhoid.  However she states recently the bleeding has increased, she became concerned so she came to the emergency department for evaluation.  Denies any abdominal pain, nausea, vomiting, fever, lightheadedness or dizziness.  Denies any history of GI bleed previously.   Past Medical History:  Diagnosis Date  . Allergy   . Diabetes mellitus without complication ([place])   . Hyperlipidemia   . Shingles   . Stroke [facility])     Patient Active Problem List   Diagnosis Date Noted  . Spondylosis without myelopathy or radiculopathy, lumbosacral region 01/29/2018  . Trigger point with back pain (buttocks area) (Right) 11/18/2017  . Groin pain (Right) 11/18/2017  . Trochanteric bursitis of hip (Right) 10/20/2017  . Lumbar facet syndrome (Bilateral) 09/18/2017  . Osteoarthritis of sacroiliac joints (Bilateral) 09/17/2017  . Osteoarthritis of hip (Bilateral) 09/17/2017  . Osteoarthritis of lumbar spine 09/17/2017  . Lumbar spondylosis 09/17/2017  . Long term current use of anticoagulant (Plavix) 09/17/2017  . DDD (degenerative disc disease), lumbar 09/17/2017  . Lumbar facet hypertrophy 09/17/2017  . Chronic low back pain (Primary Area of Pain) (Bilateral) (L>R) 09/08/2017  . Chronic sacroiliac joint pain (Bilateral) (L>R) 09/08/2017  . Chronic hip pain (Bilateral) (L>R) 09/08/2017  .  Chronic pain syndrome 09/08/2017  . Diabetes mellitus type 2, uncomplicated ([place]) 81/85/6314  . CVA (cerebral vascular accident) ([place]) 07/30/2017  . Chronic myofascial pain 08/01/2016  . Esophageal reflux 03/01/2014  . Benign essential hypertension 03/01/2014  . Migraine 03/01/2014  . Other and unspecified hyperlipidemia 03/01/2014  . Type II or unspecified type diabetes mellitus without mention of complication, not stated as uncontrolled 03/01/2014  . Pseudophakia, both eyes 10/30/2012  . 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 APPENDECTOMY    . BACK SURGERY    . FOOT SURGERY Bilateral    HAMMERTOE  . JOINT REPLACEMENT    . KNEE SURGERY Bilateral   . TONSILECTOMY, ADENOIDECTOMY, BILATERAL MYRINGOTOMY AND TUBES      Prior to Admission medications   Medication Sig Start Date End Date Taking? Authorizing Provider  amLODipine (NORVASC) 2.5 MG tablet Take 1 tablet (2.5 mg total) by mouth daily. Take once daily if blood pressure > 160 10/04/16 02/03/25 Yes Merlyn Lot, MD  aspirin EC 81 MG tablet Take 1 tablet (81 mg total) by mouth daily. 07/30/17 07/30/18 Yes Vaughan Basta, MD  doxepin (SINEQUAN) 10 MG capsule TAKE ONE CAPSULE BY MOUTH EVERY NIGHT AT BEDTIME 07/30/16  Yes [provider]  metFORMIN (GLUCOPHAGE) 500 MG tablet TAKE ONE (1) TABLET BY MOUTH EVERY DAY 07/01/16  Yes [provider]  telmisartan (MICARDIS) 80 MG tablet Take 1 tablet by mouth daily. 07/25/17  Yes [provider]  hydrochlorothiazide (HYDRODIURIL) 12.5 MG tablet Take 1 tablet (12.5 mg total) by mouth daily. 02/03/18  Loney Hering, MD    Allergies  Allergen Reactions  . Penicillins     Other reaction(s): Other (See Comments) Other reaction  . Ampicillin Rash  . Hydrocodone-Homatropine     Other reaction(s): Other (See Comments) tachycardia  . Amoxicillin Rash  . Tramadol Itching    Family History   Problem Relation Age of Onset  . Stroke Mother   . Stroke Father     Social History Social History   Tobacco Use  . Smoking status: Never Smoker  . Smokeless tobacco: Never Used  Substance Use Topics  . Alcohol use: No  . Drug use: No    Review of Systems Constitutional: Negative for fever. Eyes: Negative for visual complaints ENT: Negative for recent illness/congestion Cardiovascular: Negative for chest pain. Respiratory: Negative for shortness of breath. Gastrointestinal: Negative for abdominal pain, vomiting.  Loose stool with red blood. Genitourinary: Negative for urinary compaints Musculoskeletal: Negative for musculoskeletal complaints Skin: Negative for skin complaints  Neurological: Negative for headache All other ROS negative  ____________________________________________   PHYSICAL EXAM:  VITAL SIGNS: ED Triage Vitals  Enc Vitals Group     BP 03/02/18 1618 (!) 123/54     Pulse Rate 03/02/18 1618 82     Resp 03/02/18 1618 20     Temp 03/02/18 1618 98.6 F (37 C)     Temp Source 03/02/18 1618 Oral     SpO2 03/02/18 1618 98 %     Weight 03/02/18 1619 125 lb (56.7 kg)     Height 03/02/18 1619 5' (1.524 m)     Head Circumference --      Peak Flow --      Pain Score 03/02/18 1619 0     Pain Loc --      Pain Edu? --      Excl. in [place]? --     Constitutional: Alert and oriented. Well appearing and in no distress. Eyes: Normal exam ENT   Head: Normocephalic and atraumatic.   Mouth/Throat: Mucous membranes are moist. Cardiovascular: Normal rate, regular rhythm.  Respiratory: Normal respiratory effort without tachypnea nor retractions. Breath sounds are clear Gastrointestinal: Soft and nontender. No distention. Musculoskeletal: Nontender with normal range of motion in all extremities. Neurologic:  Normal speech and language. No gross focal neurologic deficits  Skin:  Skin is warm, dry and intact.  Psychiatric: Mood and affect are normal.    ____________________________________________   INITIAL IMPRESSION / ASSESSMENT AND PLAN / ED COURSE  Pertinent labs & imaging results that were available during my care of the patient were reviewed by me and considered in my medical decision making (see chart for details).  Patient presents to the emergency department for diarrhea with bright red blood per patient.  Ongoing for 2 weeks but getting worse.  Differential includes lower GI bleed, hemorrhoidal bleeding with diverticulosis, diverticulitis.  Patient has a completely nontender abdominal exam.  Rectal exam shows hematochezia, strongly guaiac positive.  Patient's labs show hemoglobin largely unchanged from baseline.  Sodium of 129.  Given the patient's hematochezia on exam worsening per patient will admit the patient to the hospitalist service for further work-up and treatment.  Given the active bleed after discussing with the hospitalist we will order a tagged red blood cell scan.  Patient agreeable to plan of care. ____________________________________________   FINAL CLINICAL IMPRESSION(S) / ED DIAGNOSES  Hematochezia Lower GI bleed    Harvest Dark, MD 03/02/18 1758

## 2018-03-03 ENCOUNTER — Observation Stay: Payer: Medicare Other

## 2018-03-03 DIAGNOSIS — Z823 Family history of stroke: Secondary | ICD-10-CM | POA: Diagnosis not present

## 2018-03-03 DIAGNOSIS — I10 Essential (primary) hypertension: Secondary | ICD-10-CM | POA: Diagnosis present

## 2018-03-03 DIAGNOSIS — D123 Benign neoplasm of transverse colon: Secondary | ICD-10-CM | POA: Diagnosis present

## 2018-03-03 DIAGNOSIS — D125 Benign neoplasm of sigmoid colon: Secondary | ICD-10-CM | POA: Diagnosis present

## 2018-03-03 DIAGNOSIS — E119 Type 2 diabetes mellitus without complications: Secondary | ICD-10-CM | POA: Diagnosis present

## 2018-03-03 DIAGNOSIS — Z66 Do not resuscitate: Secondary | ICD-10-CM | POA: Diagnosis present

## 2018-03-03 DIAGNOSIS — D490 Neoplasm of unspecified behavior of digestive system: Secondary | ICD-10-CM | POA: Diagnosis not present

## 2018-03-03 DIAGNOSIS — E785 Hyperlipidemia, unspecified: Secondary | ICD-10-CM | POA: Diagnosis present

## 2018-03-03 DIAGNOSIS — K922 Gastrointestinal hemorrhage, unspecified: Secondary | ICD-10-CM | POA: Diagnosis present

## 2018-03-03 DIAGNOSIS — Z79899 Other long term (current) drug therapy: Secondary | ICD-10-CM | POA: Diagnosis not present

## 2018-03-03 DIAGNOSIS — K644 Residual hemorrhoidal skin tags: Secondary | ICD-10-CM | POA: Diagnosis present

## 2018-03-03 DIAGNOSIS — Z880 Allergy status to penicillin: Secondary | ICD-10-CM | POA: Diagnosis not present

## 2018-03-03 DIAGNOSIS — K5900 Constipation, unspecified: Secondary | ICD-10-CM | POA: Diagnosis present

## 2018-03-03 DIAGNOSIS — Z9089 Acquired absence of other organs: Secondary | ICD-10-CM | POA: Diagnosis not present

## 2018-03-03 DIAGNOSIS — N281 Cyst of kidney, acquired: Secondary | ICD-10-CM | POA: Diagnosis present

## 2018-03-03 DIAGNOSIS — Z7982 Long term (current) use of aspirin: Secondary | ICD-10-CM | POA: Diagnosis not present

## 2018-03-03 DIAGNOSIS — Z8673 Personal history of transient ischemic attack (TIA), and cerebral infarction without residual deficits: Secondary | ICD-10-CM | POA: Diagnosis not present

## 2018-03-03 DIAGNOSIS — Z885 Allergy status to narcotic agent status: Secondary | ICD-10-CM | POA: Diagnosis not present

## 2018-03-03 DIAGNOSIS — K629 Disease of anus and rectum, unspecified: Secondary | ICD-10-CM | POA: Diagnosis not present

## 2018-03-03 DIAGNOSIS — K6289 Other specified diseases of anus and rectum: Secondary | ICD-10-CM | POA: Diagnosis not present

## 2018-03-03 DIAGNOSIS — K5730 Diverticulosis of large intestine without perforation or abscess without bleeding: Secondary | ICD-10-CM | POA: Diagnosis present

## 2018-03-03 DIAGNOSIS — K921 Melena: Secondary | ICD-10-CM | POA: Diagnosis present

## 2018-03-03 DIAGNOSIS — K648 Other hemorrhoids: Secondary | ICD-10-CM | POA: Diagnosis not present

## 2018-03-03 DIAGNOSIS — Z9049 Acquired absence of other specified parts of digestive tract: Secondary | ICD-10-CM | POA: Diagnosis not present

## 2018-03-03 DIAGNOSIS — K6389 Other specified diseases of intestine: Secondary | ICD-10-CM | POA: Diagnosis not present

## 2018-03-03 DIAGNOSIS — D500 Iron deficiency anemia secondary to blood loss (chronic): Secondary | ICD-10-CM | POA: Diagnosis not present

## 2018-03-03 DIAGNOSIS — Z7984 Long term (current) use of oral hypoglycemic drugs: Secondary | ICD-10-CM | POA: Diagnosis not present

## 2018-03-03 DIAGNOSIS — Z881 Allergy status to other antibiotic agents status: Secondary | ICD-10-CM | POA: Diagnosis not present

## 2018-03-03 DIAGNOSIS — C20 Malignant neoplasm of rectum: Secondary | ICD-10-CM | POA: Diagnosis present

## 2018-03-03 LAB — BASIC METABOLIC PANEL
ANION GAP: 5 (ref 5–15)
BUN: 14 mg/dL (ref 6–20)
CHLORIDE: 99 mmol/L — AB (ref 101–111)
CO2: 30 mmol/L (ref 22–32)
Calcium: 8.5 mg/dL — ABNORMAL LOW (ref 8.9–10.3)
Creatinine, Ser: 0.81 mg/dL (ref 0.44–1.00)
GFR calc Af Amer: >60 mL/min (ref >60)
GLUCOSE: 117 mg/dL — AB (ref 65–99)
POTASSIUM: 3.6 mmol/L (ref 3.5–5.1)
Sodium: 134 mmol/L — ABNORMAL LOW (ref 135–145)

## 2018-03-03 LAB — CBC
HEMATOCRIT: 33.2 % — AB (ref 35.0–47.0)
HEMOGLOBIN: 11.3 g/dL — AB (ref 12.0–16.0)
MCH: 31.1 pg (ref 26.0–34.0)
MCHC: 34.1 g/dL (ref 32.0–36.0)
MCV: 91.2 fL (ref 80.0–100.0)
Platelets: 234 10*3/uL (ref 150–440)
RBC: 3.64 MIL/uL — AB (ref 3.80–5.20)
RDW: 14.2 % (ref 11.5–14.5)
WBC: 6 10*3/uL (ref 3.6–11.0)

## 2018-03-03 LAB — GLUCOSE, CAPILLARY
Glucose-Capillary: 125 mg/dL — ABNORMAL HIGH (ref 65–99)
Glucose-Capillary: 97 mg/dL (ref 65–99)
Glucose-Capillary: 108 mg/dL — ABNORMAL HIGH (ref 65–99)
Glucose-Capillary: 116 mg/dL — ABNORMAL HIGH (ref 65–99)

## 2018-03-03 MED ORDER — FLEET ENEMA 7-19 GM/118ML RE ENEM
1.0000 | ENEMA | Freq: Once | RECTAL | Status: AC
  Administered 2018-03-04: 1 via RECTAL

## 2018-03-03 MED ORDER — TECHNETIUM TC 99M-LABELED RED BLOOD CELLS IV KIT
25.0000 | PACK | Freq: Once | INTRAVENOUS | Status: AC | PRN
  Administered 2018-03-03: 22.68 via INTRAVENOUS

## 2018-03-03 MED ORDER — ZOLPIDEM TARTRATE 5 MG PO TABS
5.0000 mg | ORAL_TABLET | Freq: Every evening | ORAL | Status: DC | PRN
  Administered 2018-03-03 – 2018-03-06 (×4): 5 mg via ORAL
  Filled 2018-03-03 (×4): qty 1

## 2018-03-03 MED ORDER — PREMIER PROTEIN SHAKE: 11.0000 [oz_av] | Freq: Two times a day (BID) | ORAL | Status: DC

## 2018-03-03 NOTE — Progress Notes (Signed)
[facility] [place] NAME: Ellory Khurana    MR#:  [id number]  DATE OF BIRTH:  08/24/1924  SUBJECTIVE:  Patient seen and evaluated today  patient had some small bloody bowel movement this morning No chest pain No shortness of breath   REVIEW OF SYSTEMS:    ROS  CONSTITUTIONAL: No documented fever. No fatigue, weakness. No weight gain, no weight loss.  EYES: No blurry or double vision.  ENT: No tinnitus. No postnasal drip. No redness of the oropharynx.  RESPIRATORY: No cough, no wheeze, no hemoptysis. No dyspnea.  CARDIOVASCULAR: No chest pain. No orthopnea. No palpitations. No syncope.  GASTROINTESTINAL: No nausea, no vomiting or diarrhea. No abdominal pain. No melena or hematochezia.  GENITOURINARY: No dysuria or hematuria.  ENDOCRINE: No polyuria or nocturia. No heat or cold intolerance.  HEMATOLOGY: No anemia. No bruising. No bleeding.  INTEGUMENTARY: No rashes. No lesions.  MUSCULOSKELETAL: No arthritis. No swelling. No gout.  NEUROLOGIC: No numbness, tingling, or ataxia. No seizure-type activity.  PSYCHIATRIC: No anxiety. No insomnia. No ADD.   DRUG ALLERGIES:   Allergies  Allergen Reactions  . Penicillins     Has patient had a PCN reaction causing immediate rash, facial/tongue/throat swelling, SOB or lightheadedness with hypotension: Unknown Has patient had a PCN reaction causing severe rash involving mucus membranes or skin necrosis: Unknown Has patient had a PCN reaction that required hospitalization: Unknown Has patient had a PCN reaction occurring within the last 10 years: No If all of the above answers are "NO", then may proceed with Cephalosporin use.   . Ampicillin Rash  . Hydrocodone-Homatropine     Other reaction(s): Other (See Comments) tachycardia  . Amoxicillin Rash  . Tramadol Itching    VITALS:  Blood pressure (!) 115/42, pulse 69, temperature 98 F (36.7 C), temperature source Oral, resp. rate 18, height  5' (1.524 m), weight 58 kg (127 lb 13.9 oz), SpO2 95 %.  PHYSICAL EXAMINATION:   Physical Exam  GENERAL:  84 y.o.-year-old patient lying in the bed with no acute distress.  EYES: Pupils equal, round, reactive to light and accommodation. No scleral icterus. Extraocular muscles intact.  HEENT: Head atraumatic, normocephalic. Oropharynx and nasopharynx clear.  NECK:  Supple, no jugular venous distention. No thyroid enlargement, no tenderness.  LUNGS: Normal breath sounds bilaterally, no wheezing, rales, rhonchi. No use of accessory muscles of respiration.  CARDIOVASCULAR: S1, S2 normal. No murmurs, rubs, or gallops.  ABDOMEN: Soft, nontender, nondistended. Bowel sounds present. No organomegaly or mass.  EXTREMITIES: No cyanosis, clubbing or edema b/l.    NEUROLOGIC: Cranial nerves II through XII are intact. No focal Motor or sensory deficits b/l.   PSYCHIATRIC: The patient is alert and oriented x 3.  SKIN: Small skin tear over the left lower extremity  LABORATORY PANEL:   CBC Recent Labs  Lab 03/03/18 0431  WBC 6.0  HGB 11.3*  HCT 33.2*  PLT 234   ------------------------------------------------------------------------------------------------------------------ Chemistries  Recent Labs  Lab 03/02/18 1621 03/03/18 0431  NA 129* 134*  K 3.9 3.6  CL 92* 99*  CO2 31 30  GLUCOSE 114* 117*  BUN 20 14  CREATININE 1.36* 0.81  CALCIUM 9.2 8.5*  AST 22  --   ALT 10*  --   ALKPHOS 57  --   BILITOT 0.5  --    ------------------------------------------------------------------------------------------------------------------  Cardiac Enzymes No results for input(s): TROPONINI in the last 168 hours. ------------------------------------------------------------------------------------------------------------------  RADIOLOGY:  Nm Gi Blood Loss  Result  Date: 03/03/2018 CLINICAL DATA:  Gastrointestinal bleeding, bloody diarrhea for 1 week, remote appendectomy EXAM: NUCLEAR MEDICINE  GASTROINTESTINAL BLEEDING SCAN TECHNIQUE: Sequential abdominal images were obtained following intravenous administration of Tc-29m labeled red blood cells. RADIOPHARMACEUTICALS:  22.68 mCi Tc-60m pertechnetate in-vitro labeled red cells. COMPARISON:  None FINDINGS: Normal blood pool distribution of labeled red cells. Small amount of de labeled tracer within the urinary bladder. No abnormal gastrointestinal localization of tracer identified to suggest active GI bleeding. IMPRESSION: Negative GI bleeding study. Electronically Signed   By: Lavonia Dana M.D.   On: 03/03/2018 12:26     ASSESSMENT AND PLAN:  84 year old elderly female patient with history of diabetes mellitus type 2, hyperlipidemia, CVA, shingles presented to the emergency room with rectal bleed  -Gastrointestinal bleeding Status post gastroenterology evaluation Rectal exam revealed mass Sigmoidoscopy and biopsy in the morning by gastroenterology Status post bleeding scan , negative study Monitor hemoglobin hematocrit  -Diabetes mellitus type 2 Currently on clear liquid diet Sliding scale coverage with insulin as needed  -Aspirin on hold  -DVT prophylaxis sequential compression device to lower extremities  -Hypertension continue Norvasc  All the records are reviewed and case discussed with Care Management/Social Worker. Management plans discussed with the patient, family and they are in agreement.  CODE STATUS: Full code  DVT Prophylaxis: SCDs  TOTAL TIME TAKING CARE OF THIS PATIENT: 35 minutes.   POSSIBLE D/C IN 2 to 3 DAYS, DEPENDING ON CLINICAL CONDITION.  Saundra Shelling M.D on 03/03/2018 at 4:08 PM  Between 7am to 6pm - Pager - [phone]  After 6pm go to [email or web address] - password [email or web address] [facility]  Office  [phone]  CC: Primary care physician; Derinda Late, MD  Note: This dictation was prepared with Dragon dictation along with smaller phrase technology. Any transcriptional  errors that result from this process are unintentional.

## 2018-03-03 NOTE — Consult Note (Signed)
Vonda Antigua, MD [address], [place], [place], [place], [address] [address], [place], [place], [place], [address] Phone: [phone]  Fax: [phone]  Consultation  Referring Provider:     Dr. Estanislado Pandy Primary Care Physician:  Derinda Late, MD Reason for Consultation:     Bright blood per rectum  Date of Admission:  03/02/2018 Date of Consultation:  03/03/2018         HPI:   Destiny [name] is a 84 y.o. female who presented with bright red blood per rectum, ongoing for  6 to 7 months, with increased frequency over the last 2 to 3 months.  Reports having bright red blood in the toilet paper every day for the last 2 to 3 months.  States about 2-3 times, she has seen that the toilet bowl itself is red as well.  Has not seen any clots.  No melena.  Reports constipation and straining over the last few months as well.  ER rectal exam "Rectal exam shows hematochezia, strongly guaiac positive."  Patient is hemodynamically stable.  No abdominal pain.  No nausea or vomiting.  No melena.  States last colonoscopy was well over 20 to 25 years ago.  No weight loss  Hemoglobin of 11.3, at its lowest.  4 weeks ago hemoglobin was 13.3.  No microcytosis.  ER ordered a tagged RBC scan which was negative.  Patient reports history of a stroke in October 2018.  Patient was placed on Plavix.  Patient states she has not taken Plavix in 1 week, since May 13.  she states she did not discuss this with her primary care provider, and decided to stop it herself, as she was noticing bruises on her arms and legs with it.  Past Medical History:  Diagnosis Date  . Allergy   . Diabetes mellitus without complication ([place])   . Hyperlipidemia   . Shingles   . Stroke [facility])     Past Surgical History:  Procedure Laterality Date  . APPENDECTOMY    . BACK SURGERY    . FOOT SURGERY Bilateral    [place]  . JOINT REPLACEMENT    . KNEE SURGERY Bilateral   . TONSILECTOMY, ADENOIDECTOMY, BILATERAL  MYRINGOTOMY AND TUBES      Prior to Admission medications   Medication Sig Start Date End Date Taking? Authorizing Provider  amLODipine (NORVASC) 2.5 MG tablet Take 1 tablet (2.5 mg total) by mouth daily. Take once daily if blood pressure > 160 10/04/16 02/03/25 Yes Merlyn Lot, MD  aspirin EC 81 MG tablet Take 1 tablet (81 mg total) by mouth daily. 07/30/17 07/30/18 Yes Vaughan Basta, MD  butalbital-acetaminophen-caffeine (FIORICET, ESGIC) [id number] MG tablet Take 1 tablet by mouth every 4 (four) hours as needed for headache.   Yes [provider]  cholecalciferol (VITAMIN D) 1000 units tablet Take 1,000 Units by mouth daily.   Yes [provider]  doxepin (SINEQUAN) 10 MG capsule TAKE ONE CAPSULE BY MOUTH EVERY NIGHT AT BEDTIME 07/30/16  Yes [provider]  fluticasone (FLONASE) 50 MCG/ACT nasal spray Place 2 sprays into both nostrils daily.   Yes [provider]  hydrochlorothiazide (HYDRODIURIL) 12.5 MG tablet Take 1 tablet (12.5 mg total) by mouth daily. 02/03/18  Yes Loney Hering, MD  ibuprofen (ADVIL,MOTRIN) 200 MG tablet Take 400 mg by mouth at bedtime as needed for mild pain.   Yes [provider]  metFORMIN (GLUCOPHAGE) 500 MG tablet TAKE ONE (1) TABLET BY MOUTH EVERY DAY 07/01/16  Yes [provider]  telmisartan (MICARDIS) 80 MG tablet Take 1 tablet by mouth daily. 07/25/17  Yes [provider]  vitamin B-12 (CYANOCOBALAMIN) 1000 MCG tablet Take 1,000 mcg by mouth daily.   Yes [provider]    Family History  Problem Relation Age of Onset  . Stroke Mother   . Stroke Father      Social History   Tobacco Use  . Smoking status: Never Smoker  . Smokeless tobacco: Never Used  Substance Use Topics  . Alcohol use: No  . Drug use: No    Allergies as of 03/02/2018 - Review Complete 03/02/2018  Allergen Reaction Noted  . Penicillins  08/12/2015  . Ampicillin Rash 08/01/2016  .  Hydrocodone-homatropine  01/24/2014  . Amoxicillin Rash 08/12/2015  . Tramadol Itching 08/12/2015    Review of Systems:    All systems reviewed and negative except where noted in HPI.   Physical Exam:  Vital signs in last 24 hours: Vitals:   03/02/18 2000 03/02/18 2358 03/03/18 0736 03/03/18 0900  BP: (!) 148/88 (!) 99/43 (!) 114/54 (!) 109/39  [name]: 83 70 69   Resp: 18 18 18    Temp: 97.9 F (36.6 C) 98.1 F (36.7 C) 98 F (36.7 C)   TempSrc: Oral Oral Oral   SpO2: 100% 97% 95%   Weight: 127 lb 13.9 oz (58 kg)     Height:       Last BM Date: 03/02/18 General:   Pleasant, cooperative in NAD Head:  Normocephalic and atraumatic. Eyes:   No icterus.   Conjunctiva pink. PERRLA. Ears:  Normal auditory acuity. Neck:  Supple; no masses or thyroidomegaly Lungs: Respirations even and unlabored. Lungs clear to auscultation bilaterally.   No wheezes, crackles, or rhonchi.  Abdomen:  Soft, nondistended, nontender. Normal bowel sounds. No appreciable masses or hepatomegaly.  No rebound or guarding.  Rectal exam: External hemorrhoids, nonbleeding.  Digital rectal exam revealed a soft non-mobile mass in the rectum, on the posterior aspect Neurologic:  Alert and oriented x3;  grossly normal neurologically. Skin:  Intact without significant lesions or rashes. Cervical Nodes:  No significant cervical adenopathy. Psych:  Alert and cooperative. Normal affect.  LAB RESULTS: Recent Labs    03/02/18 1621 03/03/18 0431  WBC 7.3 6.0  HGB 12.3 11.3*  HCT 35.9 33.2*  PLT 248 234   BMET Recent Labs    03/02/18 1621 03/03/18 0431  NA 129* 134*  K 3.9 3.6  CL 92* 99*  CO2 31 30  GLUCOSE 114* 117*  BUN 20 14  CREATININE 1.36* 0.81  CALCIUM 9.2 8.5*   LFT Recent Labs    03/02/18 1621  PROT 6.4*  ALBUMIN 3.9  AST 22  ALT 10*  ALKPHOS 57  BILITOT 0.5   PT/INR No results for input(s): LABPROT, INR in the last 72 hours.  STUDIES: No results found.    Impression / Plan:    Destiny [name] is a 84 y.o. y/o female with hematochezia, with stable vital signs, and lowest hemoglobin 11.3, with negative RBC scan, with digital rectal exam today revealing a soft non-mobile rectal mass on the posterior aspect  Rectal mass identified on rectal exam today is concerning for rectal cancer.  It could be a large ball of stool as well, although less likely. This was discussed with patient and daughter in detail Endoscopic evaluation for visualization is thus indicated for diagnosis and biopsies  I went through several options in detail with patient and  family and discussed risks and benefits of each option  Patient ate solid food today, and colonoscopy prep today is unlikely to clean out colon well enough for proper colonoscopy exam tomorrow. As rectal mass was noted on rectal exam, we will thus proceed with unsedated flexible sigmoidoscopy tomorrow, to evaluate rectum first, and obtain biopsies if a mass is present. Will order Fleet enema to be given 30 to 60 minutes prior to the procedure.  Unsedated flexible sigmoidoscopy tomorrow, will also avoid risks associated with sedation with full colonoscopy, and given her stroke in October 2018 and her being off Plavix (on her own decision without discussing it with a medical provider) for the last week, will proceed with any need for sedation with caution, and after weighing risks and benefits for each procedure thoroughly.  Flexible sigmoidoscopy, will allow to evaluate if a full colonoscopy is in fact needed, or if it is even possible, if the mass is obstructing (patient has been complaining of constipation, however she is not obstipated).  I have discussed alternative options, risks & benefits,  which include, but are not limited to, bleeding, infection, perforation,respiratory complication & drug reaction.  The patient agrees with this plan & written consent will be obtained.    The option of not undergoing any procedures, or any  further testing, letter nature take its course was also discussed.  Patient and family would like to proceed with flexible sigmoidoscopy for now, to obtain a diagnosis, and this is reasonable.  Before discharge, primary team to please determine if Plavix needs to be continued, given her recent stroke in October 2018, and since she self stopped it without discussing it with anyone.  Continue serial CBCs and transfuse. Avoid NSAIDs Please page GI with any signs of active GI bleeding, hemodynamic changes, or any other reason for concern Clear liquid diet for rest of today NPO past midnight  Thank you for involving me in the care of this patient.      LOS: 0 days   Virgel Manifold, MD  03/03/2018, 9:55 AM

## 2018-03-03 NOTE — Care Management (Signed)
RNCM met with patient and her daughter Destiny [name] to discuss [place] letter. Patient is independent from home with a supportive family.  She has declined DME however she does not have it available at home.  Her PCP is Dr. Loney Hering. No anticipated needs.

## 2018-03-03 NOTE — Care Management Obs Status (Signed)
[facility] NOTIFICATION   Patient Details  Name: Destiny [name] MRN: [id number] Date of Birth: Mar 06, 1924   Medicare Observation Status Notification Given:  Yes    Marshell Garfinkel, RN 03/03/2018, 8:22 AM

## 2018-03-03 NOTE — Progress Notes (Addendum)
Initial Nutrition Assessment  DOCUMENTATION CODES:   Not applicable  INTERVENTION:   Premier Protein BID, each supplement provides 160 kcal and 30 grams of protein.   Liberalize diet  Bowel regimen per MD  NUTRITION DIAGNOSIS:   Increased nutrient needs related to acute illness as evidenced by increased estimated needs.  GOAL:   Patient will meet greater than or equal to 90% of their needs  MONITOR:   PO intake, Supplement acceptance, Labs, Weight trends, Skin, I & O's  REASON FOR ASSESSMENT:   Malnutrition Screening Tool    ASSESSMENT:   84 y.o. female with a known history of diabetes type 2, seasonal allergies, hyperlipidemia and previous stroke who is presenting to the emergency room with complaining of having bright red blood per rectum ongoing for the past few weeks.    Met with pt and pt's daughter in room today. Pt reports good appetite and oral intake at baseline but feels as though her appetite has been decreased over the past few weeks. Pt ate 95% of her lunch today. Pt reports that she does not drink supplements at home but she does take a "vegetable pill" that is a capsule that has dried vegetable powder. Pt's daughter is concerned that supplements contain "pure corn syrup" and are "too high in sugar". RD educated pt and pt's daughter today regarding the use of supplements and how to choose appropriate ones. Explained that supplements are beneficial in adding protein and preserving lean muscle. Pt would like to try the Premier Protein while in hospital. Per chart, pt is weight stable. RD will order supplements and liberalize diet. Pt noted to have type 2 stool; recommend bowel regimen per MD.    Medications reviewed and include: vitamin D, insulin  Labs reviewed: Na 134(L), Cl 99(L), Ca 8.5(L) cbgs- 113, 125, 97 x 24 hrs AIC 6.7(H)- 07/2017  NUTRITION - FOCUSED PHYSICAL EXAM:    Most Recent Value  Orbital Region  No depletion  Upper Arm Region  No depletion   Thoracic and Lumbar Region  No depletion  Buccal Region  No depletion  Temple Region  No depletion  Clavicle Bone Region  No depletion  Clavicle and Acromion Bone Region  No depletion  Scapular Bone Region  No depletion  Dorsal Hand  No depletion  Patellar Region  No depletion  Anterior Thigh Region  No depletion  Posterior Calf Region  No depletion  Edema (RD Assessment)  None  Hair  Reviewed  Eyes  Reviewed  Mouth  Reviewed  Skin  Reviewed  Nails  Reviewed     Diet Order:   Diet Order           Diet regular Room service appropriate? Yes; Fluid consistency: Thin  Diet effective now         EDUCATION NEEDS:   Education needs have been addressed  Skin:  Skin Assessment: Reviewed RN Assessment(skin tears )  Last BM:  5/21- type 2  Height:   Ht Readings from Last 1 Encounters:  03/02/18 5' (1.524 m)    Weight:   Wt Readings from Last 1 Encounters:  03/02/18 127 lb 13.9 oz (58 kg)    Ideal Body Weight:  45.4 kg  BMI:  Body mass index is 24.97 kg/m.  Estimated Nutritional Needs:   Kcal:  1200-1400kcal/day   Protein:  58-69g/day   Fluid:  >1.9L/day   Koleen Distance MS, RD, LDN Pager #- [phone] Office#- [phone] After Hours Pager: [phone]

## 2018-03-04 ENCOUNTER — Encounter: Payer: Self-pay | Admitting: *Deleted

## 2018-03-04 ENCOUNTER — Encounter
Admission: EM | Disposition: A | Discharge status: Home / Self Care | Payer: Self-pay | Source: Home / Self Care | Attending: Internal Medicine

## 2018-03-04 ENCOUNTER — Inpatient Hospital Stay: Payer: Medicare Other

## 2018-03-04 ENCOUNTER — Encounter: Payer: Self-pay | Admitting: Anesthesiology

## 2018-03-04 DIAGNOSIS — K922 Gastrointestinal hemorrhage, unspecified: Secondary | ICD-10-CM

## 2018-03-04 DIAGNOSIS — K629 Disease of anus and rectum, unspecified: Secondary | ICD-10-CM

## 2018-03-04 DIAGNOSIS — K6289 Other specified diseases of anus and rectum: Secondary | ICD-10-CM

## 2018-03-04 DIAGNOSIS — K921 Melena: Secondary | ICD-10-CM

## 2018-03-04 DIAGNOSIS — D490 Neoplasm of unspecified behavior of digestive system: Secondary | ICD-10-CM

## 2018-03-04 DIAGNOSIS — K648 Other hemorrhoids: Secondary | ICD-10-CM

## 2018-03-04 HISTORY — PX: FLEXIBLE SIGMOIDOSCOPY: SHX5431

## 2018-03-04 LAB — CBC
HCT: 33.5 % — ABNORMAL LOW (ref 35.0–47.0)
Hemoglobin: 11.6 g/dL — ABNORMAL LOW (ref 12.0–16.0)
MCH: 31.9 pg (ref 26.0–34.0)
MCHC: 34.7 g/dL (ref 32.0–36.0)
MCV: 91.9 fL (ref 80.0–100.0)
PLATELETS: 246 10*3/uL (ref 150–440)
RBC: 3.64 MIL/uL — ABNORMAL LOW (ref 3.80–5.20)
RDW: 13.8 % (ref 11.5–14.5)
WBC: 5.4 10*3/uL (ref 3.6–11.0)

## 2018-03-04 LAB — GLUCOSE, CAPILLARY
GLUCOSE-CAPILLARY: 128 mg/dL — AB (ref 65–99)
Glucose-Capillary: 119 mg/dL — ABNORMAL HIGH (ref 65–99)
Glucose-Capillary: 120 mg/dL — ABNORMAL HIGH (ref 65–99)

## 2018-03-04 SURGERY — SIGMOIDOSCOPY, FLEXIBLE
Laterality: Left

## 2018-03-04 MED ORDER — SODIUM CHLORIDE 0.9 % IV SOLN
INTRAVENOUS | Status: DC
  Administered 2018-03-04: 1000 mL via INTRAVENOUS

## 2018-03-04 MED ORDER — IOPAMIDOL (ISOVUE-300) INJECTION 61%
100.0000 mL | Freq: Once | INTRAVENOUS | Status: AC | PRN
  Administered 2018-03-04: 100 mL via INTRAVENOUS

## 2018-03-04 MED ORDER — LIDOCAINE HCL URETHRAL/MUCOSAL 2 % EX GEL
CUTANEOUS | Status: AC
  Filled 2018-03-04: qty 10

## 2018-03-04 NOTE — Progress Notes (Signed)
Chaplain was referred by nurse as patient was concerned about mass found. Pt was preparing to have biopsy . Daughter and son was in the room. Chaplain offered and gave prayer for healing.    03/04/18 1300  Clinical Encounter Type  Visited With Patient and family together  Visit Type Initial  Referral From Nurse  Spiritual Encounters  Spiritual Needs Prayer

## 2018-03-04 NOTE — Op Note (Signed)
[facility] Patient Name: Destiny [name] Procedure Date: 03/04/2018 4:01 PM MRN: [id number] Account #: [id number] Date of Birth: 23-Jan-1924 Admit Type: Inpatient Age: 84 Room: [facility] ENDO ROOM 4 Gender: Female Note Status: Finalized Procedure:            Flexible Sigmoidoscopy Indications:          Hematochezia Providers:            Brieanna Nau B. Bonna Gains MD, MD Referring MD:         Caprice Renshaw MD (Referring MD) Medicines:            None Complications:        No immediate complications. Procedure:            Pre-Anesthesia Assessment:                       - Prior to the procedure, a History and Physical was                        performed, and patient medications, allergies and                        sensitivities were reviewed. The patient's tolerance of                        previous anesthesia was reviewed.                       - The risks and benefits of the procedure and the                        sedation options and risks were discussed with the                        patient. All questions were answered and informed                        consent was obtained.                       - Patient identification and proposed procedure were                        verified prior to the procedure by the physician, the                        nurse and the technician. The procedure was verified in                        the pre-procedure area in the procedure room in the                        endoscopy suite.                       - ASA Grade Assessment: III - A patient with severe                        systemic disease.  After obtaining informed consent, the scope was passed                        under direct vision. The Colonoscope was introduced                        through the anus and advanced to the the sigmoid colon                        (Could not advanced to splenic flexure due to patient's        discomfort. In addition, diagnosis was made by this                        point of the exam). The flexible sigmoidoscopy was                        accomplished with ease. The patient tolerated the                        procedure well. The quality of the bowel preparation                        was adequate. Findings:      Hemorrhoids were found on perianal exam.      The digital rectal exam revealed a soft rectal mass.      An ulcerated non-obstructing large mass was found in the rectum. The       mass was partially circumferential (involving one-half of the lumen       circumference). The mass measured five cm in length. No bleeding was       present. Biopsies were taken with a cold forceps for histology.      Internal hemorrhoids were found during retroflexion. Impression:           - Hemorrhoids found on perianal exam.                       - Rectal mass. Source of patient's hematochezia.                       - Rule out malignancy, tumor in the rectum. Biopsied.                       - Internal hemorrhoids. Recommendation:       - Await pathology results.                       - Perform a CT scan (computed tomography) of chest with                        contrast, abdomen with contrast and pelvis with                        contrast, for evluation of metastasis today.                       - Refer to an oncologist today.                       - Continue Serial CBCs and transfuse PRN                       -  Patient has history of a stroke in Oct 2018 and has                        been off Plavix over the last month (on her own without                        discussing this with any provider). Colonoscopy with                        sedation has risks of changes in Heart rate and blood                        pressure, and I would like to avoid these risks if                        possible. Evaluation with Pan-CT for metastasis prior                        to proceeding with  colonoscopy, would allow [place] to see                        if a colonoscopy is even needed. If metastasis is                        present then a full colonoscopy would be futile. If no                        metastasis present, then benefits of a colonoscopy to                        evaluate for any other underlying colon lesions to                        determine management/surgical course, would outweigh                        risks.                       - The findings and recommendations were discussed with                        the patient.                       - The findings and recommendations were discussed with                        the patient's family.                       - Advance diet as tolerated today. Procedure Code(s):    --- Professional ---                       [phone], Sigmoidoscopy, flexible; with biopsy, single or                        multiple Diagnosis Code(s):    --- Professional ---  K64.8, Other hemorrhoids                       K62.89, Other specified diseases of anus and rectum                       D49.0, Neoplasm of unspecified behavior of digestive                        system                       K92.1, Melena (includes Hematochezia) CPT copyright 2017 American Medical Association. All rights reserved. The codes documented in this report are preliminary and upon coder review may  be revised to meet current compliance requirements.  Vonda Antigua, MD Margretta Sidle B. Bonna Gains MD, MD 03/04/2018 5:03:53 PM This report has been signed electronically. Number of Addenda: 0 Note Initiated On: 03/04/2018 4:01 PM Total Procedure Duration: 0 hours 10 minutes 15 seconds  Estimated Blood Loss: Estimated blood loss: none.      [facility]

## 2018-03-04 NOTE — Progress Notes (Signed)
   Destiny Antigua, MD [address] [place], [place], [place], [address] [address], [place], [place], [place], [address] Phone: [phone]  Fax: [phone]   Subjective:  Pt denies any abdominal pain, N/V. No BM since evaluation yesterday. No BRBPR today.  Objective: Exam: Vital signs in last 24 hours: Vitals:   03/03/18 1100 03/03/18 1639 03/03/18 2352 03/04/18 0747  BP: (!) 115/42 109/76 138/70 (!) 120/56  Pulse:  75 77 75  Resp:  18 18 18   Temp:  98.2 F (36.8 C) 97.7 F (36.5 C) 98.4 F (36.9 C)  TempSrc:  Oral Oral   SpO2:  97% 97% 94%  Weight:      Height:       Weight change:   Intake/Output Summary (Last 24 hours) at 03/04/2018 1159 Last data filed at 03/03/2018 1300 Gross per 24 hour  Intake 340 ml  Output -  Net 340 ml    General: No acute distress, AAO x3 Abd: Soft, NT/ND, No HSM Skin: Warm, no rashes Neck: Supple, Trachea midline   Lab Results: Lab Results  Component Value Date   WBC 5.4 03/04/2018   HGB 11.6 (L) 03/04/2018   HCT 33.5 (L) 03/04/2018   MCV 91.9 03/04/2018   PLT 246 03/04/2018   Micro Results: No results found for this or any previous visit (from the past 240 hour(s)). Studies/Results: Nm Gi Blood Loss  Result Date: 03/03/2018 CLINICAL DATA:  Gastrointestinal bleeding, bloody diarrhea for 1 week, remote appendectomy EXAM: NUCLEAR MEDICINE GASTROINTESTINAL BLEEDING SCAN TECHNIQUE: Sequential abdominal images were obtained following intravenous administration of Tc-83m labeled red blood cells. RADIOPHARMACEUTICALS:  22.68 mCi Tc-48m pertechnetate in-vitro labeled red cells. COMPARISON:  None FINDINGS: Normal blood pool distribution of labeled red cells. Small amount of de labeled tracer within the urinary bladder. No abnormal gastrointestinal localization of tracer identified to suggest active GI bleeding. IMPRESSION: Negative GI bleeding study. Electronically Signed   By: Lavonia Dana M.D.   On: 03/03/2018 12:26    Medications:  Scheduled Meds: . amLODipine  2.5 mg Oral Daily  . cholecalciferol  1,000 Units Oral Daily  . doxepin  10 mg Oral QHS  . fluticasone  2 spray Each Nare Daily  . insulin aspart  0-9 Units Subcutaneous TID WC  . irbesartan  75 mg Oral Daily  . protein supplement shake  11 oz Oral BID BM   Continuous Infusions: . sodium chloride 50 mL/hr at 03/04/18 1144   PRN Meds:.acetaminophen **OR** acetaminophen, ondansetron **OR** ondansetron (ZOFRAN) IV, zolpidem   Assessment: Active Problems:   Lower GI bleed   GI bleed    Plan: Proceed with Flex sig today to evaluate hematochezia and possible rectal mass Continue NPO Continue serial CBCs and transfuse PRN No hemodynamic changes since admission Hgb stable    LOS: 1 day   Destiny Antigua, MD 03/04/2018, 11:59 AM

## 2018-03-04 NOTE — Progress Notes (Signed)
[facility] NAME: Glinda Natzke    MR#:  [id number]  DATE OF BIRTH:  01/05/1924  SUBJECTIVE:  Patient seen and evaluated today No new episodes of bloody bowel movement No chest pain No shortness of breath   REVIEW OF SYSTEMS:    ROS  CONSTITUTIONAL: No documented fever. No fatigue, weakness. No weight gain, no weight loss.  EYES: No blurry or double vision.  ENT: No tinnitus. No postnasal drip. No redness of the oropharynx.  RESPIRATORY: No cough, no wheeze, no hemoptysis. No dyspnea.  CARDIOVASCULAR: No chest pain. No orthopnea. No palpitations. No syncope.  GASTROINTESTINAL: No nausea, no vomiting or diarrhea. No abdominal pain. No melena or hematochezia.  GENITOURINARY: No dysuria or hematuria.  ENDOCRINE: No polyuria or nocturia. No heat or cold intolerance.  HEMATOLOGY: No anemia. No bruising. No bleeding.  INTEGUMENTARY: No rashes. No lesions.  MUSCULOSKELETAL: No arthritis. No swelling. No gout.  NEUROLOGIC: No numbness, tingling, or ataxia. No seizure-type activity.  PSYCHIATRIC: No anxiety. No insomnia. No ADD.   DRUG ALLERGIES:   Allergies  Allergen Reactions  . Penicillins     Has patient had a PCN reaction causing immediate rash, facial/tongue/throat swelling, SOB or lightheadedness with hypotension: Unknown Has patient had a PCN reaction causing severe rash involving mucus membranes or skin necrosis: Unknown Has patient had a PCN reaction that required hospitalization: Unknown Has patient had a PCN reaction occurring within the last 10 years: No If all of the above answers are "NO", then may proceed with Cephalosporin use.   . Ampicillin Rash  . Hydrocodone-Homatropine     Other reaction(s): Other (See Comments) tachycardia  . Amoxicillin Rash  . Tramadol Itching    VITALS:  Blood pressure (!) 153/64, pulse 74, temperature (!) 96.8 F (36 C), temperature source Tympanic, resp. rate 20, height 5' (1.524  m), weight 58 kg (127 lb 13.9 oz), SpO2 97 %.  PHYSICAL EXAMINATION:   Physical Exam  GENERAL:  84 y.o.-year-old patient lying in the bed with no acute distress.  EYES: Pupils equal, round, reactive to light and accommodation. No scleral icterus. Extraocular muscles intact.  HEENT: Head atraumatic, normocephalic. Oropharynx and nasopharynx clear.  NECK:  Supple, no jugular venous distention. No thyroid enlargement, no tenderness.  LUNGS: Normal breath sounds bilaterally, no wheezing, rales, rhonchi. No use of accessory muscles of respiration.  CARDIOVASCULAR: S1, S2 normal. No murmurs, rubs, or gallops.  ABDOMEN: Soft, nontender, nondistended. Bowel sounds present. No organomegaly or mass.  EXTREMITIES: No cyanosis, clubbing or edema b/l.    NEUROLOGIC: Cranial nerves II through XII are intact. No focal Motor or sensory deficits b/l.   PSYCHIATRIC: The patient is alert and oriented x 3.  SKIN: Small skin tear over the left lower extremity  LABORATORY PANEL:   CBC Recent Labs  Lab 03/04/18 0422  WBC 5.4  HGB 11.6*  HCT 33.5*  PLT 246   ------------------------------------------------------------------------------------------------------------------ Chemistries  Recent Labs  Lab 03/02/18 1621 03/03/18 0431  NA 129* 134*  K 3.9 3.6  CL 92* 99*  CO2 31 30  GLUCOSE 114* 117*  BUN 20 14  CREATININE 1.36* 0.81  CALCIUM 9.2 8.5*  AST 22  --   ALT 10*  --   ALKPHOS 57  --   BILITOT 0.5  --    ------------------------------------------------------------------------------------------------------------------  Cardiac Enzymes No results for input(s): TROPONINI in the last 168 hours. ------------------------------------------------------------------------------------------------------------------  RADIOLOGY:  Nm Gi Blood Loss  Result Date: 03/03/2018  CLINICAL DATA:  Gastrointestinal bleeding, bloody diarrhea for 1 week, remote appendectomy EXAM: NUCLEAR MEDICINE  GASTROINTESTINAL BLEEDING SCAN TECHNIQUE: Sequential abdominal images were obtained following intravenous administration of Tc-78m labeled red blood cells. RADIOPHARMACEUTICALS:  22.68 mCi Tc-44m pertechnetate in-vitro labeled red cells. COMPARISON:  None FINDINGS: Normal blood pool distribution of labeled red cells. Small amount of de labeled tracer within the urinary bladder. No abnormal gastrointestinal localization of tracer identified to suggest active GI bleeding. IMPRESSION: Negative GI bleeding study. Electronically Signed   By: Lavonia Dana M.D.   On: 03/03/2018 12:26     ASSESSMENT AND PLAN:  84 year old elderly female patient with history of diabetes mellitus type 2, hyperlipidemia, CVA, shingles presented to the emergency room with rectal bleed  -Gastrointestinal bleeding Status post gastroenterology evaluation Rectal exam revealed mass Sigmoidoscopy and biopsy today by gastroenterology Hemoglobin stable so far  -Diabetes mellitus type 2 Currently on clear liquid diet Sliding scale coverage with insulin as needed  -Aspirin on hold  -DVT prophylaxis sequential compression device to lower extremities  -Hypertension continue Norvasc  All the records are reviewed and case discussed with Care Management/Social Worker. Management plans discussed with the patient, family and they are in agreement.  CODE STATUS: Full code  DVT Prophylaxis: SCDs  TOTAL TIME TAKING CARE OF THIS PATIENT: 34 minutes.   POSSIBLE D/C IN 2 to 3 DAYS, DEPENDING ON CLINICAL CONDITION.  Saundra Shelling M.D on 03/04/2018 at 2:23 PM  Between 7am to 6pm - Pager - [phone]  After 6pm go to [email or web address] - password [email or web address]  Office  [phone]  CC: Primary care physician; Derinda Late, MD  Note: This dictation was prepared with Dragon dictation along with smaller phrase technology. Any transcriptional errors that result from this process are unintentional.

## 2018-03-04 NOTE — Progress Notes (Signed)
New orders received from Dr. Estanislado Pandy to dress skin tear to left lower leg. Orders noted and carried out. Patient updated and allowed dressing change. Endorsed to primary nurse Raquel Sarna.

## 2018-03-05 ENCOUNTER — Telehealth: Payer: Self-pay | Admitting: Gastroenterology

## 2018-03-05 DIAGNOSIS — Z7982 Long term (current) use of aspirin: Secondary | ICD-10-CM

## 2018-03-05 DIAGNOSIS — Z79899 Other long term (current) drug therapy: Secondary | ICD-10-CM

## 2018-03-05 DIAGNOSIS — Z7984 Long term (current) use of oral hypoglycemic drugs: Secondary | ICD-10-CM

## 2018-03-05 DIAGNOSIS — E119 Type 2 diabetes mellitus without complications: Secondary | ICD-10-CM

## 2018-03-05 DIAGNOSIS — D500 Iron deficiency anemia secondary to blood loss (chronic): Secondary | ICD-10-CM

## 2018-03-05 LAB — BASIC METABOLIC PANEL
ANION GAP: 6 (ref 5–15)
BUN: 14 mg/dL (ref 6–20)
CHLORIDE: 102 mmol/L (ref 101–111)
CO2: 29 mmol/L (ref 22–32)
Calcium: 8.5 mg/dL — ABNORMAL LOW (ref 8.9–10.3)
Creatinine, Ser: 0.82 mg/dL (ref 0.44–1.00)
GLUCOSE: 110 mg/dL — AB (ref 65–99)
POTASSIUM: 3.6 mmol/L (ref 3.5–5.1)
Sodium: 137 mmol/L (ref 135–145)

## 2018-03-05 LAB — CBC
HCT: 32.1 % — ABNORMAL LOW (ref 35.0–47.0)
HEMOGLOBIN: 11 g/dL — AB (ref 12.0–16.0)
MCH: 31.5 pg (ref 26.0–34.0)
MCHC: 34.1 g/dL (ref 32.0–36.0)
MCV: 92.3 fL (ref 80.0–100.0)
Platelets: 240 10*3/uL (ref 150–440)
RBC: 3.48 MIL/uL — AB (ref 3.80–5.20)
RDW: 13.9 % (ref 11.5–14.5)
WBC: 7.6 10*3/uL (ref 3.6–11.0)

## 2018-03-05 LAB — IRON AND TIBC
Iron: 33 ug/dL (ref 28–170)
Saturation Ratios: 12 % (ref 10.4–31.8)
TIBC: 281 ug/dL (ref 250–450)
UIBC: 248 ug/dL

## 2018-03-05 LAB — FERRITIN: Ferritin: 32 ng/mL (ref 11–307)

## 2018-03-05 MED ORDER — BISACODYL 5 MG PO TBEC
10.0000 mg | DELAYED_RELEASE_TABLET | Freq: Once | ORAL | Status: AC
  Administered 2018-03-05: 10 mg via ORAL
  Filled 2018-03-05: qty 2

## 2018-03-05 MED ORDER — PEG 3350-KCL-NA BICARB-NACL 420 G PO SOLR
4000.0000 mL | Freq: Once | ORAL | Status: AC
  Administered 2018-03-05: 4000 mL via ORAL
  Filled 2018-03-05: qty 4000

## 2018-03-05 MED ORDER — SODIUM CHLORIDE 0.9 % IV SOLN
INTRAVENOUS | Status: DC
  Administered 2018-03-06: 20 mL/h via INTRAVENOUS
  Administered 2018-03-06: 15:00:00 via INTRAVENOUS

## 2018-03-05 MED ORDER — FERROUS SULFATE 325 (65 FE) MG PO TABS
325.0000 mg | ORAL_TABLET | Freq: Two times a day (BID) | ORAL | Status: DC
  Administered 2018-03-05: 325 mg via ORAL
  Filled 2018-03-05 (×2): qty 1

## 2018-03-05 NOTE — Telephone Encounter (Signed)
Destiny [name] with [facility] left vm for Dr. Bonna Gains. She ordered a STAT sample but they did not receive it until this morning they have marked it STAT but not sure when she will receive it

## 2018-03-05 NOTE — Progress Notes (Signed)
Destiny Antigua, MD [address], [place], [place], [place], [address] [id number], [place], [place], [place], [address] Phone: [phone]  Fax: [phone]   Subjective: Patient reports one bowel movement, where she stopped blood in the toilet paper when she wiped.  No hematemesis.  No hemodynamic changes.  No abdominal pain.  No nausea or vomiting.   Objective: Exam: Vital signs in last 24 hours: Vitals:   03/04/18 1620 03/04/18 1623 03/05/18 0012 03/05/18 0818  BP: (!) 170/79 (!) 155/93 (!) 112/36 139/67  Pulse: 92 94 77 76  Resp:  11 16 14   Temp:   98.6 F (37 C) 97.6 F (36.4 C)  TempSrc:   Oral Oral  SpO2: 100% 100% 96% 98%  Weight:      Height:       Weight change:   Intake/Output Summary (Last 24 hours) at 03/05/2018 1612 Last data filed at 03/05/2018 1415 Gross per 24 hour  Intake 420 ml  Output -  Net 420 ml    General: No acute distress, AAO x3 Abd: Soft, NT/ND, No HSM Skin: Warm, no rashes Neck: Supple, Trachea midline   Lab Results: Lab Results  Component Value Date   WBC 7.6 03/05/2018   HGB 11.0 (L) 03/05/2018   HCT 32.1 (L) 03/05/2018   MCV 92.3 03/05/2018   PLT 240 03/05/2018   Micro Results: No results found for this or any previous visit (from the past 240 hour(s)). Studies/Results: Ct Chest W Contrast  Result Date: 03/04/2018 CLINICAL DATA:  84 year old female with rectal bleeding. Patient had a sigmoidoscopy to confirm a nonobstructing large rectal mass per rectal exam. EXAM: CT CHEST, ABDOMEN, AND PELVIS WITH CONTRAST TECHNIQUE: Multidetector CT imaging of the chest, abdomen and pelvis was performed following the standard protocol during bolus administration of intravenous contrast. CONTRAST:  122mL ISOVUE-300 IOPAMIDOL (ISOVUE-300) INJECTION 61% COMPARISON:  None. FINDINGS: CT CHEST FINDINGS Cardiovascular: Patent great vessels. Mild to moderate aortic atherosclerosis with ectasia of the ascending aorta to 3.6 cm. No large  central pulmonary embolus. Heart is normal in size without pericardial effusion or thickening. Left main and three-vessel coronary arteriosclerosis is identified. Mediastinum/Nodes: No enlarged mediastinal, hilar, or axillary lymph nodes. Thyroid gland, trachea, and esophagus demonstrate no significant findings. Lungs/Pleura: No pleural effusion, consolidation, pneumothorax or dominant mass. Musculoskeletal: Thoracic spondylosis. No aggressive osseous lesions. No acute fracture. CT ABDOMEN PELVIS FINDINGS Hepatobiliary: A 5 mm rounded hypodensity adjacent to the gallbladder fossa is identified, too small to further characterize, series 2, image 49. No additional lesions are noted. No enhancing abnormalities are seen. Punctate calcification along the dependent aspect of the gallbladder. No secondary signs of acute cholecystitis. Pancreas: Atrophic pancreas with calcifications compatible with stigmata of chronic pancreatitis. No pancreatic inflammation. Spleen: Normal Adrenals/Urinary Tract: Normal bilateral adrenal glands. Bilateral renal hypodensities statistically consistent with cysts, the largest on the right is in the lower pole anteriorly measuring 11 mm and on the left measuring 16 mm in the upper pole. No nephrolithiasis nor hydroureteronephrosis. The urinary bladder is decompressed. Stomach/Bowel: Eccentric mural thickening along the posterior wall of the rectum. No localized adenopathy. Scattered colonic diverticulosis is identified more prominently affecting the sigmoid colon with circular muscle hypertrophy. No bowel obstruction is seen. The stomach and small intestine are unremarkable. Vascular/Lymphatic: Moderate aortoiliac atherosclerosis. No retroperitoneal, mesenteric or pelvic sidewall adenopathy. No inguinal adenopathy. Reproductive: Uterus and bilateral adnexa are unremarkable. Other: No free air nor free fluid. Small fat containing umbilical hernia. Musculoskeletal: No aggressive osteolytic or  blastic disease. Lumbar spondylosis with multilevel degenerative disc and facet arthropathy. IMPRESSION: 1. Eccentric mural thickening along the posterior wall of the rectum may correspond with the reported rectal carcinoma. No local or definite metastatic disease. There is a 5 mm hypodensity adjacent to the gallbladder fossa too small to further characterize, felt to represent a small cyst but can be further correlated with dedicated liver protocol CT or MRI. 2. Bilateral renal cysts some which are too small to further characterize as well. The largest on the right is approximately 11 mm and on the left 16 mm. 3. Left main and three-vessel coronary arteriosclerosis. Ectasia of the ascending thoracic aorta to 3.6 cm. Electronically Signed   By: Ashley Royalty M.D.   On: 03/04/2018 19:39   Ct Abdomen Pelvis W Contrast  Result Date: 03/04/2018 CLINICAL DATA:  84 year old female with rectal bleeding. Patient had a sigmoidoscopy to confirm a nonobstructing large rectal mass per rectal exam. EXAM: CT CHEST, ABDOMEN, AND PELVIS WITH CONTRAST TECHNIQUE: Multidetector CT imaging of the chest, abdomen and pelvis was performed following the standard protocol during bolus administration of intravenous contrast. CONTRAST:  176mL ISOVUE-300 IOPAMIDOL (ISOVUE-300) INJECTION 61% COMPARISON:  None. FINDINGS: CT CHEST FINDINGS Cardiovascular: Patent great vessels. Mild to moderate aortic atherosclerosis with ectasia of the ascending aorta to 3.6 cm. No large central pulmonary embolus. Heart is normal in size without pericardial effusion or thickening. Left main and three-vessel coronary arteriosclerosis is identified. Mediastinum/Nodes: No enlarged mediastinal, hilar, or axillary lymph nodes. Thyroid gland, trachea, and esophagus demonstrate no significant findings. Lungs/Pleura: No pleural effusion, consolidation, pneumothorax or dominant mass. Musculoskeletal: Thoracic spondylosis. No aggressive osseous lesions. No acute  fracture. CT ABDOMEN PELVIS FINDINGS Hepatobiliary: A 5 mm rounded hypodensity adjacent to the gallbladder fossa is identified, too small to further characterize, series 2, image 49. No additional lesions are noted. No enhancing abnormalities are seen. Punctate calcification along the dependent aspect of the gallbladder. No secondary signs of acute cholecystitis. Pancreas: Atrophic pancreas with calcifications compatible with stigmata of chronic pancreatitis. No pancreatic inflammation. Spleen: Normal Adrenals/Urinary Tract: Normal bilateral adrenal glands. Bilateral renal hypodensities statistically consistent with cysts, the largest on the right is in the lower pole anteriorly measuring 11 mm and on the left measuring 16 mm in the upper pole. No nephrolithiasis nor hydroureteronephrosis. The urinary bladder is decompressed. Stomach/Bowel: Eccentric mural thickening along the posterior wall of the rectum. No localized adenopathy. Scattered colonic diverticulosis is identified more prominently affecting the sigmoid colon with circular muscle hypertrophy. No bowel obstruction is seen. The stomach and small intestine are unremarkable. Vascular/Lymphatic: Moderate aortoiliac atherosclerosis. No retroperitoneal, mesenteric or pelvic sidewall adenopathy. No inguinal adenopathy. Reproductive: Uterus and bilateral adnexa are unremarkable. Other: No free air nor free fluid. Small fat containing umbilical hernia. Musculoskeletal: No aggressive osteolytic or blastic disease. Lumbar spondylosis with multilevel degenerative disc and facet arthropathy. IMPRESSION: 1. Eccentric mural thickening along the posterior wall of the rectum may correspond with the reported rectal carcinoma. No local or definite metastatic disease. There is a 5 mm hypodensity adjacent to the gallbladder fossa too small to further characterize, felt to represent a small cyst but can be further correlated with dedicated liver protocol CT or MRI. 2.  Bilateral renal cysts some which are too small to further characterize as well. The largest on the right is approximately 11 mm and on the left 16 mm. 3. Left main and three-vessel coronary arteriosclerosis. Ectasia of the ascending thoracic aorta to 3.6 cm. Electronically Signed   By:  Ashley Royalty M.D.   On: 03/04/2018 19:39   Medications:  Scheduled Meds: . amLODipine  2.5 mg Oral Daily  . bisacodyl  10 mg Oral Once  . cholecalciferol  1,000 Units Oral Daily  . doxepin  10 mg Oral QHS  . ferrous sulfate  325 mg Oral BID WC  . fluticasone  2 spray Each Nare Daily  . irbesartan  75 mg Oral Daily  . polyethylene glycol-electrolytes  4,000 mL Oral Once  . protein supplement shake  11 oz Oral BID BM   Continuous Infusions: PRN Meds:.acetaminophen **OR** acetaminophen, ondansetron **OR** ondansetron (ZOFRAN) IV, zolpidem   Assessment: Active Problems:   Lower GI bleed   GI bleed   Internal hemorrhoids   Nodule of rectum   Intestinal neoplasm   Hematochezia    Plan: CT done yesterday did not show any evidence of metastases Oncology saw the patient today, oncology note pending Since no evidence of metastasis present, patient may be a surgical candidate for her rectal mass.  However, prior to surgery, evaluation for any synchronous lesions in the colon is needed.  We will thus plan for colonoscopy tomorrow to rule out any other lesions.  Benefits of procedure would outweigh risks after above work-up Patient encouraged to hydrate well with the prep today Above plan discussed in detail with patient and family and they are agreeable with I have discussed alternative options, risks & benefits,  which include, but are not limited to, bleeding, infection, perforation,respiratory complication & drug reaction.  The patient agrees with this plan & written consent will be obtained.   Patient will need surgical evaluation, recommend surgery consult Continue CBCs at least once daily and transfuse  PRN.  Increase CBC check, patient has frequent hematochezia.  Currently hematochezia is mild and only on toilet paper.   LOS: 2 days   Destiny Antigua, MD 03/05/2018, 4:12 PM

## 2018-03-05 NOTE — Consult Note (Signed)
[facility]  Date of admission:  03/02/2018  Inpatient day:  03/05/2018  Consulting physician: Dr Reatha Harps Pyreddy  Reason for Consultation:  Rectal mass s/p biopsy   Chief Complaint: Destiny [name] is a 84 y.o. female with rectal bleeding who was admitted through the emergency room.  HPI: The patient states that her last colonoscopy was > 25 years ago.  For the past 2-3 months, she has had bright red blood per rectum.  She notes that with every bowel movement, there is bleeding.  The amount of bleeding varies.  Recently stool consistency has changed to loose/diarrhea. Appetite has decreased.  She has lost approximately 8 pounds.  Because of increased bruising, the patient stopped her Plavix.  She presented to the [facility] ER with increased rectal bleeding.  CBC revealed a hematocrit of 35.9, hemoglobin 12.3, and MCV 92.4.  Prior CBC on 02/02/2018 revealed a hematocrit of 39.4 and hemoglobin 13.3.  Nuclear medicine bleeding scan on 03/03/2018 was negative.  She was seen in consultation by Dr. Vonda Antigua.  Rectal exam revealed a soft rectal mass.  Flexible sigmoidoscopy on 03/04/2018 revealed an ulcerated non-obstructing large mass in the rectum.  The mass was partially circumferential involving 1/2 of the lumen circumference.  Mass was 5 cm in length.  Biopsies are pending.  Chest, abdomen, and pelvic CT on 03/04/2018 revealed eccentric mural thickening along the posterior wall of the rectum which may correspond with the reported rectal carcinoma.  There was no local or definite metastatic disease. There was a 5 mm hypodensity adjacent to the gallbladder fossa too small to further characterize, felt to represent a small cyst but can be further correlated with dedicated liver protocol CT or MRI.  There were bilateral renal cysts   Symptomatically, she feels fine.   Past Medical History:  Diagnosis Date  . Allergy   . Diabetes mellitus without complication ([place])   .  Hyperlipidemia   . Shingles   . Stroke [facility])     Past Surgical History:  Procedure Laterality Date  . APPENDECTOMY    . BACK SURGERY    . FOOT SURGERY Bilateral    [place]  . JOINT REPLACEMENT    . KNEE SURGERY Bilateral   . TONSILECTOMY, ADENOIDECTOMY, BILATERAL MYRINGOTOMY AND TUBES      Family History  Problem Relation Age of Onset  . Stroke Mother   . Stroke Father     Social History:  reports that she has never smoked. She has never used smokeless tobacco. She reports that she does not drink alcohol or use drugs.  She lives alone, although her youngest grandson has recently been staying with her.  She is very active and "keeps her own house".  She is accompanied by her daughter, Destiny [name], who is her medical power of attorney.  Allergies:  Allergies  Allergen Reactions  . Penicillins     Has patient had a PCN reaction causing immediate rash, facial/tongue/throat swelling, SOB or lightheadedness with hypotension: Unknown Has patient had a PCN reaction causing severe rash involving mucus membranes or skin necrosis: Unknown Has patient had a PCN reaction that required hospitalization: Unknown Has patient had a PCN reaction occurring within the last 10 years: No If all of the above answers are "NO", then may proceed with Cephalosporin use.   . Ampicillin Rash  . Hydrocodone-Homatropine     Other reaction(s): Other (See Comments) tachycardia  . Amoxicillin Rash  . Tramadol Itching    Medications Prior to Admission  Medication Sig Dispense Refill  . amLODipine (NORVASC) 2.5 MG tablet Take 1 tablet (2.5 mg total) by mouth daily. Take once daily if blood pressure > 160 14 tablet 0  . aspirin EC 81 MG tablet Take 1 tablet (81 mg total) by mouth daily. 100 tablet 0  . butalbital-acetaminophen-caffeine (FIORICET, ESGIC) 50-325-40 MG tablet Take 1 tablet by mouth every 4 (four) hours as needed for headache.    . cholecalciferol (VITAMIN D) 1000 units tablet Take 1,000 Units by  mouth daily.    Marland Kitchen doxepin (SINEQUAN) 10 MG capsule TAKE ONE CAPSULE BY MOUTH EVERY NIGHT AT BEDTIME    . fluticasone (FLONASE) 50 MCG/ACT nasal spray Place 2 sprays into both nostrils daily.    . hydrochlorothiazide (HYDRODIURIL) 12.5 MG tablet Take 1 tablet (12.5 mg total) by mouth daily. 7 tablet 0  . ibuprofen (ADVIL,MOTRIN) 200 MG tablet Take 400 mg by mouth at bedtime as needed for mild pain.    . metFORMIN (GLUCOPHAGE) 500 MG tablet TAKE ONE (1) TABLET BY MOUTH EVERY DAY    . telmisartan (MICARDIS) 80 MG tablet Take 1 tablet by mouth daily.    . vitamin B-12 (CYANOCOBALAMIN) 1000 MCG tablet Take 1,000 mcg by mouth daily.      Review of Systems: GENERAL:  Feels "fine".  No fevers, sweats.  Weight loss of 8 pounds over past few months. PERFORMANCE STATUS (ECOG):  1 HEENT:  No visual changes, runny nose, sore throat, mouth sores or tenderness. Lungs: No shortness of breath or cough.  No hemoptysis. Cardiac:  No chest pain, palpitations, orthopnea, or PND. GI:  Decreased appetite.  Loose stools.  Hematochezia.  No nausea, vomiting, diarrhea, constipation, or melena. GU:  No urgency, frequency, dysuria, or hematuria. Musculoskeletal:  Chronic back pain for which she receives injections in the pain clinic.  No joint pain.  No muscle tenderness. Extremities:  No pain or swelling. Skin:  No rashes or skin changes. Neuro:  Occasional headache.  No numbness or weakness, balance or coordination issues. Endocrine:  Diabetes.  No thyroid issues, hot flashes or night sweats. Psych:  No mood changes, depression or anxiety. Pain:  No focal pain. Review of systems:  All other systems reviewed and found to be negative.  H/o mild CVA 07/29/2017 (no residual deficits).  Physical Exam:  Blood pressure 139/67, pulse 76, temperature 97.6 F (36.4 C), temperature source Oral, resp. rate 14, height 5' (1.524 m), weight 127 lb 13.9 oz (58 kg), SpO2 98 %.  GENERAL:  Well developed, well nourished,elderly  woman sitting comfortably on the medical unit in a lounge chair in no acute distress. MENTAL STATUS:  Alert and oriented to person, place and time. HEAD:  Short styled gray wavy hair.  Normocephalic, atraumatic, face symmetric, no Cushingoid features. EYES:  Glasses.  Blue eyes.  Arcus.  Pupils equal round and reactive to light and accomodation.  No conjunctivitis or scleral icterus. ENT:  Oropharynx clear without lesion.  Tongue normal. Mucous membranes moist.  RESPIRATORY:  Clear to auscultation without rales, wheezes or rhonchi. CARDIOVASCULAR:  Regular rate and rhythm without murmur, rub or gallop. ABDOMEN:  Soft, non-tender, with active bowel sounds, and no hepatosplenomegaly.  No masses. SKIN:  No rashes, ulcers or lesions. EXTREMITIES: Left lower leg wrapped s/p trauma.  No edema, no skin discoloration or tenderness.  No palpable cords. LYMPH NODES: No palpable cervical, supraclavicular, axillary or inguinal adenopathy  NEUROLOGICAL: Unremarkable. PSYCH:  Appropriate.   Results for orders placed or performed during the  hospital encounter of 03/02/18 (from the past 48 hour(s))  Glucose, capillary     Status: None   Collection Time: 03/03/18 12:45 PM  Result Value Ref Range   Glucose-Capillary 97 65 - 99 mg/dL  Glucose, capillary     Status: Abnormal   Collection Time: 03/03/18  4:34 PM  Result Value Ref Range   Glucose-Capillary 108 (H) 65 - 99 mg/dL  Glucose, capillary     Status: Abnormal   Collection Time: 03/03/18  9:17 PM  Result Value Ref Range   Glucose-Capillary 116 (H) 65 - 99 mg/dL  CBC     Status: Abnormal   Collection Time: 03/04/18  4:22 AM  Result Value Ref Range   WBC 5.4 3.6 - 11.0 K/uL   RBC 3.64 (L) 3.80 - 5.20 MIL/uL   Hemoglobin 11.6 (L) 12.0 - 16.0 g/dL   HCT 33.5 (L) 35.0 - 47.0 %   MCV 91.9 80.0 - 100.0 fL   MCH 31.9 26.0 - 34.0 pg   MCHC 34.7 32.0 - 36.0 g/dL   RDW 13.8 11.5 - 14.5 %   Platelets 246 150 - 440 K/uL    Comment: Performed at [facility], [address] [place], [place] [address]  Glucose, capillary     Status: Abnormal   Collection Time: 03/04/18  7:49 AM  Result Value Ref Range   Glucose-Capillary 119 (H) 65 - 99 mg/dL   Comment 1 Notify RN   Glucose, capillary     Status: Abnormal   Collection Time: 03/04/18 12:05 PM  Result Value Ref Range   Glucose-Capillary 128 (H) 65 - 99 mg/dL   Comment 1 Notify RN   Glucose, capillary     Status: Abnormal   Collection Time: 03/04/18  1:25 PM  Result Value Ref Range   Glucose-Capillary 120 (H) 65 - 99 mg/dL  CBC     Status: Abnormal   Collection Time: 03/05/18  3:26 AM  Result Value Ref Range   WBC 7.6 3.6 - 11.0 K/uL   RBC 3.48 (L) 3.80 - 5.20 MIL/uL   Hemoglobin 11.0 (L) 12.0 - 16.0 g/dL   HCT 32.1 (L) 35.0 - 47.0 %   MCV 92.3 80.0 - 100.0 fL   MCH 31.5 26.0 - 34.0 pg   MCHC 34.1 32.0 - 36.0 g/dL   RDW 13.9 11.5 - 14.5 %   Platelets 240 150 - 440 K/uL    Comment: Performed at [facility], [address] [place], [place] [address]  Basic metabolic panel     Status: Abnormal   Collection Time: 03/05/18  3:26 AM  Result Value Ref Range   Sodium 137 135 - 145 mmol/L   Potassium 3.6 3.5 - 5.1 mmol/L   Chloride 102 101 - 111 mmol/L   CO2 29 22 - 32 mmol/L   Glucose, Bld 110 (H) 65 - 99 mg/dL   BUN 14 6 - 20 mg/dL   Creatinine, Ser 0.82 0.44 - 1.00 mg/dL   Calcium 8.5 (L) 8.9 - 10.3 mg/dL   GFR calc non Af Amer >60 >60 mL/min   GFR calc Af Amer >60 >60 mL/min    Comment: (NOTE) The eGFR has been calculated using the CKD EPI equation. This calculation has not been validated in all clinical situations. eGFR's persistently <60 mL/min signify possible Chronic Kidney Disease.    Anion gap 6 5 - 15    Comment: Performed at [facility], [address] [place], [place] [address]  Ferritin     Status: None   Collection Time: 03/05/18  3:26 AM  Result Value Ref Range   Ferritin 32 11 - 307 ng/mL    Comment: Performed at [facility], [place], [place] [address]  Iron and TIBC     Status: None   Collection Time: 03/05/18  3:26 AM  Result Value Ref Range   Iron 33 28 - 170 ug/dL   TIBC 281 250 - 450 ug/dL   Saturation Ratios 12 10.4 - 31.8 %   UIBC 248 ug/dL    Comment: Performed at [facility], [address] [place], [place] [address]   Ct Chest W Contrast  Result Date: 03/04/2018 CLINICAL DATA:  84 year old female with rectal bleeding. Patient had a sigmoidoscopy to confirm a nonobstructing large rectal mass per rectal exam. EXAM: CT CHEST, ABDOMEN, AND PELVIS WITH CONTRAST TECHNIQUE: Multidetector CT imaging of the chest, abdomen and pelvis was performed following the standard protocol during bolus administration of intravenous contrast. CONTRAST:  131m ISOVUE-300 IOPAMIDOL (ISOVUE-300) INJECTION 61% COMPARISON:  None. FINDINGS: CT CHEST FINDINGS Cardiovascular: Patent great vessels. Mild to moderate aortic atherosclerosis with ectasia of the ascending aorta to 3.6 cm. No large central pulmonary embolus. Heart is normal in size without pericardial effusion or thickening. Left main and three-vessel coronary arteriosclerosis is identified. Mediastinum/Nodes: No enlarged mediastinal, hilar, or axillary lymph nodes. Thyroid gland, trachea, and esophagus demonstrate no significant findings. Lungs/Pleura: No pleural effusion, consolidation, pneumothorax or dominant mass. Musculoskeletal: Thoracic spondylosis. No aggressive osseous lesions. No acute fracture. CT ABDOMEN PELVIS FINDINGS Hepatobiliary: A 5 mm rounded hypodensity adjacent to the gallbladder fossa is identified, too small to further characterize, series 2, image 49. No additional lesions are noted. No enhancing abnormalities are seen. Punctate calcification along the dependent aspect of the gallbladder. No secondary signs of acute cholecystitis. Pancreas: Atrophic pancreas with calcifications compatible with stigmata of chronic  pancreatitis. No pancreatic inflammation. Spleen: Normal Adrenals/Urinary Tract: Normal bilateral adrenal glands. Bilateral renal hypodensities statistically consistent with cysts, the largest on the right is in the lower pole anteriorly measuring 11 mm and on the left measuring 16 mm in the upper pole. No nephrolithiasis nor hydroureteronephrosis. The urinary bladder is decompressed. Stomach/Bowel: Eccentric mural thickening along the posterior wall of the rectum. No localized adenopathy. Scattered colonic diverticulosis is identified more prominently affecting the sigmoid colon with circular muscle hypertrophy. No bowel obstruction is seen. The stomach and small intestine are unremarkable. Vascular/Lymphatic: Moderate aortoiliac atherosclerosis. No retroperitoneal, mesenteric or pelvic sidewall adenopathy. No inguinal adenopathy. Reproductive: Uterus and bilateral adnexa are unremarkable. Other: No free air nor free fluid. Small fat containing umbilical hernia. Musculoskeletal: No aggressive osteolytic or blastic disease. Lumbar spondylosis with multilevel degenerative disc and facet arthropathy. IMPRESSION: 1. Eccentric mural thickening along the posterior wall of the rectum may correspond with the reported rectal carcinoma. No local or definite metastatic disease. There is a 5 mm hypodensity adjacent to the gallbladder fossa too small to further characterize, felt to represent a small cyst but can be further correlated with dedicated liver protocol CT or MRI. 2. Bilateral renal cysts some which are too small to further characterize as well. The largest on the right is approximately 11 mm and on the left 16 mm. 3. Left main and three-vessel coronary arteriosclerosis. Ectasia of the ascending thoracic aorta to 3.6 cm. Electronically Signed   By: DAshley RoyaltyM.D.   On: 03/04/2018 19:39   Nm Gi Blood Loss  Result  Date: 03/03/2018 CLINICAL DATA:  Gastrointestinal bleeding, bloody diarrhea for 1 week, remote  appendectomy EXAM: NUCLEAR MEDICINE GASTROINTESTINAL BLEEDING SCAN TECHNIQUE: Sequential abdominal images were obtained following intravenous administration of Tc-58mlabeled red blood cells. RADIOPHARMACEUTICALS:  22.68 mCi Tc-93mertechnetate in-vitro labeled red cells. COMPARISON:  None FINDINGS: Normal blood pool distribution of labeled red cells. Small amount of de labeled tracer within the urinary bladder. No abnormal gastrointestinal localization of tracer identified to suggest active GI bleeding. IMPRESSION: Negative GI bleeding study. Electronically Signed   By: MaLavonia Dana.D.   On: 03/03/2018 12:26   Ct Abdomen Pelvis W Contrast  Result Date: 03/04/2018 CLINICAL DATA:  [age]ear old female with rectal bleeding. Patient had a sigmoidoscopy to confirm a nonobstructing large rectal mass per rectal exam. EXAM: CT CHEST, ABDOMEN, AND PELVIS WITH CONTRAST TECHNIQUE: Multidetector CT imaging of the chest, abdomen and pelvis was performed following the standard protocol during bolus administration of intravenous contrast. CONTRAST:  10022mSOVUE-300 IOPAMIDOL (ISOVUE-300) INJECTION 61% COMPARISON:  None. FINDINGS: CT CHEST FINDINGS Cardiovascular: Patent great vessels. Mild to moderate aortic atherosclerosis with ectasia of the ascending aorta to 3.6 cm. No large central pulmonary embolus. Heart is normal in size without pericardial effusion or thickening. Left main and three-vessel coronary arteriosclerosis is identified. Mediastinum/Nodes: No enlarged mediastinal, hilar, or axillary lymph nodes. Thyroid gland, trachea, and esophagus demonstrate no significant findings. Lungs/Pleura: No pleural effusion, consolidation, pneumothorax or dominant mass. Musculoskeletal: Thoracic spondylosis. No aggressive osseous lesions. No acute fracture. CT ABDOMEN PELVIS FINDINGS Hepatobiliary: A 5 mm rounded hypodensity adjacent to the gallbladder fossa is identified, too small to further characterize, series 2, image 49.  No additional lesions are noted. No enhancing abnormalities are seen. Punctate calcification along the dependent aspect of the gallbladder. No secondary signs of acute cholecystitis. Pancreas: Atrophic pancreas with calcifications compatible with stigmata of chronic pancreatitis. No pancreatic inflammation. Spleen: Normal Adrenals/Urinary Tract: Normal bilateral adrenal glands. Bilateral renal hypodensities statistically consistent with cysts, the largest on the right is in the lower pole anteriorly measuring 11 mm and on the left measuring 16 mm in the upper pole. No nephrolithiasis nor hydroureteronephrosis. The urinary bladder is decompressed. Stomach/Bowel: Eccentric mural thickening along the posterior wall of the rectum. No localized adenopathy. Scattered colonic diverticulosis is identified more prominently affecting the sigmoid colon with circular muscle hypertrophy. No bowel obstruction is seen. The stomach and small intestine are unremarkable. Vascular/Lymphatic: Moderate aortoiliac atherosclerosis. No retroperitoneal, mesenteric or pelvic sidewall adenopathy. No inguinal adenopathy. Reproductive: Uterus and bilateral adnexa are unremarkable. Other: No free air nor free fluid. Small fat containing umbilical hernia. Musculoskeletal: No aggressive osteolytic or blastic disease. Lumbar spondylosis with multilevel degenerative disc and facet arthropathy. IMPRESSION: 1. Eccentric mural thickening along the posterior wall of the rectum may correspond with the reported rectal carcinoma. No local or definite metastatic disease. There is a 5 mm hypodensity adjacent to the gallbladder fossa too small to further characterize, felt to represent a small cyst but can be further correlated with dedicated liver protocol CT or MRI. 2. Bilateral renal cysts some which are too small to further characterize as well. The largest on the right is approximately 11 mm and on the left 16 mm. 3. Left main and three-vessel coronary  arteriosclerosis. Ectasia of the ascending thoracic aorta to 3.6 cm. Electronically Signed   By: DavAshley RoyaltyD.   On: 03/04/2018 19:39    Assessment:  The patient is a [age]o. wman with rectal mass presenting with several weeks of hematochezia.  Flexible sigmoidoscopy on 03/04/2018 revealed an ulcerated non-obstructing large mass in the rectum.  The mass was partially circumferential involving 1/2 of the lumen circumference.  Mass was 5 cm in length.  Biopsies are pending.  Chest, abdomen, and pelvic CT on 03/04/2018 revealed eccentric mural thickening along the posterior wall of the rectum.  There was no local or definite metastatic disease. There was a 5 mm hypodensity adjacent to the gallbladder fossa too small to further characterize, felt to represent a small cyst.  There were bilateral renal cysts   She has a history of CVA in 07/2017.  Symptomatically, she feels fine.  Exam is unremarkable.  Performance status is 1.  Plan:   1.  Oncology:  Discuss diagnosis, staging, and management of rectal cancer in detail with patient and her daughter.  Discuss plan for colonoscopy to ensure no other lesions.  Await pathology.  Discuss plan for either pelvic MRI or transrectal ultrasound for accurate staging (depth of lesion, lymph node status).  CT scans reveal no evidence of metastatic disease.  Discuss surgery and likely chemotherapy and radiation.  Multiple questions were asked and answered.  CEA ordered.  2.  Hematology:  Hemoglobin 11.0.  Suspect mild iron deficiency secondary to ongoing blood losses.  Ferritin and Iron studies sent.   Thank you for allowing me to participate in Destiny [name] 's care.  I will follow her closely with you while hospitalized and after discharge in the outpatient department.   Lequita Asal, MD  03/05/2018, 10:20 AM

## 2018-03-05 NOTE — Consult Note (Addendum)
SURGICAL CONSULTATION NOTE (initial) - cpt: 99255  HISTORY OF PRESENT ILLNESS (HPI):  84 y.o. female presented to [facility] ED 3 days ago for evaluation of rectal bleeding. Patient reports she first developed constipation a few months ago, with which she intermittently noticed a small amount of blood on toilet paper when wiping. This then progressed to more frequent blood per rectum in the toilet and on her clothing, for which she 1 month ago self-discontinued Plavix she'd been prescribed in association with a prior stroke. Patient reports her last colonoscopy was 25 years ago (and she describes may have been "partial"). She otherwise denies denies abdominal pain, N/V, fever/chills, weight loss, CP, SOB, or dizziness and appears to have overall good functional capability without any CP or SOB with walking 1 - 2 blocks or down stairs, though she specifies that she is careful to walk up stairs slowly as not to fall and tries to avoid walking up stairs for the same reason. Since her flexible sigmoidoscopy yesterday, she denies any further blood per rectum.  Surgery is consulted by medical physician Dr. Estanislado Pandy in this context for evaluation and management of hematochezia with large rectal mass.  PAST MEDICAL HISTORY (PMH):  Past Medical History:  Diagnosis Date  . Allergy   . Diabetes mellitus without complication ([place])   . Hyperlipidemia   . Shingles   . Stroke [facility])      PAST SURGICAL HISTORY ([place]):  Past Surgical History:  Procedure Laterality Date  . APPENDECTOMY    . BACK SURGERY    . FOOT SURGERY Bilateral    H[place]  . JOINT REPLACEMENT    . KNEE SURGERY Bilateral   . TONSILECTOMY, ADENOIDECTOMY, BILATERAL MYRINGOTOMY AND TUBES       MEDICATIONS:  Prior to Admission medications   Medication Sig Start Date End Date Taking? Authorizing Provider  amLODipine (NORVASC) 2.5 MG tablet Take 1 tablet (2.5 mg total) by mouth daily. Take once daily if blood pressure > 160 10/04/16 02/03/25 Yes  Merlyn Lot, MD  aspirin EC 81 MG tablet Take 1 tablet (81 mg total) by mouth daily. 07/30/17 07/30/18 Yes Vaughan Basta, MD  butalbital-acetaminophen-caffeine (FIORICET, ESGIC) [phone] MG tablet Take 1 tablet by mouth every 4 (four) hours as needed for headache.   Yes [provider]  cholecalciferol (VITAMIN D) 1000 units tablet Take 1,000 Units by mouth daily.   Yes [provider]  doxepin (SINEQUAN) 10 MG capsule TAKE ONE CAPSULE BY MOUTH EVERY NIGHT AT BEDTIME 07/30/16  Yes [provider]  fluticasone (FLONASE) 50 MCG/ACT nasal spray Place 2 sprays into both nostrils daily.   Yes [provider]  hydrochlorothiazide (HYDRODIURIL) 12.5 MG tablet Take 1 tablet (12.5 mg total) by mouth daily. 02/03/18  Yes Loney Hering, MD  ibuprofen (ADVIL,MOTRIN) 200 MG tablet Take 400 mg by mouth at bedtime as needed for mild pain.   Yes [provider]  metFORMIN (GLUCOPHAGE) 500 MG tablet TAKE ONE (1) TABLET BY MOUTH EVERY DAY 07/01/16  Yes [provider]  telmisartan (MICARDIS) 80 MG tablet Take 1 tablet by mouth daily. 07/25/17  Yes [provider]  vitamin B-12 (CYANOCOBALAMIN) 1000 MCG tablet Take 1,000 mcg by mouth daily.   Yes [provider]     ALLERGIES:  Allergies  Allergen Reactions  . Penicillins     Has patient had a PCN reaction causing immediate rash, facial/tongue/throat swelling, SOB or lightheadedness with hypotension: Unknown Has patient had a PCN reaction causing severe rash involving  mucus membranes or skin necrosis: Unknown Has patient had a PCN reaction that required hospitalization: Unknown Has patient had a PCN reaction occurring within the last 10 years: No If all of the above answers are "NO", then may proceed with Cephalosporin use.   . Ampicillin Rash  . Hydrocodone-Homatropine     Other reaction(s): Other (See Comments) tachycardia  . Amoxicillin Rash  . Tramadol Itching      SOCIAL HISTORY:  Social History   Socioeconomic History  . Marital status: Widowed    Spouse name: Not on file  . Number of children: Not on file  . Years of education: Not on file  . Highest education level: Not on file  Occupational History  . Occupation: retired  Scientific laboratory technician  . Financial resource strain: Not on file  . Food insecurity:    Worry: Not on file    Inability: Not on file  . Transportation needs:    Medical: Not on file    Non-medical: Not on file  Tobacco Use  . Smoking status: Never Smoker  . Smokeless tobacco: Never Used  Substance and Sexual Activity  . Alcohol use: No  . Drug use: No  . Sexual activity: Never  Lifestyle  . Physical activity:    Days per week: Not on file    Minutes per session: Not on file  . Stress: Not on file  Relationships  . Social connections:    Talks on phone: Not on file    Gets together: Not on file    Attends religious service: Not on file    Active member of club or organization: Not on file    Attends meetings of clubs or organizations: Not on file    Relationship status: Not on file  . Intimate partner violence:    Fear of current or ex partner: Not on file    Emotionally abused: Not on file    Physically abused: Not on file    Forced sexual activity: Not on file  Other Topics Concern  . Not on file  Social History Narrative  . Not on file    The patient currently resides (home / rehab facility / nursing home): Home The patient normally is (ambulatory / bedbound): Ambulatory   FAMILY HISTORY:  Family History  Problem Relation Age of Onset  . Stroke Mother   . Stroke Father      REVIEW OF SYSTEMS:  Constitutional: denies weight loss, fever, chills, or sweats  Eyes: denies any other vision changes, history of eye injury  ENT: denies sore throat, hearing problems  Respiratory: denies shortness of breath, wheezing  Cardiovascular: denies chest pain, palpitations  Gastrointestinal: abdominal pain, N/V,  and bowel function as per HPI Genitourinary: denies burning with urination or urinary frequency Musculoskeletal: denies any other joint pains or cramps  Skin: denies any other rashes or skin discolorations  Neurological: denies any other headache, dizziness, weakness  Psychiatric: denies any other depression, anxiety   All other review of systems were negative   VITAL SIGNS:  Temp:  [97.6 F (36.4 C)-98.6 F (37 C)] 97.6 F (36.4 C) (05/23 0818) Pulse Rate:  [76-77] 76 (05/23 0818) Resp:  [14-16] 14 (05/23 0818) BP: (112-139)/(36-67) 139/67 (05/23 0818) SpO2:  [96 %-98 %] 98 % (05/23 0818)     Height: 5' (152.4 cm) Weight: 127 lb 13.9 oz (58 kg) BMI (Calculated): 24.97   INTAKE/OUTPUT:  This shift: Total I/O In: 420 [P.O.:420] Out: -   Last 2 shifts: @  IOLAST2SHIFTS@   PHYSICAL EXAM:  Constitutional:  -- Normal body habitus  -- Awake, alert, and oriented x3, no apparent distress Eyes:  -- Pupils equally round and reactive to light  -- No scleral icterus, B/L no occular discharge Ear, nose, throat: -- Neck is FROM WNL -- No jugular venous distension  Pulmonary:  -- No wheezes or rhales -- Equal breath sounds bilaterally -- Breathing non-labored at rest Cardiovascular:  -- S1, S2 present  -- No pericardial rubs  Gastrointestinal:  -- Abdomen soft, nontender, non-distended, no guarding or rebound tenderness -- No abdominal masses appreciated, pulsatile or otherwise  Anorectal: -- Anal sphincter tone WNL, minimal or no gross blood appreciated -- Irregular non-tender mass palpable in the anterior rectum Musculoskeletal and Integumentary:  -- Wounds or skin discoloration: None appreciated -- Extremities: B/L UE and LE FROM, hands and feet warm, no edema  Neurologic:  -- Motor function: Intact and symmetric -- Sensation: Intact and symmetric Psychiatric:  -- Mood and affect WNL  Labs:  CBC Latest Ref Rng & Units 03/05/2018 03/04/2018 03/03/2018  WBC 3.6 - 11.0 K/uL 7.6  5.4 6.0  Hemoglobin 12.0 - 16.0 g/dL 11.0(L) 11.6(L) 11.3(L)  Hematocrit 35.0 - 47.0 % 32.1(L) 33.5(L) 33.2(L)  Platelets 150 - 440 K/uL 240 246 234   CMP Latest Ref Rng & Units 03/05/2018 03/03/2018 03/02/2018  Glucose 65 - 99 mg/dL 110(H) 117(H) 114(H)  BUN 6 - 20 mg/dL 14 14 20   Creatinine 0.44 - 1.00 mg/dL 0.82 0.81 1.36(H)  Sodium 135 - 145 mmol/L 137 134(L) 129(L)  Potassium 3.5 - 5.1 mmol/L 3.6 3.6 3.9  Chloride 101 - 111 mmol/L 102 99(L) 92(L)  CO2 22 - 32 mmol/L 29 30 31   Calcium 8.9 - 10.3 mg/dL 8.5(L) 8.5(L) 9.2  Total Protein 6.5 - 8.1 g/dL - - 6.4(L)  Total Bilirubin 0.3 - 1.2 mg/dL - - 0.5  Alkaline Phos 38 - 126 U/L - - 57  AST 15 - 41 U/L - - 22  ALT 14 - 54 U/L - - 10(L)   Imaging studies:  Flexible Sigmoidoscopy (03/04/2018) - Hemorrhoids found on perianal exam. - Rectal mass. Source of patient's hematochezia. - Rule out malignancy, tumor in the rectum. Biopsied. - Internal hemorrhoids.  CT Abdomen, Pelvis, and Chest with Contrast (03/04/2018) - personally reviewed and discussed with patient 1. Eccentric mural thickening along the posterior wall of the rectum may correspond with the reported rectal carcinoma. No local or definite metastatic disease. There is a 5 mm hypodensity adjacent to the gallbladder fossa too small to further characterize, felt to represent a small cyst but can be further correlated with dedicated liver protocol CT or MRI. 2. Bilateral renal cysts some which are too small to further characterize as well. The largest on the right is approximately 11 mm and on the left 16 mm. 3. Left main and three-vessel coronary arteriosclerosis. Ectasia of the ascending thoracic aorta to 3.6 cm.  Assessment/Plan: (ICD-10's: C20) 84 y.o. female with large, ulcerated, not completely obstructing rectal mass concerning for adenocarcinoma, complicated by hematochezia with acute blood loss anemia and by pertinent comorbidities including DM, HLD, and chronic  (recently discontinued x 1 month) Plavix antiplatelet therapy for history of stroke.   - follow up pending pathology  - monitor Hb and transfuse as needed  - clear liquids diet + PO nutritional supplements  - hydration with bowel prep for colonoscopy tomorrow  - medical management of comorbidities, risk stratification, and optimization as per medical team  -  if a surgical candidate, recommend transfer to [facility] for colorectal surgery evaluation  - further imaging (endoscopic ultrasound, MRI, etc) as per operating surgical team  - DVT prophylaxis and ambulation encouraged  All of the above findings and recommendations were discussed with the patient, and all of patient's questions were answered to her expressed satisfaction. Patient's daughter was attempted to be reached by phone, but was not able to be reached.  Thank you for the opportunity to participate in this patient's care.   -- Marilynne Drivers Rosana Hoes, MD, [place]: [facility] - Partnering for exceptional care. Office: [phone]

## 2018-03-05 NOTE — Progress Notes (Addendum)
Destiny [name] NAME: Destiny [name]    MR#:  [id number]  DATE OF BIRTH:  14-Apr-1924  SUBJECTIVE:  Patient seen and evaluated today Had an episode of small blood rectally this am No chest pain No shortness of breath No abdominal pain   REVIEW OF SYSTEMS:    ROS  CONSTITUTIONAL: No documented fever. No fatigue, weakness. No weight gain, no weight loss.  EYES: No blurry or double vision.  ENT: No tinnitus. No postnasal drip. No redness of the oropharynx.  RESPIRATORY: No cough, no wheeze, no hemoptysis. No dyspnea.  CARDIOVASCULAR: No chest pain. No orthopnea. No palpitations. No syncope.  GASTROINTESTINAL: No nausea, no vomiting or diarrhea. No abdominal pain. No melena or hematochezia.  GENITOURINARY: No dysuria or hematuria.  ENDOCRINE: No polyuria or nocturia. No heat or cold intolerance.  HEMATOLOGY: No anemia. No bruising. No bleeding.  INTEGUMENTARY: No rashes. No lesions.  MUSCULOSKELETAL: No arthritis. No swelling. No gout.  NEUROLOGIC: No numbness, tingling, or ataxia. No seizure-type activity.  PSYCHIATRIC: No anxiety. No insomnia. No ADD.   DRUG ALLERGIES:   Allergies  Allergen Reactions  . Penicillins     Has patient had a PCN reaction causing immediate rash, facial/tongue/throat swelling, SOB or lightheadedness with hypotension: Unknown Has patient had a PCN reaction causing severe rash involving mucus membranes or skin necrosis: Unknown Has patient had a PCN reaction that required hospitalization: Unknown Has patient had a PCN reaction occurring within the last 10 years: No If all of the above answers are "NO", then may proceed with Cephalosporin use.   . Ampicillin Rash  . Hydrocodone-Homatropine     Other reaction(s): Other (See Comments) tachycardia  . Amoxicillin Rash  . Tramadol Itching    VITALS:  Blood pressure 139/67, pulse 76, temperature 97.6 F (36.4 C), temperature source Oral, resp. rate 14,  height 5' (1.524 m), weight 58 kg (127 lb 13.9 oz), SpO2 98 %.  PHYSICAL EXAMINATION:   Physical Exam  GENERAL:  84 y.o.-year-old patient lying in the bed with no acute distress.  EYES: Pupils equal, round, reactive to light and accommodation. No scleral icterus. Extraocular muscles intact.  HEENT: Head atraumatic, normocephalic. Oropharynx and nasopharynx clear.  NECK:  Supple, no jugular venous distention. No thyroid enlargement, no tenderness.  LUNGS: Normal breath sounds bilaterally, no wheezing, rales, rhonchi. No use of accessory muscles of respiration.  CARDIOVASCULAR: S1, S2 normal. No murmurs, rubs, or gallops.  ABDOMEN: Soft, nontender, nondistended. Bowel sounds present. No organomegaly or mass.  Per rectally mass noted EXTREMITIES: No cyanosis, clubbing or edema b/l.    NEUROLOGIC: Cranial nerves II through XII are intact. No focal Motor or sensory deficits b/l.   PSYCHIATRIC: The patient is alert and oriented x 3.  SKIN: Small skin tear over the left lower extremity  LABORATORY PANEL:   CBC Recent Labs  Lab 03/05/18 0326  WBC 7.6  HGB 11.0*  HCT 32.1*  PLT 240   ------------------------------------------------------------------------------------------------------------------ Chemistries  Recent Labs  Lab 03/02/18 1621  03/05/18 0326  NA 129*   < > 137  K 3.9   < > 3.6  CL 92*   < > 102  CO2 31   < > 29  GLUCOSE 114*   < > 110*  BUN 20   < > 14  CREATININE 1.36*   < > 0.82  CALCIUM 9.2   < > 8.5*  AST 22  --   --   ALT 10*  --   --  ALKPHOS 57  --   --   BILITOT 0.5  --   --    < > = values in this interval not displayed.   ------------------------------------------------------------------------------------------------------------------  Cardiac Enzymes No results for input(s): TROPONINI in the last 168 hours. ------------------------------------------------------------------------------------------------------------------  RADIOLOGY:  Ct Chest W  Contrast  Result Date: 03/04/2018 CLINICAL DATA:  84 year old female with rectal bleeding. Patient had a sigmoidoscopy to confirm a nonobstructing large rectal mass per rectal exam. EXAM: CT CHEST, ABDOMEN, AND PELVIS WITH CONTRAST TECHNIQUE: Multidetector CT imaging of the chest, abdomen and pelvis was performed following the standard protocol during bolus administration of intravenous contrast. CONTRAST:  178mL ISOVUE-300 IOPAMIDOL (ISOVUE-300) INJECTION 61% COMPARISON:  None. FINDINGS: CT CHEST FINDINGS Cardiovascular: Patent great vessels. Mild to moderate aortic atherosclerosis with ectasia of the ascending aorta to 3.6 cm. No large central pulmonary embolus. Heart is normal in size without pericardial effusion or thickening. Left main and three-vessel coronary arteriosclerosis is identified. Mediastinum/Nodes: No enlarged mediastinal, hilar, or axillary lymph nodes. Thyroid gland, trachea, and esophagus demonstrate no significant findings. Lungs/Pleura: No pleural effusion, consolidation, pneumothorax or dominant mass. Musculoskeletal: Thoracic spondylosis. No aggressive osseous lesions. No acute fracture. CT ABDOMEN PELVIS FINDINGS Hepatobiliary: A 5 mm rounded hypodensity adjacent to the gallbladder fossa is identified, too small to further characterize, series 2, image 49. No additional lesions are noted. No enhancing abnormalities are seen. Punctate calcification along the dependent aspect of the gallbladder. No secondary signs of acute cholecystitis. Pancreas: Atrophic pancreas with calcifications compatible with stigmata of chronic pancreatitis. No pancreatic inflammation. Spleen: Normal Adrenals/Urinary Tract: Normal bilateral adrenal glands. Bilateral renal hypodensities statistically consistent with cysts, the largest on the right is in the lower pole anteriorly measuring 11 mm and on the left measuring 16 mm in the upper pole. No nephrolithiasis nor hydroureteronephrosis. The urinary bladder is  decompressed. Stomach/Bowel: Eccentric mural thickening along the posterior wall of the rectum. No localized adenopathy. Scattered colonic diverticulosis is identified more prominently affecting the sigmoid colon with circular muscle hypertrophy. No bowel obstruction is seen. The stomach and small intestine are unremarkable. Vascular/Lymphatic: Moderate aortoiliac atherosclerosis. No retroperitoneal, mesenteric or pelvic sidewall adenopathy. No inguinal adenopathy. Reproductive: Uterus and bilateral adnexa are unremarkable. Other: No free air nor free fluid. Small fat containing umbilical hernia. Musculoskeletal: No aggressive osteolytic or blastic disease. Lumbar spondylosis with multilevel degenerative disc and facet arthropathy. IMPRESSION: 1. Eccentric mural thickening along the posterior wall of the rectum may correspond with the reported rectal carcinoma. No local or definite metastatic disease. There is a 5 mm hypodensity adjacent to the gallbladder fossa too small to further characterize, felt to represent a small cyst but can be further correlated with dedicated liver protocol CT or MRI. 2. Bilateral renal cysts some which are too small to further characterize as well. The largest on the right is approximately 11 mm and on the left 16 mm. 3. Left main and three-vessel coronary arteriosclerosis. Ectasia of the ascending thoracic aorta to 3.6 cm. Electronically Signed   By: Ashley Royalty M.D.   On: 03/04/2018 19:39   Ct Abdomen Pelvis W Contrast  Result Date: 03/04/2018 CLINICAL DATA:  84 year old female with rectal bleeding. Patient had a sigmoidoscopy to confirm a nonobstructing large rectal mass per rectal exam. EXAM: CT CHEST, ABDOMEN, AND PELVIS WITH CONTRAST TECHNIQUE: Multidetector CT imaging of the chest, abdomen and pelvis was performed following the standard protocol during bolus administration of intravenous contrast. CONTRAST:  142mL ISOVUE-300 IOPAMIDOL (ISOVUE-300) INJECTION 61% COMPARISON:   None.  FINDINGS: CT CHEST FINDINGS Cardiovascular: Patent great vessels. Mild to moderate aortic atherosclerosis with ectasia of the ascending aorta to 3.6 cm. No large central pulmonary embolus. Heart is normal in size without pericardial effusion or thickening. Left main and three-vessel coronary arteriosclerosis is identified. Mediastinum/Nodes: No enlarged mediastinal, hilar, or axillary lymph nodes. Thyroid gland, trachea, and esophagus demonstrate no significant findings. Lungs/Pleura: No pleural effusion, consolidation, pneumothorax or dominant mass. Musculoskeletal: Thoracic spondylosis. No aggressive osseous lesions. No acute fracture. CT ABDOMEN PELVIS FINDINGS Hepatobiliary: A 5 mm rounded hypodensity adjacent to the gallbladder fossa is identified, too small to further characterize, series 2, image 49. No additional lesions are noted. No enhancing abnormalities are seen. Punctate calcification along the dependent aspect of the gallbladder. No secondary signs of acute cholecystitis. Pancreas: Atrophic pancreas with calcifications compatible with stigmata of chronic pancreatitis. No pancreatic inflammation. Spleen: Normal Adrenals/Urinary Tract: Normal bilateral adrenal glands. Bilateral renal hypodensities statistically consistent with cysts, the largest on the right is in the lower pole anteriorly measuring 11 mm and on the left measuring 16 mm in the upper pole. No nephrolithiasis nor hydroureteronephrosis. The urinary bladder is decompressed. Stomach/Bowel: Eccentric mural thickening along the posterior wall of the rectum. No localized adenopathy. Scattered colonic diverticulosis is identified more prominently affecting the sigmoid colon with circular muscle hypertrophy. No bowel obstruction is seen. The stomach and small intestine are unremarkable. Vascular/Lymphatic: Moderate aortoiliac atherosclerosis. No retroperitoneal, mesenteric or pelvic sidewall adenopathy. No inguinal adenopathy. Reproductive:  Uterus and bilateral adnexa are unremarkable. Other: No free air nor free fluid. Small fat containing umbilical hernia. Musculoskeletal: No aggressive osteolytic or blastic disease. Lumbar spondylosis with multilevel degenerative disc and facet arthropathy. IMPRESSION: 1. Eccentric mural thickening along the posterior wall of the rectum may correspond with the reported rectal carcinoma. No local or definite metastatic disease. There is a 5 mm hypodensity adjacent to the gallbladder fossa too small to further characterize, felt to represent a small cyst but can be further correlated with dedicated liver protocol CT or MRI. 2. Bilateral renal cysts some which are too small to further characterize as well. The largest on the right is approximately 11 mm and on the left 16 mm. 3. Left main and three-vessel coronary arteriosclerosis. Ectasia of the ascending thoracic aorta to 3.6 cm. Electronically Signed   By: Ashley Royalty M.D.   On: 03/04/2018 19:39     ASSESSMENT AND PLAN:  84 year old elderly female patient with history of diabetes mellitus type 2, hyperlipidemia, CVA, shingles presented to the emergency room with rectal bleed  -Gastrointestinal bleeding secondary to rectal mass Status post gastroenterology evaluation Rectal exam revealed mass Sigmoidoscopy and biopsy done by gastroenterology Hemoglobin stable so far F/u path report  F/u for colonoscopy  -Rectal mass CT abdomen and chest reviewed Post wall thickening of rectal wall  noted with no other mets Oncology evaluation f/u Surgery consult  -Diabetes mellitus type 2 Currently on clear liquid diet Sliding scale coverage with insulin as needed  -Aspirin on hold  -DVT prophylaxis sequential compression device to lower extremities  -Hypertension continue Norvasc  All the records are reviewed and case discussed with Care Management/Social Worker. Management plans discussed with the patient, family and they are in agreement.  CODE  STATUS: Full code  DVT Prophylaxis: SCDs  TOTAL TIME TAKING CARE OF THIS PATIENT: 35 minutes.   POSSIBLE D/C IN 2 to 3 DAYS, DEPENDING ON CLINICAL CONDITION.  Saundra Shelling M.D on 03/05/2018 at 3:14 PM  Between 7am to 6pm - Pager -  [phone]  After 6pm go to [email or web address] - password [email or web address] [facility]  Office  [phone]  CC: Primary care physician; Derinda Late, MD  Note: This dictation was prepared with Dragon dictation along with smaller phrase technology. Any transcriptional errors that result from this process are unintentional.

## 2018-03-05 NOTE — Progress Notes (Signed)
Chaplain was rounding an stopped by to visit Pt. Chaplain practiced active listen as daughter talked about their family as it relates to music. 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 As a visitor came in the chaplain prayed and departed . The daughter engaged chaplain about her struggle with a decision related to timing of surgery and with a once in a life travel opportunity. She is hoping the timing can be adjusted. They are a very close family and she want to have the best opportunity to do both. Chaplain encouraged her to live in the present and not to worry about the unknown. It may not have to be a decision made because she doesn't have all the information yet. That will come in a day or so before a deadline. Daughter asked if chaplain will stop   03/05/18 1500  Clinical Encounter Type  Visited With Patient and family together  Referral From Family  Consult/Referral To Nurse  Spiritual Encounters  Spiritual Needs Prayer;Emotional   by tomorrow.

## 2018-03-06 ENCOUNTER — Encounter
Admission: EM | Disposition: A | Discharge status: Home / Self Care | Payer: Self-pay | Source: Home / Self Care | Attending: Internal Medicine

## 2018-03-06 ENCOUNTER — Other Ambulatory Visit: Payer: Self-pay | Admitting: Pathology

## 2018-03-06 ENCOUNTER — Inpatient Hospital Stay: Payer: Medicare Other | Admitting: Anesthesiology

## 2018-03-06 ENCOUNTER — Encounter: Payer: Self-pay | Admitting: Anesthesiology

## 2018-03-06 ENCOUNTER — Telehealth: Payer: Self-pay

## 2018-03-06 ENCOUNTER — Encounter: Payer: Self-pay | Admitting: Gastroenterology

## 2018-03-06 DIAGNOSIS — D125 Benign neoplasm of sigmoid colon: Secondary | ICD-10-CM

## 2018-03-06 DIAGNOSIS — D123 Benign neoplasm of transverse colon: Secondary | ICD-10-CM

## 2018-03-06 DIAGNOSIS — K5730 Diverticulosis of large intestine without perforation or abscess without bleeding: Secondary | ICD-10-CM

## 2018-03-06 DIAGNOSIS — C20 Malignant neoplasm of rectum: Principal | ICD-10-CM

## 2018-03-06 DIAGNOSIS — K635 Polyp of colon: Secondary | ICD-10-CM

## 2018-03-06 DIAGNOSIS — K6389 Other specified diseases of intestine: Secondary | ICD-10-CM

## 2018-03-06 HISTORY — PX: COLONOSCOPY WITH PROPOFOL: SHX5780

## 2018-03-06 LAB — CEA: CEA: 30.3 ng/mL — ABNORMAL HIGH (ref 0.0–4.7)

## 2018-03-06 LAB — HEMOGLOBIN AND HEMATOCRIT, BLOOD
HCT: 31.1 % — ABNORMAL LOW (ref 35.0–47.0)
Hemoglobin: 10.6 g/dL — ABNORMAL LOW (ref 12.0–16.0)

## 2018-03-06 LAB — SURGICAL PATHOLOGY

## 2018-03-06 SURGERY — COLONOSCOPY
Anesthesia: Monitor Anesthesia Care | Laterality: Left

## 2018-03-06 SURGERY — COLONOSCOPY WITH PROPOFOL
Anesthesia: General

## 2018-03-06 MED ORDER — PROPOFOL 500 MG/50ML IV EMUL
INTRAVENOUS | Status: DC | PRN
  Administered 2018-03-06: 50 ug/kg/min via INTRAVENOUS

## 2018-03-06 MED ORDER — PROPOFOL 10 MG/ML IV BOLUS
INTRAVENOUS | Status: DC | PRN
  Administered 2018-03-06: 60 mg via INTRAVENOUS

## 2018-03-06 MED ORDER — LIDOCAINE HCL (PF) 1 % IJ SOLN
INTRAMUSCULAR | Status: AC
  Filled 2018-03-06: qty 2

## 2018-03-06 NOTE — Anesthesia Postprocedure Evaluation (Signed)
Anesthesia Post Note  Patient: Destiny [name]  Procedure(s) Performed: COLONOSCOPY WITH PROPOFOL (N/A )  Patient location during evaluation: Endoscopy Anesthesia Type: General Level of consciousness: awake and alert Pain management: pain level controlled Vital Signs Assessment: post-procedure vital signs reviewed and stable Respiratory status: spontaneous breathing, nonlabored ventilation, respiratory function stable and patient connected to nasal cannula oxygen Cardiovascular status: blood pressure returned to baseline and stable Postop Assessment: no apparent nausea or vomiting Anesthetic complications: no     Last Vitals:  Vitals:   03/06/18 1635 03/06/18 1645  BP: (!) 133/56 (!) 131/51  Pulse: 68 67  Resp: 12 10  Temp:    SpO2: 97% 97%    Last Pain:  Vitals:   03/06/18 1645  TempSrc:   PainSc: 0-No pain                 Martha Clan

## 2018-03-06 NOTE — Anesthesia Post-op Follow-up Note (Signed)
Anesthesia QCDR form completed.        

## 2018-03-06 NOTE — Telephone Encounter (Signed)
Received phone call from Dr. Luana Shu at [facility] with Stat results: invasive adenocarcinoma.  Advised Dr. Bonna Gains via phone.

## 2018-03-06 NOTE — Care Management Important Message (Signed)
Important Message  Patient Details  Name: KATLYNN NASER MRN: [id number] Date of Birth: Jun 17, 1924   Medicare Important Message Given:  Yes    Juliann Pulse A Gawain Crombie 03/06/2018, 11:06 AM

## 2018-03-06 NOTE — Anesthesia Procedure Notes (Signed)
Date/Time: 03/06/2018 3:10 PM Performed by: Jonna Clark, CRNA Pre-anesthesia Checklist: Patient identified, Emergency Drugs available, Suction available, Patient being monitored and Timeout performed Patient Re-evaluated:Patient Re-evaluated prior to induction Oxygen Delivery Method: Nasal cannula Induction Type: IV induction Placement Confirmation: positive ETCO2

## 2018-03-06 NOTE — Progress Notes (Signed)
Vonda Antigua, MD [address], [place], [place], [place], [address] [address], [place], [place], [place], [address] Phone: [phone]  Fax: [phone]   Subjective: Pt. Denies any BRBPR today. Tolerated prep well yesterday. No abdominal pain.    Objective: Exam: Vital signs in last 24 hours: Vitals:   03/05/18 0818 03/05/18 1714 03/06/18 0008 03/06/18 0821  BP: 139/67 (!) 146/78 (!) 118/57 130/70  Pulse: 76 75 78 86  Resp: 14  16   Temp: 97.6 F (36.4 C) 98 F (36.7 C) 97.8 F (36.6 C) 98.6 F (37 C)  TempSrc: Oral Oral Oral Oral  SpO2: 98% 100% 94% 98%  Weight:      Height:       Weight change:   Intake/Output Summary (Last 24 hours) at 03/06/2018 1342 Last data filed at 03/06/2018 1638 Gross per 24 hour  Intake 2067.67 ml  Output -  Net 2067.67 ml    General: No acute distress, AAO x3 Abd: Soft, NT/ND, No HSM Skin: Warm, no rashes Neck: Supple, Trachea midline   Lab Results: Lab Results  Component Value Date   WBC 7.6 03/05/2018   HGB 10.6 (L) 03/06/2018   HCT 31.1 (L) 03/06/2018   MCV 92.3 03/05/2018   PLT 240 03/05/2018   Micro Results: No results found for this or any previous visit (from the past 240 hour(s)). Studies/Results: Ct Chest W Contrast  Result Date: 03/04/2018 CLINICAL DATA:  84 year old female with rectal bleeding. Patient had a sigmoidoscopy to confirm a nonobstructing large rectal mass per rectal exam. EXAM: CT CHEST, ABDOMEN, AND PELVIS WITH CONTRAST TECHNIQUE: Multidetector CT imaging of the chest, abdomen and pelvis was performed following the standard protocol during bolus administration of intravenous contrast. CONTRAST:  156mL ISOVUE-300 IOPAMIDOL (ISOVUE-300) INJECTION 61% COMPARISON:  None. FINDINGS: CT CHEST FINDINGS Cardiovascular: Patent great vessels. Mild to moderate aortic atherosclerosis with ectasia of the ascending aorta to 3.6 cm. No large central pulmonary embolus. Heart is normal in size without  pericardial effusion or thickening. Left main and three-vessel coronary arteriosclerosis is identified. Mediastinum/Nodes: No enlarged mediastinal, hilar, or axillary lymph nodes. Thyroid gland, trachea, and esophagus demonstrate no significant findings. Lungs/Pleura: No pleural effusion, consolidation, pneumothorax or dominant mass. Musculoskeletal: Thoracic spondylosis. No aggressive osseous lesions. No acute fracture. CT ABDOMEN PELVIS FINDINGS Hepatobiliary: A 5 mm rounded hypodensity adjacent to the gallbladder fossa is identified, too small to further characterize, series 2, image 49. No additional lesions are noted. No enhancing abnormalities are seen. Punctate calcification along the dependent aspect of the gallbladder. No secondary signs of acute cholecystitis. Pancreas: Atrophic pancreas with calcifications compatible with stigmata of chronic pancreatitis. No pancreatic inflammation. Spleen: Normal Adrenals/Urinary Tract: Normal bilateral adrenal glands. Bilateral renal hypodensities statistically consistent with cysts, the largest on the right is in the lower pole anteriorly measuring 11 mm and on the left measuring 16 mm in the upper pole. No nephrolithiasis nor hydroureteronephrosis. The urinary bladder is decompressed. Stomach/Bowel: Eccentric mural thickening along the posterior wall of the rectum. No localized adenopathy. Scattered colonic diverticulosis is identified more prominently affecting the sigmoid colon with circular muscle hypertrophy. No bowel obstruction is seen. The stomach and small intestine are unremarkable. Vascular/Lymphatic: Moderate aortoiliac atherosclerosis. No retroperitoneal, mesenteric or pelvic sidewall adenopathy. No inguinal adenopathy. Reproductive: Uterus and bilateral adnexa are unremarkable. Other: No free air nor free fluid. Small fat containing umbilical hernia. Musculoskeletal: No aggressive osteolytic or blastic disease. Lumbar spondylosis with multilevel  degenerative disc and facet arthropathy. IMPRESSION: 1. Eccentric  mural thickening along the posterior wall of the rectum may correspond with the reported rectal carcinoma. No local or definite metastatic disease. There is a 5 mm hypodensity adjacent to the gallbladder fossa too small to further characterize, felt to represent a small cyst but can be further correlated with dedicated liver protocol CT or MRI. 2. Bilateral renal cysts some which are too small to further characterize as well. The largest on the right is approximately 11 mm and on the left 16 mm. 3. Left main and three-vessel coronary arteriosclerosis. Ectasia of the ascending thoracic aorta to 3.6 cm. Electronically Signed   By: Ashley Royalty M.D.   On: 03/04/2018 19:39   Ct Abdomen Pelvis W Contrast  Result Date: 03/04/2018 CLINICAL DATA:  84 year old female with rectal bleeding. Patient had a sigmoidoscopy to confirm a nonobstructing large rectal mass per rectal exam. EXAM: CT CHEST, ABDOMEN, AND PELVIS WITH CONTRAST TECHNIQUE: Multidetector CT imaging of the chest, abdomen and pelvis was performed following the standard protocol during bolus administration of intravenous contrast. CONTRAST:  116mL ISOVUE-300 IOPAMIDOL (ISOVUE-300) INJECTION 61% COMPARISON:  None. FINDINGS: CT CHEST FINDINGS Cardiovascular: Patent great vessels. Mild to moderate aortic atherosclerosis with ectasia of the ascending aorta to 3.6 cm. No large central pulmonary embolus. Heart is normal in size without pericardial effusion or thickening. Left main and three-vessel coronary arteriosclerosis is identified. Mediastinum/Nodes: No enlarged mediastinal, hilar, or axillary lymph nodes. Thyroid gland, trachea, and esophagus demonstrate no significant findings. Lungs/Pleura: No pleural effusion, consolidation, pneumothorax or dominant mass. Musculoskeletal: Thoracic spondylosis. No aggressive osseous lesions. No acute fracture. CT ABDOMEN PELVIS FINDINGS Hepatobiliary: A 5 mm  rounded hypodensity adjacent to the gallbladder fossa is identified, too small to further characterize, series 2, image 49. No additional lesions are noted. No enhancing abnormalities are seen. Punctate calcification along the dependent aspect of the gallbladder. No secondary signs of acute cholecystitis. Pancreas: Atrophic pancreas with calcifications compatible with stigmata of chronic pancreatitis. No pancreatic inflammation. Spleen: Normal Adrenals/Urinary Tract: Normal bilateral adrenal glands. Bilateral renal hypodensities statistically consistent with cysts, the largest on the right is in the lower pole anteriorly measuring 11 mm and on the left measuring 16 mm in the upper pole. No nephrolithiasis nor hydroureteronephrosis. The urinary bladder is decompressed. Stomach/Bowel: Eccentric mural thickening along the posterior wall of the rectum. No localized adenopathy. Scattered colonic diverticulosis is identified more prominently affecting the sigmoid colon with circular muscle hypertrophy. No bowel obstruction is seen. The stomach and small intestine are unremarkable. Vascular/Lymphatic: Moderate aortoiliac atherosclerosis. No retroperitoneal, mesenteric or pelvic sidewall adenopathy. No inguinal adenopathy. Reproductive: Uterus and bilateral adnexa are unremarkable. Other: No free air nor free fluid. Small fat containing umbilical hernia. Musculoskeletal: No aggressive osteolytic or blastic disease. Lumbar spondylosis with multilevel degenerative disc and facet arthropathy. IMPRESSION: 1. Eccentric mural thickening along the posterior wall of the rectum may correspond with the reported rectal carcinoma. No local or definite metastatic disease. There is a 5 mm hypodensity adjacent to the gallbladder fossa too small to further characterize, felt to represent a small cyst but can be further correlated with dedicated liver protocol CT or MRI. 2. Bilateral renal cysts some which are too small to further  characterize as well. The largest on the right is approximately 11 mm and on the left 16 mm. 3. Left main and three-vessel coronary arteriosclerosis. Ectasia of the ascending thoracic aorta to 3.6 cm. Electronically Signed   By: Ashley Royalty M.D.   On: 03/04/2018 19:39   Medications:  Scheduled  Meds: . amLODipine  2.5 mg Oral Daily  . cholecalciferol  1,000 Units Oral Daily  . doxepin  10 mg Oral QHS  . ferrous sulfate  325 mg Oral BID WC  . fluticasone  2 spray Each Nare Daily  . irbesartan  75 mg Oral Daily  . protein supplement shake  11 oz Oral BID BM   Continuous Infusions: . sodium chloride 20 mL/hr (03/06/18 0525)   PRN Meds:.acetaminophen **OR** acetaminophen, ondansetron **OR** ondansetron (ZOFRAN) IV, zolpidem   Assessment: Active Problems:   Lower GI bleed   GI bleed   Internal hemorrhoids   Nodule of rectum   Intestinal neoplasm   Hematochezia    Plan: We will plan for colonoscopy today, to evaluate for any other lesions in the colon  Biopsy results from flexible sigmoidoscopy obtained, and showed invasive adenocarcinoma.  This was discussed with patient and family in detail and they verbalized understanding. Surgery has seen the patient as well.  See their notes  I have discussed alternative options, risks & benefits,  which include, but are not limited to, bleeding, infection, perforation,respiratory complication & drug reaction.  The patient agrees with this plan & written consent will be obtained.    No hemodynamic changes or acute anemia present at this time Safe to proceed with colonoscopy at this time, and it would help determine surgery plans as well.   LOS: 3 days   Vonda Antigua, MD 03/06/2018, 1:42 PM

## 2018-03-06 NOTE — Op Note (Addendum)
[facility] Patient Name: Destiny [name] Procedure Date: 03/06/2018 3:03 PM MRN: [id number] Account #: [id number] Date of Birth: [birth year] Admit Type: Inpatient Age: 84 Room: [facility] ENDO ROOM 2 Gender: Female Note Status: Finalized Procedure:            Colonoscopy Indications:          Rectal cancer Providers:            Varnita B. Bonna Gains MD, MD Referring MD:         Caprice Renshaw MD (Referring MD) Medicines:            Monitored Anesthesia Care Complications:        No immediate complications. Procedure:            Pre-Anesthesia Assessment:                       - ASA Grade Assessment: III - A patient with severe                        systemic disease.                       - Prior to the procedure, a History and Physical was                        performed, and patient medications, allergies and                        sensitivities were reviewed. The patient's tolerance of                        previous anesthesia was reviewed.                       - The risks and benefits of the procedure and the                        sedation options and risks were discussed with the                        patient. All questions were answered and informed                        consent was obtained.                       - Patient identification and proposed procedure were                        verified prior to the procedure by the physician, the                        nurse, the anesthesiologist, the anesthetist and the                        technician. The procedure was verified in the procedure                        room.                       After obtaining  informed consent, the colonoscope was                        passed under direct vision. Throughout the procedure,                        the patient's blood pressure, pulse, and oxygen                        saturations were monitored continuously. The   Colonoscope was introduced through the anus and                        advanced to the the cecum, identified by appendiceal                        orifice and ileocecal valve. The colonoscopy was                        performed with ease. The patient tolerated the                        procedure well. The quality of the bowel preparation                        was good except the cecum was fair. Findings:      The perianal and digital rectal examinations were normal.      A 7 mm polyp was found in the hepatic flexure. The polyp was flat.       Polypectomy was attempted, initially using a cold snare. Polyp resection       was incomplete with this device. This intervention then required a       different device and polypectomy technique. The polyp was removed with a       jumbo cold forceps. Resection and retrieval were complete. For       hemostasis, one hemostatic clip was successfully placed. There was no       bleeding at the end of the procedure.      A localized area of mildly thickened folds of the mucosa was found in       the cecum. Biopsies were taken with a cold forceps for histology.      A 4 mm polyp was found in the sigmoid colon. The polyp was sessile. The       polyp was removed with a jumbo cold forceps. Resection and retrieval       were complete.      Multiple diverticula were found in the sigmoid colon and ascending colon.      An ulcerated non-obstructing large mass was found in the rectum. The       mass was partially circumferential (involving one-half of the lumen       circumference). No bleeding was present. Biopsies not indicated as       biopsy obtain on Flexible sigmoidoscopy 2 days ago.      The exam was otherwise without abnormality. Impression:           - One 7 mm polyp at the hepatic flexure, removed with a                        jumbo cold forceps. Resected and retrieved. Clip  was                        placed.                       - Thickened folds of  the mucosa in the cecum. Biopsied.                       - One 4 mm polyp in the sigmoid colon, removed with a                        jumbo cold forceps. Resected and retrieved.                       - Diverticulosis in the sigmoid colon and in the                        ascending colon.                       - Malignant tumor in the rectum.                       - The examination was otherwise normal. Recommendation:       - High fiber diet.                       - Advance diet as tolerated.                       - Continue present medications.                       - Await pathology results.                       - The findings and recommendations were discussed with                        the patient.                       - The findings and recommendations were discussed with                        the patient's family.                       - Continue surgical plan and oncology follow up as                        planned Procedure Code(s):    --- Professional ---                       [phone], 59, Colonoscopy, flexible; with control of                        bleeding, any method                       45380, Colonoscopy, flexible; with biopsy, single or  multiple Diagnosis Code(s):    --- Professional ---                       D12.3, Benign neoplasm of transverse colon (hepatic                        flexure or splenic flexure)                       D12.5, Benign neoplasm of sigmoid colon                       K63.89, Other specified diseases of intestine                       C20, Malignant neoplasm of rectum                       K57.30, Diverticulosis of large intestine without                        perforation or abscess without bleeding CPT copyright 2017 [employer]. All rights reserved. The codes documented in this report are preliminary and upon coder review may  be revised to meet current compliance requirements.  Vonda Antigua,  MD Margretta Sidle B. Bonna Gains MD, MD 03/06/2018 4:06:20 PM This report has been signed electronically. Number of Addenda: 0 Note Initiated On: 03/06/2018 3:03 PM Scope Withdrawal Time: 0 hours 28 minutes 16 seconds  Total Procedure Duration: 0 hours 41 minutes 55 seconds  Estimated Blood Loss: Estimated blood loss: none.      [facility]

## 2018-03-06 NOTE — Progress Notes (Signed)
Patient ID: Destiny [name], female   DOB: 17-Nov-1923, 84 y.o.   MRN: [id number]  [facility] PROGRESS NOTE  Destiny [name] HKV:[id number] DOB: September 20, 1924 DOA: 03/02/2018 PCP: Derinda Late, MD  HPI/Subjective: Patient feeling okay.  Offers no complaints.  Objective: Vitals:   03/06/18 0008 03/06/18 0821  BP: (!) 118/57 130/70  Pulse: 78 86  Resp: 16   Temp: 97.8 F (36.6 C) 98.6 F (37 C)  SpO2: 94% 98%    Filed Weights   03/02/18 1619 03/02/18 2000  Weight: 56.7 kg (125 lb) 58 kg (127 lb 13.9 oz)    ROS: Review of Systems  Constitutional: Negative for chills and fever.  Eyes: Negative for blurred vision.  Respiratory: Negative for cough and shortness of breath.   Cardiovascular: Negative for chest pain.  Gastrointestinal: Negative for abdominal pain, constipation, diarrhea, nausea and vomiting.  Genitourinary: Negative for dysuria.  Musculoskeletal: Negative for joint pain.  Neurological: Negative for dizziness and headaches.   Exam: Physical Exam  Constitutional: She is oriented to person, place, and time.  HENT:  Nose: No mucosal edema.  Mouth/Throat: No oropharyngeal exudate or posterior oropharyngeal edema.  Eyes: Pupils are equal, round, and reactive to light. Conjunctivae, EOM and lids are normal.  Neck: No JVD present. Carotid bruit is not present. No edema present. No thyroid mass and no thyromegaly present.  Cardiovascular: S1 normal and S2 normal. Exam reveals no gallop.  No murmur heard. Pulses:      Dorsalis pedis pulses are 2+ on the right side, and 2+ on the left side.  Respiratory: No respiratory distress. She has no wheezes. She has no rhonchi. She has no rales.  GI: Soft. Bowel sounds are normal. There is no tenderness.  Musculoskeletal:       Right ankle: She exhibits no swelling.       Left ankle: She exhibits no swelling.  Lymphadenopathy:    She has no cervical adenopathy.  Neurological: She is alert and oriented to person, place, and  time. No cranial nerve deficit.  Skin: Skin is warm. No rash noted. Nails show no clubbing.  Psychiatric: She has a normal mood and affect.      Data Reviewed: Basic Metabolic Panel: Recent Labs  Lab 03/02/18 1621 03/03/18 0431 03/05/18 0326  NA 129* 134* 137  K 3.9 3.6 3.6  CL 92* 99* 102  CO2 31 30 29   GLUCOSE 114* 117* 110*  BUN 20 14 14   CREATININE 1.36* 0.81 0.82  CALCIUM 9.2 8.5* 8.5*   Liver Function Tests: Recent Labs  Lab 03/02/18 1621  AST 22  ALT 10*  ALKPHOS 57  BILITOT 0.5  PROT 6.4*  ALBUMIN 3.9   Recent Labs  Lab 03/02/18 1621  LIPASE 36   CBC: Recent Labs  Lab 03/02/18 1621 03/03/18 0431 03/04/18 0422 03/05/18 0326 03/06/18 0605  WBC 7.3 6.0 5.4 7.6  --   HGB 12.3 11.3* 11.6* 11.0* 10.6*  HCT 35.9 33.2* 33.5* 32.1* 31.1*  MCV 92.4 91.2 91.9 92.3  --   PLT 248 234 246 240  --     CBG: Recent Labs  Lab 03/03/18 1634 03/03/18 2117 03/04/18 0749 03/04/18 1205 03/04/18 1325  GLUCAP 108* 116* 119* 128* 120*     Studies: Ct Chest W Contrast  Result Date: 03/04/2018 CLINICAL DATA:  84 year old female with rectal bleeding. Patient had a sigmoidoscopy to confirm a nonobstructing large rectal mass per rectal exam. EXAM: CT CHEST, ABDOMEN, AND PELVIS WITH CONTRAST TECHNIQUE: Multidetector  CT imaging of the chest, abdomen and pelvis was performed following the standard protocol during bolus administration of intravenous contrast. CONTRAST:  123mL ISOVUE-300 IOPAMIDOL (ISOVUE-300) INJECTION 61% COMPARISON:  None. FINDINGS: CT CHEST FINDINGS Cardiovascular: Patent great vessels. Mild to moderate aortic atherosclerosis with ectasia of the ascending aorta to 3.6 cm. No large central pulmonary embolus. Heart is normal in size without pericardial effusion or thickening. Left main and three-vessel coronary arteriosclerosis is identified. Mediastinum/Nodes: No enlarged mediastinal, hilar, or axillary lymph nodes. Thyroid gland, trachea, and esophagus  demonstrate no significant findings. Lungs/Pleura: No pleural effusion, consolidation, pneumothorax or dominant mass. Musculoskeletal: Thoracic spondylosis. No aggressive osseous lesions. No acute fracture. CT ABDOMEN PELVIS FINDINGS Hepatobiliary: A 5 mm rounded hypodensity adjacent to the gallbladder fossa is identified, too small to further characterize, series 2, image 49. No additional lesions are noted. No enhancing abnormalities are seen. Punctate calcification along the dependent aspect of the gallbladder. No secondary signs of acute cholecystitis. Pancreas: Atrophic pancreas with calcifications compatible with stigmata of chronic pancreatitis. No pancreatic inflammation. Spleen: Normal Adrenals/Urinary Tract: Normal bilateral adrenal glands. Bilateral renal hypodensities statistically consistent with cysts, the largest on the right is in the lower pole anteriorly measuring 11 mm and on the left measuring 16 mm in the upper pole. No nephrolithiasis nor hydroureteronephrosis. The urinary bladder is decompressed. Stomach/Bowel: Eccentric mural thickening along the posterior wall of the rectum. No localized adenopathy. Scattered colonic diverticulosis is identified more prominently affecting the sigmoid colon with circular muscle hypertrophy. No bowel obstruction is seen. The stomach and small intestine are unremarkable. Vascular/Lymphatic: Moderate aortoiliac atherosclerosis. No retroperitoneal, mesenteric or pelvic sidewall adenopathy. No inguinal adenopathy. Reproductive: Uterus and bilateral adnexa are unremarkable. Other: No free air nor free fluid. Small fat containing umbilical hernia. Musculoskeletal: No aggressive osteolytic or blastic disease. Lumbar spondylosis with multilevel degenerative disc and facet arthropathy. IMPRESSION: 1. Eccentric mural thickening along the posterior wall of the rectum may correspond with the reported rectal carcinoma. No local or definite metastatic disease. There is a 5  mm hypodensity adjacent to the gallbladder fossa too small to further characterize, felt to represent a small cyst but can be further correlated with dedicated liver protocol CT or MRI. 2. Bilateral renal cysts some which are too small to further characterize as well. The largest on the right is approximately 11 mm and on the left 16 mm. 3. Left main and three-vessel coronary arteriosclerosis. Ectasia of the ascending thoracic aorta to 3.6 cm. Electronically Signed   By: Ashley Royalty M.D.   On: 03/04/2018 19:39   Ct Abdomen Pelvis W Contrast  Result Date: 03/04/2018 CLINICAL DATA:  84 year old female with rectal bleeding. Patient had a sigmoidoscopy to confirm a nonobstructing large rectal mass per rectal exam. EXAM: CT CHEST, ABDOMEN, AND PELVIS WITH CONTRAST TECHNIQUE: Multidetector CT imaging of the chest, abdomen and pelvis was performed following the standard protocol during bolus administration of intravenous contrast. CONTRAST:  139mL ISOVUE-300 IOPAMIDOL (ISOVUE-300) INJECTION 61% COMPARISON:  None. FINDINGS: CT CHEST FINDINGS Cardiovascular: Patent great vessels. Mild to moderate aortic atherosclerosis with ectasia of the ascending aorta to 3.6 cm. No large central pulmonary embolus. Heart is normal in size without pericardial effusion or thickening. Left main and three-vessel coronary arteriosclerosis is identified. Mediastinum/Nodes: No enlarged mediastinal, hilar, or axillary lymph nodes. Thyroid gland, trachea, and esophagus demonstrate no significant findings. Lungs/Pleura: No pleural effusion, consolidation, pneumothorax or dominant mass. Musculoskeletal: Thoracic spondylosis. No aggressive osseous lesions. No acute fracture. CT ABDOMEN PELVIS FINDINGS Hepatobiliary: A 5  mm rounded hypodensity adjacent to the gallbladder fossa is identified, too small to further characterize, series 2, image 49. No additional lesions are noted. No enhancing abnormalities are seen. Punctate calcification along the  dependent aspect of the gallbladder. No secondary signs of acute cholecystitis. Pancreas: Atrophic pancreas with calcifications compatible with stigmata of chronic pancreatitis. No pancreatic inflammation. Spleen: Normal Adrenals/Urinary Tract: Normal bilateral adrenal glands. Bilateral renal hypodensities statistically consistent with cysts, the largest on the right is in the lower pole anteriorly measuring 11 mm and on the left measuring 16 mm in the upper pole. No nephrolithiasis nor hydroureteronephrosis. The urinary bladder is decompressed. Stomach/Bowel: Eccentric mural thickening along the posterior wall of the rectum. No localized adenopathy. Scattered colonic diverticulosis is identified more prominently affecting the sigmoid colon with circular muscle hypertrophy. No bowel obstruction is seen. The stomach and small intestine are unremarkable. Vascular/Lymphatic: Moderate aortoiliac atherosclerosis. No retroperitoneal, mesenteric or pelvic sidewall adenopathy. No inguinal adenopathy. Reproductive: Uterus and bilateral adnexa are unremarkable. Other: No free air nor free fluid. Small fat containing umbilical hernia. Musculoskeletal: No aggressive osteolytic or blastic disease. Lumbar spondylosis with multilevel degenerative disc and facet arthropathy. IMPRESSION: 1. Eccentric mural thickening along the posterior wall of the rectum may correspond with the reported rectal carcinoma. No local or definite metastatic disease. There is a 5 mm hypodensity adjacent to the gallbladder fossa too small to further characterize, felt to represent a small cyst but can be further correlated with dedicated liver protocol CT or MRI. 2. Bilateral renal cysts some which are too small to further characterize as well. The largest on the right is approximately 11 mm and on the left 16 mm. 3. Left main and three-vessel coronary arteriosclerosis. Ectasia of the ascending thoracic aorta to 3.6 cm. Electronically Signed   By: Ashley Royalty M.D.   On: 03/04/2018 19:39    Scheduled Meds: . amLODipine  2.5 mg Oral Daily  . cholecalciferol  1,000 Units Oral Daily  . doxepin  10 mg Oral QHS  . ferrous sulfate  325 mg Oral BID WC  . fluticasone  2 spray Each Nare Daily  . irbesartan  75 mg Oral Daily  . protein supplement shake  11 oz Oral BID BM   Continuous Infusions: . sodium chloride 20 mL/hr (03/06/18 0525)    Assessment/Plan:  1. Adenocarcinoma of the rectum.  Patient presented with  gastrointestinal bleed.  Patient to have a colonoscopy today.  Can consider transfer to tertiary care center for colorectal surgeon evaluation.  CT scan of the chest abdomen and pelvis negative. 2. Type 2 diabetes mellitus 3. Essential hypertension on Norvasc 4. History of stroke in the past aspirin on hold with GI bleed and rectal mass   Code Status:     Code Status Orders  (From admission, onward)        Start     Ordered   03/02/18 1948  Limited resuscitation (code)  Continuous    Question Answer Comment  In the event of cardiac or respiratory ARREST: Initiate Code Blue, Call Rapid Response Yes   In the event of cardiac or respiratory ARREST: Perform CPR Yes   In the event of cardiac or respiratory ARREST: Perform Intubation/Mechanical Ventilation No   In the event of cardiac or respiratory ARREST: Use NIPPV/BiPAp only if indicated Yes   In the event of cardiac or respiratory ARREST: Administer ACLS medications if indicated Yes   In the event of cardiac or respiratory ARREST: Perform Defibrillation or Cardioversion if  indicated Yes      03/02/18 1947    Code Status History    Date Active Date Inactive Code Status Order ID Comments User Context   07/30/2017 0233 07/30/2017 2248 Full Code [id number]  Saundra Shelling, MD Inpatient    Advance Directive Documentation     Most Recent Value  Type of Advance Directive  Healthcare Power of Attorney  Pre-existing out of facility DNR order (yellow form or pink MOST form)  -   "MOST" Form in Place?  -     Family Communication: Spoke with daughter at the bedside Disposition Plan: If colonoscopy does not show any other lesions will be a candidate for surgery at a tertiary care center.  Consultants:  General surgery  Gastroenterology  Procedures:  Flexible sigmoidoscopy  Colonoscopy today  Time spent: 35 minutes  [place]

## 2018-03-06 NOTE — Anesthesia Preprocedure Evaluation (Addendum)
Anesthesia Evaluation  Patient identified by MRN, date of birth, ID band Patient awake    Reviewed: Allergy & Precautions, NPO status , Patient's Chart, lab work & pertinent test results, reviewed documented beta blocker date and time   Airway Mallampati: II  TM Distance: >3 FB     Dental  (+) Chipped   Pulmonary           Cardiovascular hypertension,      Neuro/Psych  Headaches,  Neuromuscular disease CVA    GI/Hepatic GERD  ,  Endo/Other  diabetes, Type 2  Renal/GU      Musculoskeletal  (+) Arthritis ,   Abdominal   Peds  Hematology   Anesthesia Other Findings Rectal ca. Echo 60% last year.  Reproductive/Obstetrics                            Anesthesia Physical Anesthesia Plan  ASA: III  Anesthesia Plan: General   Post-op Pain Management:    Induction: Intravenous  PONV Risk Score and Plan:   Airway Management Planned:   Additional Equipment:   Intra-op Plan:   Post-operative Plan:   Informed Consent: I have reviewed the patients History and Physical, chart, labs and discussed the procedure including the risks, benefits and alternatives for the proposed anesthesia with the patient or authorized representative who has indicated his/her understanding and acceptance.     Plan Discussed with: CRNA  Anesthesia Plan Comments:         Anesthesia Quick Evaluation

## 2018-03-06 NOTE — Transfer of Care (Signed)
Immediate Anesthesia Transfer of Care Note  Patient: Destiny [name]  Procedure(s) Performed: COLONOSCOPY WITH PROPOFOL (N/A )  Patient Location: Endoscopy Unit  Anesthesia Type:General  Level of Consciousness: awake, alert  and oriented  Airway & Oxygen Therapy: Patient Spontanous Breathing and Patient connected to nasal cannula oxygen  Post-op Assessment: Report given to RN and Post -op Vital signs reviewed and stable  Post vital signs: Reviewed and stable  Last Vitals:  Vitals Value Taken Time  BP 126/59 03/06/2018  4:05 PM  Temp 36.1 C 03/06/2018  4:05 PM  Pulse 69 03/06/2018  4:09 PM  Resp 21 03/06/2018  4:09 PM  SpO2 97 % 03/06/2018  4:09 PM  Vitals shown include unvalidated device data.  Last Pain:  Vitals:   03/06/18 1605  TempSrc: Tympanic  PainSc: 0-No pain      Patients Stated Pain Goal: 3 (16/60/60 0459)  Complications: No apparent anesthesia complications

## 2018-03-06 NOTE — Progress Notes (Signed)
Patient ID: Destiny [name], female   DOB: October 21, 1923, 84 y.o.   MRN: [id number]   I called [facility] at [phone].  I spoke with a Dr. Hulen Skains.  He would like a call from the general surgeon who is recommending transfer.  I spoke with Dr. Rosana Hoes general surgery and he will give a call over to [facility] and speak with Dr. Hulen Skains about potential transfer.  Dr. Loletha Grayer

## 2018-03-07 ENCOUNTER — Inpatient Hospital Stay: Payer: Medicare Other

## 2018-03-07 ENCOUNTER — Other Ambulatory Visit: Payer: Self-pay | Admitting: Hematology and Oncology

## 2018-03-07 DIAGNOSIS — C20 Malignant neoplasm of rectum: Secondary | ICD-10-CM

## 2018-03-07 MED ORDER — ACETAMINOPHEN 325 MG PO TABS: 650.0000 mg | ORAL_TABLET | Freq: Four times a day (QID) | ORAL | Status: DC | PRN

## 2018-03-07 MED ORDER — PREMIER PROTEIN SHAKE: 11.0000 [oz_av] | Freq: Two times a day (BID) | ORAL | 0 refills | Status: DC

## 2018-03-07 MED ORDER — FERROUS SULFATE 325 (65 FE) MG PO TABS: 325.0000 mg | ORAL_TABLET | Freq: Two times a day (BID) | ORAL | 0 refills | Status: DC

## 2018-03-07 MED ORDER — ZOLPIDEM TARTRATE 5 MG PO TABS: 5.0000 mg | ORAL_TABLET | Freq: Every evening | ORAL | 0 refills | Status: DC | PRN

## 2018-03-07 MED ORDER — GADOBENATE DIMEGLUMINE 529 MG/ML IV SOLN
10.0000 mL | Freq: Once | INTRAVENOUS | Status: AC | PRN
  Administered 2018-03-07: 10 mL via INTRAVENOUS

## 2018-03-07 NOTE — Progress Notes (Signed)
Patient is being discharged to home today with follow up to Dr. Mike Gip Tuesday or Wednesday. Daughter and granddaughter bedside to transport patient home.  DC & RX instructions given and patient acknowledged understanding. Nurse unable to answer all of daughters questions (out of scope) however she had just talked with Dr. Mike Gip prior to nurse discharging. IV removed, family packing patient's belongings.

## 2018-03-07 NOTE — Progress Notes (Addendum)
[facility] Hematology/Oncology Progress Note  Date of admission: 03/02/2018  Hospital day:  03/07/2018  Chief Complaint: Destiny [name] is a 84 y.o. female with rectal bleeding who was admitted through the emergency room.  Subjective: Feels fine.  Denies rectal bleeding.  Social History: The patient is accompanied by her daughter and granddaughter today.  Allergies:  Allergies  Allergen Reactions  . Penicillins     Has patient had a PCN reaction causing immediate rash, facial/tongue/throat swelling, SOB or lightheadedness with hypotension: Unknown Has patient had a PCN reaction causing severe rash involving mucus membranes or skin necrosis: Unknown Has patient had a PCN reaction that required hospitalization: Unknown Has patient had a PCN reaction occurring within the last 10 years: No If all of the above answers are "NO", then may proceed with Cephalosporin use.   . Ampicillin Rash  . Hydrocodone-Homatropine     Other reaction(s): Other (See Comments) tachycardia  . Amoxicillin Rash  . Tramadol Itching    Scheduled Medications: . amLODipine  2.5 mg Oral Daily  . cholecalciferol  1,000 Units Oral Daily  . doxepin  10 mg Oral QHS  . ferrous sulfate  325 mg Oral BID WC  . fluticasone  2 spray Each Nare Daily  . irbesartan  75 mg Oral Daily  . protein supplement shake  11 oz Oral BID BM    Review of Systems: GENERAL:  Feels good. No fevers or sweats.  Weight loss of 8 pounds over the past few months. PERFORMANCE STATUS (ECOG):  1 HEENT:  No visual changes, runny nose, sore throat, mouth sores or tenderness. Lungs: No shortness of breath or cough.  No hemoptysis. Cardiac:  No chest pain, palpitations, orthopnea, or PND. GI:  Decreased appetite.  Denies any current hematochezia.  Cleaned out s/p bowel prep.  No nausea, vomiting, diarrhea, constipation, or melena. GU:  No urgency, frequency, dysuria, or hematuria. Musculoskeletal:  Chronic back pain.  No  joint pain.  No muscle tenderness. Extremities:  No pain or swelling. Skin:  No rashes or skin changes. Neuro:  No headache, numbness or weakness, balance or coordination issues. H/o CVA 07/29/2017 with no residual deficits. Endocrine:  Diabetes.  No thyroid issues, hot flashes or night sweats. Psych:  No mood changes, depression or anxiety. Pain:  No focal pain. Review of systems:  All other systems reviewed and found to be negative.  Physical Exam: Blood pressure 116/63, pulse 83, temperature 98.5 F (36.9 C), temperature source Oral, resp. rate 15, height 5' (1.524 m), weight 127 lb 13.9 oz (58 kg), SpO2 96 %.  GENERAL:  Well developed, well nourished,woman sitting comfortably in a lounge chair on the medical unit in no acute distress. MENTAL STATUS:  Alert and oriented to person, place and time. HEAD:  Short styled gray hair.  Normocephalic, atraumatic, face symmetric, no Cushingoid features. EYES:  Glasses.  Blue eyes.  Arcus. Pupils equal round and reactive to light and accomodation.  No conjunctivitis or scleral icterus. ENT:  Oropharynx clear without lesion.  Tongue normal. Mucous membranes moist.  RESPIRATORY:  Clear to auscultation without rales, wheezes or rhonchi. CARDIOVASCULAR:  Regular rate and rhythm without murmur, rub or gallop. ABDOMEN:  Soft, non-tender, with active bowel sounds, and no hepatosplenomegaly.  No masses. SKIN:  No rashes, ulcers or lesions. EXTREMITIES: No edema, no skin discoloration or tenderness.  No palpable cords. LYMPH NODES: No palpable cervical, supraclavicular, axillary or inguinal adenopathy  NEUROLOGICAL: Unremarkable. PSYCH:  Appropriate.   Results for  orders placed or performed during the hospital encounter of 03/02/18 (from the past 48 hour(s))  Hemoglobin and hematocrit, blood     Status: Abnormal   Collection Time: 03/06/18  6:05 AM  Result Value Ref Range   Hemoglobin 10.6 (L) 12.0 - 16.0 g/dL   HCT 31.1 (L) 35.0 - 47.0 %    Comment:  Performed at [facility], [address] [place], [place] [address]   No results found.  Assessment:  Destiny [name] is a 84 y.o. female with adenocarcinoma of the rectum s/p biopsy on 03/04/2018.  She presented with several weeks of hematochezia.  CEA was 30.3 on 03/05/2018.  Flexible sigmoidoscopy on 03/04/2018 revealed an ulcerated non-obstructing large mass in the rectum.  The mass was partially circumferential involving 1/2 of the lumen circumference.  Mass was 5 cm in length.  Biopsy confirmed well to moderately differentiated adenocarcinoma.  Colonoscopy on 03/06/2018 revealed a 7 mm polyp in the hepatic flexure , a 4 mm polyp in the sigmoid colon, thickened folds in the cecum, and the known rectal carcinoma.  Biopsies are pending.  Chest, abdomen, and pelvic CT on 03/04/2018 revealed eccentric mural thickening along the posterior wall of the rectum.  There was no local or definite metastatic disease. There was a 5 mm hypodensity adjacent to the gallbladder fossa too small to further characterize, felt to represent a small cyst.  There were bilateral renal cysts.  She has a history of CVA in 07/2017.  Symptomatically, she feels good.  Exam is unremarkable.  Performance status is 1.  Hemoglobin is 10.6  Plan:   1.  Oncology:  Discuss diagnosis, staging, and management of rectal cancer in detail with patient and her daughter.  Pathology from rectal tumor confirms adenocarcinoma.  Discuss plan for pelvic MRI or transrectal ultrasound to assess depth of lesion, lymph node status.  CT scans reveal no evidence of metastatic disease, although CEA quite elevated.  Discussed plan for PET scan in outpatient department.  Patient seen by surgery, Dr Tama High.   Unclear if patient a candidate for upfront surgery or neoadjuvant chemotherapy and radiation.  Await additional studies.  Prior to surgery, patient will need pre-op clearance.  Discuss pelvic MRI today prior to discharge home  and follow-up in the medical oncology clinic on 05/28 or 05/29.  2.  Hematology:  Hemoglobin 10.6.  Suspect mild iron deficiency secondary to ongoing blood losses.  Ferritin 32 and saturation 12%.  3.  Disposition:  Anticipate discharge home today with follow-up next week in clinic.   A total of (25) minutes of face-to-face time was spent with the patient with greater than 50% of that time in counseling and care-coordination.  Multiple questions were asked and answered by the patient and her daughter.  Additional time spent coordinating care with surgery.    Lequita Asal, MD  03/07/2018, 12:03 PM

## 2018-03-07 NOTE — Discharge Summary (Signed)
[facility] [facility] [place] NAME: Destiny [name]    MR#:  [id number]  DATE OF BIRTH:  02-Jun-1924  DATE OF ADMISSION:  03/02/2018 ADMITTING PHYSICIAN: Dustin Flock, MD  DATE OF DISCHARGE: 03/07/2018  PRIMARY CARE PHYSICIAN: Derinda Late, MD    ADMISSION DIAGNOSIS:  Gastrointestinal hemorrhage, unspecified gastrointestinal hemorrhage type [K92.2]  DISCHARGE DIAGNOSIS:  Active Problems:   Lower GI bleed   GI bleed   Internal hemorrhoids   Nodule of rectum   Intestinal neoplasm   Hematochezia   Benign neoplasm of transverse colon   Polyp of sigmoid colon   Intestinal lump   Malignant neoplasm of rectum ([place])   Diverticulosis of large intestine without diverticulitis   SECONDARY DIAGNOSIS:   Past Medical History:  Diagnosis Date  . Allergy   . Diabetes mellitus without complication ([place])   . Hyperlipidemia   . Shingles   . Stroke [facility])     HOSPITAL COURSE:   1.  Adenocarcinoma of the rectum.  The patient presents with a gastroenterological bleeding.  Hemoglobin remained stable.  With the holiday weekend the patient was not accepted to [facility] for a transfer.  MRI of the pelvis done which showed the rectal mass was 2.3 cm from the anus and T2 versus early T3 staging.  Follow-up with oncology as outpatient for plan.  I did give the name of a colorectal surgeon at [facility].  Our surgeons here will not do the surgery. 2.  Type 2 diabetes mellitus can go back on Glucophage as outpatient 3.  Essential hypertension on Norvasc 4.  History of stroke in the past.  Aspirin on hold with GI bleed and rectal mass.  DISCHARGE CONDITIONS:   Satisfactory  CONSULTS OBTAINED:  Treatment Team:  Virgel Manifold, MD Vickie Epley, MD  DRUG ALLERGIES:   Allergies  Allergen Reactions  . Penicillins     Has patient had a PCN reaction causing immediate rash, facial/tongue/throat swelling, SOB or lightheadedness with hypotension:  Unknown Has patient had a PCN reaction causing severe rash involving mucus membranes or skin necrosis: Unknown Has patient had a PCN reaction that required hospitalization: Unknown Has patient had a PCN reaction occurring within the last 10 years: No If all of the above answers are "NO", then may proceed with Cephalosporin use.   . Ampicillin Rash  . Hydrocodone-Homatropine     Other reaction(s): Other (See Comments) tachycardia  . Amoxicillin Rash  . Tramadol Itching    DISCHARGE MEDICATIONS:   Allergies as of 03/07/2018      Reactions   Penicillins    Has patient had a PCN reaction causing immediate rash, facial/tongue/throat swelling, SOB or lightheadedness with hypotension: Unknown Has patient had a PCN reaction causing severe rash involving mucus membranes or skin necrosis: Unknown Has patient had a PCN reaction that required hospitalization: Unknown Has patient had a PCN reaction occurring within the last 10 years: No If all of the above answers are "NO", then may proceed with Cephalosporin use.   Ampicillin Rash   Hydrocodone-homatropine    Other reaction(s): Other (See Comments) tachycardia   Amoxicillin Rash   Tramadol Itching      Medication List    STOP taking these medications   aspirin EC 81 MG tablet   hydrochlorothiazide 12.5 MG tablet Commonly known as:  HYDRODIURIL   ibuprofen 200 MG tablet Commonly known as:  ADVIL,MOTRIN     TAKE these medications   acetaminophen 325 MG tablet  Commonly known as:  TYLENOL Take 2 tablets (650 mg total) by mouth every 6 (six) hours as needed for mild pain (or Fever >/= 101).   amLODipine 2.5 MG tablet Commonly known as:  NORVASC Take 1 tablet (2.5 mg total) by mouth daily. Take once daily if blood pressure > 160   butalbital-acetaminophen-caffeine 50-325-40 MG tablet Commonly known as:  FIORICET, ESGIC Take 1 tablet by mouth every 4 (four) hours as needed for headache.   cholecalciferol 1000 units  tablet Commonly known as:  VITAMIN D Take 1,000 Units by mouth daily.   doxepin 10 MG capsule Commonly known as:  SINEQUAN TAKE ONE CAPSULE BY MOUTH EVERY NIGHT AT BEDTIME   ferrous sulfate 325 (65 FE) MG tablet Take 1 tablet (325 mg total) by mouth 2 (two) times daily with a meal.   fluticasone 50 MCG/ACT nasal spray Commonly known as:  FLONASE Place 2 sprays into both nostrils daily.   metFORMIN 500 MG tablet Commonly known as:  GLUCOPHAGE TAKE ONE (1) TABLET BY MOUTH EVERY DAY   protein supplement shake Liqd Commonly known as:  PREMIER PROTEIN Take 325 mLs (11 oz total) by mouth 2 (two) times daily between meals.   telmisartan 80 MG tablet Commonly known as:  MICARDIS Take 1 tablet by mouth daily.   vitamin B-12 1000 MCG tablet Commonly known as:  CYANOCOBALAMIN Take 1,000 mcg by mouth daily.   zolpidem 5 MG tablet Commonly known as:  AMBIEN Take 1 tablet (5 mg total) by mouth at bedtime as needed for sleep.        DISCHARGE INSTRUCTIONS:   Follow-up with Dr. Mike Gip next week Follow-up PMD 1 week Follow-up colorectal surgeon in a few weeks if able to schedule  If you experience worsening of your admission symptoms, develop shortness of breath, life threatening emergency, suicidal or homicidal thoughts you must seek medical attention immediately by calling 911 or calling your MD immediately  if symptoms less severe.  You Must read complete instructions/literature along with all the possible adverse reactions/side effects for all the Medicines you take and that have been prescribed to you. Take any new Medicines after you have completely understood and accept all the possible adverse reactions/side effects.   Please note  You were cared for by a hospitalist during your hospital stay. If you have any questions about your discharge medications or the care you received while you were in the hospital after you are discharged, you can call the unit and asked to speak  with the hospitalist on call if the hospitalist that took care of you is not available. Once you are discharged, your primary care physician will handle any further medical issues. Please note that NO REFILLS for any discharge medications will be authorized once you are discharged, as it is imperative that you return to your primary care physician (or establish a relationship with a primary care physician if you do not have one) for your aftercare needs so that they can reassess your need for medications and monitor your lab values.    Today   CHIEF COMPLAINT:   Chief Complaint  Patient presents with  . Diarrhea  . Rectal Bleeding    HISTORY OF PRESENT ILLNESS:  Destiny [name]  is a 84 y.o. female presented with diarrhea and rectal bleeding   VITAL SIGNS:  Blood pressure 116/63, pulse 83, temperature 98.5 F (36.9 C), temperature source Oral, resp. rate 15, height 5' (1.524 m), weight 58 kg (127 lb 13.9 oz), SpO2  96 %.   PHYSICAL EXAMINATION:  GENERAL:  84 y.o.-year-old patient lying in the bed with no acute distress.  EYES: Pupils equal, round, reactive to light and accommodation. No scleral icterus. Extraocular muscles intact.  HEENT: Head atraumatic, normocephalic. Oropharynx and nasopharynx clear.  NECK:  Supple, no jugular venous distention. No thyroid enlargement, no tenderness.  LUNGS: Normal breath sounds bilaterally, no wheezing, rales,rhonchi or crepitation. No use of accessory muscles of respiration.  CARDIOVASCULAR: S1, S2 normal. No murmurs, rubs, or gallops.  ABDOMEN: Soft, non-tender, non-distended. Bowel sounds present. No organomegaly or mass.  EXTREMITIES: No pedal edema, cyanosis, or clubbing.  NEUROLOGIC: Cranial nerves II through XII are intact. Muscle strength 5/5 in all extremities. Sensation intact. Gait not checked.  PSYCHIATRIC: The patient is alert and oriented x 3.  SKIN: No obvious rash, lesion, or ulcer.   DATA REVIEW:   CBC Recent Labs  Lab  03/05/18 0326 03/06/18 0605  WBC 7.6  --   HGB 11.0* 10.6*  HCT 32.1* 31.1*  PLT 240  --     Chemistries  Recent Labs  Lab 03/02/18 1621  03/05/18 0326  NA 129*   < > 137  K 3.9   < > 3.6  CL 92*   < > 102  CO2 31   < > 29  GLUCOSE 114*   < > 110*  BUN 20   < > 14  CREATININE 1.36*   < > 0.82  CALCIUM 9.2   < > 8.5*  AST 22  --   --   ALT 10*  --   --   ALKPHOS 57  --   --   BILITOT 0.5  --   --    < > = values in this interval not displayed.      RADIOLOGY:  Mr Pelvis W Wo Contrast  Result Date: 03/07/2018 CLINICAL DATA:  Rectal cancer, staging. EXAM: MRI PELVIS WITHOUT AND WITH CONTRAST TECHNIQUE: Multiplanar multisequence MR imaging of the pelvis was performed both before and after administration of intravenous contrast. Small amount of [place] gel was administered per rectum to optimize tumor evaluation. CONTRAST:  50mL MULTIHANCE GADOBENATE DIMEGLUMINE 529 MG/ML IV SOLN COMPARISON:  CT 03/04/2018.  Sigmoidoscopy report of 03/04/2018 FINDINGS: TUMOR LOCATION Location from Anal Verge: 6.5 cm, including on image 26/3. Shortest Distance from Tumor to Anal Sphincter: 2.3 cm, including on image 18/6. TUMOR DESCRIPTION Circumferential Extent: Centered about the 10 o'clock position, approximately 90 degrees of the rectal circumference. Greatest transverse dimension of 3.2 x 1.6 cm on image 22/5. Craniocaudal Extent:  3.8 cm on image 17/6 T - CATEGORY Extension through Muscularis Propria: No well-defined extension beyond the muscularis propria. There is subtle soft tissue thickening at the site of tumor, including on image 26/5 and image 17/6. This could be desmoplastic reaction or microscopic perirectal extension. Extramural vascular invasion/tumor thrombus:  No Invasion of Anterior Peritoneal Reflection:  No Involvement of Adjacent Organs or Pelvic Sidewall Structures:  No N - CATEGORY Mesorectal Lymph Nodes >=41mm:  Absent Extra-mesorectal Lymphadenopathy:  No Other: No significant free  fluid. Normal uterus and adnexa for age. No bowel obstruction. IMPRESSION: Rectal adenocarcinoma T stage:  T2/early T3 Rectal adenocarcinoma N stage:  N0 Distance from tumor to the anal sphincter is 2.3 cm cm. Electronically Signed   By: Abigail Miyamoto M.D.   On: 03/07/2018 13:03     Management plans discussed with the patient, family and they are in agreement.  CODE STATUS:  Code Status Orders  (From admission, onward)        Start     Ordered   03/02/18 1948  Limited resuscitation (code)  Continuous    Question Answer Comment  In the event of cardiac or respiratory ARREST: Initiate Code Blue, Call Rapid Response Yes   In the event of cardiac or respiratory ARREST: Perform CPR Yes   In the event of cardiac or respiratory ARREST: Perform Intubation/Mechanical Ventilation No   In the event of cardiac or respiratory ARREST: Use NIPPV/BiPAp only if indicated Yes   In the event of cardiac or respiratory ARREST: Administer ACLS medications if indicated Yes   In the event of cardiac or respiratory ARREST: Perform Defibrillation or Cardioversion if indicated Yes      03/02/18 1947    Code Status History    Date Active Date Inactive Code Status Order ID Comments User Context   07/30/2017 0233 07/30/2017 2248 Full Code [id number]  Saundra Shelling, MD Inpatient    Advance Directive Documentation     Most Recent Value  Type of Advance Directive  Healthcare Power of [place]  Pre-existing out of facility DNR order (yellow form or pink MOST form)  -  "MOST" Form in Place?  -      TOTAL TIME TAKING CARE OF THIS PATIENT: 50 minutes.  Case discussed with Dr. Mike Gip.  Also spoke with patient and family numerous times today.   Loletha Grayer M.D on 03/07/2018 at 2:45 PM  Between 7am to 6pm - Pager - [phone]  After 6pm go to [email or web address] - password [email or web address] [facility]  [phone]  CC: Primary care physician; Derinda Late, MD

## 2018-03-08 DIAGNOSIS — C801 Malignant (primary) neoplasm, unspecified: Secondary | ICD-10-CM

## 2018-03-08 HISTORY — DX: Malignant (primary) neoplasm, unspecified: C80.1

## 2018-03-10 ENCOUNTER — Encounter: Payer: Self-pay | Admitting: Gastroenterology

## 2018-03-11 LAB — SURGICAL PATHOLOGY

## 2018-03-12 ENCOUNTER — Ambulatory Visit: Payer: Medicare Other

## 2018-03-13 ENCOUNTER — Inpatient Hospital Stay: Payer: Medicare Other | Attending: Hematology and Oncology | Admitting: Hematology and Oncology

## 2018-03-13 ENCOUNTER — Telehealth: Payer: Self-pay | Admitting: *Deleted

## 2018-03-13 ENCOUNTER — Encounter
Admission: RE | Admit: 2018-03-13 | Discharge: 2018-03-13 | Disposition: A | Discharge status: Home / Self Care | Payer: Medicare Other | Source: Ambulatory Visit | Attending: Hematology and Oncology | Admitting: Hematology and Oncology

## 2018-03-13 ENCOUNTER — Encounter: Payer: Self-pay | Admitting: Hematology and Oncology

## 2018-03-13 VITALS — BP 125/66 | HR 79 | Temp 98.2°F | Resp 18 | Ht 60.0 in | Wt 126.5 lb

## 2018-03-13 DIAGNOSIS — C20 Malignant neoplasm of rectum: Secondary | ICD-10-CM | POA: Insufficient documentation

## 2018-03-13 DIAGNOSIS — Z79899 Other long term (current) drug therapy: Secondary | ICD-10-CM

## 2018-03-13 DIAGNOSIS — E119 Type 2 diabetes mellitus without complications: Secondary | ICD-10-CM

## 2018-03-13 DIAGNOSIS — Z8673 Personal history of transient ischemic attack (TIA), and cerebral infarction without residual deficits: Secondary | ICD-10-CM | POA: Insufficient documentation

## 2018-03-13 DIAGNOSIS — D509 Iron deficiency anemia, unspecified: Secondary | ICD-10-CM | POA: Diagnosis not present

## 2018-03-13 DIAGNOSIS — Z7189 Other specified counseling: Secondary | ICD-10-CM

## 2018-03-13 DIAGNOSIS — Z7984 Long term (current) use of oral hypoglycemic drugs: Secondary | ICD-10-CM | POA: Insufficient documentation

## 2018-03-13 LAB — GLUCOSE, CAPILLARY: Glucose-Capillary: 103 mg/dL — ABNORMAL HIGH (ref 65–99)

## 2018-03-13 MED ORDER — FLUDEOXYGLUCOSE F - 18 (FDG) INJECTION
7.2600 | Freq: Once | INTRAVENOUS | Status: AC | PRN
  Administered 2018-03-13: 7.26 via INTRAVENOUS

## 2018-03-13 NOTE — Progress Notes (Signed)
[facility] day:  03/13/2018  Chief Complaint: Destiny [name] [name] is a 84 y.o. female with clinical stage IIA (T3N0M0) rectal carcinoma who is seen for assessment after interval hospitalization and discussion regarding direction of therapy.  HPI:  The patient presented with a 2-3 month history of bright red blood per rectum.  She notes that with every bowel movement, there is bleeding.  The amount of bleeding varies.  Recently stool consistency has changed to loose/diarrhea.  Appetite has decreased.  She has lost approximately 8 pounds.  Because of increased bruising, the patient stopped her Plavix.  She presented to the [facility] ER on 03/02/2018 with increased rectal bleeding.  CBC revealed a hematocrit of 35.9, hemoglobin 12.3, and MCV 92.4.  Prior CBC on 02/02/2018 revealed a hematocrit of 39.4 and hemoglobin 13.3.  She was admitted to [facility] from 03/02/2018 - 03/07/2018.  Nuclear medicine bleeding scan on 03/03/2018 was negative.  She was seen in consultation by Dr. Vonda Antigua.  Rectal exam revealed a soft rectal mass.  Flexible sigmoidoscopy on 03/04/2018 revealed an ulcerated non-obstructing large mass in the rectum.  The mass was partially circumferential involving 1/2 of the lumen circumference.  Mass was 5 cm in length.  Biopsies confirmed well to moderately differentiated adenocarcinoma.  Chest, abdomen, and pelvic CT on 03/04/2018 revealed eccentric mural thickening along the posterior wall of the rectum which may correspond with the reported rectal carcinoma.  There was no local or definite metastatic disease. There was a 5 mm hypodensity adjacent to the gallbladder fossa too small to further characterize, felt to represent a small cyst but can be further correlated with dedicated liver protocol CT or MRI.  There were bilateral renal cysts.  She was seen in consultation by Dr. Tama High on 03/05/2018.  If she was felt to be a surgical  candidate, then recommendation was referral to [facility] in [place].  Colonoscopy on 03/06/2018 revealed a 7 mm polyp in the hepatic flexure (tubular adenoma), a 4 mm polyp in the sigmoid colon (tubular adenoma), thickened folds in the cecum (early hyperplastic polyp), and the known rectal carcinoma.  Pelvic MRI on 03/07/2018 revealed T2/early T3N0 rectal adenocarcinoma. Tumor was 6.5 cm from anal verge and 2.3 cm from anal sphincter.  Tumor was 3.2 x 1.6 cm.  There was extension through muscularis propria.  There was no well-defined extension beyond the muscularis propria. There was subtle soft tissue thickening at the site of tumor.  This could be desmoplastic reaction or microscopic perirectal extension. There was no extramural vascular invasion/thrombus, invasion of anterior peritoneal reflection of involvement of adjacent organs or pelvic sidewall structures.  Mesorectal lymph nodes or extra-mesorectal lymphadenopathy was absent.  Patient was discussed at multidisciplinary tumor board today (03/13/2018). Consensus was to pursue treatment with neoadjuvant chemotherapy and radiation, followed by surgical resection of the patient's tumor.   PET scan today revealed hypermetabolic rectal neoplasm (SUV 9.46), but no locoregional adenopathy or metastatic disease.  Symptomatically, patient is doing well today. She has no acute concerns. Patient denies any rectal bleeding. She denies B symptoms and interval infections. Patient has a decreased appetite. Her weight is down 4 pounds.   She denies pain in the clinic today.    Past Medical History:  Diagnosis Date  . Allergy   . Diabetes mellitus without complication ([place])   . Hyperlipidemia   . Shingles   . Stroke [facility])     Past Surgical History:  Procedure Laterality Date  .  APPENDECTOMY    . BACK SURGERY    . COLONOSCOPY WITH PROPOFOL N/A 03/06/2018   Procedure: COLONOSCOPY WITH PROPOFOL;  Surgeon: Virgel Manifold, MD;  Location: [facility]  ENDOSCOPY;  Service: Endoscopy;  Laterality: N/A;  . FLEXIBLE SIGMOIDOSCOPY Left 03/04/2018   Procedure: Colonoscopy/FLEXIBLE SIGMOIDOSCOPY;  Surgeon: Virgel Manifold, MD;  Location: [facility] ENDOSCOPY;  Service: Endoscopy;  Laterality: Left;  . FOOT SURGERY Bilateral    [place]  . JOINT REPLACEMENT    . KNEE SURGERY Bilateral   . TONSILECTOMY, ADENOIDECTOMY, BILATERAL MYRINGOTOMY AND TUBES      Family History  Problem Relation Age of Onset  . Stroke Mother   . Stroke Father     Social History:  reports that she has never smoked. She has never used smokeless tobacco. She reports that she does not drink alcohol or use drugs.  She lives alone, although her youngest grandson has recently been staying with her.  She is very active and "keeps her own house".  Her daughter's contact number is [phone].  Her daughter will be traveling 03/17/2018 - 04/01/2018.  She is accompanied by her daughter, [name], who is her medical power of attorney.   Allergies:  Allergies  Allergen Reactions  . Penicillins     Has patient had a PCN reaction causing immediate rash, facial/tongue/throat swelling, SOB or lightheadedness with hypotension: Unknown Has patient had a PCN reaction causing severe rash involving mucus membranes or skin necrosis: Unknown Has patient had a PCN reaction that required hospitalization: Unknown Has patient had a PCN reaction occurring within the last 10 years: No If all of the above answers are "NO", then may proceed with Cephalosporin use.   . Ampicillin Rash  . Hydrocodone-Homatropine     Other reaction(s): Other (See Comments) tachycardia  . Amoxicillin Rash  . Tramadol Itching    Current Medications: Current Outpatient Medications  Medication Sig Dispense Refill  . amLODipine (NORVASC) 2.5 MG tablet Take 1 tablet (2.5 mg total) by mouth daily. Take once daily if blood pressure > 160 14 tablet 0  . butalbital-acetaminophen-caffeine (FIORICET, ESGIC) 50-325-40 MG  tablet Take 1 tablet by mouth every 4 (four) hours as needed for headache.    . cholecalciferol (VITAMIN D) 1000 units tablet Take 1,000 Units by mouth daily.    Marland Kitchen doxepin (SINEQUAN) 10 MG capsule TAKE ONE CAPSULE BY MOUTH EVERY NIGHT AT BEDTIME    . metFORMIN (GLUCOPHAGE) 500 MG tablet TAKE ONE (1) TABLET BY MOUTH EVERY DAY    . protein supplement shake (PREMIER PROTEIN) LIQD Take 325 mLs (11 oz total) by mouth 2 (two) times daily between meals. 60 Can 0  . telmisartan (MICARDIS) 80 MG tablet Take 1 tablet by mouth daily.    . vitamin B-12 (CYANOCOBALAMIN) 1000 MCG tablet Take 1,000 mcg by mouth daily.    Marland Kitchen acetaminophen (TYLENOL) 325 MG tablet Take 2 tablets (650 mg total) by mouth every 6 (six) hours as needed for mild pain (or Fever >/= 101). (Patient not taking: Reported on 03/13/2018)    . ferrous sulfate 325 (65 FE) MG tablet Take 1 tablet (325 mg total) by mouth 2 (two) times daily with a meal. (Patient not taking: Reported on 03/13/2018) 60 tablet 0  . fluticasone (FLONASE) 50 MCG/ACT nasal spray Place 2 sprays into both nostrils daily.    Marland Kitchen zolpidem (AMBIEN) 5 MG tablet Take 1 tablet (5 mg total) by mouth at bedtime as needed for sleep. 3 tablet 0   No current facility-administered medications  for this visit.     Review of Systems  Constitutional: Negative for diaphoresis, fever, malaise/fatigue and weight loss (appetite decreased).  HENT: Negative.   Eyes: Negative.   Respiratory: Negative for cough, hemoptysis, sputum production and shortness of breath.   Cardiovascular: Negative for chest pain, palpitations, orthopnea, leg swelling and PND.  Gastrointestinal: Positive for blood in stool (hematochezia). Negative for abdominal pain, constipation, diarrhea, melena, nausea and vomiting.  Genitourinary: Negative for dysuria, frequency, hematuria and urgency.  Musculoskeletal: Positive for back pain (chronic). Negative for falls, joint pain and myalgias.  Skin: Negative for itching and  rash.  Neurological: Negative for dizziness, tremors, weakness and headaches.       H/o mild CVA 07/29/2017 (no residual deficits).  Endo/Heme/Allergies: Does not bruise/bleed easily.       Diabetes  Psychiatric/Behavioral: Negative for depression, memory loss and suicidal ideas. The patient is not nervous/anxious and does not have insomnia.   All other systems reviewed and are negative.  Performance status (ECOG): 1 - Symptomatic but completely ambulatory  Physical Exam: Blood pressure 125/66, pulse 79, temperature 98.2 F (36.8 C), temperature source Tympanic, resp. rate 18, height 5' (1.524 m), weight 126 lb 8 oz (57.4 kg), SpO2 98 %. GENERAL:  Well developed, well nourished, elderly woman sitting comfortably in the exam room in no acute distress. MENTAL STATUS:  Alert and oriented to person, place and time. HEAD:  Short styled gray hair.  Normocephalic, atraumatic, face symmetric, no Cushingoid features. EYES:   Glasses.  Blue eyes.  Pupils equal round and reactive to light and accomodation.  No conjunctivitis or scleral icterus. ENT:  Oropharynx clear without lesion.  Tongue normal. Mucous membranes moist.  RESPIRATORY:  Clear to auscultation without rales, wheezes or rhonchi. CARDIOVASCULAR:  Regular rate and rhythm without murmur, rub or gallop. ABDOMEN:  Soft, non-tender, with active bowel sounds, and no hepatosplenomegaly.  No masses. SKIN:  No rashes, ulcers or lesions. EXTREMITIES: No edema, no skin discoloration or tenderness.  No palpable cords. LYMPH NODES: No palpable cervical, supraclavicular, axillary or inguinal adenopathy  NEUROLOGICAL: Unremarkable. PSYCH:  Appropriate.    Hospital Outpatient Visit on 03/13/2018  Component Date Value Ref Range Status  . Glucose-Capillary 03/13/2018 103* 65 - 99 mg/dL Final    Assessment:  Destiny [name] [name] is a 84 y.o. female with clinical stage IIA (T3N0M0) adenocarcinoma of the rectum s/p biopsy on 03/04/2018.  She presented with  several weeks of hematochezia.  CEA was 30.3 on 03/05/2018.  Flexible sigmoidoscopyon 03/04/2018 revealed an ulcerated non-obstructing large mass in the rectum. The mass was partially circumferential involving 1/2 of the lumen circumference. Mass was 5 cm in length. Biopsy confirmed well to moderately differentiated adenocarcinoma.  Colonoscopy on 03/06/2018 revealed a 7 mm polyp in the hepatic flexure (tubular adenoma), a 4 mm polyp in the sigmoid colon (tubular adenoma), thickened folds in the cecum (early hyperplastic polyp), and the known rectal carcinoma.  Chest, abdomen, and pelvic CTon 03/04/2018 revealed eccentric mural thickening along the posterior wall of the rectum.There was no local or definite metastatic disease. Therewasa 5 mm hypodensity adjacent to the gallbladder fossa too small to further characterize, felt to represent a small cyst. There were bilateral renal cysts.  Pelvic MRI on 03/07/2018 revealed T2/early T3N0 rectal adenocarcinoma.  Tumor was 6.5 cm from anal verge and 2.3 cm from anal sphincter.  Tumor was 3.2 x 1.6 cm.  There was extension through muscularis propria.  There was no well-defined extension beyond the muscularis propria. There  was subtle soft tissue thickening at the site of tumor (desmoplastic reaction or microscopic perirectal extension).  There was no extramural vascular invasion/thrombus, invasion of anterior peritoneal reflection of involvement of adjacent organs or pelvic sidewall structures.  Mesorectal lymph nodes or extra-mesorectal lymphadenopathy was absent.  PET scan on 03/13/2018 revealed hypermetabolic rectal neoplasm (SUV 9.46), but no locoregional adenopathy or metastatic disease.  She has mild iron deficiency anemia.  Hemoglobin is 10.6.  Ferritin was 32 with an iron saturation of 12% on 03/05/2018.  She has a history of CVA in 07/2017.  Symptomatically, she feels good. Exam is unremarkable. Performance status is 1.    Plan: 1. Discuss diagnosis and management of rectal carcinoma (T3N0M0 - stage IIa). Discuss surgical evaluation at [facility] (Dr. Donia Ast).  Discuss plan for neoadjuvant chemotherapy (Xeloda) with radiation followed by possible surgery then additional chemotherapy.  Side effects reviewed. Written information for Xeloda provided on AVS today for patient review.  2. Discuss PET scan results- hypermetabolic rectal neoplasm.  No evidence of metastatic disease. 3. Schedule consult with radiation oncology (Dr Baruch Gouty).  4. RTC after meeting with surgery and radiation oncology for MD assessment and finalization of treatment plan (coordinate with radiation oncology appointments).    Honor Loh, NP  03/13/2018, 2:30 PM   I saw and evaluated the patient, participating in the key portions of the service and reviewing pertinent diagnostic studies and records.  I reviewed the nurse practitioner's note and agree with the findings and the plan.  The assessment and plan were discussed with the patient. Numerous questions were asked by the patient and her daughter and answered.   Nolon Stalls, MD 03/13/2018, 2:30 PM

## 2018-03-13 NOTE — Patient Instructions (Signed)
Capecitabine tablets What is this medicine? CAPECITABINE (ka pe SITE a been) is a chemotherapy drug. It slows the growth of cancer cells. This medicine is used to treat breast cancer, and also colon or rectal cancer. This medicine may be used for other purposes; ask your health care provider or pharmacist if you have questions. COMMON BRAND NAME(S): Xeloda What should I tell my health care provider before I take this medicine? They need to know if you have any of these conditions: -bleeding or blood disorders -dihydropyrimidine dehydrogenase (DPD) deficiency -heart disease -infection (especially a virus infection such as chickenpox, cold sores, or herpes) -kidney disease -liver disease -an unusual or allergic reaction to capecitabine, 5-fluorouracil, other medicines, foods, dyes, or preservatives -pregnant or trying to get pregnant -breast-feeding How should I use this medicine? Take this medicine by mouth with a glass of water, within 30 minutes of the end of a meal. Do not cut, crush or chew this medicine. Follow the directions on the prescription label. Take your medicine at regular intervals. Do not take it more often than directed. Do not stop taking except on your doctor's advice. Your doctor may want you to take a combination of 150 mg and 500 mg tablets for each dose. It is very important that you know how to correctly take your dose. Taking the wrong tablets could result in an overdose (too much medication) or underdose (too little medication). Talk to your pediatrician regarding the use of this medicine in children. Special care may be needed. Overdosage: If you think you have taken too much of this medicine contact a poison control center or emergency room at once. NOTE: This medicine is only for you. Do not share this medicine with others. What if I miss a dose? If you miss a dose, do not take the missed dose at all. Do not take double or extra doses. Instead, continue with your  next scheduled dose and check with your doctor. What may interact with this medicine? -antacids with aluminum and/or magnesium -folic acid -leucovorin -medicines to increase blood counts like filgrastim, pegfilgrastim, sargramostim -phenytoin -vaccines -warfarin Talk to your doctor or health care professional before taking any of these medicines: -acetaminophen -aspirin -ibuprofen -ketoprofen -naproxen This list may not describe all possible interactions. Give your health care provider a list of all the medicines, herbs, non-prescription drugs, or dietary supplements you use. Also tell them if you smoke, drink alcohol, or use illegal drugs. Some items may interact with your medicine. What should I watch for while using this medicine? Visit your doctor for checks on your progress. This drug may make you feel generally unwell. This is not uncommon, as chemotherapy can affect healthy cells as well as cancer cells. Report any side effects. Continue your course of treatment even though you feel ill unless your doctor tells you to stop. In some cases, you may be given additional medicines to help with side effects. Follow all directions for their use. Call your doctor or health care professional for advice if you get a fever, chills or sore throat, or other symptoms of a cold or flu. Do not treat yourself. This drug decreases your body's ability to fight infections. Try to avoid being around people who are sick. This medicine may increase your risk to bruise or bleed. Call your doctor or health care professional if you notice any unusual bleeding. Be careful brushing and flossing your teeth or using a toothpick because you may get an infection or bleed more easily. If  may increase your risk to bruise or bleed. Call your doctor or health care professional if you notice any unusual bleeding.  Be careful brushing and flossing your teeth or using a toothpick because you may get an infection or bleed more easily. If you have any dental work done, tell your dentist you are receiving this medicine.  Avoid taking products that contain aspirin, acetaminophen, ibuprofen,  naproxen, or ketoprofen unless instructed by your doctor. These medicines may hide a fever.  Do not become pregnant while taking this medicine or for 6 months after stopping it. Women should inform their doctor if they wish to become pregnant or think they might be pregnant. There is a potential for serious side effects to an unborn child. Talk to your health care professional or pharmacist for more information. Do not breast-feed an infant while taking this medicine or for 2 weeks after stopping it.  Men are advised not to father a child while taking this medicine or for 3 months after stopping it.  This medicine may make it more difficult to get pregnant or father a child. Talk with your doctor or health care professional if you are concerned about your fertility.  What side effects may I notice from receiving this medicine?  Side effects that you should report to your doctor or health care professional as soon as possible:  -allergic reactions like skin rash, itching or hives, swelling of the face, lips, or tongue  -low blood counts - this medicine may decrease the number of Destiny [name] blood cells, red blood cells and platelets. You may be at increased risk for infections and bleeding.  -signs of infection - fever or chills, cough, sore throat, pain or difficulty passing urine  -signs of decreased platelets or bleeding - bruising, pinpoint red spots on the skin, black, tarry stools, blood in the urine  -signs of decreased red blood cells - unusually weak or tired, fainting spells, lightheadedness  -breathing problems  -changes in vision  -chest pain  -dark urine  -diarrhea of more than 4 bowel movements in one day or any diarrhea at night; bloody or watery diarrhea  -dizziness  -mouth sores  -nausea and vomiting  -pain, tingling, numbness in the hands or feet  -redness, swelling, or sores on hands or feet  -stomach pain  -vomiting  -yellow color of skin or eyes   Side effects that usually do not require medical attention (report to your doctor or health care professional if they continue or are bothersome):  -constipation  -diarrhea  -dry or itchy skin  -hair loss  -loss of appetite  -nausea  -weak or tired  This list may not describe all possible side effects. Call your doctor for medical advice about side effects. You may report side effects to FDA at 1-800-FDA-1088.  Where should I keep my medicine?  Keep out of the reach of children.  Store at room temperature between 15 and 30 degrees C (59 and 86 degrees F). Keep container tightly closed. Throw away any unused medicine after the expiration date.  NOTE: This sheet is a summary. It may not cover all possible information. If you have questions about this medicine, talk to your doctor, pharmacist, or health care provider.  © 2018 [employer] (2015-10-18 13:11:21)

## 2018-03-13 NOTE — Telephone Encounter (Signed)
Left message for Destiny [name] that Dr. Loletha Grayer has spoken to Dr. Audie Clear who has agreed to see patient next week.  Advised her to call me for anything she may need to expedite this patient's care.

## 2018-03-16 ENCOUNTER — Ambulatory Visit: Admit: 2018-03-16 | Discharge: 2018-03-17 | Payer: MEDICARE

## 2018-03-16 DIAGNOSIS — C20 Malignant neoplasm of rectum: Principal | ICD-10-CM

## 2018-03-18 ENCOUNTER — Ambulatory Visit: Admit: 2018-03-18 | Discharge: 2018-03-19 | Payer: MEDICARE | Attending: Surgery | Primary: Surgery

## 2018-03-18 DIAGNOSIS — C20 Malignant neoplasm of rectum: Principal | ICD-10-CM

## 2018-03-20 ENCOUNTER — Other Ambulatory Visit: Payer: Self-pay

## 2018-03-20 ENCOUNTER — Encounter: Payer: Self-pay | Admitting: Radiation Oncology

## 2018-03-20 ENCOUNTER — Ambulatory Visit
Admission: RE | Admit: 2018-03-20 | Discharge: 2018-03-20 | Disposition: A | Discharge status: Home / Self Care | Payer: Medicare Other | Source: Ambulatory Visit | Attending: Radiation Oncology | Admitting: Radiation Oncology

## 2018-03-20 VITALS — BP 143/77 | HR 84 | Temp 97.1°F | Resp 20 | Wt 125.7 lb

## 2018-03-20 DIAGNOSIS — E785 Hyperlipidemia, unspecified: Secondary | ICD-10-CM | POA: Insufficient documentation

## 2018-03-20 DIAGNOSIS — E119 Type 2 diabetes mellitus without complications: Secondary | ICD-10-CM | POA: Diagnosis not present

## 2018-03-20 DIAGNOSIS — Z79899 Other long term (current) drug therapy: Secondary | ICD-10-CM | POA: Diagnosis not present

## 2018-03-20 DIAGNOSIS — C20 Malignant neoplasm of rectum: Secondary | ICD-10-CM | POA: Insufficient documentation

## 2018-03-20 DIAGNOSIS — Z8673 Personal history of transient ischemic attack (TIA), and cerebral infarction without residual deficits: Secondary | ICD-10-CM | POA: Insufficient documentation

## 2018-03-20 DIAGNOSIS — Z7984 Long term (current) use of oral hypoglycemic drugs: Secondary | ICD-10-CM | POA: Insufficient documentation

## 2018-03-20 NOTE — Consult Note (Signed)
NEW PATIENT EVALUATION  Name: Destiny [name]  MRN: [id number]  Date:   03/20/2018     DOB: 28-Sep-1924   This 84 y.o. female patient presents to the clinic for initial evaluation of stage IIa (T3 N0 M0) adenocarcinoma the rectum.  REFERRING PHYSICIAN: Derinda Late, MD  CHIEF COMPLAINT:  Chief Complaint  Patient presents with  . Cancer    Pt is here for initial consultation of rectal cancer    DIAGNOSIS: The encounter diagnosis was Malignant neoplasm of rectum ([place]).   PREVIOUS INVESTIGATIONS:  MRI scan and PET/CT scans reviewed Pathology reports reviewed Clinical notes reviewed  HPI: patient is a 84 year old female who presented with bright red blood per rectum presented to the emergency room with anemia.rectal exam showed a soft rectal mass underwent flex sigmoidoscopy on 522 showing ulcerated nonobstructing mass in the rectum. Mass was partially circumferential involving one half the lumen circumference. Mass was 5 cm in length. Biopsy was positive for moderately differentiated adenocarcinoma. CT scan showed eccentric mural thickening along the posterior wall the rectum.tumor extended to the muscularis propria with concern for desmoplastic microscopic perirectal extension. No adenopathy was appreciated.MRI confirmed mass 2.3 cm from the anal sphincter stage T2/early T3. No evidence of perirectal adenopathy was noted.PET CT scan confirmed hypermetabolic neoplasm in the rectum with no local regional adenopathy or metastatic diseasePatient has been seen in [facility] by surgery although no official consult note is seen patient and grandson relate that surgeon offered surgical resection with colostomy at this time. Patient is adamant against having surgery. She's been seen by medical oncology with proposal for either neoadjuvant chemoradiation or chemoradiation to prevent surgery. She is seen today and is doing well she states the bleeding has stopped she's having no rectal pain or  discomfort.  PLANNED TREATMENT REGIMEN: concurrent chemoradiation  PAST MEDICAL HISTORY:  has a past medical history of Allergy, Diabetes mellitus without complication ([place]), Hyperlipidemia, Shingles, and Stroke ([place]).    PAST SURGICAL HISTORY:  Past Surgical History:  Procedure Laterality Date  . APPENDECTOMY    . BACK SURGERY    . COLONOSCOPY WITH PROPOFOL N/A 03/06/2018   Procedure: COLONOSCOPY WITH PROPOFOL;  Surgeon: Virgel Manifold, MD;  Location: [facility] ENDOSCOPY;  Service: Endoscopy;  Laterality: N/A;  . FLEXIBLE SIGMOIDOSCOPY Left 03/04/2018   Procedure: Colonoscopy/FLEXIBLE SIGMOIDOSCOPY;  Surgeon: Virgel Manifold, MD;  Location: [facility] ENDOSCOPY;  Service: Endoscopy;  Laterality: Left;  . FOOT SURGERY Bilateral    [place]  . JOINT REPLACEMENT    . KNEE SURGERY Bilateral   . TONSILECTOMY, ADENOIDECTOMY, BILATERAL MYRINGOTOMY AND TUBES      FAMILY HISTORY: family history includes Stroke in her father and mother.  SOCIAL HISTORY:  reports that she has never smoked. She has never used smokeless tobacco. She reports that she does not drink alcohol or use drugs.  ALLERGIES: Penicillins; Ampicillin; Hydrocodone-homatropine; Amoxicillin; and Tramadol  MEDICATIONS:  Current Outpatient Medications  Medication Sig Dispense Refill  . amLODipine (NORVASC) 2.5 MG tablet Take 1 tablet (2.5 mg total) by mouth daily. Take once daily if blood pressure > 160 14 tablet 0  . butalbital-acetaminophen-caffeine (FIORICET, ESGIC) 50-325-40 MG tablet Take 1 tablet by mouth every 4 (four) hours as needed for headache.    . cholecalciferol (VITAMIN D) 1000 units tablet Take 1,000 Units by mouth daily.    Marland Kitchen doxepin (SINEQUAN) 10 MG capsule TAKE ONE CAPSULE BY MOUTH EVERY NIGHT AT BEDTIME    . fluticasone (FLONASE) 50 MCG/ACT nasal spray Place 2 sprays into  both nostrils daily.    . metFORMIN (GLUCOPHAGE) 500 MG tablet TAKE ONE (1) TABLET BY MOUTH EVERY DAY    . protein supplement shake  (PREMIER PROTEIN) LIQD Take 325 mLs (11 oz total) by mouth 2 (two) times daily between meals. 60 Can 0  . telmisartan (MICARDIS) 80 MG tablet Take 1 tablet by mouth daily.    . vitamin B-12 (CYANOCOBALAMIN) 1000 MCG tablet Take 1,000 mcg by mouth daily.    Marland Kitchen zolpidem (AMBIEN) 5 MG tablet Take 1 tablet (5 mg total) by mouth at bedtime as needed for sleep. 3 tablet 0  . acetaminophen (TYLENOL) 325 MG tablet Take 2 tablets (650 mg total) by mouth every 6 (six) hours as needed for mild pain (or Fever >/= 101). (Patient not taking: Reported on 03/13/2018)    . ferrous sulfate 325 (65 FE) MG tablet Take 1 tablet (325 mg total) by mouth 2 (two) times daily with a meal. (Patient not taking: Reported on 03/13/2018) 60 tablet 0   No current facility-administered medications for this encounter.     ECOG PERFORMANCE STATUS:  1 - Symptomatic but completely ambulatory  REVIEW OF SYSTEMS: except for the anemia Patient denies any weight loss, fatigue, weakness, fever, chills or night sweats. Patient denies any loss of vision, blurred vision. Patient denies any ringing  of the ears or hearing loss. No irregular heartbeat. Patient denies heart murmur or history of fainting. Patient denies any chest pain or pain radiating to her upper extremities. Patient denies any shortness of breath, difficulty breathing at night, cough or hemoptysis. Patient denies any swelling in the lower legs. Patient denies any nausea vomiting, vomiting of blood, or coffee ground material in the vomitus. Patient denies any stomach pain. Patient states has had normal bowel movements no significant constipation or diarrhea. Patient denies any dysuria, hematuria or significant nocturia. Patient denies any problems walking, swelling in the joints or loss of balance. Patient denies any skin changes, loss of hair or loss of weight. Patient denies any excessive worrying or anxiety or significant depression. Patient denies any problems with insomnia.  Patient denies excessive thirst, polyuria, polydipsia. Patient denies any swollen glands, patient denies easy bruising or easy bleeding. Patient denies any recent infections, allergies or URI. Patient "s visual fields have not changed significantly in recent time. PHYSICAL EXAM: BP (!) 143/77   Pulse 84   Temp (!) 97.1 F (36.2 C)   Resp 20   Wt 125 lb 10.6 oz (57 kg)   BMI 24.54 kg/m  Well-developed well-nourished patient in NAD. HEENT reveals PERLA, EOMI, discs not visualized.  Oral cavity is clear. No oral mucosal lesions are identified. Neck is clear without evidence of cervical or supraclavicular adenopathy. Lungs are clear to A&P. Cardiac examination is essentially unremarkable with regular rate and rhythm without murmur rub or thrill. Abdomen is benign with no organomegaly or masses noted. Motor sensory and DTR levels are equal and symmetric in the upper and lower extremities. Cranial nerves II through XII are grossly intact. Proprioception is intact. No peripheral adenopathy or edema is identified. No motor or sensory levels are noted. Crude visual fields are within normal range.  LABORATORY DATA: pathology reports reviewed    RADIOLOGY RESULTS:CT scan and MRI scan and PET/CT scan reviewed   IMPRESSION: stage IIa adenocarcinoma of the rectum and 84 year old female  PLAN: at this time patient again is adamant about refusing surgical intervention with colostomy. I have discussed the case personally with medical oncology. I believe we  can go ahead with neoadjuvant chemoradiation and hopefully avoid surgery. I would treat a modified pelvic field up to 4500 cGy boosting her rectum another 540 cGy. If I can exclude all small bowel I will bring her rectum up to another 540 cGy .risks and benefits of treatment including diarrhea fatigue possible increased lower urinary tract symptoms skin reaction alteration of blood counts all were discussed in detail with the patient and her grandson. They both  seem to comprehend my treatment plan well. I've personally set up and ordered CT simulation for next week. Patient will have follow-up with surgery and medical oncology in the next several weeks.  I would like to take this opportunity to thank you for allowing me to participate in the care of your patient.Noreene Filbert, MD

## 2018-03-24 ENCOUNTER — Other Ambulatory Visit: Payer: Self-pay

## 2018-03-24 ENCOUNTER — Inpatient Hospital Stay: Payer: Medicare Other | Attending: Hematology and Oncology | Admitting: Hematology and Oncology

## 2018-03-24 ENCOUNTER — Encounter: Payer: Self-pay | Admitting: Hematology and Oncology

## 2018-03-24 ENCOUNTER — Encounter: Payer: Self-pay | Admitting: *Deleted

## 2018-03-24 VITALS — BP 125/69 | HR 97 | Temp 97.6°F | Resp 20 | Ht 60.0 in | Wt 126.0 lb

## 2018-03-24 DIAGNOSIS — Z7189 Other specified counseling: Secondary | ICD-10-CM

## 2018-03-24 DIAGNOSIS — Z7984 Long term (current) use of oral hypoglycemic drugs: Secondary | ICD-10-CM | POA: Diagnosis not present

## 2018-03-24 DIAGNOSIS — M545 Low back pain: Secondary | ICD-10-CM | POA: Insufficient documentation

## 2018-03-24 DIAGNOSIS — C20 Malignant neoplasm of rectum: Secondary | ICD-10-CM

## 2018-03-24 DIAGNOSIS — Z8673 Personal history of transient ischemic attack (TIA), and cerebral infarction without residual deficits: Secondary | ICD-10-CM | POA: Diagnosis not present

## 2018-03-24 DIAGNOSIS — R1011 Right upper quadrant pain: Secondary | ICD-10-CM | POA: Insufficient documentation

## 2018-03-24 DIAGNOSIS — D509 Iron deficiency anemia, unspecified: Secondary | ICD-10-CM | POA: Insufficient documentation

## 2018-03-24 DIAGNOSIS — E119 Type 2 diabetes mellitus without complications: Secondary | ICD-10-CM | POA: Diagnosis not present

## 2018-03-24 DIAGNOSIS — Z79899 Other long term (current) drug therapy: Secondary | ICD-10-CM | POA: Diagnosis not present

## 2018-03-24 DIAGNOSIS — K5900 Constipation, unspecified: Secondary | ICD-10-CM

## 2018-03-24 NOTE — Progress Notes (Addendum)
[facility] day:  03/24/2018  Chief Complaint: Destiny [name] is a 84 y.o. female with clinical stage IIA (T3N0M0) rectal carcinoma who is seen for assessment after interval hospitalization and discussion regarding direction of therapy.  HPI:  The patient was last seen in the medical oncology clinic on 03/13/2018 after her recent hospitalization.  We discussed neoadjuvant Xeloda with radiation followed by surgery.  She was seen by Dr. Audie Clear, colorectal surgeon at [facility], on 03/18/2018.  She was felt to have a few options.  Recommendation was for APR with end colostomy.  This was felt to provide the patient with the best quality of life rather than neoadjuvant therapy and then surgery with coloanal anastomosis. If she chooses the latter option, she would have the side effects of chemoradiation and the incontinence issues with such a low anastomosis and her quality of life would suffer. She could also undergo chemoradiation and have a ~ 20% chance of complete response and then be followed very closely with surveillance. Out of all these options APR with end colostomy was felt to provide the best quality of life and also deal with the cancer. She wants to think more about the options and to return with her daughter the week of 04/06/2018.  She met with Dr. Baruch Gouty, radiation oncologist, on 03/20/2018.  She was noted to be adamant against colostomy.  Recommendation was for neoadjuvant chemoradiation and hopefully avoid surgery. He would treat a modified pelvic field up to 4500 cGy boosting her rectum another 540 cGy. If he can exclude all small bowel, he will bring her rectum up to another 540 cGy.  During the interim, she has done well.  She notes issues with constipation.  She has not had a bowel movement in 2 days.  She notes initially trying a stool softener then MOM (worked "too good").  Appetite is 75%.  She denies any nausea or vomiting.  Weight is  stable.   Past Medical History:  Diagnosis Date  . Allergy   . Cancer ([place]) 03/08/2018  . Diabetes mellitus without complication ([place])   . Hyperlipidemia   . Shingles   . Stroke [facility])     Past Surgical History:  Procedure Laterality Date  . APPENDECTOMY    . BACK SURGERY    . COLONOSCOPY WITH PROPOFOL N/A 03/06/2018   Procedure: COLONOSCOPY WITH PROPOFOL;  Surgeon: Virgel Manifold, MD;  Location: [facility] ENDOSCOPY;  Service: Endoscopy;  Laterality: N/A;  . FLEXIBLE SIGMOIDOSCOPY Left 03/04/2018   Procedure: Colonoscopy/FLEXIBLE SIGMOIDOSCOPY;  Surgeon: Virgel Manifold, MD;  Location: [facility] ENDOSCOPY;  Service: Endoscopy;  Laterality: Left;  . FOOT SURGERY Bilateral    [place]  . JOINT REPLACEMENT    . KNEE SURGERY Bilateral   . TONSILECTOMY, ADENOIDECTOMY, BILATERAL MYRINGOTOMY AND TUBES      Family History  Problem Relation Age of Onset  . Stroke Mother   . Stroke Father     Social History:  reports that she has never smoked. She has never used smokeless tobacco. She reports that she does not drink alcohol or use drugs.  She lives alone, although her youngest grandson has recently been staying with her.  She is very active and "keeps her own house".  Her daughter's name is [name]. She is her medical power of attorney.  Her daughter's contact number is [phone].  Her daughter is traveling 03/17/2018 - 04/01/2018 (back 04/02/2018).  She is accompanied by her grandson.  Allergies:  Allergies  Allergen Reactions  . Penicillins     Has patient had a PCN reaction causing immediate rash, facial/tongue/throat swelling, SOB or lightheadedness with hypotension: Unknown Has patient had a PCN reaction causing severe rash involving mucus membranes or skin necrosis: Unknown Has patient had a PCN reaction that required hospitalization: Unknown Has patient had a PCN reaction occurring within the last 10 years: No If all of the above answers are "NO", then may proceed with  Cephalosporin use.   . Ampicillin Rash  . Hydrocodone-Homatropine     Other reaction(s): Other (See Comments) tachycardia  . Amoxicillin Rash  . Tramadol Itching    Current Medications: Current Outpatient Medications  Medication Sig Dispense Refill  . amLODipine (NORVASC) 2.5 MG tablet Take 1 tablet (2.5 mg total) by mouth daily. Take once daily if blood pressure > 160 14 tablet 0  . cholecalciferol (VITAMIN D) 1000 units tablet Take 1,000 Units by mouth daily.    Marland Kitchen doxepin (SINEQUAN) 10 MG capsule TAKE ONE CAPSULE BY MOUTH EVERY NIGHT AT BEDTIME    . metFORMIN (GLUCOPHAGE) 500 MG tablet TAKE ONE (1) TABLET BY MOUTH EVERY DAY    . polyethylene glycol (MIRALAX / GLYCOLAX) packet Take 17 g by mouth daily as needed for mild constipation or moderate constipation.    Marland Kitchen telmisartan (MICARDIS) 80 MG tablet Take 1 tablet by mouth daily.    . vitamin B-12 (CYANOCOBALAMIN) 1000 MCG tablet Take 1,000 mcg by mouth daily.    Marland Kitchen acetaminophen (TYLENOL) 325 MG tablet Take 2 tablets (650 mg total) by mouth every 6 (six) hours as needed for mild pain (or Fever >/= 101).    . ferrous sulfate 325 (65 FE) MG tablet Take 1 tablet (325 mg total) by mouth 2 (two) times daily with a meal. 60 tablet 0   No current facility-administered medications for this visit.     Review of Systems  Constitutional: Negative for diaphoresis, fever, malaise/fatigue and weight loss (appetite 75%).       Feels fine.  HENT: Negative.  Negative for congestion, nosebleeds and sinus pain.   Eyes: Negative.  Negative for double vision, photophobia and pain.  Respiratory: Negative.  Negative for cough, hemoptysis, sputum production and shortness of breath.   Cardiovascular: Negative.  Negative for chest pain, palpitations, orthopnea, leg swelling and PND.  Gastrointestinal: Positive for constipation. Negative for abdominal pain, blood in stool (no interval hematochezia), diarrhea, heartburn, melena, nausea and vomiting.   Genitourinary: Negative.  Negative for dysuria, frequency, hematuria and urgency.  Musculoskeletal: Positive for back pain (chronic). Negative for falls, joint pain, myalgias and neck pain.  Skin: Negative.  Negative for itching.  Neurological: Negative for dizziness, tremors, sensory change, weakness and headaches.       H/o mild CVA 07/29/2017 (no residual deficits).  Endo/Heme/Allergies: Does not bruise/bleed easily.       Diabetes  Psychiatric/Behavioral: The patient is not nervous/anxious and does not have insomnia.   All other systems reviewed and are negative.  Performance status (ECOG): 1 - Symptomatic but completely ambulatory  Physical Exam: Blood pressure 125/69, pulse 97, temperature 97.6 F (36.4 C), temperature source Tympanic, resp. rate 20, height 5' (1.524 m), weight 126 lb (57.2 kg). GENERAL:  Well developed, well nourished, elderly woman sitting comfortably in the exam room in no acute distress. MENTAL STATUS:  Alert and oriented to person, place and time. HEAD:  Short styled gray hair.  Normocephalic, atraumatic, face symmetric, no Cushingoid features. EYES:  Glasses.  Blue eyes.  No  conjunctivitis or scleral icterus. NEUROLOGICAL: Unremarkable. PSYCH:  Appropriate.    No visits with results within 3 Day(s) from this visit.  Latest known visit with results is:  Hospital Outpatient Visit on 03/13/2018  Component Date Value Ref Range Status  . Glucose-Capillary 03/13/2018 103* 65 - 99 mg/dL Final    Assessment:  ARLINE KETTER is a 84 y.o. female with clinical stage IIA (T3N0M0) adenocarcinoma of the rectum s/p biopsy on 03/04/2018.  She presented with several weeks of hematochezia.  CEA was 30.3 on 03/05/2018.  Flexible sigmoidoscopyon 03/04/2018 revealed an ulcerated non-obstructing large mass in the rectum. The mass was partially circumferential involving 1/2 of the lumen circumference. Mass was 5 cm in length. Biopsy confirmed well to moderately  differentiated adenocarcinoma.  Colonoscopy on 03/06/2018 revealed a 7 mm polyp in the hepatic flexure (tubular adenoma), a 4 mm polyp in the sigmoid colon (tubular adenoma), thickened folds in the cecum (early hyperplastic polyp), and the known rectal carcinoma.  Chest, abdomen, and pelvic CTon 03/04/2018 revealed eccentric mural thickening along the posterior wall of the rectum.There was no local or definite metastatic disease. Therewasa 5 mm hypodensity adjacent to the gallbladder fossa too small to further characterize, felt to represent a small cyst. There were bilateral renal cysts.  Pelvic MRI on 03/07/2018 revealed T2/early T3N0 rectal adenocarcinoma.  Tumor was 6.5 cm from anal verge and 2.3 cm from anal sphincter.  Tumor was 3.2 x 1.6 cm.  There was extension through muscularis propria.  There was no well-defined extension beyond the muscularis propria. There was subtle soft tissue thickening at the site of tumor (desmoplastic reaction or microscopic perirectal extension).  There was no extramural vascular invasion/thrombus, invasion of anterior peritoneal reflection of involvement of adjacent organs or pelvic sidewall structures.  Mesorectal lymph nodes or extra-mesorectal lymphadenopathy was absent.  PET scan on 03/13/2018 revealed hypermetabolic rectal neoplasm (SUV 9.46), but no locoregional adenopathy or metastatic disease.  She has mild iron deficiency anemia.  Hemoglobin is 10.6.  Ferritin was 32 with an iron saturation of 12% on 03/05/2018.  She has a history of CVA in 07/2017.  Symptomatically, she feels fine.  She has had some constipatrion. Exam is stable.  Plan: 1. Discuss interval surgical consultation. 2. Discuss interval radiation oncology consultation. 3. Discuss options: 1) APR with end colostomy.  Possible chemotherapy and radiation if lymph node positive disease detected.  2) Concurrent chemotherapy and radiation with no surgery and followed with close  surveillance.  Approximately 20% chance of CR.  3)  Concurrent chemotherapy and radiation followed by surgery with low anastomosis.  Discuss issues outlined by Dr. Audie Clear including concern for fecal incontinence issues and poor quality of life.  Discuss side effects of radiation and chemotherapy in detail.  Discuss use of Xeloda.  Multiple questions were asked and answered.  At the end of a long discussion, patient was thinking more about a colostomy (previously declined) and less about chemotherapy and radiation.  She stated that she wanted to wait until her daughter's return to decide. 4. Discuss management of constipation. 5. Patient to call with decision regarding surgery or concurrent chemotherapy and radiation after 04/07/2018.  A total of (20-25) minutes of face-to-face time was spent with the patient with greater than 50% of that time in counseling and care-coordination.  I also spoke with Dr. Heywood Footman prior to her appointment to discuss in detail issues regarding surgery.    Lequita Asal, MD  03/24/2018, 4:35 PM

## 2018-03-25 ENCOUNTER — Ambulatory Visit: Payer: Medicare Other

## 2018-03-26 ENCOUNTER — Telehealth: Payer: Self-pay | Admitting: *Deleted

## 2018-03-26 NOTE — Telephone Encounter (Signed)
Patient contacted RN. Stated that she is "experiencing a lot of back back." She took 2 extra strength tylenol 4 am. She has minimal to no relief. Pt is requesting something stronger for pain. She is expected to go to the beach within the next few hours. Could we "send something else to [place]." pt unable to tolerate tramadol due to itching.

## 2018-03-26 NOTE — Telephone Encounter (Signed)
Patient contacted with the advice provided by NP. She gave verbal understanding to take Extra Strength Tylenol 500 mg and IBU 400mg  for pain.

## 2018-03-26 NOTE — Telephone Encounter (Signed)
I would have her check with PCP. I dont feel comfortable starting her on an opioid with her going out of town. Its a safety issue. She is older and has documented allergies/reactions with both Tramadol and hydrocodone.   Another option would be to have her take concurrent doses of APAP 500 mg and IBU 400 mg. Studies have shown that these two medications, when taken together, are equally as effective as narcotic intervention.

## 2018-04-06 ENCOUNTER — Other Ambulatory Visit: Payer: Self-pay

## 2018-04-06 ENCOUNTER — Ambulatory Visit
Admission: RE | Admit: 2018-04-06 | Discharge: 2018-04-06 | Disposition: A | Discharge status: Home / Self Care | Payer: Medicare Other | Source: Ambulatory Visit | Attending: Radiation Oncology | Admitting: Radiation Oncology

## 2018-04-06 ENCOUNTER — Telehealth: Payer: Self-pay | Admitting: Pain Medicine

## 2018-04-06 ENCOUNTER — Inpatient Hospital Stay (HOSPITAL_BASED_OUTPATIENT_CLINIC_OR_DEPARTMENT_OTHER): Payer: Medicare Other | Admitting: Oncology

## 2018-04-06 ENCOUNTER — Inpatient Hospital Stay: Payer: Medicare Other

## 2018-04-06 ENCOUNTER — Other Ambulatory Visit: Payer: Self-pay | Admitting: *Deleted

## 2018-04-06 ENCOUNTER — Encounter: Payer: Self-pay | Admitting: Oncology

## 2018-04-06 ENCOUNTER — Other Ambulatory Visit: Payer: Self-pay | Admitting: Oncology

## 2018-04-06 VITALS — BP 157/78 | HR 78 | Temp 97.4°F | Resp 20 | Wt 126.7 lb

## 2018-04-06 DIAGNOSIS — Z8673 Personal history of transient ischemic attack (TIA), and cerebral infarction without residual deficits: Secondary | ICD-10-CM

## 2018-04-06 DIAGNOSIS — Z79899 Other long term (current) drug therapy: Secondary | ICD-10-CM | POA: Diagnosis not present

## 2018-04-06 DIAGNOSIS — D509 Iron deficiency anemia, unspecified: Secondary | ICD-10-CM | POA: Diagnosis not present

## 2018-04-06 DIAGNOSIS — Z7984 Long term (current) use of oral hypoglycemic drugs: Secondary | ICD-10-CM | POA: Insufficient documentation

## 2018-04-06 DIAGNOSIS — Z510 Encounter for antineoplastic radiation therapy: Secondary | ICD-10-CM | POA: Diagnosis present

## 2018-04-06 DIAGNOSIS — M545 Low back pain: Secondary | ICD-10-CM

## 2018-04-06 DIAGNOSIS — E785 Hyperlipidemia, unspecified: Secondary | ICD-10-CM | POA: Insufficient documentation

## 2018-04-06 DIAGNOSIS — E119 Type 2 diabetes mellitus without complications: Secondary | ICD-10-CM

## 2018-04-06 DIAGNOSIS — C20 Malignant neoplasm of rectum: Secondary | ICD-10-CM | POA: Diagnosis not present

## 2018-04-06 LAB — CBC WITH DIFFERENTIAL/PLATELET
Basophils Absolute: 0.1 10*3/uL (ref 0–0.1)
Basophils Relative: 1 %
Eosinophils Absolute: 0.1 10*3/uL (ref 0–0.7)
Eosinophils Relative: 2 %
HCT: 38.6 % (ref 35.0–47.0)
HEMOGLOBIN: 12.8 g/dL (ref 12.0–16.0)
LYMPHS ABS: 1.9 10*3/uL (ref 1.0–3.6)
Lymphocytes Relative: 25 %
MCH: 30.8 pg (ref 26.0–34.0)
MCHC: 33.1 g/dL (ref 32.0–36.0)
MCV: 93.2 fL (ref 80.0–100.0)
MONO ABS: 0.6 10*3/uL (ref 0.2–0.9)
MONOS PCT: 8 %
Neutro Abs: 5 10*3/uL (ref 1.4–6.5)
Neutrophils Relative %: 64 %
PLATELETS: 235 10*3/uL (ref 150–440)
RBC: 4.14 MIL/uL (ref 3.80–5.20)
RDW: 15.1 % — ABNORMAL HIGH (ref 11.5–14.5)
WBC: 7.7 10*3/uL (ref 3.6–11.0)

## 2018-04-06 LAB — COMPREHENSIVE METABOLIC PANEL
ALBUMIN: 4 g/dL (ref 3.5–5.0)
ALK PHOS: 61 U/L (ref 38–126)
ALT: 8 U/L — AB (ref 14–54)
AST: 19 U/L (ref 15–41)
Anion gap: 8 (ref 5–15)
BUN: 18 mg/dL (ref 6–20)
CALCIUM: 9.5 mg/dL (ref 8.9–10.3)
CHLORIDE: 106 mmol/L (ref 101–111)
CO2: 28 mmol/L (ref 22–32)
Creatinine, Ser: 1.03 mg/dL — ABNORMAL HIGH (ref 0.44–1.00)
GFR calc Af Amer: 53 mL/min — ABNORMAL LOW (ref >60)
GFR calc non Af Amer: 46 mL/min — ABNORMAL LOW (ref >60)
GLUCOSE: 124 mg/dL — AB (ref 65–99)
Potassium: 4 mmol/L (ref 3.5–5.1)
SODIUM: 142 mmol/L (ref 135–145)
Total Bilirubin: 0.5 mg/dL (ref 0.3–1.2)
Total Protein: 6.9 g/dL (ref 6.5–8.1)

## 2018-04-06 LAB — URINALYSIS, COMPLETE (UACMP) WITH MICROSCOPIC
BILIRUBIN URINE: NEGATIVE
Bacteria, UA: NONE SEEN
GLUCOSE, UA: NEGATIVE mg/dL
HGB URINE DIPSTICK: NEGATIVE
Ketones, ur: NEGATIVE mg/dL
Leukocytes, UA: NEGATIVE
NITRITE: NEGATIVE
PROTEIN: NEGATIVE mg/dL
Specific Gravity, Urine: 1.006 (ref 1.005–1.030)
pH: 7 (ref 5.0–8.0)

## 2018-04-06 NOTE — Telephone Encounter (Signed)
Attempted to call Destiny [name] again, message left.

## 2018-04-06 NOTE — Progress Notes (Signed)
Patient here today as acute add on for right lower back pain.

## 2018-04-06 NOTE — Progress Notes (Signed)
Orders placed for [facility] visit [name], NP 04/06/2018 12:00 PM

## 2018-04-06 NOTE — Telephone Encounter (Signed)
Patient was at the beach last this weekend and started having severe pain, went to [facility] there and got some help but is still hurting very much. Please call. Wants to know if she can come in today

## 2018-04-06 NOTE — Progress Notes (Signed)
Symptom Management Consult note [facility]  [phone] [phone]  Patient Care Team: Derinda Late, MD as PCP - General (Family Medicine) Clent Jacks, RN as Registered Nurse Audie Clear, Ilene Qua, MD as Referring Physician (Surgery)   Name of the patient: Destiny [name]  [id number]  1924/09/12   Date of visit: 04/07/18  Diagnosis- Rectal Carcinoma  Chief complaint/ Reason for visit- Right lower back pain  Heme/Onc history: Patient was last seen by Dr. Mike Gip primary medical oncologist on March 24, 2018.  At that visit they discussed surgery versus chemo/radiation.  Per consultation with Dr. Audie Clear on 03/18/2018, thought was for her to have surgery with end colostomy which would provide her with the best quality of life versus chemoradiation and close surveillance.  Dr. Donella Stade, radiation oncologist recommended chemoradiation to hopefully avoid surgery.   At this time, she is scheduled to begin daily radiation on 04/13/2018.   Oncology History   Destiny [name] is a 84 y.o. female with clinical stage IIA (T3N0M0) adenocarcinoma of the rectums/p biopsy on 03/04/2018. She presented withseveral weeks of hematochezia.CEAwas 30.3 on 03/05/2018.  Flexible sigmoidoscopyon 03/04/2018 revealed an ulcerated non-obstructing large mass in the rectum. The mass was partially circumferential involving 1/2 of the lumen circumference. Mass was 5 cm in length. Biopsy confirmed well to moderately differentiated adenocarcinoma.  Colonoscopy on 03/06/2018 revealed a 7 mm polyp in the hepatic flexure (tubular adenoma), a 4 mm polyp in the sigmoid colon (tubular adenoma), thickened folds in the cecum (early hyperplastic polyp), and the known rectal carcinoma.  Chest, abdomen, and pelvic CTon 03/04/2018 revealed eccentric mural thickening along the posterior wall of the rectum.There was no local or definite metastatic disease. Therewasa 5 mm  hypodensity adjacent to the gallbladder fossa too small to further characterize, felt to represent a small cyst. There were bilateral renal cysts.  Pelvic MRI on 03/07/2018 revealed T2/early T3N0 rectal adenocarcinoma.  Tumor was 6.5 cm from anal verge and 2.3 cm from anal sphincter.  Tumor was 3.2 x 1.6 cm.  There was extension through muscularis propria.  There was no well-defined extension beyond the muscularis propria. There was subtle soft tissue thickening at the site of tumor (desmoplastic reaction or microscopic perirectal extension).  There was no extramural vascular invasion/thrombus, invasion of anterior peritoneal reflection of involvement of adjacent organs or pelvic sidewall structures.  Mesorectal lymph nodes or extra-mesorectal lymphadenopathy was absent.  PET scan on 03/13/2018 revealed hypermetabolic rectal neoplasm (SUV 9.46), but no locoregional adenopathy or metastatic disease.  She has mild iron deficiency anemia.  Hemoglobin is 10.6.  Ferritin was 32 with an iron saturation of 12% on 03/05/2018.  She has a history of CVAin 07/2017.       Malignant neoplasm of rectum [facility])    Initial Diagnosis    Malignant neoplasm of rectum (HCC)       Interval history-   Patient presents for presents evaluation of low back problems.  Symptoms have been present for 3 weeks and include pain in Right Lower Quadrant (aching and throbbing in character; 7/10 in severity). Initial inciting event: none. Symptoms are worst: morning. Alleviating factors identifiable by patient are none. Exacerbating factors identifiable by patient are none. Treatments so far initiated by patient: none Previous lower back problems: mid lower back- S/p injections from pain clinic. Previous workup: CT Chest and Chest X-ray performed at [facility] . Previous treatments: Given IV morphine while in the ED.  ECOG FS:1 - Symptomatic but  completely ambulatory  Review of systems- Review of Systems    Constitutional: Negative.  Negative for chills, fever, malaise/fatigue and weight loss.  HENT: Negative for congestion and ear pain.   Eyes: Negative.  Negative for blurred vision and double vision.  Respiratory: Negative.  Negative for cough, sputum production and shortness of breath.   Cardiovascular: Negative.  Negative for chest pain, palpitations and leg swelling.  Gastrointestinal: Negative.  Negative for abdominal pain, constipation, diarrhea, nausea and vomiting.  Genitourinary: Negative for dysuria, frequency and urgency.  Musculoskeletal: Positive for back pain. Negative for falls.  Skin: Negative.  Negative for rash.  Neurological: Negative.  Negative for weakness and headaches.  Endo/Heme/Allergies: Negative.  Does not bruise/bleed easily.  Psychiatric/Behavioral: Negative.  Negative for depression. The patient is not nervous/anxious and does not have insomnia.      Current treatment- None at this time  Allergies  Allergen Reactions  . Penicillins     Has patient had a PCN reaction causing immediate rash, facial/tongue/throat swelling, SOB or lightheadedness with hypotension: Unknown Has patient had a PCN reaction causing severe rash involving mucus membranes or skin necrosis: Unknown Has patient had a PCN reaction that required hospitalization: Unknown Has patient had a PCN reaction occurring within the last 10 years: No If all of the above answers are "NO", then may proceed with Cephalosporin use.   . Ampicillin Rash  . Hydrocodone-Homatropine     Other reaction(s): Other (See Comments) tachycardia  . Amoxicillin Rash  . Tramadol Itching     Past Medical History:  Diagnosis Date  . Allergy   . Diabetes mellitus without complication ([place])   . Hyperlipidemia   . Shingles   . Stroke [facility])      Past Surgical History:  Procedure Laterality Date  . APPENDECTOMY    . BACK SURGERY    . COLONOSCOPY WITH PROPOFOL N/A 03/06/2018   Procedure: COLONOSCOPY WITH  PROPOFOL;  Surgeon: Virgel Manifold, MD;  Location: [facility] ENDOSCOPY;  Service: Endoscopy;  Laterality: N/A;  . FLEXIBLE SIGMOIDOSCOPY Left 03/04/2018   Procedure: Colonoscopy/FLEXIBLE SIGMOIDOSCOPY;  Surgeon: Virgel Manifold, MD;  Location: [facility] ENDOSCOPY;  Service: Endoscopy;  Laterality: Left;  . FOOT SURGERY Bilateral    [place]  . JOINT REPLACEMENT    . KNEE SURGERY Bilateral   . TONSILECTOMY, ADENOIDECTOMY, BILATERAL MYRINGOTOMY AND TUBES      Social History   Socioeconomic History  . Marital status: Widowed    Spouse name: Not on file  . Number of children: Not on file  . Years of education: Not on file  . Highest education level: Not on file  Occupational History  . Occupation: retired  Scientific laboratory technician  . Financial resource strain: Not on file  . Food insecurity:    Worry: Not on file    Inability: Not on file  . Transportation needs:    Medical: Not on file    Non-medical: Not on file  Tobacco Use  . Smoking status: Never Smoker  . Smokeless tobacco: Never Used  Substance and Sexual Activity  . Alcohol use: No  . Drug use: No  . Sexual activity: Never  Lifestyle  . Physical activity:    Days per week: Not on file    Minutes per session: Not on file  . Stress: Not on file  Relationships  . Social connections:    Talks on phone: Not on file    Gets together: Not on file    Attends religious service: Not on  file    Active member of club or organization: Not on file    Attends meetings of clubs or organizations: Not on file    Relationship status: Not on file  . Intimate partner violence:    Fear of current or ex partner: Not on file    Emotionally abused: Not on file    Physically abused: Not on file    Forced sexual activity: Not on file  Other Topics Concern  . Not on file  Social History Narrative  . Not on file    Family History  Problem Relation Age of Onset  . Stroke Mother   . Stroke Father      Current Outpatient Medications:  .   acetaminophen (TYLENOL) 325 MG tablet, Take 2 tablets (650 mg total) by mouth every 6 (six) hours as needed for mild pain (or Fever >/= 101)., Disp: , Rfl:  .  amLODipine (NORVASC) 2.5 MG tablet, Take 1 tablet (2.5 mg total) by mouth daily. Take once daily if blood pressure > 160, Disp: 14 tablet, Rfl: 0 .  cholecalciferol (VITAMIN D) 1000 units tablet, Take 1,000 Units by mouth daily., Disp: , Rfl:  .  doxepin (SINEQUAN) 10 MG capsule, TAKE ONE CAPSULE BY MOUTH EVERY NIGHT AT BEDTIME, Disp: , Rfl:  .  ferrous sulfate 325 (65 FE) MG tablet, Take 1 tablet (325 mg total) by mouth 2 (two) times daily with a meal., Disp: 60 tablet, Rfl: 0 .  metFORMIN (GLUCOPHAGE) 500 MG tablet, TAKE ONE (1) TABLET BY MOUTH EVERY DAY, Disp: , Rfl:  .  polyethylene glycol (MIRALAX / GLYCOLAX) packet, Take 17 g by mouth daily as needed for mild constipation or moderate constipation., Disp: , Rfl:  .  telmisartan (MICARDIS) 80 MG tablet, Take 1 tablet by mouth daily., Disp: , Rfl:  .  vitamin B-12 (CYANOCOBALAMIN) 1000 MCG tablet, Take 1,000 mcg by mouth daily., Disp: , Rfl:   Physical exam:  Vitals:   04/06/18 1302  BP: (!) 157/78  Pulse: 78  Resp: 20  Temp: (!) 97.4 F (36.3 C)  TempSrc: Tympanic  Weight: 126 lb 11.2 oz (57.5 kg)   Physical Exam  Constitutional: She is oriented to person, place, and time. Vital signs are normal. She appears well-developed and well-nourished.  HENT:  Head: Normocephalic and atraumatic.  Eyes: Pupils are equal, round, and reactive to light.  Neck: Normal range of motion.  Cardiovascular: Normal rate, regular rhythm and normal heart sounds.  No murmur heard. Pulmonary/Chest: Effort normal and breath sounds normal. She has no wheezes.  Abdominal: Soft. Normal appearance and bowel sounds are normal. She exhibits no distension. There is no tenderness.  Musculoskeletal: Normal range of motion. She exhibits no edema.       Lumbar back: She exhibits tenderness.        Arms: Neurological: She is alert and oriented to person, place, and time.  Skin: Skin is warm and dry. No rash noted.  Psychiatric: Judgment normal.     CMP Latest Ref Rng & Units 04/06/2018  Glucose 65 - 99 mg/dL 124(H)  BUN 6 - 20 mg/dL 18  Creatinine 0.44 - 1.00 mg/dL 1.03(H)  Sodium 135 - 145 mmol/L 142  Potassium 3.5 - 5.1 mmol/L 4.0  Chloride 101 - 111 mmol/L 106  CO2 22 - 32 mmol/L 28  Calcium 8.9 - 10.3 mg/dL 9.5  Total Protein 6.5 - 8.1 g/dL 6.9  Total Bilirubin 0.3 - 1.2 mg/dL 0.5  Alkaline Phos 38 - 126  U/L 61  AST 15 - 41 U/L 19  ALT 14 - 54 U/L 8(L)   CBC Latest Ref Rng & Units 04/06/2018  WBC 3.6 - 11.0 K/uL 7.7  Hemoglobin 12.0 - 16.0 g/dL 12.8  Hematocrit 35.0 - 47.0 % 38.6  Platelets 150 - 440 K/uL 235    No images are attached to the encounter.  Nm Pet Image Initial (pi) Skull Base To Thigh  Result Date: 03/13/2018 CLINICAL DATA:  Initial treatment strategy for rectal cancer. EXAM: NUCLEAR MEDICINE PET SKULL BASE TO THIGH TECHNIQUE: 7.26 mCi F-18 FDG was injected intravenously. Full-ring PET imaging was performed from the skull base to thigh after the radiotracer. CT data was obtained and used for attenuation correction and anatomic localization. Fasting blood glucose: 103 mg/dl COMPARISON:  CT scan 03/04/2018 and MRI 03/07/2018 FINDINGS: Mediastinal blood pool activity: SUV max 1.88 NECK: No hypermetabolic lymph nodes in the neck. Incidental CT findings: none CHEST: No hypermetabolic mediastinal or hilar nodes. No suspicious pulmonary nodules on the CT scan. Incidental CT findings: none ABDOMEN/PELVIS: Hypermetabolic rectal cancer with SUV max of 9.46. No perirectal, sigmoid mesocolon or retroperitoneum lymphadenopathy. No findings for hepatic metastatic disease. The spleen, pancreas, adrenal glands and kidneys are unremarkable. Incidental CT findings: Advanced atherosclerotic calcifications involving the aorta and iliac arteries. Small periumbilical abdominal wall  hernia containing fat. The uterus and ovaries are unremarkable. SKELETON: No focal hypermetabolic activity to suggest skeletal metastasis. Incidental CT findings: none IMPRESSION: Hypermetabolic rectal neoplasm but no locoregional adenopathy or metastatic disease. Electronically Signed   By: Marijo Sanes M.D.   On: 03/13/2018 13:02    Assessment and plan- Patient is a 84 y.o. female who presents for right-sided lumbar pain rated 7 out of 10.  1.  Rectal cancer: Recently seen by Dr. Audie Clear colorectal surgeon at [facility] who recommended APR with end colostomy rather than neoadjuvant therapy.  This was thought to provide her with the best quality of life.  Was seen and evaluated by Dr. Donella Stade on 03/20/18 and continued to refuse surgical intervention with colostomy placement.  He thought neoadjuvant chemoradiation would be a better option for her.  Unfortunately, patient's daughter was not present during surgical consultation.  They have rescheduled this appointment for tomorrow.  Patient would like her daughter to have all of the information to help make her decision on whether to have surgery versus chemoradiation. RTC on Thursday to touch base with Dr. Mike Gip.   2.  Right lower back pain/flank: Patient was recently seen at [facility] in [place] [employer] for same complaint.  Had CT scan of chest and chest x-ray that did not reveal any acute abnormalities.  She was given a short course of pain medication and antiemetics and sent home.  Pain resolved completely.  Today I will get some labs and check a UA and urine culture to rule out urinary tract infection.  She remains afebrile.  Vital signs are stable.  UA negative for UTI.  Labs look good. Patient has establish care with a pain clinic.  Has scheduled appointment on 04/08/2018 for pain follow-up with Dr. Robb Matar.  Offered the patient a lidocaine patch but she declined stating "that does not help".  She has not tried any OTC analgesics since discharge  from [facility].  Asked her to please try OTC ibuprofen or Tylenol and let me know if this helps.  This is most likely musculoskeletal in nature given recent imaging was negative.  Recommend heat pack several times daily and OTC  analgesics.  Patient and daughter in agreement with plan.  Visit Diagnosis 1. Acute low back pain, unspecified back pain laterality, with sciatica presence unspecified   2. Malignant neoplasm of rectum [facility])     Patient expressed understanding and was in agreement with this plan. She also understands that She can call clinic at any time with any questions, concerns, or complaints.   Greater than 50% was spent in counseling and coordination of care with this patient including but not limited to discussion of the relevant topics above (See A&P) including, but not limited to diagnosis and management of acute and chronic medical conditions.    [name], AGNP-C [facility]- [phone] Pager- [phone] 04/07/2018 10:45 AM

## 2018-04-06 NOTE — Telephone Encounter (Signed)
Called patient's home number, someone at the home instructed me to call Destiny [name]'s number, [phone]. Message left.

## 2018-04-07 ENCOUNTER — Encounter: Admit: 2018-04-07 | Discharge: 2018-04-07 | Payer: MEDICARE | Attending: Surgery | Primary: Surgery

## 2018-04-07 ENCOUNTER — Ambulatory Visit: Admit: 2018-04-07 | Discharge: 2018-04-08 | Payer: MEDICARE | Attending: Surgery | Primary: Surgery

## 2018-04-07 ENCOUNTER — Telehealth: Payer: Self-pay | Admitting: Pharmacist

## 2018-04-07 ENCOUNTER — Telehealth: Payer: Self-pay | Admitting: *Deleted

## 2018-04-07 ENCOUNTER — Telehealth: Payer: Self-pay | Admitting: Pharmacy Technician

## 2018-04-07 DIAGNOSIS — C20 Malignant neoplasm of rectum: Secondary | ICD-10-CM

## 2018-04-07 MED ORDER — CAPECITABINE 500 MG PO TABS: ORAL_TABLET | ORAL | 0 refills | Status: DC

## 2018-04-07 MED ORDER — CAPECITABINE 150 MG PO TABS: 450.0000 mg | ORAL_TABLET | Freq: Two times a day (BID) | ORAL | 0 refills | Status: DC

## 2018-04-07 MED ORDER — CAPECITABINE 500 MG PO TABS: 500.0000 mg | ORAL_TABLET | Freq: Two times a day (BID) | ORAL | 0 refills | Status: DC

## 2018-04-07 MED ORDER — CAPECITABINE 150 MG PO TABS: ORAL_TABLET | ORAL | 0 refills | Status: DC

## 2018-04-07 NOTE — Telephone Encounter (Signed)
Oral Oncology Pharmacist Encounter  Received new prescription for Xeloda (capecitabine) for the treatment of stage IIA rectal carcinoma in conjunction with RT.  CMP from 04/06/18 assessed, no relevant lab abnormalities. Prescription dose and frequency assessed. Dose has been reduced due to patient's CrCl.   Current medication list in Epic reviewed, no relevant DDIs with Xeloda identified.  Prescription has been e-scribed to the [facility] for benefits analysis and approval.  Oral Oncology Clinic will continue to follow for insurance authorization, copayment issues, initial counseling and start date.  Darl Pikes, PharmD, BCPS Hematology/Oncology Clinical Pharmacist [facility] [phone]  04/07/2018 1:48 PM

## 2018-04-07 NOTE — Telephone Encounter (Signed)
Patient has called 3 times this morning stating a prescription was to be sent to [place] for her and nothing was sent. Please advise

## 2018-04-07 NOTE — Telephone Encounter (Signed)
Oral Oncology Patient Advocate Encounter  Was successful in securing patient an $24 grant from Patient [facility]) to provide copayment coverage for her Xeloda.  This will keep the out of pocket expense at $0.    The billing information is as follows and has been shared with [place].   Member ID: [id number]  Group ID: [id number] RxBin: [id number] Dates of Eligibility: 01/07/2018 through 04/07/2019.   [place] [facility] [place] Phone [phone] Fax [phone] 04/07/2018 3:25 PM

## 2018-04-07 NOTE — Telephone Encounter (Signed)
Spoke with [employer]. They have already spoken with Juliann Pulse and have an appointment tomorrow.

## 2018-04-07 NOTE — Progress Notes (Signed)
START ON PATHWAY REGIMEN - Colorectal     Administer Monday through Friday:     Capecitabine   **Always confirm dose/schedule in your pharmacy ordering system**  Patient Characteristics: Rectal Neoadjuvant/Adjuvant, T3 - T4, N0 or Any T, N+, Neoadjuvant Therapy Current evidence of distant metastases<= No AJCC T Category: T3 AJCC N Category: N0 AJCC M Category: M0 AJCC 8 Stage Grouping: IIA Intent of Therapy: Curative Intent, Discussed with Patient

## 2018-04-08 ENCOUNTER — Ambulatory Visit: Payer: Medicare Other | Attending: Pain Medicine | Admitting: Pain Medicine

## 2018-04-08 ENCOUNTER — Ambulatory Visit
Admission: RE | Admit: 2018-04-08 | Discharge: 2018-04-08 | Disposition: A | Discharge status: Home / Self Care | Payer: Medicare Other | Source: Ambulatory Visit | Attending: Pain Medicine | Admitting: Pain Medicine

## 2018-04-08 ENCOUNTER — Encounter: Payer: Self-pay | Admitting: Pain Medicine

## 2018-04-08 ENCOUNTER — Other Ambulatory Visit: Payer: Self-pay

## 2018-04-08 VITALS — BP 136/65 | HR 84 | Temp 97.8°F | Ht 60.0 in | Wt 126.0 lb

## 2018-04-08 DIAGNOSIS — E785 Hyperlipidemia, unspecified: Secondary | ICD-10-CM | POA: Diagnosis not present

## 2018-04-08 DIAGNOSIS — I700 Atherosclerosis of aorta: Secondary | ICD-10-CM | POA: Insufficient documentation

## 2018-04-08 DIAGNOSIS — M47817 Spondylosis without myelopathy or radiculopathy, lumbosacral region: Secondary | ICD-10-CM | POA: Diagnosis not present

## 2018-04-08 DIAGNOSIS — Z510 Encounter for antineoplastic radiation therapy: Secondary | ICD-10-CM | POA: Diagnosis not present

## 2018-04-08 DIAGNOSIS — M5414 Radiculopathy, thoracic region: Secondary | ICD-10-CM | POA: Diagnosis not present

## 2018-04-08 DIAGNOSIS — M546 Pain in thoracic spine: Secondary | ICD-10-CM | POA: Insufficient documentation

## 2018-04-08 DIAGNOSIS — G894 Chronic pain syndrome: Secondary | ICD-10-CM | POA: Insufficient documentation

## 2018-04-08 DIAGNOSIS — C20 Malignant neoplasm of rectum: Secondary | ICD-10-CM

## 2018-04-08 DIAGNOSIS — M545 Low back pain, unspecified: Secondary | ICD-10-CM

## 2018-04-08 DIAGNOSIS — Z7902 Long term (current) use of antithrombotics/antiplatelets: Secondary | ICD-10-CM | POA: Insufficient documentation

## 2018-04-08 DIAGNOSIS — M533 Sacrococcygeal disorders, not elsewhere classified: Secondary | ICD-10-CM | POA: Insufficient documentation

## 2018-04-08 DIAGNOSIS — K219 Gastro-esophageal reflux disease without esophagitis: Secondary | ICD-10-CM | POA: Diagnosis not present

## 2018-04-08 DIAGNOSIS — M419 Scoliosis, unspecified: Secondary | ICD-10-CM | POA: Diagnosis not present

## 2018-04-08 DIAGNOSIS — M47816 Spondylosis without myelopathy or radiculopathy, lumbar region: Secondary | ICD-10-CM | POA: Diagnosis not present

## 2018-04-08 DIAGNOSIS — Z7984 Long term (current) use of oral hypoglycemic drugs: Secondary | ICD-10-CM | POA: Insufficient documentation

## 2018-04-08 DIAGNOSIS — M549 Dorsalgia, unspecified: Secondary | ICD-10-CM | POA: Diagnosis not present

## 2018-04-08 DIAGNOSIS — I10 Essential (primary) hypertension: Secondary | ICD-10-CM | POA: Diagnosis not present

## 2018-04-08 DIAGNOSIS — G8929 Other chronic pain: Secondary | ICD-10-CM

## 2018-04-08 DIAGNOSIS — Z885 Allergy status to narcotic agent status: Secondary | ICD-10-CM | POA: Insufficient documentation

## 2018-04-08 DIAGNOSIS — Z888 Allergy status to other drugs, medicaments and biological substances status: Secondary | ICD-10-CM | POA: Insufficient documentation

## 2018-04-08 DIAGNOSIS — M899 Disorder of bone, unspecified: Secondary | ICD-10-CM

## 2018-04-08 DIAGNOSIS — Z7901 Long term (current) use of anticoagulants: Secondary | ICD-10-CM

## 2018-04-08 DIAGNOSIS — K5730 Diverticulosis of large intestine without perforation or abscess without bleeding: Secondary | ICD-10-CM | POA: Insufficient documentation

## 2018-04-08 DIAGNOSIS — Z880 Allergy status to penicillin: Secondary | ICD-10-CM | POA: Insufficient documentation

## 2018-04-08 DIAGNOSIS — D123 Benign neoplasm of transverse colon: Secondary | ICD-10-CM | POA: Diagnosis not present

## 2018-04-08 DIAGNOSIS — Z789 Other specified health status: Secondary | ICD-10-CM | POA: Insufficient documentation

## 2018-04-08 DIAGNOSIS — M5388 Other specified dorsopathies, sacral and sacrococcygeal region: Secondary | ICD-10-CM | POA: Diagnosis not present

## 2018-04-08 DIAGNOSIS — Z79899 Other long term (current) drug therapy: Secondary | ICD-10-CM | POA: Insufficient documentation

## 2018-04-08 DIAGNOSIS — E119 Type 2 diabetes mellitus without complications: Secondary | ICD-10-CM | POA: Diagnosis not present

## 2018-04-08 DIAGNOSIS — Z8673 Personal history of transient ischemic attack (TIA), and cerebral infarction without residual deficits: Secondary | ICD-10-CM | POA: Diagnosis not present

## 2018-04-08 LAB — URINE CULTURE

## 2018-04-08 NOTE — Progress Notes (Signed)
[name]'s Name: Destiny [name]  MRN: [id number]  Referring Provider: Derinda Late, MD  DOB: Apr 05, 1924  PCP: Derinda Late, MD  DOS: 04/08/2018  Note by: Gaspar Cola, MD  Service setting: Ambulatory outpatient  Specialty: Interventional Pain Management  Location: [facility] (AMB) Pain Management Facility    [name] type: Established   Primary Reason(s) for Visit: Evaluation of chronic illnesses with exacerbation, or progression (Level of risk: moderate) CC: Back Pain (mid)  HPI  Destiny [name] is a 84 y.o. year old, female [name], who comes today for a follow-up evaluation. She has Chronic myofascial pain; Esophageal reflux; Benign essential hypertension; Migraine; Other and unspecified hyperlipidemia; Pseudophakia, both eyes; Salzmann's nodular dystrophy; Type II or unspecified type diabetes mellitus without mention of complication, not stated as uncontrolled; CVA (cerebral vascular accident) ([place]); Diabetes mellitus type 2, uncomplicated ([place]); Chronic low back pain (Primary Area of Pain) (Bilateral) (L>R); Chronic sacroiliac joint pain (Bilateral) (L>R); Chronic hip pain (Bilateral) (L>R); Chronic pain syndrome; Osteoarthritis of sacroiliac joints (Bilateral); Osteoarthritis of hip (Bilateral); Osteoarthritis of lumbar spine; Lumbar spondylosis; Long term current use of anticoagulant (Plavix); DDD (degenerative disc disease), lumbar; Lumbar facet hypertrophy; Lumbar facet syndrome (Bilateral); Trochanteric bursitis of hip (Right); Trigger point with back pain (buttocks area) (Right); Groin pain (Right); Spondylosis without myelopathy or radiculopathy, lumbosacral region; Lower GI bleed; GI bleed; Internal hemorrhoids; Nodule of rectum; Intestinal neoplasm; Hematochezia; Benign neoplasm of transverse colon; Polyp of sigmoid colon; Intestinal lump; Malignant neoplasm of rectum ([place]); Diverticulosis of large intestine without diverticulitis; Goals of care, counseling/discussion; Other specified  dorsopathies, sacral and sacrococcygeal region; Pharmacologic therapy; Disorder of skeletal system; Problems influencing health status; Thoracic radiculitis (Right); Acute low back pain without sciatica; and Acute upper back pain (Right) on their problem list. Destiny [name] was last seen on 04/06/2018. Her primarily concern today is Destiny Back Pain (mid)  Pain Assessment: Location: Mid, Lower Back Radiating: Denies Onset: More than a month ago Duration: Chronic pain Quality: Nagging Severity: 5 /10 (subjective, self-reported pain score)  Note: Reported level is compatible with observation.                         When using our objective Pain Scale, levels between 6 and 10/10 are said to belong in an emergency room, as it progressively worsens from a 6/10, described as severely limiting, requiring emergency care not usually available at an outpatient pain management facility. At a 6/10 level, communication becomes difficult and requires great effort. Assistance to reach Destiny emergency department may be required. Facial flushing and profuse sweating along with potentially dangerous increases in heart rate and blood pressure will be evident. Timing: Constant Modifying factors: Denies BP: 136/65  HR: 84   Since Destiny [name]'s last visit, she apparently has been diagnosed with rectal cancer. Destiny [name] comes in today clinics today complaining of pain in an area previously untreated. She indicates Destiny pain to be in Destiny right (can flank area. She does not remember any type of trauma triggering this pain. It apparently started after she stayed with a family member, while her usual caretaker was out of medication. Examination of Destiny area does not reveal any type of bruising. She indicates having been to Destiny emergency room where she had a CT scan of her abdomen and was found to have some evidence of a possible calcific pancreatitis. However, Destiny pain pattern that she has does not go along with that of a pancreatitis  or any other visceral type  of etiology. No x-rays of Destiny thoracic or Destiny lumbar spine are available. At this point my primary concern is that of a possible vertebral body compression fracture as Destiny [name] is 84 years old and likely to develop this type of pathology with very little trauma associated with it. In fact, during today's appointment I observed Destiny [name] to be coughing, which could be responsible for a fracture. To evaluate this, today we will be ordering x-rays of Destiny thoracic and lumbar spine. Because of Destiny dermatomal distribution of this pain, I will be bringing her back for a right-sided thoracic epidural steroid injection under fluoroscopic guidance, no sedation. Destiny [name] indicates that because of her rectal bleeding and bruising, she decided not to go back on her Plavix. Destiny [name] was instructed to talk to her primary care physician about this to make sure that she is doing Destiny correct thing. On Destiny other hand, since she has not been on Destiny Plavix for quite some time, we will be able to bring her back tomorrow for that epidural.  Further details on both, my assessment(s), as well as Destiny proposed treatment plan, please see below.  Laboratory Chemistry  Inflammation Markers (CRP: Acute Phase) (ESR: Chronic Phase) No results found.  Rheumatology Markers No results found.  Renal Function Markers Lab Results  Component Value Date   BUN 18 04/06/2018   CREATININE 1.03 (H) 04/06/2018   GFRAA 53 (L) 04/06/2018   GFRNONAA 46 (L) 04/06/2018                             Hepatic Function Markers Lab Results  Component Value Date   AST 19 04/06/2018   ALT 8 (L) 04/06/2018   ALBUMIN 4.0 04/06/2018   ALKPHOS 61 04/06/2018   LIPASE 36 03/02/2018                        Electrolytes Lab Results  Component Value Date   NA 142 04/06/2018   K 4.0 04/06/2018   CL 106 04/06/2018   CALCIUM 9.5 04/06/2018                        Neuropathy Markers Lab Results  Component Value  Date   HGBA1C 6.7 (H) 07/30/2017                        Bone Pathology Markers No results found.  Coagulation Parameters Lab Results  Component Value Date   INR 0.94 07/30/2017   LABPROT 12.5 07/30/2017   APTT 34 07/30/2017   PLT 235 04/06/2018                        Cardiovascular Markers Lab Results  Component Value Date   TROPONINI <0.03 02/02/2018   HGB 12.8 04/06/2018   HCT 38.6 04/06/2018                         Note: Lab results reviewed.  Recent Diagnostic Imaging Review  Lumbosacral Imaging: Lumbar MR wo contrast:  Results for orders placed during Destiny hospital encounter of 01/14/18  MR LUMBAR SPINE WO CONTRAST   Narrative CLINICAL DATA:  Lumbar disc degeneration with chronic low back pain. History of back surgery  EXAM: MRI LUMBAR SPINE WITHOUT CONTRAST  TECHNIQUE: Multiplanar, multisequence MR imaging of Destiny lumbar spine was  performed. No intravenous contrast was administered.  COMPARISON:  Lumbar MRI 02/25/2009  FINDINGS: Segmentation:  Normal.  Lowest disc space L5-S1  Alignment:  Normal  Vertebrae:  Normal bone marrow.  Negative for fracture or mass.  Conus medullaris and cauda equina: Conus extends to Destiny L2-3 level. Conus and cauda equina appear normal.  Paraspinal and other soft tissues: Bilateral renal cysts. No retroperitoneal mass. Paraspinous soft tissues normal.  Disc levels:  T12-L1: Mild disc degeneration  L1-2: Mild disc degeneration and mild facet degeneration without stenosis  L2-3: Moderate disc degeneration with disc space narrowing and spondylosis. Diffuse endplate spurring. Bilateral facet degeneration and mild spinal stenosis. Progressive stenosis since 2010  L3-4: Moderate to advanced disc degeneration and spondylosis with diffuse endplate spurring. Bilateral facet hypertrophy. Mild spinal stenosis with mild progression  L4-5: Moderate to advanced disc degeneration and spondylosis. Diffuse endplate spurring.  Bilateral facet hypertrophy. Mild spinal stenosis with progression. Subarticular stenosis left greater than right  L5-S1: Moderate disc degeneration with disc space narrowing and spurring. Bilateral facet degeneration. Negative for stenosis  IMPRESSION: Progressive disc degeneration and spondylosis since 2010. Mild spinal stenosis has progressed at L2-3, L3-4, and L4-5. No focal disc protrusion identified.   Electronically Signed   By: Franchot Gallo M.D.   On: 01/14/2018 12:46    Lumbar DG Bending views:  Results for orders placed during Destiny hospital encounter of 09/08/17  DG Lumbar Spine Complete W/Bend   Narrative CLINICAL DATA:  In mid low back pain for 3 weeks, no acute injury  EXAM: LUMBAR SPINE - COMPLETE WITH BENDING VIEWS  COMPARISON:  MR lumbar spine of 02/25/2009  FINDINGS: Destiny lumbar vertebrae remain in normal alignment. There is diffuse degenerative disc disease throughout lumbar spine with loss of disc space and sclerosis with spurring. No compression deformity is seen. Destiny SI joints appear corticated. A moderate amount of feces is noted diffusely throughout Destiny colon.  IMPRESSION: 1. Normal alignment with diffuse degenerative disc disease. 2. Moderate amount of feces diffusely throughout Destiny colon.   Electronically Signed   By: Ivar Drape M.D.   On: 09/09/2017 08:30    Sacroiliac Joint Imaging: Sacroiliac Joint DG:  Results for orders placed during Destiny hospital encounter of 09/08/17  DG Si Joints   Narrative CLINICAL DATA:  Intermittent pain in Destiny low back for 3 weeks, no acute injury  EXAM: BILATERAL SACROILIAC JOINTS - 3+ VIEW  COMPARISON:  None.  FINDINGS: Destiny SI joints appear well corticated with mild degenerative change present. No acute abnormality is seen. Destiny pelvic rami are intact. Only mild degenerative changes noted within Destiny hip joint spaces.  IMPRESSION: 1. Mild degenerative change of Destiny SI joints. No evidence  of sacroiliitis. 2. Degenerative change in Destiny lower lumbar spine with lesser degenerative change in Destiny hips .   Electronically Signed   By: Ivar Drape M.D.   On: 09/09/2017 08:32    Complexity Note: Imaging results reviewed. Results shared with Destiny [name], using Layman's terms.                         Meds   Current Outpatient Medications:  .  acetaminophen (TYLENOL) 325 MG tablet, Take 2 tablets (650 mg total) by mouth every 6 (six) hours as needed for mild pain (or Fever >/= 101)., Disp: , Rfl:  .  amLODipine (NORVASC) 2.5 MG tablet, Take 1 tablet (2.5 mg total) by mouth daily. Take once daily if blood pressure > 160,  Disp: 14 tablet, Rfl: 0 .  cholecalciferol (VITAMIN D) 1000 units tablet, Take 1,000 Units by mouth daily., Disp: , Rfl:  .  doxepin (SINEQUAN) 10 MG capsule, TAKE ONE CAPSULE BY MOUTH EVERY NIGHT AT BEDTIME, Disp: , Rfl:  .  ferrous sulfate 325 (65 FE) MG tablet, Take 1 tablet (325 mg total) by mouth 2 (two) times daily with a meal., Disp: 60 tablet, Rfl: 0 .  metFORMIN (GLUCOPHAGE) 500 MG tablet, TAKE ONE (1) TABLET BY MOUTH EVERY DAY, Disp: , Rfl:  .  polyethylene glycol (MIRALAX / GLYCOLAX) packet, Take 17 g by mouth daily as needed for mild constipation or moderate constipation., Disp: , Rfl:  .  telmisartan (MICARDIS) 80 MG tablet, Take 1 tablet by mouth daily., Disp: , Rfl:  .  vitamin B-12 (CYANOCOBALAMIN) 1000 MCG tablet, Take 1,000 mcg by mouth daily., Disp: , Rfl:   ROS  Constitutional: Denies any fever or chills Gastrointestinal: No reported hemesis, hematochezia, vomiting, or acute GI distress Musculoskeletal: Denies any acute onset joint swelling, redness, loss of ROM, or weakness Neurological: No reported episodes of acute onset apraxia, aphasia, dysarthria, agnosia, amnesia, paralysis, loss of coordination, or loss of consciousness  Allergies  Destiny [name] is allergic to penicillins; ampicillin; hydrocodone-homatropine; amoxicillin; and  tramadol.  PFSH  Drug: Destiny [name]  reports that she does not use drugs. Alcohol:  reports that she does not drink alcohol. Tobacco:  reports that she has never smoked. She has never used smokeless tobacco. Medical:  has a past medical history of Allergy, Cancer ([place]) (03/08/2018), Diabetes mellitus without complication ([place]), Hyperlipidemia, Shingles, and Stroke ([place]). Surgical: Destiny [name]  has a past surgical history that includes Knee surgery (Bilateral); Back surgery; Appendectomy; Tonsilectomy, adenoidectomy, bilateral myringotomy and tubes; Foot surgery (Bilateral); Flexible sigmoidoscopy (Left, 03/04/2018); Joint replacement; and Colonoscopy with propofol (N/A, 03/06/2018). Family: family history includes Stroke in her father and mother.  Constitutional Exam  General appearance: Well nourished, well developed, and well hydrated. In no apparent acute distress Vitals:   04/08/18 1136  BP: 136/65  Pulse: 84  Temp: 97.8 F (36.6 C)  SpO2: 99%  Weight: 126 lb (57.2 kg)  Height: 5' (1.524 m)   BMI Assessment: Estimated body mass index is 24.61 kg/m as calculated from Destiny following:   Height as of this encounter: 5' (1.524 m).   Weight as of this encounter: 126 lb (57.2 kg).  BMI interpretation table: BMI level Category Range association with higher incidence of chronic pain  <18 kg/m2 Underweight   18.5-24.9 kg/m2 Ideal body weight   25-29.9 kg/m2 Overweight Increased incidence by 20%  30-34.9 kg/m2 Obese (Class I) Increased incidence by 68%  35-39.9 kg/m2 Severe obesity (Class II) Increased incidence by 136%  >40 kg/m2 Extreme obesity (Class III) Increased incidence by 254%   [name]'s current BMI Ideal Body weight  Body mass index is 24.61 kg/m. Ideal body weight: 45.5 kg (100 lb 4.9 oz) Adjusted ideal body weight: 50.2 kg (110 lb 9.4 oz)   BMI Readings from Last 4 Encounters:  04/08/18 24.61 kg/m  04/06/18 24.74 kg/m  03/24/18 24.61 kg/m  03/20/18 24.54 kg/m   Wt  Readings from Last 4 Encounters:  04/08/18 126 lb (57.2 kg)  04/06/18 126 lb 11.2 oz (57.5 kg)  03/24/18 126 lb (57.2 kg)  03/20/18 125 lb 10.6 oz (57 kg)  Psych/Mental status: Alert, oriented x 3 (person, place, & time)       Eyes: PERLA Respiratory: No evidence of acute respiratory distress  Cervical Spine Area Exam  Skin & Axial Inspection: No masses, redness, edema, swelling, or associated skin lesions Alignment: Symmetrical Functional ROM: Unrestricted ROM      Stability: No instability detected Muscle Tone/Strength: Functionally intact. No obvious neuro-muscular anomalies detected. Sensory (Neurological): Unimpaired Palpation: No palpable anomalies              Upper Extremity (UE) Exam    Side: Right upper extremity  Side: Left upper extremity  Skin & Extremity Inspection: Skin color, temperature, and hair growth are WNL. No peripheral edema or cyanosis. No masses, redness, swelling, asymmetry, or associated skin lesions. No contractures.  Skin & Extremity Inspection: Skin color, temperature, and hair growth are WNL. No peripheral edema or cyanosis. No masses, redness, swelling, asymmetry, or associated skin lesions. No contractures.  Functional ROM: Unrestricted ROM          Functional ROM: Unrestricted ROM          Muscle Tone/Strength: Functionally intact. No obvious neuro-muscular anomalies detected.  Muscle Tone/Strength: Functionally intact. No obvious neuro-muscular anomalies detected.  Sensory (Neurological): Unimpaired          Sensory (Neurological): Unimpaired          Palpation: No palpable anomalies              Palpation: No palpable anomalies              Provocative Test(s):  Phalen's test: deferred Tinel's test: deferred Apley's scratch test (touch opposite shoulder):  Action 1 (Across chest): deferred Action 2 (Overhead): deferred Action 3 (LB reach): deferred   Provocative Test(s):  Phalen's test: deferred Tinel's test: deferred Apley's scratch test (touch  opposite shoulder):  Action 1 (Across chest): deferred Action 2 (Overhead): deferred Action 3 (LB reach): deferred    Thoracic Spine Area Exam  Skin & Axial Inspection: No masses, redness, or swelling Alignment: Symmetrical Functional ROM: Decreased ROM Stability: No instability detected Muscle Tone/Strength: Increased muscle tone over affected area Sensory (Neurological): Movement associated pain Muscle strength & Tone: Complains of area being tender to palpation  Lumbar Spine Area Exam  Skin & Axial Inspection: No masses, redness, or swelling Alignment: Symmetrical Functional ROM: Unrestricted ROM       Stability: No instability detected Muscle Tone/Strength: Functionally intact. No obvious neuro-muscular anomalies detected. Sensory (Neurological): Unimpaired Palpation: No palpable anomalies       Provocative Tests: Lumbar Hyperextension/rotation test: deferred today       Lumbar quadrant test (Kemp's test): deferred today       Lumbar Lateral bending test: deferred today       Patrick's Maneuver: deferred today                   FABER test: deferred today       Thigh-thrust test: deferred today       S-I compression test: deferred today       S-I distraction test: deferred today        Gait & Posture Assessment  Ambulation: Unassisted Gait: Relatively normal for age and body habitus Posture: WNL   Lower Extremity Exam    Side: Right lower extremity  Side: Left lower extremity  Stability: No instability observed          Stability: No instability observed          Skin & Extremity Inspection: Skin color, temperature, and hair growth are WNL. No peripheral edema or cyanosis. No masses, redness, swelling, asymmetry, or associated skin lesions. No contractures.  Skin & Extremity Inspection: Skin color, temperature, and hair growth are WNL. No peripheral edema or cyanosis. No masses, redness, swelling, asymmetry, or associated skin lesions. No contractures.  Functional ROM:  Unrestricted ROM                  Functional ROM: Unrestricted ROM                  Muscle Tone/Strength: Functionally intact. No obvious neuro-muscular anomalies detected.  Muscle Tone/Strength: Functionally intact. No obvious neuro-muscular anomalies detected.  Sensory (Neurological): Unimpaired  Sensory (Neurological): Unimpaired  Palpation: No palpable anomalies  Palpation: No palpable anomalies   Assessment  Primary Diagnosis & Pertinent Problem List: Destiny primary encounter diagnosis was Acute right-sided low back pain without sciatica. Diagnoses of Acute upper back pain (Right), Thoracic radiculitis (Right), Chronic low back pain (Primary Area of Pain) (Bilateral) (L>R), Lumbar facet syndrome (Bilateral), Lumbar facet hypertrophy, Spondylosis without myelopathy or radiculopathy, lumbosacral region, Chronic sacroiliac joint pain (Bilateral) (L>R), Other specified dorsopathies, sacral and sacrococcygeal region, Chronic pain syndrome, Pharmacologic therapy, Disorder of skeletal system, Problems influencing health status, and Long term current use of anticoagulant (Plavix) were also pertinent to this visit.  Status Diagnosis  Worsening Worsening Worsening 1. Acute right-sided low back pain without sciatica   2. Acute upper back pain (Right)   3. Thoracic radiculitis (Right)   4. Chronic low back pain (Primary Area of Pain) (Bilateral) (L>R)   5. Lumbar facet syndrome (Bilateral)   6. Lumbar facet hypertrophy   7. Spondylosis without myelopathy or radiculopathy, lumbosacral region   8. Chronic sacroiliac joint pain (Bilateral) (L>R)   9. Other specified dorsopathies, sacral and sacrococcygeal region   10. Chronic pain syndrome   11. Pharmacologic therapy   12. Disorder of skeletal system   13. Problems influencing health status   14. Long term current use of anticoagulant (Plavix)     Problems updated and reviewed during this visit: Problem  Other Specified Dorsopathies, Sacral and  Sacrococcygeal Region  Thoracic radiculitis (Right)  Acute Low Back Pain Without Sciatica  Acute upper back pain (Right)  Pharmacologic Therapy  Disorder of Skeletal System  Problems Influencing Health Status  Goals of Care, Counseling/Discussion  Benign Neoplasm of Transverse Colon  Polyp of Sigmoid Colon  Intestinal Lump  Malignant Neoplasm of Rectum ([facility])  Diverticulosis of Large Intestine Without Diverticulitis  Internal Hemorrhoids  Nodule of Rectum  Intestinal Neoplasm  Hematochezia  GI Bleed  Lower GI Bleed   Plan of Care  Pharmacotherapy (Medications Ordered): No orders of Destiny defined types were placed in this encounter.  Medications administered today: Abeer T. Viscuso had no medications administered during this visit.   Procedure Orders     Thoracic Epidural Injection  Lab Orders     Sedimentation rate     C-reactive protein     Vitamin B12     Magnesium     25-Hydroxyvitamin D Lcms D2+D3  Imaging Orders     DG Lumbar Spine Complete W/Bend     DG Thoracic Spine 2 View Referral Orders  No referral(s) requested today    Interventional management options: Planned, scheduled, and/or pending:   NOTE: PLAVIX ANTICOAGULATION (Stop 7-10 days before procedures) Diagnostic right TESI #1    Considering:   Diagnostic/therapeutic right-sided L5-S1 interlaminar LESI #2 (100/80/50) Diagnostic bilateral lumbar facet block Diagnostic right-sided lumbar facet block #2 (100/100/100/>90) Diagnostic left sided lumbar facet block #1  Possible bilateral lumbar facet RFA#1  Diagnostic bilateral sacroiliac joint  block Diagnostic right-sided sacroiliac joint block #3 (1st w/ TPI: 90/90/80/>75)  (2nd w/ TPI: 0/100/70)  Possible bilateral sacroiliac joint RFA Diagnostic bilateral intra-articular hip joint injection Diagnostic bilateral femoral nerve block + obturator nerve block Possible bilateral femoral nerve + obturator nerve RFA Diagnostic right-sided trochanteric  bursa injection Palliative right-sided quadratus lumbar/erector spinae muscle trigger point injection/myoneural block (PSIS level) #3 (90/90/80/>75)   Palliative PRN treatment(s):   Diagnosticrightlumbar facet block#2 under fluoroscopic guidance and IV sedation (PRN) Palliativeright-sided sacroiliac joint blockunder fluoroscopic guidance, no sedation.   Provider-requested follow-up: Return in 1 day (on 04/09/2018) for Procedure (no sedation): (R) TESI #1.  Future Appointments  Date Time Provider [place]  04/09/2018  7:45 AM Milinda Pointer, MD [facility] None  04/09/2018  1:30 PM Lequita Asal, MD [facility] None  04/13/2018  2:15 PM [facility] None  04/14/2018  2:00 PM [facility] 1 [facility] None  04/15/2018  2:00 PM [facility] 1 [facility] None  04/17/2018  2:00 PM [facility] 1 [facility] None  04/20/2018  2:00 PM [facility] 1 [facility] None  04/21/2018  2:00 PM [facility] 1 [facility] None  04/22/2018  2:00 PM [facility] 1 [facility] None  04/23/2018  2:00 PM [facility] 1 [facility] None  04/24/2018  2:00 PM [facility] 1 [facility] None  04/27/2018  2:00 PM [facility] 1 [facility] None  04/28/2018  2:00 PM [facility] 1 [facility] None  04/29/2018  2:00 PM [facility] 1 [facility] None  04/30/2018 10:30 AM [facility] 1 [facility] None  05/01/2018 10:30 AM [facility] 1 [facility] None  05/04/2018 10:30 AM [facility] 1 [facility] None  05/05/2018 10:30 AM [facility] 1 [facility] None  05/06/2018 10:30 AM [facility] 1 [facility] None  05/07/2018 10:30 AM [facility] 1 [facility] None  05/08/2018 10:30 AM [facility] 1 [facility] None  05/11/2018 10:30 AM [facility] 1 [facility] None  05/12/2018 10:30 AM [facility] 1 [facility] None  05/13/2018 10:30 AM [facility] 1 [facility] None  05/14/2018 10:30 AM [facility] 1 [facility] None  05/15/2018 10:30 AM [facility] 1 [facility] None   05/18/2018 10:30 AM [facility] 1 [facility] None  05/19/2018 10:30 AM [facility] 1 [facility] None  05/20/2018 10:30 AM [facility] 1 [facility] None  05/21/2018 10:30 AM [facility] 1 [facility] None  05/22/2018 10:30 AM [facility] 1 [facility] None   Primary Care Physician: Derinda Late, MD Location: [facility] Note by: Gaspar Cola, MD Date: 04/08/2018; Time: 1:31 PM

## 2018-04-08 NOTE — Patient Instructions (Addendum)
____________________________________________________________________________________________  Preparing for your procedure (without sedation)  Instructions: . Oral Intake: Do not eat or drink anything for at least 3 hours prior to your procedure. . Transportation: Unless otherwise stated by your physician, you may drive yourself after the procedure. . Blood Pressure Medicine: Take your blood pressure medicine with a sip of water the morning of the procedure. . Blood thinners:  . Diabetics on insulin: Notify the staff so that you can be scheduled 1st case in the morning. If your diabetes requires high dose insulin, take only  of your normal insulin dose the morning of the procedure and notify the staff that you have done so. . Preventing infections: Shower with an antibacterial soap the morning of your procedure.  . Build-up your immune system: Take 1000 mg of Vitamin C with every meal (3 times a day) the day prior to your procedure. Marland Kitchen Antibiotics: Inform the staff if you have a condition or reason that requires you to take antibiotics before dental procedures. . Pregnancy: If you are pregnant, call and cancel the procedure. . Sickness: If you have a cold, fever, or any active infections, call and cancel the procedure. . Arrival: You must be in the facility at least 30 minutes prior to your scheduled procedure. . Children: Do not bring any children with you. . Dress appropriately: Bring dark clothing that you would not mind if they get stained. . Valuables: Do not bring any jewelry or valuables.  Procedure appointments are reserved for interventional treatments only. Marland Kitchen No Prescription Refills. . No medication changes will be discussed during procedure appointments. . No disability issues will be discussed.  Remember:  Regular Business hours are:  Monday to Thursday 8:00 AM to 4:00 PM  Provider's Schedule: Milinda Pointer, MD:  Procedure days: Tuesday and Thursday 7:30 AM to 4:00  PM  Gillis Santa, MD:  Procedure days: Monday and Wednesday 7:30 AM to 4:00 PM ____________________________________________________________________________________________   Pain Management Discharge Instructions  General Discharge Instructions :  If you need to reach your doctor call: Monday-Friday 8:00 am - 4:00 pm at [phone] or toll free [phone].  After clinic hours [phone] to have operator reach doctor.  Bring all of your medication bottles to all your appointments in the pain clinic.  To cancel or reschedule your appointment with Pain Management please remember to call 24 hours in advance to avoid a fee.  Refer to the educational materials which you have been given on: General Risks, I had my Procedure. Discharge Instructions, Post Sedation.  Post Procedure Instructions:  The drugs you were given will stay in your system until tomorrow, so for the next 24 hours you should not drive, make any legal decisions or drink any alcoholic beverages.  You may eat anything you prefer, but it is better to start with liquids then soups and crackers, and gradually work up to solid foods.  Please notify your doctor immediately if you have any unusual bleeding, trouble breathing or pain that is not related to your normal pain.  Depending on the type of procedure that was done, some parts of your body may feel week and/or numb.  This usually clears up by tonight or the next day.  Walk with the use of an assistive device or accompanied by an adult for the 24 hours.  You may use ice on the affected area for the first 24 hours.  Put ice in a Ziploc bag and cover with a towel and place against area 15 minutes on 15  minutes off.  You may switch to heat after 24 hours.GENERAL RISKS AND COMPLICATIONS  What are the risk, side effects and possible complications? Generally speaking, most procedures are safe.  However, with any procedure there are risks, side effects, and the possibility  of complications.  The risks and complications are dependent upon the sites that are lesioned, or the type of nerve block to be performed.  The closer the procedure is to the spine, the more serious the risks are.  Great care is taken when placing the radio frequency needles, block needles or lesioning probes, but sometimes complications can occur. 1. Infection: Any time there is an injection through the skin, there is a risk of infection.  This is why sterile conditions are used for these blocks.  There are four possible types of infection. 1. Localized skin infection. 2. Central Nervous System Infection-This can be in the form of Meningitis, which can be deadly. 3. Epidural Infections-This can be in the form of an epidural abscess, which can cause pressure inside of the spine, causing compression of the spinal cord with subsequent paralysis. This would require an emergency surgery to decompress, and there are no guarantees that the patient would recover from the paralysis. 4. Discitis-This is an infection of the intervertebral discs.  It occurs in about 1% of discography procedures.  It is difficult to treat and it may lead to surgery.        2. Pain: the needles have to go through skin and soft tissues, will cause soreness.       3. Damage to internal structures:  The nerves to be lesioned may be near blood vessels or    other nerves which can be potentially damaged.       4. Bleeding: Bleeding is more common if the patient is taking blood thinners such as  aspirin, Coumadin, Ticiid, Plavix, etc., or if he/she have some genetic predisposition  such as hemophilia. Bleeding into the spinal canal can cause compression of the spinal  cord with subsequent paralysis.  This would require an emergency surgery to  decompress and there are no guarantees that the patient would recover from the  paralysis.       5. Pneumothorax:  Puncturing of a lung is a possibility, every time a needle is introduced in  the area  of the chest or upper back.  Pneumothorax refers to free air around the  collapsed lung(s), inside of the thoracic cavity (chest cavity).  Another two possible  complications related to a similar event would include: Hemothorax and Chylothorax.   These are variations of the Pneumothorax, where instead of air around the collapsed  lung(s), you may have blood or chyle, respectively.       6. Spinal headaches: They may occur with any procedures in the area of the spine.       7. Persistent CSF (Cerebro-Spinal Fluid) leakage: This is a rare problem, but may occur  with prolonged intrathecal or epidural catheters either due to the formation of a fistulous  track or a dural tear.       8. Nerve damage: By working so close to the spinal cord, there is always a possibility of  nerve damage, which could be as serious as a permanent spinal cord injury with  paralysis.       9. Death:  Although rare, severe deadly allergic reactions known as "Anaphylactic  reaction" can occur to any of the medications used.      10.  Worsening of the symptoms:  We can always make thing worse.  What are the chances of something like this happening? Chances of any of this occuring are extremely low.  By statistics, you have more of a chance of getting killed in a motor vehicle accident: while driving to the hospital than any of the above occurring .  Nevertheless, you should be aware that they are possibilities.  In general, it is similar to taking a shower.  Everybody knows that you can slip, hit your head and get killed.  Does that mean that you should not shower again?  Nevertheless always keep in mind that statistics do not mean anything if you happen to be on the wrong side of them.  Even if a procedure has a 1 (one) in a 1,000,000 (million) chance of going wrong, it you happen to be that one..Also, keep in mind that by statistics, you have more of a chance of having something go wrong when taking medications.  Who should not have  this procedure? If you are on a blood thinning medication (e.g. Coumadin, Plavix, see list of "Blood Thinners"), or if you have an active infection going on, you should not have the procedure.  If you are taking any blood thinners, please inform your physician.  How should I prepare for this procedure?  Do not eat or drink anything at least six hours prior to the procedure.  Bring a driver with you .  It cannot be a taxi.  Come accompanied by an adult that can drive you back, and that is strong enough to help you if your legs get weak or numb from the local anesthetic.  Take all of your medicines the morning of the procedure with just enough water to swallow them.  If you have diabetes, make sure that you are scheduled to have your procedure done first thing in the morning, whenever possible.  If you have diabetes, take only half of your insulin dose and notify our nurse that you have done so as soon as you arrive at the clinic.  If you are diabetic, but only take blood sugar pills (oral hypoglycemic), then do not take them on the morning of your procedure.  You may take them after you have had the procedure.  Do not take aspirin or any aspirin-containing medications, at least eleven (11) days prior to the procedure.  They may prolong bleeding.  Wear loose fitting clothing that may be easy to take off and that you would not mind if it got stained with Betadine or blood.  Do not wear any jewelry or perfume  Remove any nail coloring.  It will interfere with some of our monitoring equipment.  NOTE: Remember that this is not meant to be interpreted as a complete list of all possible complications.  Unforeseen problems may occur.  BLOOD THINNERS The following drugs contain aspirin or other products, which can cause increased bleeding during surgery and should not be taken for 2 weeks prior to and 1 week after surgery.  If you should need take something for relief of minor pain, you may take  acetaminophen which is found in Tylenol,m Datril, Anacin-3 and Panadol. It is not blood thinner. The products listed below are.  Do not take any of the products listed below in addition to any listed on your instruction sheet.  A.P.C or A.P.C with Codeine Codeine Phosphate Capsules #3 Ibuprofen Ridaura  ABC compound Congesprin Imuran rimadil  Advil Cope Indocin Robaxisal  Alka-Seltzer  Effervescent Pain Reliever and Antacid Coricidin or Coricidin-D  Indomethacin Rufen  Alka-Seltzer plus Cold Medicine Cosprin Ketoprofen S-A-C Tablets  Anacin Analgesic Tablets or Capsules Coumadin Korlgesic Salflex  Anacin Extra Strength Analgesic tablets or capsules CP-2 Tablets Lanoril Salicylate  Anaprox Cuprimine Capsules Levenox Salocol  Anexsia-D Dalteparin Magan Salsalate  Anodynos Darvon compound Magnesium Salicylate Sine-off  Ansaid Dasin Capsules Magsal Sodium Salicylate  Anturane Depen Capsules Marnal Soma  APF Arthritis pain formula Dewitt's Pills Measurin Stanback  Argesic Dia-Gesic Meclofenamic Sulfinpyrazone  Arthritis Bayer Timed Release Aspirin Diclofenac Meclomen Sulindac  Arthritis pain formula Anacin Dicumarol Medipren Supac  Analgesic (Safety coated) Arthralgen Diffunasal Mefanamic Suprofen  Arthritis Strength Bufferin Dihydrocodeine Mepro Compound Suprol  Arthropan liquid Dopirydamole Methcarbomol with Aspirin Synalgos  ASA tablets/Enseals Disalcid Micrainin Tagament  Ascriptin Doan's Midol Talwin  Ascriptin A/D Dolene Mobidin Tanderil  Ascriptin Extra Strength Dolobid Moblgesic Ticlid  Ascriptin with Codeine Doloprin or Doloprin with Codeine Momentum Tolectin  Asperbuf Duoprin Mono-gesic Trendar  Aspergum Duradyne Motrin or Motrin IB Triminicin  Aspirin plain, buffered or enteric coated Durasal Myochrisine Trigesic  Aspirin Suppositories Easprin Nalfon Trillsate  Aspirin with Codeine Ecotrin Regular or Extra Strength Naprosyn Uracel  Atromid-S Efficin Naproxen Ursinus  Auranofin  Capsules Elmiron Neocylate Vanquish  Axotal Emagrin Norgesic Verin  Azathioprine Empirin or Empirin with Codeine Normiflo Vitamin E  Azolid Emprazil Nuprin Voltaren  Bayer Aspirin plain, buffered or children's or timed BC Tablets or powders Encaprin Orgaran Warfarin Sodium  Buff-a-Comp Enoxaparin Orudis Zorpin  Buff-a-Comp with Codeine Equegesic Os-Cal-Gesic   Buffaprin Excedrin plain, buffered or Extra Strength Oxalid   Bufferin Arthritis Strength Feldene Oxphenbutazone   Bufferin plain or Extra Strength Feldene Capsules Oxycodone with Aspirin   Bufferin with Codeine Fenoprofen Fenoprofen Pabalate or Pabalate-SF   Buffets II Flogesic Panagesic   Buffinol plain or Extra Strength Florinal or Florinal with Codeine Panwarfarin   Buf-Tabs Flurbiprofen Penicillamine   Butalbital Compound Four-way cold tablets Penicillin   Butazolidin Fragmin Pepto-Bismol   Carbenicillin Geminisyn Percodan   Carna Arthritis Reliever Geopen Persantine   Carprofen Gold's salt Persistin   Chloramphenicol Goody's Phenylbutazone   Chloromycetin Haltrain Piroxlcam   Clmetidine heparin Plaquenil   Cllnoril Hyco-pap Ponstel   Clofibrate Hydroxy chloroquine Propoxyphen         Before stopping any of these medications, be sure to consult the physician who ordered them.  Some, such as Coumadin (Warfarin) are ordered to prevent or treat serious conditions such as "deep thrombosis", "pumonary embolisms", and other heart problems.  The amount of time that you may need off of the medication may also vary with the medication and the reason for which you were taking it.  If you are taking any of these medications, please make sure you notify your pain physician before you undergo any procedures.         Epidural Steroid Injection Patient Information  Description: The epidural space surrounds the nerves as they exit the spinal cord.  In some patients, the nerves can be compressed and inflamed by a bulging disc or a  tight spinal canal (spinal stenosis).  By injecting steroids into the epidural space, we can bring irritated nerves into direct contact with a potentially helpful medication.  These steroids act directly on the irritated nerves and can reduce swelling and inflammation which often leads to decreased pain.  Epidural steroids may be injected anywhere along the spine and from the neck to the low back depending upon the location of your pain.   After numbing  the skin with local anesthetic (like Novocaine), a small needle is passed into the epidural space slowly.  You may experience a sensation of pressure while this is being done.  The entire block usually last less than 10 minutes.  Conditions which may be treated by epidural steroids:   Low back and leg pain  Neck and arm pain  Spinal stenosis  Post-laminectomy syndrome  Herpes zoster (shingles) pain  Pain from compression fractures  Preparation for the injection:  1. Do not eat any solid food or dairy products within 8 hours of your appointment.  2. You may drink clear liquids up to 3 hours before appointment.  Clear liquids include water, black coffee, juice or soda.  No milk or cream please. 3. You may take your regular medication, including pain medications, with a sip of water before your appointment  Diabetics should hold regular insulin (if taken separately) and take 1/2 normal NPH dos the morning of the procedure.  Carry some sugar containing items with you to your appointment. 4. A driver must accompany you and be prepared to drive you home after your procedure.  5. Bring all your current medications with your. 6. An IV may be inserted and sedation may be given at the discretion of the physician.   7. A blood pressure cuff, EKG and other monitors will often be applied during the procedure.  Some patients may need to have extra oxygen administered for a short period. 8. You will be asked to provide medical information, including your  allergies, prior to the procedure.  We must know immediately if you are taking blood thinners (like Coumadin/Warfarin)  Or if you are allergic to IV iodine contrast (dye). We must know if you could possible be pregnant.  Possible side-effects:  Bleeding from needle site  Infection (rare, may require surgery)  Nerve injury (rare)  Numbness & tingling (temporary)  Difficulty urinating (rare, temporary)  Spinal headache ( a headache worse with upright posture)  Light -headedness (temporary)  Pain at injection site (several days)  Decreased blood pressure (temporary)  Weakness in arm/leg (temporary)  Pressure sensation in back/neck (temporary)  Call if you experience:  Fever/chills associated with headache or increased back/neck pain.  Headache worsened by an upright position.  New onset weakness or numbness of an extremity below the injection site  Hives or difficulty breathing (go to the emergency room)  Inflammation or drainage at the infection site  Severe back/neck pain  Any new symptoms which are concerning to you  Please note:  Although the local anesthetic injected can often make your back or neck feel good for several hours after the injection, the pain will likely return.  It takes 3-7 days for steroids to work in the epidural space.  You may not notice any pain relief for at least that one week.  If effective, we will often do a series of three injections spaced 3-6 weeks apart to maximally decrease your pain.  After the initial series, we generally will wait several months before considering a repeat injection of the same type.  If you have any questions, please call [phone] [facility]

## 2018-04-09 ENCOUNTER — Other Ambulatory Visit: Payer: Self-pay

## 2018-04-09 ENCOUNTER — Ambulatory Visit (HOSPITAL_BASED_OUTPATIENT_CLINIC_OR_DEPARTMENT_OTHER): Payer: Medicare Other | Admitting: Pain Medicine

## 2018-04-09 ENCOUNTER — Encounter: Payer: Self-pay | Admitting: Hematology and Oncology

## 2018-04-09 ENCOUNTER — Inpatient Hospital Stay (HOSPITAL_BASED_OUTPATIENT_CLINIC_OR_DEPARTMENT_OTHER): Payer: Medicare Other | Admitting: Hematology and Oncology

## 2018-04-09 ENCOUNTER — Ambulatory Visit
Admission: RE | Admit: 2018-04-09 | Discharge: 2018-04-09 | Disposition: A | Discharge status: Home / Self Care | Payer: Medicare Other | Source: Ambulatory Visit | Attending: Pain Medicine | Admitting: Pain Medicine

## 2018-04-09 ENCOUNTER — Encounter: Payer: Self-pay | Admitting: Pain Medicine

## 2018-04-09 VITALS — BP 156/97 | HR 77 | Temp 97.6°F | Resp 18 | Ht 60.0 in | Wt 126.0 lb

## 2018-04-09 VITALS — BP 116/66 | HR 88 | Temp 97.3°F | Resp 18 | Wt 126.2 lb

## 2018-04-09 DIAGNOSIS — M545 Low back pain, unspecified: Secondary | ICD-10-CM

## 2018-04-09 DIAGNOSIS — E119 Type 2 diabetes mellitus without complications: Secondary | ICD-10-CM

## 2018-04-09 DIAGNOSIS — M25511 Pain in right shoulder: Secondary | ICD-10-CM | POA: Diagnosis present

## 2018-04-09 DIAGNOSIS — M5414 Radiculopathy, thoracic region: Secondary | ICD-10-CM

## 2018-04-09 DIAGNOSIS — Z888 Allergy status to other drugs, medicaments and biological substances status: Secondary | ICD-10-CM | POA: Diagnosis not present

## 2018-04-09 DIAGNOSIS — M546 Pain in thoracic spine: Secondary | ICD-10-CM | POA: Insufficient documentation

## 2018-04-09 DIAGNOSIS — C20 Malignant neoplasm of rectum: Secondary | ICD-10-CM

## 2018-04-09 DIAGNOSIS — Z880 Allergy status to penicillin: Secondary | ICD-10-CM | POA: Diagnosis not present

## 2018-04-09 DIAGNOSIS — Z7984 Long term (current) use of oral hypoglycemic drugs: Secondary | ICD-10-CM

## 2018-04-09 DIAGNOSIS — M549 Dorsalgia, unspecified: Secondary | ICD-10-CM

## 2018-04-09 DIAGNOSIS — M5114 Intervertebral disc disorders with radiculopathy, thoracic region: Secondary | ICD-10-CM | POA: Diagnosis not present

## 2018-04-09 DIAGNOSIS — D509 Iron deficiency anemia, unspecified: Secondary | ICD-10-CM | POA: Diagnosis not present

## 2018-04-09 DIAGNOSIS — M5134 Other intervertebral disc degeneration, thoracic region: Secondary | ICD-10-CM | POA: Diagnosis not present

## 2018-04-09 DIAGNOSIS — Z885 Allergy status to narcotic agent status: Secondary | ICD-10-CM | POA: Diagnosis not present

## 2018-04-09 DIAGNOSIS — Z8673 Personal history of transient ischemic attack (TIA), and cerebral infarction without residual deficits: Secondary | ICD-10-CM | POA: Diagnosis not present

## 2018-04-09 DIAGNOSIS — Z79899 Other long term (current) drug therapy: Secondary | ICD-10-CM

## 2018-04-09 DIAGNOSIS — R1011 Right upper quadrant pain: Secondary | ICD-10-CM | POA: Diagnosis not present

## 2018-04-09 DIAGNOSIS — Z7189 Other specified counseling: Secondary | ICD-10-CM

## 2018-04-09 MED ORDER — ROPIVACAINE HCL 2 MG/ML IJ SOLN
2.0000 mL | Freq: Once | INTRAMUSCULAR | Status: AC
  Administered 2018-04-09: 9 mL via EPIDURAL

## 2018-04-09 MED ORDER — ROPIVACAINE HCL 2 MG/ML IJ SOLN
INTRAMUSCULAR | Status: AC
  Filled 2018-04-09: qty 10

## 2018-04-09 MED ORDER — DEXAMETHASONE SODIUM PHOSPHATE 10 MG/ML IJ SOLN
10.0000 mg | Freq: Once | INTRAMUSCULAR | Status: AC
  Administered 2018-04-09: 10 mg

## 2018-04-09 MED ORDER — IOPAMIDOL (ISOVUE-M 200) INJECTION 41%
10.0000 mL | Freq: Once | INTRAMUSCULAR | Status: AC
  Administered 2018-04-09: 10 mL via EPIDURAL
  Filled 2018-04-09: qty 10

## 2018-04-09 MED ORDER — SODIUM CHLORIDE 0.9% FLUSH
2.0000 mL | Freq: Once | INTRAVENOUS | Status: AC
  Administered 2018-04-09: 10 mL

## 2018-04-09 MED ORDER — LIDOCAINE HCL 2 % IJ SOLN
20.0000 mL | Freq: Once | INTRAMUSCULAR | Status: AC
  Administered 2018-04-09: 400 mg

## 2018-04-09 MED ORDER — LIDOCAINE HCL 2 % IJ SOLN
INTRAMUSCULAR | Status: AC
Start: 2018-04-09 — End: ?
  Filled 2018-04-09: qty 20

## 2018-04-09 MED ORDER — SODIUM CHLORIDE 0.9 % IJ SOLN
INTRAMUSCULAR | Status: AC
  Filled 2018-04-09: qty 10

## 2018-04-09 MED ORDER — DEXAMETHASONE SODIUM PHOSPHATE 10 MG/ML IJ SOLN
INTRAMUSCULAR | Status: AC
Start: 2018-04-09 — End: ?
  Filled 2018-04-09: qty 1

## 2018-04-09 NOTE — Telephone Encounter (Signed)
Oral Oncology Patient Advocate Encounter  Destiny [name] has decided to not start Xeloda.  She will let us know if she decides to start.  [facility] [place] Phone [phone] Fax [phone] 04/09/2018 3:23 PM

## 2018-04-09 NOTE — Progress Notes (Signed)
Patient's Name: Destiny [name]  MRN: [id number]  Referring Provider: Derinda Late, MD  DOB: January 05, 1924  PCP: Derinda Late, MD  DOS: 04/09/2018  Note by: Gaspar Cola, MD  Service setting: Ambulatory outpatient  Specialty: Interventional Pain Management  Patient type: Established  Location: [facility] (AMB) Pain Management Facility  Visit type: Interventional Procedure   Primary Reason for Visit: Interventional Pain Management Treatment. CC: Shoulder Pain (right)  Procedure:          Anesthesia, Analgesia, Anxiolysis:  Type: Therapeutic Inter-Laminar Thoracic Epidural Block #1  Region: Posterior Thoracolumbar Level:  Laterality: Right-Sided Paraspinal  Type: Local Anesthesia Indication(s): Analgesia         Route: Infiltration (Edgar/IM) IV Access: Declined Sedation: Declined  Local Anesthetic: Lidocaine 1-2%   Indications: 1. DDD (degenerative disc disease), thoracic   2. Acute upper back pain (Right)   3. Acute right-sided low back pain without sciatica   4. Thoracic radiculitis (Right)    Pain Score: Pre-procedure: 5 /10 Post-procedure: 0-No pain/10  Pre-op Assessment:  Destiny [name] is a 84 y.o. (year old), female patient, seen today for interventional treatment. She  has a past surgical history that includes Knee surgery (Bilateral); Back surgery; Appendectomy; Tonsilectomy, adenoidectomy, bilateral myringotomy and tubes; Foot surgery (Bilateral); Flexible sigmoidoscopy (Left, 03/04/2018); Joint replacement; and Colonoscopy with propofol (N/A, 03/06/2018). Destiny [name] has a current medication list which includes the following prescription(s): acetaminophen, amlodipine, cholecalciferol, doxepin, ferrous sulfate, metformin, polyethylene glycol, telmisartan, and vitamin b-12. Her primarily concern today is the Shoulder Pain (right)     Initial Vital Signs:  Pulse/HCG Rate: 77ECG Heart Rate: 72 Temp: 97.6 F (36.4 C) Resp: 16 BP: 134/76 SpO2: 100 %  BMI: Estimated body mass index  is 24.61 kg/m as calculated from the following:   Height as of this encounter: 5' (1.524 m).   Weight as of this encounter: 126 lb (57.2 kg).  Risk Assessment: Allergies: Reviewed. She is allergic to penicillins; ampicillin; hydrocodone-homatropine; amoxicillin; citalopram; and tramadol.  Allergy Precautions: None required Coagulopathies: Reviewed. None identified.  Blood-thinner therapy: None at this time Active Infection(s): Reviewed. None identified. Destiny [name] is afebrile  Site Confirmation: Destiny [name] was asked to confirm the procedure and laterality before marking the site Procedure checklist: Completed Consent: Before the procedure and under the influence of no sedative(s), amnesic(s), or anxiolytics, the patient was informed of the treatment options, risks and possible complications. To fulfill our ethical and legal obligations, as recommended by the American Medical Association's Code of Ethics, I have informed the patient of my clinical impression; the nature and purpose of the treatment or procedure; the risks, benefits, and possible complications of the intervention; the alternatives, including doing nothing; the risk(s) and benefit(s) of the alternative treatment(s) or procedure(s); and the risk(s) and benefit(s) of doing nothing. The patient was provided information about the general risks and possible complications associated with the procedure. These may include, but are not limited to: failure to achieve desired goals, infection, bleeding, organ or nerve damage, allergic reactions, paralysis, and death. In addition, the patient was informed of those risks and complications associated to Spine-related procedures, such as failure to decrease pain; infection (i.e.: Meningitis, epidural or intraspinal abscess); bleeding (i.e.: epidural hematoma, subarachnoid hemorrhage, or any other type of intraspinal or peri-dural bleeding); organ or nerve damage (i.e.: Any type of peripheral nerve,  nerve root, or spinal cord injury) with subsequent damage to sensory, motor, and/or autonomic systems, resulting in permanent pain, numbness, and/or weakness of one or several areas  of the body; allergic reactions; (i.e.: anaphylactic reaction); and/or death. Furthermore, the patient was informed of those risks and complications associated with the medications. These include, but are not limited to: allergic reactions (i.e.: anaphylactic or anaphylactoid reaction(s)); adrenal axis suppression; blood sugar elevation that in diabetics may result in ketoacidosis or comma; water retention that in patients with history of congestive heart failure may result in shortness of breath, pulmonary edema, and decompensation with resultant heart failure; weight gain; swelling or edema; medication-induced neural toxicity; particulate matter embolism and blood vessel occlusion with resultant organ, and/or nervous system infarction; and/or aseptic necrosis of one or more joints. Finally, the patient was informed that Medicine is not an exact science; therefore, there is also the possibility of unforeseen or unpredictable risks and/or possible complications that may result in a catastrophic outcome. The patient indicated having understood very clearly. We have given the patient no guarantees and we have made no promises. Enough time was given to the patient to ask questions, all of which were answered to the patient's satisfaction. Destiny [name] has indicated that she wanted to continue with the procedure. Attestation: I, the ordering provider, attest that I have discussed with the patient the benefits, risks, side-effects, alternatives, likelihood of achieving goals, and potential problems during recovery for the procedure that I have provided informed consent. Date  Time: 04/09/2018  7:56 AM  Pre-Procedure Preparation:  Monitoring: As per clinic protocol. Respiration, ETCO2, SpO2, BP, heart rate and rhythm monitor placed and  checked for adequate function Safety Precautions: Patient was assessed for positional comfort and pressure points before starting the procedure. Time-out: I initiated and conducted the "Time-out" before starting the procedure, as per protocol. The patient was asked to participate by confirming the accuracy of the "Time Out" information. Verification of the correct person, site, and procedure were performed and confirmed by me, the nursing staff, and the patient. "Time-out" conducted as per Joint Commission's Universal Protocol (UP.01.01.01). Time: 7510  Description of Procedure:          Position: Prone Target Area: For Epidural Steroid injection(s), the target area is the  interlaminar space, initially targeting the lower border of the superior vertebral body lamina. Approach: Interlaminar approach. Area Prepped: Entire Posterior Thoracolumbar Region Prepping solution: ChloraPrep (2% chlorhexidine gluconate and 70% isopropyl alcohol) Safety Precautions: Aspiration looking for blood return was conducted prior to all injections. At no point did we inject any substances, as a needle was being advanced. No attempts were made at seeking any paresthesias. Safe injection practices and needle disposal techniques used. Medications properly checked for expiration dates. SDV (single dose vial) medications used. Description of the Procedure: Protocol guidelines were followed. The patient was placed in position over the fluoroscopy table. The target area was identified and the area prepped in the usual manner. Skin & deeper tissues infiltrated with local anesthetic. Appropriate amount of time allowed to pass for local anesthetics to take effect. The procedure needles were then advanced to the target area. The inferior aspect of the superior lamina was contacted and the needle walked caudad, until the lamina was cleared. The epidural space was identified using "loss-of-resistance technique" with 0.9% PF-NSS (2-64mL),  in a low friction 10cc LOR glass syringe. Proper needle placement was secured. Negative aspiration confirmed. Solution injected in intermittent fashion, asking for systemic symptoms every 0.5 cc of injectate. The needles were then removed and the area cleansed, making sure to leave some of the prepping solution behind to take advantage of its long term bactericidal  properties. Vitals:   04/09/18 0829 04/09/18 0833 04/09/18 0838 04/09/18 0844  BP: (!) 146/72 (!) 150/74 (!) 155/74 (!) 156/97  Pulse:      Resp: 17 17 19 18   Temp:      SpO2: 100% 99% 98% 100%  Weight:      Height:        Start Time: 0834 hrs. End Time: 0842 hrs. Materials:  Needle(s) Type: Epidural needle Gauge: 17G Length: 3.5-in Medication(s): Please see orders for medications and dosing details.  Imaging Guidance (Spinal):          Type of Imaging Technique: Fluoroscopy Guidance (Spinal) Indication(s): Assistance in needle guidance and placement for procedures requiring needle placement in or near specific anatomical locations not easily accessible without such assistance. Exposure Time: Please see nurses notes. Contrast: Before injecting any contrast, we confirmed that the patient did not have an allergy to iodine, shellfish, or radiological contrast. Once satisfactory needle placement was completed at the desired level, radiological contrast was injected. Contrast injected under live fluoroscopy. No contrast complications. See chart for type and volume of contrast used. Fluoroscopic Guidance: I was personally present during the use of fluoroscopy. "Tunnel Vision Technique" used to obtain the best possible view of the target area. Parallax error corrected before commencing the procedure. "Direction-depth-direction" technique used to introduce the needle under continuous pulsed fluoroscopy. Once target was reached, antero-posterior, oblique, and lateral fluoroscopic projection used confirm needle placement in all planes.  Images permanently stored in EMR. Interpretation: I personally interpreted the imaging intraoperatively. Adequate needle placement confirmed in multiple planes. Appropriate spread of contrast into desired area was observed. No evidence of afferent or efferent intravascular uptake. No intrathecal or subarachnoid spread observed. Permanent images saved into the patient's record.  Antibiotic Prophylaxis:   Anti-infectives (From admission, onward)   None     Indication(s): None identified  Post-operative Assessment:  Post-procedure Vital Signs:  Pulse/HCG Rate: 7776 Temp: 97.6 F (36.4 C) Resp: 18 BP: (!) 156/97 SpO2: 100 %  EBL: None  Complications: No immediate post-treatment complications observed by team, or reported by patient.  Note: The patient tolerated the entire procedure well. A repeat set of vitals were taken after the procedure and the patient was kept under observation following institutional policy, for this type of procedure. Post-procedural neurological assessment was performed, showing return to baseline, prior to discharge. The patient was provided with post-procedure discharge instructions, including a section on how to identify potential problems. Should any problems arise concerning this procedure, the patient was given instructions to immediately contact us, at any time, without hesitation. In any case, we plan to contact the patient by telephone for a follow-up status report regarding this interventional procedure.  Comments:  No additional relevant information.  Plan of Care    Imaging Orders     DG C-Arm 1-60 Min-No Report  Procedure Orders     Thoracic Epidural Injection  Medications ordered for procedure: Meds ordered this encounter  Medications  . iopamidol (ISOVUE-M) 41 % intrathecal injection 10 mL    Must be Myelogram-compatible. If not available, you may substitute with a water-soluble, non-ionic, hypoallergenic, myelogram-compatible radiological  contrast medium.  Marland Kitchen lidocaine (XYLOCAINE) 2 % (with pres) injection 400 mg  . sodium chloride flush (NS) 0.9 % injection 2 mL  . ropivacaine (PF) 2 mg/mL (0.2%) (NAROPIN) injection 2 mL  . dexamethasone (DECADRON) injection 10 mg   Medications administered: We administered iopamidol, lidocaine, sodium chloride flush, ropivacaine (PF) 2 mg/mL (0.2%), and dexamethasone.  See the medical record for exact dosing, route, and time of administration.  New Prescriptions   No medications on file   Disposition: Discharge home  Discharge Date & Time: 04/09/2018; 0848 hrs.   Physician-requested Follow-up: Return for post-procedure eval (2 wks), w/ Dionisio David, NP.  Future Appointments  Date Time Provider [place]  04/09/2018  1:30 PM Lequita Asal, MD [facility] None  04/13/2018  2:15 PM [facility] 1 [facility] None  04/14/2018  2:00 PM [facility] 1 [facility] None  04/15/2018  2:00 PM [facility] 1 [facility] None  04/17/2018  2:00 PM [facility] 1 [facility] None  04/20/2018  2:00 PM [facility] 1 [facility] None  04/21/2018  2:00 PM [facility] 1 [facility] None  04/22/2018  2:00 PM [facility] 1 [facility] None  04/23/2018  2:00 PM [facility] 1 [facility] None  04/24/2018  2:00 PM [facility] 1 [facility] None  04/27/2018 10:45 AM Vevelyn Francois, NP [facility] None  04/27/2018  2:00 PM [facility] 1 [facility] None  04/28/2018  2:00 PM [facility] 1 [facility] None  04/29/2018  2:00 PM [facility] 1 [facility] None  04/30/2018 10:30 AM [facility] 1 [facility] None  05/01/2018 10:30 AM [facility] 1 [facility] None  05/04/2018 10:30 AM [facility] 1 [facility] None  05/05/2018 10:30 AM [facility] 1 [facility] None  05/06/2018 10:30 AM [facility] 1 [facility] None  05/07/2018 10:30 AM [facility] 1 [facility] None  05/08/2018 10:30 AM [facility] 1 [facility] None  05/11/2018 10:30 AM [facility] 1 [facility] None  05/12/2018 10:30  AM [facility] 1 [facility] None  05/13/2018 10:30 AM [facility] 1 [facility] None  05/14/2018 10:30 AM [facility] 1 [facility] None  05/15/2018 10:30 AM [facility] 1 [facility] None  05/18/2018 10:30 AM [facility] 1 [facility] None  05/19/2018 10:30 AM [facility] 1 [facility] None  05/20/2018 10:30 AM [facility] 1 [facility] None  05/21/2018 10:30 AM [facility] 1 [facility] None  05/22/2018 10:30 AM [facility] 1 [facility] None   Primary Care Physician: Derinda Late, MD Location: [facility] Note by: Gaspar Cola, MD Date: 04/09/2018; Time: 8:58 AM  Disclaimer:  Medicine is not an Chief Strategy Officer. The only guarantee in medicine is that nothing is guaranteed. It is important to note that the decision to proceed with this intervention was based on the information collected from the patient. The Data and conclusions were drawn from the patient's questionnaire, the interview, and the physical examination. Because the information was provided in large part by the patient, it cannot be guaranteed that it has not been purposely or unconsciously manipulated. Every effort has been made to obtain as much relevant data as possible for this evaluation. It is important to note that the conclusions that lead to this procedure are derived in large part from the available data. Always take into account that the treatment will also be dependent on availability of resources and existing treatment guidelines, considered by other Pain Management Practitioners as being common knowledge and practice, at the time of the intervention. For Medico-Legal purposes, it is also important to point out that variation in procedural techniques and pharmacological choices are the acceptable norm. The indications, contraindications, technique, and results of the above procedure should only be interpreted and judged by a Board-Certified Interventional Pain Specialist with  extensive familiarity and expertise in the same exact procedure and technique.

## 2018-04-09 NOTE — Patient Instructions (Signed)

## 2018-04-09 NOTE — Progress Notes (Signed)
[facility] day:  04/09/2018   Chief Complaint: Destiny [name] is a 84 y.o. female with clinical stage IIA (T3N0M0) rectal carcinoma who is seen for assessment after interval hospitalization and discussion regarding direction of therapy.  HPI:  The patient was last seen in the medical oncology clinic on 03/24/2018.  At that time, we discussed potential treatment options at length.  She was undecided about direction of therapy.  She wanted to wait until her daughter's return.  We discussed management of constipation.  She was seen by [name], NP on 04/06/2018 for a 3 week history of low back pain.  She had previously undergone a work-up with imaging at [facility].  She has an appointment with Dr. Robb Matar for pain management.  Lidoderm patch was declined.  OTC ibuprofen and Tylenol were recommended.  Heat pack was also recommended.  She underwent CT simulation on 04/06/2018.  She was seen by Dr. Hope Pigeon on 04/08/2018.  Thoracic epidural injection done.  During the interim, patient complains of pain in her RUE abdominal pain. She notes that the pain began after eating today. She is complaining of bowel urgency that has increased in severity. Patient denies bleeding; no hematochezia, melena, or gross hematuria.    Past Medical History:  Diagnosis Date  . Allergy   . Cancer ([place]) 03/08/2018  . Diabetes mellitus without complication ([place])   . Hyperlipidemia   . Shingles   . Stroke [facility])     Past Surgical History:  Procedure Laterality Date  . APPENDECTOMY    . BACK SURGERY    . COLONOSCOPY WITH PROPOFOL N/A 03/06/2018   Procedure: COLONOSCOPY WITH PROPOFOL;  Surgeon: Virgel Manifold, MD;  Location: [facility] ENDOSCOPY;  Service: Endoscopy;  Laterality: N/A;  . FLEXIBLE SIGMOIDOSCOPY Left 03/04/2018   Procedure: Colonoscopy/FLEXIBLE SIGMOIDOSCOPY;  Surgeon: Virgel Manifold, MD;  Location: [facility] ENDOSCOPY;  Service:  Endoscopy;  Laterality: Left;  . FOOT SURGERY Bilateral    [place]  . JOINT REPLACEMENT    . KNEE SURGERY Bilateral   . TONSILECTOMY, ADENOIDECTOMY, BILATERAL MYRINGOTOMY AND TUBES      Family History  Problem Relation Age of Onset  . Stroke Mother   . Stroke Father     Social History:  reports that she has never smoked. She has never used smokeless tobacco. She reports that she does not drink alcohol or use drugs.  She lives alone, although her youngest grandson has recently been staying with her.  She is very active and "keeps her own house".  Her daughter's name is Destiny [name]. She is her medical power of attorney.  Her daughter's contact number is [phone].  Her daughter is traveling 03/17/2018 - 04/01/2018 (back 04/02/2018).  She is accompanied by her daughter.  Allergies:  Allergies  Allergen Reactions  . Penicillins     Has patient had a PCN reaction causing immediate rash, facial/tongue/throat swelling, SOB or lightheadedness with hypotension: Unknown Has patient had a PCN reaction causing severe rash involving mucus membranes or skin necrosis: Unknown Has patient had a PCN reaction that required hospitalization: Unknown Has patient had a PCN reaction occurring within the last 10 years: No If all of the above answers are "NO", then may proceed with Cephalosporin use.   . Ampicillin Rash  . Hydrocodone-Homatropine     Other reaction(s): Other (See Comments) tachycardia  . Amoxicillin Rash  . Citalopram Diarrhea  . Tramadol Itching    Current Medications: Current Outpatient Medications  Medication Sig Dispense Refill  . acetaminophen (TYLENOL) 325 MG tablet Take 2 tablets (650 mg total) by mouth every 6 (six) hours as needed for mild pain (or Fever >/= 101).    Marland Kitchen amLODipine (NORVASC) 2.5 MG tablet Take 1 tablet (2.5 mg total) by mouth daily. Take once daily if blood pressure > 160 14 tablet 0  . cholecalciferol (VITAMIN D) 1000 units tablet Take 1,000 Units by mouth  daily.    Marland Kitchen doxepin (SINEQUAN) 10 MG capsule TAKE ONE CAPSULE BY MOUTH EVERY NIGHT AT BEDTIME    . ferrous sulfate 325 (65 FE) MG tablet Take 1 tablet (325 mg total) by mouth 2 (two) times daily with a meal. 60 tablet 0  . metFORMIN (GLUCOPHAGE) 500 MG tablet TAKE ONE (1) TABLET BY MOUTH EVERY DAY    . polyethylene glycol (MIRALAX / GLYCOLAX) packet Take 17 g by mouth daily as needed for mild constipation or moderate constipation.    Marland Kitchen telmisartan (MICARDIS) 80 MG tablet Take 1 tablet by mouth daily.    . vitamin B-12 (CYANOCOBALAMIN) 1000 MCG tablet Take 1,000 mcg by mouth daily.     No current facility-administered medications for this visit.     Review of Systems  Constitutional: Negative for diaphoresis, fever, malaise/fatigue and weight loss (appetite 75%).       Feels ok.  HENT: Negative.  Negative for congestion, nosebleeds and sinus pain.   Eyes: Negative.  Negative for double vision, photophobia and pain.  Respiratory: Negative.  Negative for cough, hemoptysis, sputum production and shortness of breath.   Cardiovascular: Negative.  Negative for chest pain, palpitations, orthopnea, leg swelling and PND.  Gastrointestinal: Positive for abdominal pain (RUQ abdominal pain). Negative for blood in stool (no interval hematochezia), constipation, diarrhea, heartburn, melena, nausea and vomiting.       Bowel urgency  Genitourinary: Negative.  Negative for dysuria, frequency, hematuria and urgency.  Musculoskeletal: Positive for back pain (chronic). Negative for falls, joint pain, myalgias and neck pain.       Thoracic epidural on 04/08/2018.  Skin: Negative.  Negative for itching.  Neurological: Negative for dizziness, tremors, sensory change, weakness and headaches.       H/o mild CVA 07/29/2017 (no residual deficits).  Endo/Heme/Allergies: Does not bruise/bleed easily.       Diabetes  Psychiatric/Behavioral: The patient is not nervous/anxious and does not have insomnia.   All other  systems reviewed and are negative.  Performance status (ECOG): 1 - Symptomatic but completely ambulatory  Physical Exam: Blood pressure 116/66, pulse 88, temperature (!) 97.3 F (36.3 C), temperature source Tympanic, resp. rate 18, weight 126 lb 3.2 oz (57.2 kg). GENERAL:  Well developed, well nourished, elderly woman sitting comfortably in the exam room in no acute distress. MENTAL STATUS:  Alert and oriented to person, place and time. HEAD:  Short styled gray hair.  Normocephalic, atraumatic, face symmetric, no Cushingoid features. EYES:  Glasses.  Blue eyes.  No conjunctivitis or scleral icterus. NEUROLOGICAL: Unremarkable. PSYCH:  Appropriate.    Office Visit on 04/08/2018  Component Date Value Ref Range Status  . Sed Rate 04/08/2018 6  0 - 40 mm/hr Final  . CRP 04/08/2018 2  0 - 10 mg/L Final                 **Please note reference interval change**  . Vitamin B-12 04/08/2018 947  232 - 1,245 pg/mL Final  . Magnesium 04/08/2018 1.8  1.6 - 2.3 mg/dL Final  . 25-Hydroxy, Vitamin  D 04/08/2018 WILL FOLLOW   Preliminary  . 25-Hydroxy, Vitamin D-2 04/08/2018 WILL FOLLOW   Preliminary  . 25-Hydroxy, Vitamin D-3 04/08/2018 WILL FOLLOW   Preliminary    Assessment:  Destiny [name] is a 84 y.o. female with clinical stage IIA (T3N0M0) adenocarcinoma of the rectum s/p biopsy on 03/04/2018.  She presented with several weeks of hematochezia.  CEA was 30.3 on 03/05/2018.  Flexible sigmoidoscopyon 03/04/2018 revealed an ulcerated non-obstructing large mass in the rectum. The mass was partially circumferential involving 1/2 of the lumen circumference. Mass was 5 cm in length. Biopsy confirmed well to moderately differentiated adenocarcinoma.  Colonoscopy on 03/06/2018 revealed a 7 mm polyp in the hepatic flexure (tubular adenoma), a 4 mm polyp in the sigmoid colon (tubular adenoma), thickened folds in the cecum (early hyperplastic polyp), and the known rectal carcinoma.  Chest, abdomen, and  pelvic CTon 03/04/2018 revealed eccentric mural thickening along the posterior wall of the rectum.There was no local or definite metastatic disease. Therewasa 5 mm hypodensity adjacent to the gallbladder fossa too small to further characterize, felt to represent a small cyst. There were bilateral renal cysts.  Pelvic MRI on 03/07/2018 revealed T2/early T3N0 rectal adenocarcinoma.  Tumor was 6.5 cm from anal verge and 2.3 cm from anal sphincter.  Tumor was 3.2 x 1.6 cm.  There was extension through muscularis propria.  There was no well-defined extension beyond the muscularis propria. There was subtle soft tissue thickening at the site of tumor (desmoplastic reaction or microscopic perirectal extension).  There was no extramural vascular invasion/thrombus, invasion of anterior peritoneal reflection of involvement of adjacent organs or pelvic sidewall structures.  Mesorectal lymph nodes or extra-mesorectal lymphadenopathy was absent.  PET scan on 03/13/2018 revealed hypermetabolic rectal neoplasm (SUV 9.46), but no locoregional adenopathy or metastatic disease.  She has mild iron deficiency anemia.  Hemoglobin is 10.6.  Ferritin was 32 with an iron saturation of 12% on 03/05/2018.  She has a history of CVA in 07/2017.  Symptomatically, she has had bowel urgency. She is complaining of RUQ abdominal pain after eating today. She had a thoracic epidural yesterday, which allowed for some relief of her back pain. Exam is stable.  Plan: 1. Discuss treatment options for stage IIA rectal cancer and patient's thoughts about pursuing therapy.  APR with end colostomy.  Possible chemotherapy and radiation if lymph node positive disease detected.   Concurrent chemotherapy and radiation with no surgery and followed with close surveillance.  Approximately 20% chance of CR.    Concurrent chemotherapy and radiation followed by surgery with low anastomosis.  Discuss issues outlined by Dr. Audie Clear including  concern for fecal incontinence issues and poor quality of life.  Discuss side effects of radiation and chemotherapy in detail.   2. Discuss Xeloda. Side effects reviewed.  Discuss issues with renal function and good hydration.  CrCl needs to be > 30 ml/min.  Discuss other option of CI FU 5 days/week with a pump.  Patient would need a port-a-cath. 3. Patient electing to to pursue surgical option with Dr. Audie Clear. She understands that the APR, with resulting colostomy, may not cure her disease. She may still require post surgical chemotherapy and radiation.  4. RTC 2 weeks after surgery. Will need to call for an appointment.    Honor Loh, NP  04/09/2018, 2:48 PM   I saw and evaluated the patient, participating in the key portions of the service and reviewing pertinent diagnostic studies and records.  I reviewed the nurse practitioner's note  and agree with the findings and the plan.  The assessment and plan were discussed with the patient.  Multiple questions were asked by the patient and answered. A total of (20) minutes of face-to-face time was spent with the patient with greater than 50% of that time in counseling and care-coordination.    Nolon Stalls, MD 04/09/2018,2:48 PM

## 2018-04-09 NOTE — Progress Notes (Signed)
Safety precautions to be maintained throughout the outpatient stay will include: orient to surroundings, keep bed in low position, maintain call bell within reach at all times, provide assistance with transfer out of bed and ambulation.  

## 2018-04-09 NOTE — Progress Notes (Signed)
Patient here for follow up. C/o pain on right side under breast. She states it started after she ate. Pt states it has happened before where she would have pain to her right ribs and radiated to back right side.

## 2018-04-10 ENCOUNTER — Telehealth: Payer: Self-pay

## 2018-04-10 MED ORDER — NEOMYCIN 500 MG TABLET
ORAL_TABLET | 0 refills | 0 days | Status: SS
Start: 2018-04-10 — End: 2018-05-12

## 2018-04-10 MED ORDER — BISACODYL 5 MG TABLET
ORAL_TABLET | 0 refills | 0 days | Status: SS
Start: 2018-04-10 — End: 2018-05-12

## 2018-04-10 MED ORDER — POLYETHYLENE GLYCOL 3350 17 GRAM/DOSE ORAL POWDER
0 refills | 0 days | Status: SS
Start: 2018-04-10 — End: 2018-05-12

## 2018-04-10 MED ORDER — ERYTHROMYCIN 500 MG TABLET
ORAL_TABLET | 0 refills | 0 days | Status: SS
Start: 2018-04-10 — End: 2018-05-12

## 2018-04-10 NOTE — Telephone Encounter (Signed)
Post procedure phone call.  Patient states she is doing well.  

## 2018-04-12 LAB — C-REACTIVE PROTEIN: CRP: 2 mg/L (ref 0–10)

## 2018-04-12 LAB — 25-HYDROXY VITAMIN D LCMS D2+D3: 25-Hydroxy, Vitamin D: 50 ng/mL

## 2018-04-12 LAB — 25-HYDROXYVITAMIN D LCMS D2+D3: 25-HYDROXY, VITAMIN D-3: 49 ng/mL

## 2018-04-12 LAB — MAGNESIUM: MAGNESIUM: 1.8 mg/dL (ref 1.6–2.3)

## 2018-04-12 LAB — SEDIMENTATION RATE: Sed Rate: 6 mm/hr (ref 0–40)

## 2018-04-12 LAB — VITAMIN B12: VITAMIN B 12: 947 pg/mL (ref 232–1245)

## 2018-04-13 ENCOUNTER — Ambulatory Visit: Payer: Medicare Other

## 2018-04-14 ENCOUNTER — Ambulatory Visit: Payer: Medicare Other

## 2018-04-15 ENCOUNTER — Ambulatory Visit: Payer: Medicare Other

## 2018-04-17 ENCOUNTER — Ambulatory Visit: Payer: Medicare Other

## 2018-04-20 ENCOUNTER — Ambulatory Visit: Payer: Medicare Other

## 2018-04-21 ENCOUNTER — Ambulatory Visit: Payer: Medicare Other

## 2018-04-22 ENCOUNTER — Ambulatory Visit: Payer: Medicare Other

## 2018-04-23 ENCOUNTER — Ambulatory Visit: Payer: Medicare Other

## 2018-04-24 ENCOUNTER — Ambulatory Visit: Payer: Medicare Other

## 2018-04-27 ENCOUNTER — Other Ambulatory Visit: Payer: Self-pay

## 2018-04-27 ENCOUNTER — Ambulatory Visit: Payer: Medicare Other | Attending: Nurse Practitioner | Admitting: Nurse Practitioner

## 2018-04-27 ENCOUNTER — Ambulatory Visit: Payer: Medicare Other

## 2018-04-27 ENCOUNTER — Encounter: Payer: Self-pay | Admitting: Nurse Practitioner

## 2018-04-27 VITALS — BP 140/67 | HR 84 | Temp 97.5°F | Ht 60.0 in | Wt 125.0 lb

## 2018-04-27 DIAGNOSIS — Z885 Allergy status to narcotic agent status: Secondary | ICD-10-CM | POA: Diagnosis not present

## 2018-04-27 DIAGNOSIS — Z79899 Other long term (current) drug therapy: Secondary | ICD-10-CM | POA: Insufficient documentation

## 2018-04-27 DIAGNOSIS — M47816 Spondylosis without myelopathy or radiculopathy, lumbar region: Secondary | ICD-10-CM | POA: Insufficient documentation

## 2018-04-27 DIAGNOSIS — Z8673 Personal history of transient ischemic attack (TIA), and cerebral infarction without residual deficits: Secondary | ICD-10-CM | POA: Insufficient documentation

## 2018-04-27 DIAGNOSIS — M533 Sacrococcygeal disorders, not elsewhere classified: Secondary | ICD-10-CM | POA: Diagnosis not present

## 2018-04-27 DIAGNOSIS — Z880 Allergy status to penicillin: Secondary | ICD-10-CM | POA: Insufficient documentation

## 2018-04-27 DIAGNOSIS — M7918 Myalgia, other site: Secondary | ICD-10-CM | POA: Diagnosis not present

## 2018-04-27 DIAGNOSIS — E119 Type 2 diabetes mellitus without complications: Secondary | ICD-10-CM | POA: Diagnosis not present

## 2018-04-27 DIAGNOSIS — Z7902 Long term (current) use of antithrombotics/antiplatelets: Secondary | ICD-10-CM | POA: Insufficient documentation

## 2018-04-27 DIAGNOSIS — Z961 Presence of intraocular lens: Secondary | ICD-10-CM | POA: Insufficient documentation

## 2018-04-27 DIAGNOSIS — D123 Benign neoplasm of transverse colon: Secondary | ICD-10-CM | POA: Diagnosis not present

## 2018-04-27 DIAGNOSIS — E785 Hyperlipidemia, unspecified: Secondary | ICD-10-CM | POA: Insufficient documentation

## 2018-04-27 DIAGNOSIS — I10 Essential (primary) hypertension: Secondary | ICD-10-CM | POA: Insufficient documentation

## 2018-04-27 DIAGNOSIS — M5114 Intervertebral disc disorders with radiculopathy, thoracic region: Secondary | ICD-10-CM | POA: Insufficient documentation

## 2018-04-27 DIAGNOSIS — M5414 Radiculopathy, thoracic region: Secondary | ICD-10-CM

## 2018-04-27 DIAGNOSIS — C20 Malignant neoplasm of rectum: Secondary | ICD-10-CM | POA: Diagnosis not present

## 2018-04-27 DIAGNOSIS — G8929 Other chronic pain: Secondary | ICD-10-CM | POA: Diagnosis present

## 2018-04-27 DIAGNOSIS — K219 Gastro-esophageal reflux disease without esophagitis: Secondary | ICD-10-CM | POA: Insufficient documentation

## 2018-04-27 DIAGNOSIS — Z888 Allergy status to other drugs, medicaments and biological substances status: Secondary | ICD-10-CM | POA: Insufficient documentation

## 2018-04-27 DIAGNOSIS — G894 Chronic pain syndrome: Secondary | ICD-10-CM | POA: Insufficient documentation

## 2018-04-27 DIAGNOSIS — D125 Benign neoplasm of sigmoid colon: Secondary | ICD-10-CM | POA: Diagnosis not present

## 2018-04-27 DIAGNOSIS — M545 Low back pain: Secondary | ICD-10-CM | POA: Diagnosis present

## 2018-04-27 DIAGNOSIS — M25552 Pain in left hip: Secondary | ICD-10-CM | POA: Diagnosis not present

## 2018-04-27 DIAGNOSIS — M25551 Pain in right hip: Secondary | ICD-10-CM | POA: Insufficient documentation

## 2018-04-27 DIAGNOSIS — Z7984 Long term (current) use of oral hypoglycemic drugs: Secondary | ICD-10-CM | POA: Insufficient documentation

## 2018-04-27 NOTE — Progress Notes (Signed)
Patient's Name: Destiny [name]  MRN: [id number]  Referring Provider: Derinda Late, MD  DOB: Feb 16, 1924  PCP: Derinda Late, MD  DOS: 04/27/2018  Note by: Vevelyn Francois NP  Service setting: Ambulatory outpatient  Specialty: Interventional Pain Management  Location: [facility] (AMB) Pain Management Facility    Patient type: Established    Primary Reason(s) for Visit: Encounter for prescription drug management & post-procedure evaluation of chronic illness with mild to moderate exacerbation(Level of risk: moderate) CC: Back Pain (lower)  HPI  Ms. Destiny [name] is a 84 y.o. year old, female patient, who comes today for a post-procedure evaluation and medication management. She has Chronic myofascial pain; Esophageal reflux; Benign essential hypertension; Migraine; Other and unspecified hyperlipidemia; Pseudophakia, both eyes; Salzmann's nodular dystrophy; Type II or unspecified type diabetes mellitus without mention of complication, not stated as uncontrolled; CVA (cerebral vascular accident) ([place]); Diabetes mellitus type 2, uncomplicated ([place]); Chronic low back pain (Primary Area of Pain) (Bilateral) (L>R); Chronic sacroiliac joint pain (Bilateral) (L>R); Chronic hip pain (Bilateral) (L>R); Chronic pain syndrome; Osteoarthritis of sacroiliac joints (Bilateral); Osteoarthritis of hip (Bilateral); Osteoarthritis of lumbar spine; Lumbar spondylosis; Long term current use of anticoagulant (Plavix); DDD (degenerative disc disease), lumbar; Lumbar facet hypertrophy; Lumbar facet syndrome (Bilateral); Trochanteric bursitis of hip (Right); Trigger point with back pain (buttocks area) (Right); Groin pain (Right); Spondylosis without myelopathy or radiculopathy, lumbosacral region; Lower GI bleed; GI bleed; Internal hemorrhoids; Nodule of rectum; Intestinal neoplasm; Hematochezia; Benign neoplasm of transverse colon; Polyp of sigmoid colon; Intestinal lump; Malignant neoplasm of rectum ([place]); Diverticulosis of large intestine  without diverticulitis; Goals of care, counseling/discussion; Other specified dorsopathies, sacral and sacrococcygeal region; Pharmacologic therapy; Disorder of skeletal system; Problems influencing health status; Thoracic radiculitis (Right); Acute low back pain without sciatica; Acute upper back pain (Right); and DDD (degenerative disc disease), thoracic on their problem list. Her primarily concern today is the Back Pain (lower)  Pain Assessment: Location: Lower Back Radiating: Denies Onset: More than a month ago Duration: Chronic pain Quality: Aching, Nagging Severity: 5 /10 (subjective, self-reported pain score)  Note: Reported level is compatible with observation.                          Effect on ADL: limits movement and lifting Timing: Constant Modifying factors: Denies BP: 140/67  HR: 84  [name] was last seen on 04/10/2018 for a procedure. During today's appointment we reviewed [name]'s post-procedure results, as well as her outpatient medication regimen. She admits that she has rectal cancer and will be under going surgery and will have a colostomy.   Further details on both, my assessment(s), as well as the proposed treatment plan, please see below.  Post-Procedure Assessment  04/09/2018 Procedure: Thoracic Epidural Block #1  Pre-procedure pain score:  5/10 Post-procedure pain score: 4/10         Influential Factors: BMI: 24.41 kg/m Intra-procedural challenges: None observed.         Assessment challenges: None detected.              Reported side-effects: None.        Post-procedural adverse reactions or complications: None reported         Sedation: Please see nurses note. When no sedatives are used, the analgesic levels obtained are directly associated to the effectiveness of the local anesthetics. However, when sedation is provided, the level of analgesia obtained during the initial 1 hour following the intervention, is believed to be the  result of a combination of  factors. These factors may include, but are not limited to: 1. The effectiveness of the local anesthetics used. 2. The effects of the analgesic(s) and/or anxiolytic(s) used. 3. The degree of discomfort experienced by the patient at the time of the procedure. 4. The patients ability and reliability in recalling and recording the events. 5. The presence and influence of possible secondary gains and/or psychosocial factors. Reported result: Relief experienced during the 1st hour after the procedure: 100 % (Ultra-Short Term Relief)            Interpretative annotation: Clinically appropriate result. Analgesia during this period is likely to be Local Anesthetic and/or IV Sedative (Analgesic/Anxiolytic) related.          Effects of local anesthetic: The analgesic effects attained during this period are directly associated to the localized infiltration of local anesthetics and therefore cary significant diagnostic value as to the etiological location, or anatomical origin, of the pain. Expected duration of relief is directly dependent on the pharmacodynamics of the local anesthetic used. Long-acting (4-6 hours) anesthetics used.  Reported result: Relief during the next 4 to 6 hour after the procedure: 100 % (Short-Term Relief)            Interpretative annotation: Clinically appropriate result. Analgesia during this period is likely to be Local Anesthetic-related.          Long-term benefit: Defined as the period of time past the expected duration of local anesthetics (1 hour for short-acting and 4-6 hours for long-acting). With the possible exception of prolonged sympathetic blockade from the local anesthetics, benefits during this period are typically attributed to, or associated with, other factors such as analgesic sensory neuropraxia, antiinflammatory effects, or beneficial biochemical changes provided by agents other than the local anesthetics.  Reported result: Extended relief following procedure: 100  % (Long-Term Relief)            Interpretative annotation: Clinically appropriate result. Good relief. No permanent benefit expected. Inflammation plays a part in the etiology to the pain.          Current benefits: Defined as reported results that persistent at this point in time.   Analgesia: 100 %            Function: Ms. Peale reports improvement in function ROM: Ms. Cordle reports improvement in ROM Interpretative annotation: Complete relief.                Interpretation: Results would suggest a successful diagnostic intervention.                  Plan:  Please see "Plan of Care" for details.                Laboratory Chemistry  Inflammation Markers (CRP: Acute Phase) (ESR: Chronic Phase) Lab Results  Component Value Date   CRP 2 04/08/2018   ESRSEDRATE 6 04/08/2018                         Rheumatology Markers No results found for: RF, ANA, LABURIC, URICUR, LYMEIGGIGMAB, LYMEABIGMQN, HLAB27                      Renal Function Markers Lab Results  Component Value Date   BUN 18 04/06/2018   CREATININE 1.03 (H) 04/06/2018   GFRAA 53 (L) 04/06/2018   GFRNONAA 46 (L) 04/06/2018  Hepatic Function Markers Lab Results  Component Value Date   AST 19 04/06/2018   ALT 8 (L) 04/06/2018   ALBUMIN 4.0 04/06/2018   ALKPHOS 61 04/06/2018   LIPASE 36 03/02/2018                        Electrolytes Lab Results  Component Value Date   NA 142 04/06/2018   K 4.0 04/06/2018   CL 106 04/06/2018   CALCIUM 9.5 04/06/2018   MG 1.8 04/08/2018                        Neuropathy Markers Lab Results  Component Value Date   TKPTWSFK81 275 04/08/2018   HGBA1C 6.7 (H) 07/30/2017                        Bone Pathology Markers Lab Results  Component Value Date   25OHVITD1 50 04/08/2018   25OHVITD2 <1.0 04/08/2018   25OHVITD3 49 04/08/2018                         Coagulation Parameters Lab Results  Component Value Date   INR 0.94 07/30/2017   LABPROT  12.5 07/30/2017   APTT 34 07/30/2017   PLT 235 04/06/2018                        Cardiovascular Markers Lab Results  Component Value Date   TROPONINI <0.03 02/02/2018   HGB 12.8 04/06/2018   HCT 38.6 04/06/2018                         CA Markers No results found for: CEA, CA125, LABCA2                      Note: Lab results reviewed.  Recent Diagnostic Imaging Results  DG C-Arm 1-60 Min-No Report Fluoroscopy was utilized by the requesting physician.  No radiographic  interpretation.  DG Lumbar Spine Complete W/Bend CLINICAL DATA:  Back pain.  No known injury.  EXAM: LUMBAR SPINE - COMPLETE WITH BENDING VIEWS  COMPARISON:  MRI 01/14/2018.  FINDINGS: Diffuse severe multilevel degenerative change again noted. Mild lumbar scoliosis concave right. No acute bony abnormality identified. No flexion or extension abnormality identified. Aortoiliac and visceral atherosclerotic vascular disease. Pelvic calcifications consistent phleboliths.  IMPRESSION: 1. Diffuse multilevel degenerative change. Mild scoliosis concave right. No acute bony abnormality identified.  2.  Aortoiliac and visceral atherosclerotic vascular disease.  Electronically Signed   By: Marcello Moores  Register   On: 04/09/2018 08:07 DG Thoracic Spine 2 View CLINICAL DATA:  Thoracic radiculitis.  Acute upper back pain  EXAM: THORACIC SPINE 2 VIEWS  COMPARISON:  CT chest 03/04/2018  FINDINGS: Normal alignment. No fracture or mass. Mild disc degeneration and spurring throughout the thoracic spine  IMPRESSION: No acute abnormality.  Electronically Signed   By: Franchot Gallo M.D.   On: 04/09/2018 08:07  Complexity Note: Imaging results reviewed. Results shared with Ms. Robidoux, using Layman's terms.                         Meds   Current Outpatient Medications:  .  acetaminophen (TYLENOL) 325 MG tablet, Take 2 tablets (650 mg total) by mouth every 6 (six) hours as needed for mild pain (or Fever >/= 101).,  Disp: , Rfl:  .  amLODipine (NORVASC) 2.5 MG tablet, Take 1 tablet (2.5 mg total) by mouth daily. Take once daily if blood pressure > 160, Disp: 14 tablet, Rfl: 0 .  cholecalciferol (VITAMIN D) 1000 units tablet, Take 1,000 Units by mouth daily., Disp: , Rfl:  .  doxepin (SINEQUAN) 10 MG capsule, TAKE ONE CAPSULE BY MOUTH EVERY NIGHT AT BEDTIME, Disp: , Rfl:  .  ferrous sulfate 325 (65 FE) MG tablet, Take 1 tablet (325 mg total) by mouth 2 (two) times daily with a meal., Disp: 60 tablet, Rfl: 0 .  metFORMIN (GLUCOPHAGE) 500 MG tablet, TAKE ONE (1) TABLET BY MOUTH EVERY DAY, Disp: , Rfl:  .  polyethylene glycol (MIRALAX / GLYCOLAX) packet, Take 17 g by mouth daily as needed for mild constipation or moderate constipation., Disp: , Rfl:  .  telmisartan (MICARDIS) 80 MG tablet, Take 1 tablet by mouth daily., Disp: , Rfl:  .  vitamin B-12 (CYANOCOBALAMIN) 1000 MCG tablet, Take 1,000 mcg by mouth daily., Disp: , Rfl:   ROS  Constitutional: Denies any fever or chills Gastrointestinal: No reported hemesis, hematochezia, vomiting, or acute GI distress Musculoskeletal: Denies any acute onset joint swelling, redness, loss of ROM, or weakness Neurological: No reported episodes of acute onset apraxia, aphasia, dysarthria, agnosia, amnesia, paralysis, loss of coordination, or loss of consciousness  Allergies  Ms. Caraher is allergic to penicillins; ampicillin; hydrocodone-homatropine; amoxicillin; citalopram; and tramadol.  PFSH  Drug: Ms. Innocent  reports that she does not use drugs. Alcohol:  reports that she does not drink alcohol. Tobacco:  reports that she has never smoked. She has never used smokeless tobacco. Medical:  has a past medical history of Allergy, Cancer ([place]) (03/08/2018), Diabetes mellitus without complication ([place]), Hyperlipidemia, Shingles, and Stroke ([place]). Surgical: Ms. Pulcini  has a past surgical history that includes Knee surgery (Bilateral); Back surgery; Appendectomy; Tonsilectomy,  adenoidectomy, bilateral myringotomy and tubes; Foot surgery (Bilateral); Flexible sigmoidoscopy (Left, 03/04/2018); Joint replacement; and Colonoscopy with propofol (N/A, 03/06/2018). Family: family history includes Stroke in her father and mother.  Constitutional Exam  General appearance: Well nourished, well developed, and well hydrated. In no apparent acute distress Vitals:   04/27/18 1013  BP: 140/67  Pulse: 84  Temp: (!) 97.5 F (36.4 C)  SpO2: 96%  Weight: 125 lb (56.7 kg)  Height: 5' (1.524 m)  Psych/Mental status: Alert, oriented x 3 (person, place, & time)       Eyes: PERLA Respiratory: No evidence of acute respiratory distress   Thoracic Spine Area Exam  Skin & Axial Inspection: No masses, redness, or swelling Alignment: Symmetrical Functional ROM: Unrestricted ROM Stability: No instability detected Muscle Tone/Strength: Functionally intact. No obvious neuro-muscular anomalies detected. Sensory (Neurological): Unimpaired Muscle strength & Tone: No palpable anomalies  Lumbar Spine Area Exam  Skin & Axial Inspection: No masses, redness, or swelling Alignment: Symmetrical Functional ROM: Unrestricted ROM       Stability: No instability detected Muscle Tone/Strength: Functionally intact. No obvious neuro-muscular anomalies detected. Sensory (Neurological): Unimpaired Palpation: Tender       Provocative Tests: Lumbar Hyperextension/rotation test: deferred today       Lumbar quadrant test (Kemp's test): deferred today       Lumbar Lateral bending test: deferred today       Patrick's Maneuver: deferred today                   FABER test: deferred today  Thigh-thrust test: deferred today       S-I compression test: deferred today       S-I distraction test: deferred today        Gait & Posture Assessment  Ambulation: Unassisted Gait: Relatively normal for age and body habitus Posture: WNL   Lower Extremity Exam    Side: Right lower extremity   Side: Left lower extremity  Stability: No instability observed          Stability: No instability observed          Skin & Extremity Inspection: Skin color, temperature, and hair growth are WNL. No peripheral edema or cyanosis. No masses, redness, swelling, asymmetry, or associated skin lesions. No contractures.  Skin & Extremity Inspection: Skin color, temperature, and hair growth are WNL. No peripheral edema or cyanosis. No masses, redness, swelling, asymmetry, or associated skin lesions. No contractures.  Functional ROM: Unrestricted ROM                  Functional ROM: Unrestricted ROM                  Muscle Tone/Strength: Functionally intact. No obvious neuro-muscular anomalies detected.  Muscle Tone/Strength: Functionally intact. No obvious neuro-muscular anomalies detected.  Sensory (Neurological): Unimpaired  Sensory (Neurological): Unimpaired  Palpation: No palpable anomalies  Palpation: No palpable anomalies   Assessment  Primary Diagnosis & Pertinent Problem List: The primary encounter diagnosis was Thoracic radiculitis (Right). Diagnoses of Chronic myofascial pain, Lumbar spondylosis, and Chronic pain syndrome were also pertinent to this visit.  Status Diagnosis  Controlled Controlled Persistent 1. Thoracic radiculitis (Right)   2. Chronic myofascial pain   3. Lumbar spondylosis   4. Chronic pain syndrome     Problems updated and reviewed during this visit: Problem  Malignant Neoplasm of Rectum ([facility])   Plan of Care  Pharmacotherapy (Medications Ordered): No orders of the defined types were placed in this encounter.  New Prescriptions   No medications on file   Medications administered today: Cataleah T. Perriello had no medications administered during this visit. Lab-work, procedure(s), and/or referral(s): No orders of the defined types were placed in this encounter.  Imaging and/or referral(s): None  Interventional therapies: Planned, scheduled, and/or pending:   Not  at this time. will call after surgery for repeat injection of lumbar spine   Considering: Diagnostic bilateral lumbar facet block Possible bilateral lumbar facet RFA Diagnostic bilateral sacroiliac joint block Possible bilateral sacroiliac joint RFA Diagnostic bilateral intra-articular hip joint injection Diagnostic bilateral femoral nerve block + obturator nerve block Possible bilateral femoral nerve + obturator nerve RFA Diagnostic right-sided trochanteric bursa injection   Palliative PRN treatment(s): Palliativeright-sided sacroiliac joint block       Provider-requested follow-up: Return for PRN Procedure.  No future appointments. Primary Care Physician: Derinda Late, MD Location: [facility] Note by: Vevelyn Francois NP Date: 04/27/2018; Time: 4:52 PM  Pain Score Disclaimer: We use the NRS-11 scale. This is a self-reported, subjective measurement of pain severity with only modest accuracy. It is used primarily to identify changes within a particular patient. It must be understood that outpatient pain scales are significantly less accurate that those used for research, where they can be applied under ideal controlled circumstances with minimal exposure to variables. In reality, the score is likely to be a combination of pain intensity and pain affect, where pain affect describes the degree of emotional arousal or changes in action readiness caused by the sensory experience of  pain. Factors such as social and work situation, setting, emotional state, anxiety levels, expectation, and prior pain experience may influence pain perception and show large inter-individual differences that may also be affected by time variables.  Patient instructions provided during this appointment: Patient Instructions

## 2018-04-28 ENCOUNTER — Ambulatory Visit: Payer: Medicare Other

## 2018-04-29 ENCOUNTER — Ambulatory Visit: Payer: Medicare Other

## 2018-04-29 ENCOUNTER — Telehealth: Payer: Self-pay | Admitting: *Deleted

## 2018-04-29 NOTE — Telephone Encounter (Signed)
Spoke with [name], informed her of Dr. Adalberto Cole advice.

## 2018-04-29 NOTE — Telephone Encounter (Signed)
Attempted to call patient to let her know that Dr Dossie Arbour said it would not be advised to have a procedure that close to her surgery date.  States the use of steroids could compromise her healing process and would be contraindicated as they would stay in system x 14 days.   No voicemail available.

## 2018-04-29 NOTE — Telephone Encounter (Signed)
Spoke with patient re; back pain.  States that she is having right sided pain in her hip area not going down the leg.  Patient most recently had procedure on 04/09/18, interlaminar thoracic epidural.  Patient has an upcoming surgery for colon cancer on 05/11/18.  She states that at her last appt for f/up with Crystal, she had thought that possibly it may be too soon for another procedure or possibly be ill advised with a surgery coming up on 05/11/18.

## 2018-04-30 ENCOUNTER — Ambulatory Visit: Payer: Medicare Other

## 2018-05-01 ENCOUNTER — Ambulatory Visit: Payer: Medicare Other

## 2018-05-04 ENCOUNTER — Ambulatory Visit: Payer: Medicare Other

## 2018-05-05 ENCOUNTER — Ambulatory Visit: Admit: 2018-05-05 | Discharge: 2018-05-05 | Payer: MEDICARE

## 2018-05-05 ENCOUNTER — Ambulatory Visit: Admit: 2018-05-05 | Discharge: 2018-05-05 | Payer: MEDICARE | Attending: Surgery | Primary: Surgery

## 2018-05-05 ENCOUNTER — Ambulatory Visit: Payer: Medicare Other

## 2018-05-05 DIAGNOSIS — Z01818 Encounter for other preprocedural examination: Principal | ICD-10-CM

## 2018-05-05 DIAGNOSIS — C20 Malignant neoplasm of rectum: Principal | ICD-10-CM

## 2018-05-06 ENCOUNTER — Ambulatory Visit: Payer: Medicare Other

## 2018-05-07 ENCOUNTER — Ambulatory Visit: Payer: Medicare Other

## 2018-05-08 ENCOUNTER — Ambulatory Visit: Payer: Medicare Other

## 2018-05-08 DIAGNOSIS — C20 Malignant neoplasm of rectum: Principal | ICD-10-CM

## 2018-05-11 ENCOUNTER — Ambulatory Visit
Admit: 2018-05-11 | Discharge: 2018-05-17 | Disposition: A | Discharge status: Home / Self Care | Payer: MEDICARE | Admitting: Surgery

## 2018-05-11 ENCOUNTER — Encounter
Admit: 2018-05-11 | Discharge: 2018-05-17 | Disposition: A | Discharge status: Home / Self Care | Payer: MEDICARE | Attending: Anesthesiology | Admitting: Surgery

## 2018-05-11 ENCOUNTER — Ambulatory Visit: Payer: Medicare Other

## 2018-05-11 DIAGNOSIS — C20 Malignant neoplasm of rectum: Principal | ICD-10-CM

## 2018-05-12 ENCOUNTER — Ambulatory Visit: Payer: Medicare Other

## 2018-05-13 ENCOUNTER — Ambulatory Visit: Payer: Medicare Other

## 2018-05-14 ENCOUNTER — Ambulatory Visit: Payer: Medicare Other

## 2018-05-15 ENCOUNTER — Ambulatory Visit: Payer: Medicare Other

## 2018-05-17 MED ORDER — ONDANSETRON HCL 4 MG TABLET
ORAL_TABLET | Freq: Three times a day (TID) | ORAL | 0 refills | 0.00000 days | Status: CP | PRN
Start: 2018-05-17 — End: 2018-05-17

## 2018-05-17 MED ORDER — OXYCODONE 5 MG TABLET
ORAL_TABLET | ORAL | 0 refills | 0.00000 days | Status: CP | PRN
Start: 2018-05-17 — End: 2018-05-21

## 2018-05-17 MED ORDER — ONDANSETRON HCL 4 MG TABLET: 4 mg | tablet | Freq: Three times a day (TID) | 0 refills | 0 days | Status: AC

## 2018-05-17 MED FILL — OXYCODONE HCL/5MG/TABS: OXYCODONE HCL/5MG/TABS | 3 days supply | Qty: 10 | Fill #0

## 2018-05-17 MED FILL — ONDANSETRON/4MG/TAB: ONDANSETRON/4MG/TAB | 5 days supply | Qty: 15 | Fill #0

## 2018-05-18 ENCOUNTER — Ambulatory Visit: Payer: Medicare Other

## 2018-05-19 ENCOUNTER — Ambulatory Visit: Payer: Medicare Other

## 2018-05-20 ENCOUNTER — Ambulatory Visit: Payer: Medicare Other

## 2018-05-21 ENCOUNTER — Ambulatory Visit: Admit: 2018-05-21 | Discharge: 2018-05-22 | Payer: MEDICARE

## 2018-05-21 ENCOUNTER — Ambulatory Visit: Payer: Medicare Other

## 2018-05-21 DIAGNOSIS — Z09 Encounter for follow-up examination after completed treatment for conditions other than malignant neoplasm: Principal | ICD-10-CM

## 2018-05-21 MED ORDER — OXYCODONE 5 MG TABLET
ORAL_TABLET | ORAL | 0 refills | 0 days | Status: CP | PRN
Start: 2018-05-21 — End: 2018-06-19

## 2018-05-22 ENCOUNTER — Ambulatory Visit: Payer: Medicare Other

## 2018-05-25 ENCOUNTER — Ambulatory Visit: Payer: Medicare Other

## 2018-05-26 ENCOUNTER — Ambulatory Visit: Payer: Medicare Other

## 2018-05-27 ENCOUNTER — Ambulatory Visit: Admit: 2018-05-27 | Discharge: 2018-05-28 | Payer: MEDICARE | Attending: Surgery | Primary: Surgery

## 2018-05-27 ENCOUNTER — Ambulatory Visit: Payer: Medicare Other

## 2018-05-27 DIAGNOSIS — C20 Malignant neoplasm of rectum: Principal | ICD-10-CM

## 2018-06-03 ENCOUNTER — Ambulatory Visit: Admit: 2018-06-03 | Discharge: 2018-06-04 | Payer: MEDICARE | Attending: Surgery | Primary: Surgery

## 2018-06-03 DIAGNOSIS — Z09 Encounter for follow-up examination after completed treatment for conditions other than malignant neoplasm: Principal | ICD-10-CM

## 2018-06-03 MED ORDER — OXYCODONE 5 MG CAPSULE
ORAL_CAPSULE | 0 refills | 0 days | Status: CP
Start: 2018-06-03 — End: 2018-06-19

## 2018-06-10 ENCOUNTER — Ambulatory Visit: Admit: 2018-06-10 | Discharge: 2018-06-11 | Payer: MEDICARE | Attending: Surgery | Primary: Surgery

## 2018-06-10 DIAGNOSIS — C20 Malignant neoplasm of rectum: Principal | ICD-10-CM

## 2018-06-18 NOTE — Unmapped (Signed)
ASSESSMENT AND PLAN:  Renee Houston is a 84 yo F from [place], [place], who presents for evaluation of pT3N2 rectal adenocarcinoma. She is now s/p APR 05/11/18.    1. Stage IIIB rectal adenocarcinoma. MSS.   -We discussed the history to date and the results of the pathology of the rectal cancer specimen, showing pT3N2 disease. We discussed that typically for a young, fit patient who were found to have stage III rectal cancer, we would recommend postoperative chemoradiation and chemotherapy with FOLFOX or CAPOX, with goals of minimizing chances of local recurrence and distant recurrence and thus improving overall survival. However, this decision is more involved given the patient's age. She has overall been very healthy, though she has had some decline in her performance status and independence postoperatively. However, I used ePrognosis.http://www.jones.org/ to calculate median survival estimated to be about 5 years - and thus need to be thoughtful about how much treatments would truly improve median survival. She's also had problems with wound healing, as evidenced by the perineal wound, and I would worry about the morbidity or toxicity of more locoregional therapy with chemoradiation. Thus, given all of these issues, I am ambivalent about chemoradiation, though leaning away from it. She is seeing Radiation Oncology today who overall agree with my assessment but can discuss in more detail.   -I would also recommend adjuvant chemotherapy with a fluoropyrimidine alone, rather than a fluoropyrimidine plus oxaliplatin. There is significant data that the doublet does not provide improved survival compared to fluoropyrimidine monotherapy in elderly patients with colon cancer, and I think it is reasonable to extrapolate from that data such that I would recommend single agent capecitabine. After extensive discussion, patient and her daughter are in agreement with proceeding with adjuvant capecitabine.  -We discussed side effects and toxicities of chemotherapy with capecitabine, including low Fromme blood cell count, low red blood cell count, low platelets, increased risk of infection, increased risk of bleeding, anemia, fatigue, kidney injury, liver injury, nausea, vomiting, diarrhea, hand-foot syndrome, mouth sores, and heart injury. Patient consented to chemotherapy with capecitabine today 06/19/18. Also asked our pharmacist to discuss and provide additional education.  -Check CBC with differential, CMP, and CEA.  -Will prescribe capecitabine 1000 mg/m2 po bid but with 25% dose reduction given creatinine clearance <50 (thus comprised of 2 tablets po QAM and 3 tablets po QPM for 14 days; followed by 7 days OFF; with cycles q21days and plan for 6 cycles)  -Will plan to start capecitabine tentatively 07/07/18, as would like to give a little more time for the wound to heal and performance status and energy level to improve after surgery. This will be just within 12 weeks from surgery, so will need to determine if we will proceed at that time or not.  -Goal of therapy is curative.    2. Supportive  -Nausea: none at present. Prescribe prochlorperazine prn with chemo (patient notes ondansetron caused headache)  -Diarrhea: none at present  -No significant pain  -No significant emotional distress    3. DM  -Continue metformin    4. HTN  -Continue telmisartan    5. Disposition  -Follow up with me 07/07/18      -----------------  REFERRING PHYSICIAN:  Lurlean Horns Sadiq     I was asked to see Renee Houston in consultation for evaluation of stage IIIB rectal adenocarcinoma, s/p APR 05/11/18    ONCOLOGIC HISTORY:  -03/02/18: Admitted at [facility] with several weeks of rectal bleeding  -03/04/18: Flexible sigmoidoscopy found ulcerated non-obstructing  rectal mass, partially circumferential and involving half of lumen circumference, 5 cm in length. Biopsy of rectum showed moderately differentiated invasive adenocarcinoma  -03/04/18: CT A/P with IV contrast showed no evidence of metastatic disease in the abdomen and pelvis. Known rectal mass, consistent with known malignancy better assessed on concurrent pelvic MRI.  -03/04/18: CT Chest showed bilateral subcentimeter pulmonary nodules, indeterminate. Recommend short interval follow-up (3 months), given reported history of rectal cancer. Cardiomegaly.  -03/05/18: CEA 30.3  -03/06/18: colonoscopy found 7 mm hepatic flexure polyp, 4 mm sigmoid polyp, thickened folds in cecum, and known rectal carcinoma. Cecal polyp favored early hyperplastic polyp, negative for dysplasia and malignancy. Colon polyps x 3 from hepatic flexure and sigmoid showed 6 fragments of tubular adenoma, negative for high grade dysplasia and malignancy  -03/13/18: PET-CT showed no evidence of metabolically active nodal or distant metastatic disease. Sensitivity may be decreased in the large bowel due to metformin use; uptake in primary rectal cancer is not clearly visible over and above diffuse hypermetabolism due to metformin.  -03/22/18: MRI Pelvis showed low rectal tumor radiographically staged as T3c, Nx, Mx. No obvious evidence of metastatic disease in the lymph nodes. However, a few tiny indeterminate perirectal and superior rectal chain lymph nodes are noted. The shortest distance between the tumor and MRF is 1.4 mm but this is evaluation is mildly limited due to motion artifacts. Clear sphincter margin. EMVI present.  -7/29-05/17/18: Admitted for APR. Discharged home with home health  -05/11/18: Robotic APR and robotic omental flap, with end sigmoid colostomy. Pathology of rectum and anus showed 4.4 cm moderately differentiated adenocarcinoma with mucinous features, invading into adjacent fat. Margins negative. 4/24 lymph nodes positive for metastatic carcinoma, and 2 soft tissue deposits present. No LVI or pNI. pT3N2. IHC showed proficient mismatch repair, and MSS by PCR  -Postoperatively developed two areas of wound dehiscence in perineum requiring weekly clinic checks.      HISTORY OF PRESENT ILLNESS:  Renee Houston is a 84 yo F from [place], [place], who presents for evaluation of rectal adenocarcinoma. She presented with several weeks of rectal bleeding in May 2019 and was found on sigmoidoscopy to have an ulcerated non-obstructing rectal mass, 5 cm in length, with biopsy showing moderately differentiated invasive adenocarcinoma. CT scans were notable only for indeterminate subcm pulmonary nodules but otherwise showed no evidence of malignancy. Completion colonoscopy did show multiple colonic polyps but no other areas of dysplasia or malignancy. MRI Pelvis found the low rectal tumor staged as T3cNx, though there were a few indeterminate perirectal and superior rectal chain lymph nodes noted. After extensive discussion about how to sequence therapies, the patient opted for upfront APR with end sigmoid colostomy with Dr. Elenore Rota on 05/11/18. Pathology from the surgery showed pT3N2 MSS moderately differentiated adenocarcinoma with mucinous features with 4/24 lymph nodes positive and 2 soft tissue deposits present. Her postoperative course was notable for perineal wound dehiscence requiring regular wound care. She now presents for evaluation of rectal cancer.    She reports recovering from surgery well, aside from the wound. She currently needs to pack the perineal wound daily. The wound is slowly healing and is not draining as much. She is hopeful that it will completely close within a couple of weeks. She has fair energy level. She currently lives alone, and home health comes 2-3 times per week. Daughter lives 5 minutes away. On an average day she fixes her own meals and light housework. She does note fatigue and spends more time sitting on  the couch. Hasn't really been going out to take a walk. She will occasionally accompany her daughter out of the house, but will stay in the car most of the time, and hasn't been going into the store to go shopping. She can change her own ostomy. Her appetite has been poor after surgery. Had lost about 5 lbs after surgery but now stabilized. She does also see home PT/OT who are encouraging her to walk with a Rollator. (She had not needed any assistance walking pre-surgery and had been able to leave the house and run errands and go shopping before).    She is accompanied in clinic by her daughter.  Notably her granddaughter is getting married Dec 8 and she would like to feel well at that time.      REVIEW OF SYSTEMS:  Constitutional: No fevers or chills. Weight now stable.  HEENT: No double vision or blurry vision.  RESP: No dyspnea or cough. No hemoptysis  CARDIAC: no chest pain or palpitations  GI: No nausea, vomiting, diarrhea, constipation, hematochezia, melena, dysphagia, or odynophagia. No abdominal pain.   GU: No dysuria or hematuria. +perineal wound packed daily and healing gradually  MSK: No new bony or joint pains.  DERM: No rashes or changes in skin.  HEM/LYMPH: No easy bleeding or bruising, no enlarged lymph nodes  NEURO: No dizziness, weakness, numbness, or tingling.  Remainder of review of systems negative.  ECOG PS 1-2      PAST MEDICAL HISTORY:  Past Medical History:   Diagnosis Date   ??? Diabetes mellitus (CMS-HCC)    ??? Generalized headaches    ??? Hard to intubate    ??? Hypertension    Rectal cancer as described in HPI  HTN  DM  Hx of mild stroke in Oct 2018 - reports having left arm weakness when she woke up. Says MRI did confirm stroke, but had near normalization of left hand function, and no limitation.  Hx of back surgery many years ago  Hx of bilateral knee replacements  Hx of bilateral cataract surgeries  Hx of appendectomy many years ago  Hx of tonsillectomy many years ago    FAMILY HISTORY:  An uncle had bone cancer  Another uncle had throat cancer    SOCIAL HISTORY:  Lives alone. Widowed. Has 1 daughter and 4 grandchildren. Retired; previously worked as a [employer] operational officer. Never smoked. No alcohol. No illicits.    ALLERGIES: Allergies   Allergen Reactions   ??? Ampicillin Hives   ??? Penicillins Other (See Comments) and Hives     Other reaction  Other reaction  Has patient had a PCN reaction causing immediate rash, facial/tongue/throat swelling, SOB or lightheadedness with hypotension: Unknown  Has patient had a PCN reaction causing severe rash involving mucus membranes or skin necrosis: Unknown  Has patient had a PCN reaction that required hospitalization: Unknown  Has patient had a PCN reaction occurring within the last 10 years: No  If all of the above answers are NO, then may proceed with Cephalosporin use.   ??? Hydrocodone-Homatropine Other (See Comments)     tachycardia   ??? Prednisone Other (See Comments)     mind spins   ??? Amoxicillin Rash   ??? Citalopram Diarrhea   ??? Tramadol Itching       MEDICATIONS:    Current Outpatient Medications:   ???  acetaminophen (TYLENOL) 500 MG tablet, Take 1,000 mg by mouth every four (4) hours as needed for pain., Disp: , Rfl:   ???  amLODIPine (NORVASC) 2.5 MG tablet, Take 2.5 mg by mouth nightly. , Disp: , Rfl:   ???  blood sugar diagnostic (ONETOUCH ULTRA BLUE TEST STRIP) Strp, USE EVERY DAY, Disp: , Rfl:   ???  butalbital-acetaminophen-caffeine (FIORICET, ESGIC) 50-325-40 mg per tablet, Take 1 tablet by mouth every four (4) hours as needed. , Disp: , Rfl:   ???  cholecalciferol, vitamin D3, 1,000 unit tablet, Take 1,000 Units by mouth daily. , Disp: , Rfl:   ???  cyanocobalamin 1000 MCG tablet, Take 1,000 mcg by mouth daily. , Disp: , Rfl:   ???  doxepin (SINEQUAN) 10 MG capsule, Take 10 mg by mouth nightly., Disp: , Rfl:   ???  ferrous sulfate 325 (65 FE) MG tablet, Take 325 mg by mouth daily. , Disp: , Rfl:   ???  metFORMIN (GLUCOPHAGE) 500 MG tablet, Take 500 mg by mouth daily. , Disp: , Rfl:   ???  ondansetron (ZOFRAN) 4 MG tablet, TAKE 1 TABLET BY MOUTH EVERY 8 HOURS AS NEEDED FOR NAUSEA, Disp: 15 each, Rfl: 0  ???  ondansetron (ZOFRAN-ODT) 4 MG disintegrating tablet, , Disp: , Rfl:   ???  oxyCODONE (OXY-IR) 5 mg capsule, 1/2 tablet every 8 hours as needed for pain, Disp: 10 capsule, Rfl: 0  ???  telmisartan (MICARDIS) 40 MG tablet, Take 40 mg by mouth daily., Disp: , Rfl:   ???  UNABLE TO FIND, Juice plus(powder)-supplement taken daily with liquid, Disp: , Rfl:         PHYSICAL EXAM:  VITALS: BP 134/72  - Pulse 79  - Temp 36.3 ??C (97.3 ??F) (Oral)  - Resp 18  - Ht 152.4 cm (5')  - Wt 57.5 kg (126 lb 12.2 oz)  - SpO2 100%  - BMI 24.76 kg/m??   GENERAL: well-built, well-appearing, in no acute distress  HEENT: Normocephalic, atraumatic. Sclerae anicteric. Conjunctivae without pallor. Oropharynx clear. Mucous membranes moist.  NECK: Supple. No palpable cervical or supraclavicular lymphadenopathy  RESP: Lungs clear to auscultation bilaterally.   CV: Regular rate and rhythm, normal S1 and S2, no murmurs/rubs/gallops  BACK: Nontender on palpation over midline spinous processes.  ABD: Soft, mildly tender on palpation over abdomen diffusely but without rebound or guarding, nondistended, normoactive bowel sounds. No palpable masses or hepatosplenomegaly.   EXTREMITIES: Warm and well perfused. No peripheral edema. 2+ radial and DP pulses bilaterally  SKIN: ~1-2 cm perineal/gluteal wound with packing noted with no significant surrounding erythema or warmth. No rashes noted  PSYCH: Affect appropriate.  NEURO: Alert and oriented x3. Pupils equal, round, and reactive to light. Normal gait.      LABS:  Reviewed. Notably following labs:  Lab Results   Component Value Date    WBC 11.2 (H) 05/12/2018    HGB 10.4 (L) 05/12/2018    HCT 33.0 (L) 05/12/2018    PLT 217 05/12/2018       Lab Results   Component Value Date    NA 143 05/05/2018    K 4.1 05/05/2018    CL 103 05/05/2018    CO2 31.0 (H) 05/05/2018    BUN 12 05/12/2018    CREATININE 0.76 05/12/2018    GLU 95 05/05/2018    CALCIUM 10.2 05/05/2018       Lab Results   Component Value Date    BILITOT 0.4 08/12/2015    PROT 6.8 08/12/2015    ALBUMIN 3.9 08/12/2015    ALT 22 08/12/2015    AST 19 08/12/2015    ALKPHOS 65  08/12/2015       Lab Results   Component Value Date    INR 1.10 05/12/2018    APTT 31.9 05/12/2018     From 04/06/18  T-bili 0.5, alk phos 61, AST 19, ALT 8. Albumin 4        IMAGING:  Reviewed results and personally reviewed images.  -03/04/18: CT A/P with IV contrast showed no evidence of metastatic disease in the abdomen and pelvis. Known rectal mass, consistent with known malignancy better assessed on concurrent pelvic MRI.  -03/04/18: CT Chest showed bilateral subcentimeter pulmonary nodules, indeterminate. Recommend short interval follow-up (3 months), given reported history of rectal cancer. Cardiomegaly.  -03/13/18: PET-CT showed no evidence of metabolically active nodal or distant metastatic disease. Sensitivity may be decreased in the large bowel due to metformin use; uptake in primary rectal cancer is not clearly visible over and above diffuse hypermetabolism due to metformin.  -03/22/18: MRI Pelvis showed low rectal tumor radiographically staged as T3c, Nx, Mx. No obvious evidence of metastatic disease in the lymph nodes. However, a few tiny indeterminate perirectal and superior rectal chain lymph nodes are noted. The shortest distance between the tumor and MRF is 1.4 mm but this is evaluation is mildly limited due to motion artifacts. Clear sphincter margin. EMVI present.    Madelin Headings, MD  [facility]  Gastrointestinal Medical Oncology

## 2018-06-19 ENCOUNTER — Ambulatory Visit: Admit: 2018-06-19 | Discharge: 2018-06-19 | Payer: MEDICARE

## 2018-06-19 ENCOUNTER — Ambulatory Visit
Admit: 2018-06-19 | Discharge: 2018-06-19 | Payer: MEDICARE | Attending: Hematology & Oncology | Primary: Hematology & Oncology

## 2018-06-19 ENCOUNTER — Ambulatory Visit
Admit: 2018-06-19 | Discharge: 2018-06-19 | Payer: MEDICARE | Attending: Radiation Oncology | Primary: Radiation Oncology

## 2018-06-19 ENCOUNTER — Inpatient Hospital Stay: Payer: Medicare Other | Admitting: Hematology and Oncology

## 2018-06-19 DIAGNOSIS — Z4889 Encounter for other specified surgical aftercare: Principal | ICD-10-CM

## 2018-06-19 DIAGNOSIS — C20 Malignant neoplasm of rectum: Principal | ICD-10-CM

## 2018-06-19 LAB — COMPREHENSIVE METABOLIC PANEL
ALKALINE PHOSPHATASE: 73 U/L (ref 38–126)
ALT (SGPT): <8 U/L — ABNORMAL LOW (ref 15–48)
ANION GAP: 9 mmol/L (ref 9–15)
AST (SGOT): 22 U/L (ref 14–38)
BILIRUBIN TOTAL: 0.4 mg/dL (ref 0.0–1.2)
BLOOD UREA NITROGEN: 29 mg/dL — ABNORMAL HIGH (ref 7–21)
BUN / CREAT RATIO: 39
CALCIUM: 10 mg/dL (ref 8.5–10.2)
CHLORIDE: 103 mmol/L (ref 98–107)
CO2: 29 mmol/L (ref 22.0–30.0)
CREATININE: 0.74 mg/dL (ref 0.60–1.00)
EGFR CKD-EPI AA FEMALE: 81 mL/min/{1.73_m2} (ref >=60)
EGFR CKD-EPI NON-AA FEMALE: 71 mL/min/{1.73_m2} (ref >=60)
GLUCOSE RANDOM: 98 mg/dL (ref 65–179)
POTASSIUM: 4.7 mmol/L (ref 3.5–5.0)
PROTEIN TOTAL: 6.7 g/dL (ref 6.5–8.3)
SODIUM: 141 mmol/L (ref 135–145)

## 2018-06-19 LAB — CBC W/ AUTO DIFF
BASOPHILS ABSOLUTE COUNT: 0.1 10*9/L (ref 0.0–0.1)
BASOPHILS RELATIVE PERCENT: 1.4 %
EOSINOPHILS ABSOLUTE COUNT: 0.3 10*9/L (ref 0.0–0.4)
EOSINOPHILS RELATIVE PERCENT: 3.8 %
HEMATOCRIT: 36.6 % (ref 36.0–46.0)
HEMOGLOBIN: 11.7 g/dL — ABNORMAL LOW (ref 12.0–16.0)
LARGE UNSTAINED CELLS: 3 % (ref 0–4)
LYMPHOCYTES RELATIVE PERCENT: 34.5 %
MEAN CORPUSCULAR HEMOGLOBIN CONC: 31.9 g/dL (ref 31.0–37.0)
MEAN CORPUSCULAR HEMOGLOBIN: 29.9 pg (ref 26.0–34.0)
MEAN CORPUSCULAR VOLUME: 93.5 fL (ref 80.0–100.0)
MEAN PLATELET VOLUME: 8.6 fL (ref 7.0–10.0)
MONOCYTES ABSOLUTE COUNT: 0.5 10*9/L (ref 0.2–0.8)
MONOCYTES RELATIVE PERCENT: 5.5 %
NEUTROPHILS RELATIVE PERCENT: 51.8 %
PLATELET COUNT: 304 10*9/L (ref 150–440)
RED BLOOD CELL COUNT: 3.91 10*12/L — ABNORMAL LOW (ref 4.00–5.20)
RED CELL DISTRIBUTION WIDTH: 14.3 % (ref 12.0–15.0)
WBC ADJUSTED: 8.9 10*9/L (ref 4.5–11.0)

## 2018-06-19 LAB — CARCINOEMBRYONIC ANTIGEN: Carcinoembryonic Ag:MCnc:Pt:Ser/Plas:Qn:: 17.9 — ABNORMAL HIGH

## 2018-06-19 LAB — BLOOD UREA NITROGEN: Urea nitrogen:MCnc:Pt:Ser/Plas:Qn:: 29 — ABNORMAL HIGH

## 2018-06-19 LAB — PLATELET COUNT: Lab: 304

## 2018-06-19 MED ORDER — CAPECITABINE 500 MG TABLET: 1000 mg/m2 | tablet | Freq: Two times a day (BID) | 6 refills | 0 days | Status: AC

## 2018-06-19 MED ORDER — PROCHLORPERAZINE MALEATE 10 MG TABLET
ORAL_TABLET | Freq: Four times a day (QID) | ORAL | 3 refills | 0.00000 days | Status: CP | PRN
Start: 2018-06-19 — End: 2019-03-26

## 2018-06-19 MED ORDER — CAPECITABINE 500 MG TABLET
ORAL_TABLET | Freq: Two times a day (BID) | ORAL | 6 refills | 0.00000 days | Status: CP
Start: 2018-06-19 — End: 2019-03-26
  Filled 2018-07-07: qty 70, 21d supply, fill #0

## 2018-06-19 NOTE — Unmapped (Addendum)
PLAN FROM TODAY:    Diagnosis: Stage III rectal cancer    We discussed treatment options. Ultimately we decided to proceed with capecitabine (Xeloda) for 6 months.  You will take tablets by mouth twice a day for 14 days, followed by 7 days off.  The tentative plan will be to start the last week of Sept, but we will follow up with you after your visit with Dr. Elenore Rota on 9/24 to ensure your wound is healing well.         ADDITIONAL INSTRUCTIONS:  In addition to the plan outlined above, please call us if you experience:   1. Nausea or vomiting not controlled by nausea medicines  2. Diarrhea with more then 4 bowel movements a day not controlled by immodium  3. Fever of 100.4 F or higher   4. Uncontrolled pain  5. Any other concerning symptom    For health related questions call: the oncology nurse triage phone line at [phone]  For appointment changes call: Main Clinic [phone]  After hours call [facility] and ask for the Oncology Fellow on call: [phone]      Milana Huntsman, MD  GI Medical Oncology  [facility] Hematology/Oncology  [address]  [place], [place] [address]

## 2018-06-19 NOTE — Unmapped (Signed)
GI SURGERY CLINIC      Patient Name: Renee Houston  Medical Record Number: [id number]  Date of Service: 06/19/2018  Attending Provider: Jacquelyne Balint, ANP    Referring Physician:   Unknown Per Patient Referring  No address on file    Primary Care Provider:   No PCP Per Patient    CONSULTING PHYSICIANS:  Patient Care Team:  None Per Patient Pcp as PCP - General (Family Medicine)      Reason for visit: wound check   Diagnosis/History:   05/11/18 Robotic APR w/end colostomy for pT3pN2 rectal cancer        ASSESSMENT: 84 y.o. female almost 6 weeks out from APR. Wound is healthy. No surgical issues today    PLAN:    --appointment today with Medical and Radiation Oncology  --follow up with Dr Elenore Rota in 2 weeks for wound check        HPI: Renee Houston is a 84 y.o. female s/p??robotic APR on 7/29??For pT3pN2 rectal cancer. Discharged home on 8/4 with an uneventful hospital course.??Postoperatively she developed two areas of wound dehissence that have been followed weekly in clinic. She is here today for a follow up wound check.  FOLLOW UP NOTE:  Drainage has decreased, only small amount of serosanguinous fluid on gauze. Supplementing diet with protein drink. Appetite OK. Discomfort with sitting is decreasing.     ALLERGIES: Ampicillin; Penicillins; Hydrocodone-homatropine; Prednisone; Amoxicillin; Citalopram; and Tramadol    MEDICATIONS:  Outpatient Encounter Medications as of 06/19/2018   Medication Sig Dispense Refill   ??? acetaminophen (TYLENOL) 500 MG tablet Take 1,000 mg by mouth every four (4) hours as needed for pain.     ??? amLODIPine (NORVASC) 2.5 MG tablet Take 2.5 mg by mouth nightly.      ??? blood sugar diagnostic (ONETOUCH ULTRA BLUE TEST STRIP) Strp USE EVERY DAY     ??? butalbital-acetaminophen-caffeine (FIORICET, ESGIC) 50-325-40 mg per tablet Take 1 tablet by mouth every four (4) hours as needed.      ??? cholecalciferol, vitamin D3, 1,000 unit tablet Take 1,000 Units by mouth daily.      ??? cyanocobalamin 1000 MCG tablet Take 1,000 mcg by mouth daily.      ??? doxepin (SINEQUAN) 10 MG capsule Take 10 mg by mouth nightly.     ??? ferrous sulfate 325 (65 FE) MG tablet Take 325 mg by mouth daily.      ??? metFORMIN (GLUCOPHAGE) 500 MG tablet Take 500 mg by mouth daily.      ??? ondansetron (ZOFRAN) 4 MG tablet TAKE 1 TABLET BY MOUTH EVERY 8 HOURS AS NEEDED FOR NAUSEA 15 each 0   ??? ondansetron (ZOFRAN-ODT) 4 MG disintegrating tablet      ??? telmisartan (MICARDIS) 40 MG tablet Take 40 mg by mouth daily.     ??? UNABLE TO FIND Juice plus(powder)-supplement taken daily with liquid     ??? [EXPIRED] oxyCODONE (ROXICODONE) 5 MG immediate release tablet Take 0.5 tablets (2.5 mg total) by mouth every four (4) hours as needed for pain. for up to 5 days 10 tablet 0   ??? [DISCONTINUED] oxyCODONE (OXY-IR) 5 mg capsule 1/2 tablet every 8 hours as needed for pain (Patient not taking: Reported on 06/19/2018) 10 capsule 0     No facility-administered encounter medications on file as of 06/19/2018.        PHYSICAL EXAM  Vital signs: BP 134/72  - Pulse 79  - Temp 36.3 ??C (97.3 ??F) (Oral)  -  Ht 152.4 cm (5')  - Wt 57.5 kg (126 lb 12.2 oz)  - BMI 24.76 kg/m??    Wt Readings from Last 3 Encounters:   06/19/18 57.5 kg (126 lb 12.2 oz)   06/10/18 57.3 kg (126 lb 5.2 oz)   06/03/18 56.4 kg (124 lb 7 oz)     General: 84 y.o. female who appears stated age, in no apparent distress.   HEENT: EOMI. Sclera are anicteric.    Cardiovascular: Regular rate, rhythm.    Lungs: NWOB on RA.    Abdomen: Soft, NT, ND.  Appliance intact   Perineum: Superior wound healthy, ~2 cm depth scant amount of drainage on gauze, small opening ~1 cm below, no drainage,more anterior wound shallow, healthy- silver nitrate applied   Extremities: No peripheral edema noted.      DIAGNOSTIC STUDIES:   none

## 2018-06-20 NOTE — Unmapped (Signed)
RADIATION ONCOLOGY INITIAL CONSULTATION NOTE    Encounter Date: 06/19/2018  Patient Name: Renee Houston  Medical Record Number: [id number]    Referring Physician: Reeves Forth, MD  [address]  [id number]  [place], [place] [address]    Primary Care Provider: No PCP Per Patient    ASSESSMENT:  Otherwise healthy 55F with pT3N2 rectal adenocarcinoma s/p robotic APR on 05/11/2018, with 4/24 nodes involved and extension into the perirectal fat 0.4 cm from the circumferential margin      RECOMMENDATIONS:  1. CANCER TREATMENT:  We discussed the role for radiation in the post-op setting with pT3N2 disease. In this context, addition of radiation, while considered standard of care, has improvements in local control up to ~10% and no improvement in overall survival. She would likely tolerate radiation well given her overall good health. The risks would potentially include fatigue, nausea, vomiting, urinary frequency/urgency, and long term risks of injury to bowel/bladder, and possible effects on wound healing, although overall likely well tolerated. Given her strong importance in quality of life, the relatively modest benefits, and the fact that she is already diverted and local recurrence is not likely to be very symptomatic, we did not recommend radiation. If she were strongly motivated to maximize tumor control at the cost of 5 weeks of daily treatment and other impacts in quality of life, she could get referred to Renee Houston in [facility] for consideration of radiation. At this point, we will defer to her decision and she will contact us if she elects for radiation.      INFORMED CONSENT:   Not obtained    REASON FOR CONSULTATION:  Renee Houston is a 84 y.o. female who is seen in consultation at the request of Renee Hai Charlotte Crumb, MD for an opinion regarding the use of radiation therapy in the treatment of her rectal cancer.    DIAGNOSIS:  1. Rectal cancer (CMS-HCC)      Cancer Staging Malignant neoplasm of rectum (CMS-HCC)  Staging form: Colon and Rectum, AJCC 8th Edition  - Clinical: Stage IIA (cT3, cN0, cM0) - Unsigned  - Pathologic: pT3, pN2 - Unsigned      HISTORY OF PRESENT ILLNESS:  Information pertinent to today's evaluation is obtained via review of old/outside medical records, discussion at multidisciplinary tumor board and discussion with patient and/or family, and include the following:    ? Initially presented with rectal bleeding  ? 03/04/2018: Flexible sigmoidoscopy revealed an ulcerated non-obstructing large mass in the rectum. The mass was partially circumferential involving 1/2 of the lumen circumference. Mass was 5 cm in length. Biopsies confirmed well to moderately differentiated adenocarcinoma  ? 03/06/2018: Colonoscopy revealed a 7 mm polyp in the hepatic flexure (tubular adenoma), a 4 mm polyp in the sigmoid colon (tubular adenoma), thickened folds in the cecum (early hyperplastic polyp), and the known rectal carcinoma  ? 03/04/2018: CT C/A/P revealed eccentric mural thickening along the posterior wall of the rectum which may correspond with the reported rectal carcinoma. There was no local or definite metastatic disease. There was a 5 mm hypodensity adjacent to the gallbladder fossa too small to further characterize, felt to represent a small cyst but can be further correlated with dedicated liver protocol CT or MRI. There were bilateral renal cysts.  ? 03/07/2018: Pelvic MRI on 03/07/2018 revealed T2/early T3N0 rectal adenocarcinoma. Tumor was 6.5 cm from anal verge and 2.3 cm from anal sphincter. Tumor was 3.2 x 1.6 cm. There was extension through muscularis propria. There  was no well-defined extension beyond the muscularis propria. There was no extramural vascular invasion/thrombus, invasion of anterior peritoneal reflection of involvement of adjacent organs or pelvic sidewall structures. Mesorectal lymph nodes or extra-mesorectal lymphadenopathy was absent.  ?  PET scan revealed hypermetabolic rectal neoplasm (SUV 1.61), but no locoregional adenopathy or metastatic disease.  ? 05/11/2018: Renee Houston performed robotic APR for cT2-3N0 low rectal adenocarcinoma, with pathology revealing moderately differentiated adenocarcinoma invading the muscularis propria into adjacent fat and 0.4 cm from the circumferential margin, 4/24 lymph nodes involved. Margins negative, closest 4 mm to radial margin. pT3N2. Microsatellite stable    Today she is overall doing well, although with a small persistent perineal open wound, gradually healing, and following with Renee Houston. She is growing accustomed to the ostomy, and has no issues at the moment. She is eating well, functioning well at home, and otherwise not limited in activities.    I have reviewed old/outside medical records (please see above for summary).    REVIEW OF SYSTEMS:    A comprehensive review of 14 systems was negative except for pertinent positives noted in HPI.    Past Medical History:   Diagnosis Date   ??? Diabetes mellitus (CMS-HCC)    ??? Generalized headaches    ??? Hard to intubate    ??? Hypertension       Prior Radiation Therapy: no  Pacemaker: no  Pregnancy status: Negative pregnancy test/infertile  Collagen Vascular Disease: no    Past Surgical History:   Procedure Laterality Date   ??? APPENDECTOMY     ??? JOINT REPLACEMENT     ??? PR OMENTAL FLAP,INTRA-ABDOMINAL N/A 05/11/2018    Procedure: Robotic Omental Flap;  Surgeon: Renee Asper, MD;  Location: MAIN OR [facility];  Service: Gastrointestinal   ??? PR PROCTECTOMY,AP RESECT+OSTOMY N/A 05/11/2018    Procedure: ROBOTIC XI PROCTECTOMY; COMPLT-ABDOMINOPERINEAL W/COLOSTOMY;  Surgeon: Renee Asper, MD;  Location: MAIN OR [place];  Service: Gastrointestinal   ??? TONSILLECTOMY          No family history on file.     Social History     Occupational History   ??? Not on file   Tobacco Use   ??? Smoking status: Never Smoker   ??? Smokeless tobacco: Never Used   Substance and Sexual Activity   ??? Alcohol use: No   ??? Drug use: Not on file   ??? Sexual activity: Not on file       ALLERGIES/MEDICATIONS:  Reviewed in EPIC    PHYSICAL EXAM:     Vital Signs for this encounter:  BSA: There is no height or weight on file to calculate BSA.  There were no vitals taken for this visit.  Karnofsky/Lansky Performance Status: 80, Normal activity with effort; some signs or symptoms of disease (ECOG equivalent 1)  General:  Well Developed, Well Nourished,  No acute distress  Psychiatric:  Normal mood and affect.  Converses clearly and emotionally appropriate.   Eyes: Extra occular movements intact.   Ears/Nose/Mouth/Throat: Moist mucous membranes.   Respiratory: Normal work of breathing  Cardiovascular: RRR, no m/r/g  Gastrointestinal: Non distended, ostomy in place  Neuro: CN II-XII intact grossly, AAOx3  Skin:  Visualized portions of skin overlying the torso and back revealed no appreciable abnormality.     RADIOLOGY:  Imaging was personally reviewed as detailed in the history (see above).      PATHOLOGY:   Pathology was personally reviewed as detailed in the HPI.     LABS:  Personally reviewed and as documented in HPI.       Mikael Spray, MD  Radiation Oncology, PGY-5  Pager: [phone]  06/19/2018  5:23 PM

## 2018-06-22 NOTE — Unmapped (Signed)
Capecitabine delivery request submitted to [facility] for delivery date of 07/02/18 (this is theThursday prior to anticipated start date of Tuesday 07/07/18).    See below for details of the request:    [facility] Scheduling Note    Specialty Medication(s) to be Shipped (name and strength):   Hematology/Oncology: Xeloda 500 mg    Other medication(s) to be shipped: N/A    Renee Houston, DOB: 1924-01-02  Phone: [phone] (home)   Shipping Address: [address]  [place] [address]    Date Patient Should Receive Medication: 07/02/18    Delivery notes:    Signature not required   UPS    Care coordination: 5 minutes    Konrad Penta, PharmD, BCOP, CPP  Clinical Pharmacist Practitioner, Gastrointestinal Oncology  Pager: [phone]

## 2018-06-22 NOTE — Unmapped (Signed)
[facility] Medication Referral: No PA required    Medication (Brand/Generic): Capecitabine 500mg     Initial Benefits Investigation Claim completed with resulted information below:  No PA required  Patient ABLE to fill at [facility]  Insurance Company:  [employer]  Anticipated Copay: $50    As Co-pay is under $25 defined limit, per policy there will be no further investigation of need for financial assistance at this time unless patient requests. This referral has been communicated to the provider and handed off to the [facility] team for further processing and filling of prescribed medication.   ______________________________________________________________________  Please utilize this referral for viewing purposes as it will serve as the central location for all relevant documentation and updates.

## 2018-06-22 NOTE — Unmapped (Signed)
[facility]) Pharmacy Onboarding    Ms.Boeh is a 84 y.o. female who was counseled on initiation of capecitabine. Patient's date of birth/HIPAA verified.    Diagnosis: rectal cancer    Related to appropriateness of therapy, the following patient information was reviewed: diagnosis, contraindications, precautions, comorbidities, and allergies. Patient will receive a Lexi-Comp drug information handout with shipment.    Patient Specific Needs     ?? Patient speaks english, an interpreter is not needed.  ?? Patient is able to read and understand education materials at a high school level or above.  ?? Patient does not have any cultural barriers.  ?? Patient has no cognitive or physical impairments.    Medication Acquisition     Anticipated copay discussed with patient: $50    MAP Involvement:  ?? Prior authorization: N/A  ?? Copay assistance: N/A  ?? Grant assistance: N/A    [facility] and Delivery Information     Patient will receive a medication information handout and a Herbalist with their shipment.    Patient informed that shipment will be delivered on 07/02/18 via UPS and patient will not have to sign for the package.    Delivery address verified:  [address]  [place] [address]    Reviewed the services that the [facility] will provide and how to contact them [phone] option 4).  A representative from the pharmacy will contact the patient to remind them to set up their refill 7-10 days prior to the patient running out of medication. Emphasized the importance of answering or returning phone calls from the [facility] to prevent delays in therapy.  The pharmacy MUST speak to the patient to schedule the refill.    Patient informed that a pharmacist is available Monday through Friday 8:30 am to 4:30 pm. A pharmacist is available via pager 24/7 to answer any clinical questions regarding the Medication.    Medication Education     Please see progress note from 06/19/18 for medication-specific counseling session.    Konrad Penta, PharmD, BCOP, CPP  Clinical Pharmacist Practitioner, Gastrointestinal Oncology  Pager: [phone]

## 2018-06-22 NOTE — Unmapped (Signed)
Pharmacist Capecitabine Education     Renee Houston is a 84 y.o. female with rectal cancer who was counseled on the use of capecitabine.    Dose and schedule discussed:  1000 mg PO qAM and 1500 mg PO qPM for 14 days on followed by a 7-day rest period  Take with water within 30 minutes after a meal at the same time for each scheduled dose.    Missed dose information discussed: If a dose is missed, do not take an extra dose or two doses at one time. Simply take your next dose at the regularly scheduled time and record any missed doses so our team is aware.    Side effects and warnings/precautions discussed (including but not limited to):     ?? Complications of myelosuppression  ?? Diarrhea  ?? Mucositis  ?? Hand-foot syndrome  ?? Liver dysfunction  ?? Nausea  ?? Fatigue  ?? Alopecia  ?? Fluid retention or swelling  ?? Reproductive precautions and concerns    A corresponding management plan for these adverse effects was also shared with the patient. Instructions on when to contact our care team were discussed with the patient.    Drug interactions discussed: Medication list reviewed in Epic. The patient was instructed to inform the care team before taking any new medications or supplements. Patient/daughter were counseled extensively on lack of regulation related to over the counter supplements and unknowns with how many of these supplements can interact with our cancer-directed therapy (i.e. Capecitabine). Patient intents to continue taking Juice Plus (Fruit and Vegetable blends) as well as Ultima Replenisher electrolyte supplement; both do contain antioxidants which may alter efficacy of our therapy. Patient/caregiver confirmed understanding.    Storage, handling, and disposal information discussed: Oral chemotherapy handling precautions reviewed. Store at room temperature, in a dry location, away from light. Keep out of reach of others including children and pets. Keep stored in originally provided packaging until the medication is ready to be taken. Do not throw away or flush unused medication down the toilet or sink. Return unused medication to [facility] or to a local take-back program for proper disposal.    Education handout provided from: [facility]-endorsed Oral Chemotherapy Education database    Adherence discussed: Stressed the importance of taking medication as prescribed and to contact provider if that changes at any time.    All of the patient's questions were answered and the patient confirmed understanding of how to take their medication.    Approximate time spent with patient: 30 minutes    Konrad Penta, PharmD, BCOP, CPP  Clinical Pharmacist Practitioner, Gastrointestinal Oncology  Pager: [phone]

## 2018-06-26 NOTE — Unmapped (Signed)
[facility] Triage Note     Patient: Renee Houston     Reason for call: Questions    Time call returned: 1302     Phone Assessment: Patient requested a copy of her AVS, as she misplaced hers. Will print and place in the mail.     Triage Recommendations: None     Patient Response: Verbalized understanding.     Outstanding tasks: None     Patient Pharmacy has been verified and primary pharmacy has been marked as preferred

## 2018-06-26 NOTE — Unmapped (Signed)
Hi,     Patient contacted the Communication Center requesting to speak with the care team of Renee Houston to discuss:    Left paper work regarding contact information for emergencies and AVS and would like to speak with the care team about care plan.     Please contact patient at [phone].    Thank you,   Drema Balzarine  [facility]   [phone]

## 2018-06-29 NOTE — Unmapped (Signed)
VM let to return call.

## 2018-06-29 NOTE — Unmapped (Signed)
Hi,     Phylis, daughter of the patient contacted the Communication Center requesting to speak with the care team of Raivyn Kabler to discuss:    Stated that she would like to speak with her mother's care team, she has some questions and concerns regarding her mother's diagnosis and the steps that they will be taking going forward.     Please contact Phylis at [phone] .    Thank you,   Jannette Spanner  [facility]   [phone]

## 2018-06-29 NOTE — Unmapped (Signed)
Hi,     Oletta Cohn contacted the Communication Center regarding the following:    - Missed triage call, wanted to follow up. Questions about diagnosis and prognosis.    Please contact Ms. Countryman at [phone].    Thanks in advance,    Kelli Hope  [facility]   [phone]

## 2018-06-30 NOTE — Unmapped (Signed)
[facility] Triage Note     Patient: Renee Houston     Reason for call: Questions    Time call returned: 0942     Phone Assessment: [name] was calling to ask a few questions, she stated that they had concerns about neuropathy. She stated that she read about neuropathy being a concern with this medication, and she is concerned because her mother already has neuropathy and this wasn't mentioned as a side effect by the pharmacist. She is also concerned because no one has addressed nutrition with her mother. She is concerned that the dose may be too high for her mother, and concerned about her age and getting chemotherapy. She also was asking if the cancer was metastatic, as no one specifically mentioned that she had metastatic cancer. She was wondering how they will know if the cancer is spreading or has spread. I let her know that it would be best to discuss these concerns with Dr. Nedra Hai at their visit next week. She requested that the NN return her call to answer some of her questions. I let her know that I would forward the message to the NN.     Triage Recommendations: Will discuss with care team.     Patient Response: Verbalized understanding.     Outstanding tasks: Please contact patients daughter to further discuss concerns. Please place nutrition consult, if appropriate.     Patient Pharmacy has been verified and primary pharmacy has been marked as preferred

## 2018-06-30 NOTE — Unmapped (Signed)
Contacted [facility] to put a hold on delivering capecitabine (originally set to deliver out tomorrow to arrive to patient on 07/02/18). Patient's daughter (caregiver Oletta Cohn) contacted our team with concerns regarding adverse effects, prognosis, and ongoing plan for evaluating therapy. Discussed with patient's caregiver and we will defer shipping medication until office visit on 07/07/18 occurs with our team to ensure everyone is on the same page.    Reviewed that dosing/therapy option was carefully selected taking into account patient's clinical status and organ function. Also, discussed with patient's daughter that her concern for peripheral neuropathy would be considered a rare adverse effect for single agent capecitabine and is certainly not something that would be expected. This concern was sparked when patient's daughter was looking over adverse effects through an Management consultant.    Patient's daughter expressed concern for Ms. Cuccia's nutrition. I spoke with her at office visit on 06/19/18 and offered nutrition consult but this was declined at that time. However, they would like to move forward with nutrition consult and I will ask our nurse navigator the best way to coordinate this with office visit on 07/07/18.    Patient's caregiver was appreciative of the call.    Care coordination: 30 minutes    Konrad Penta, PharmD, BCOP, CPP  Clinical Pharmacist Practitioner, Gastrointestinal Oncology  Pager: [phone]

## 2018-07-07 ENCOUNTER — Ambulatory Visit: Admit: 2018-07-07 | Discharge: 2018-07-07 | Payer: MEDICARE | Attending: Surgery | Primary: Surgery

## 2018-07-07 ENCOUNTER — Ambulatory Visit
Admit: 2018-07-07 | Discharge: 2018-07-07 | Payer: MEDICARE | Attending: Hematology & Oncology | Primary: Hematology & Oncology

## 2018-07-07 ENCOUNTER — Ambulatory Visit: Admit: 2018-07-07 | Discharge: 2018-07-07 | Payer: MEDICARE | Attending: Registered" | Primary: Registered"

## 2018-07-07 DIAGNOSIS — Z713 Dietary counseling and surveillance: Principal | ICD-10-CM

## 2018-07-07 DIAGNOSIS — C20 Malignant neoplasm of rectum: Principal | ICD-10-CM

## 2018-07-07 DIAGNOSIS — Z4889 Encounter for other specified surgical aftercare: Principal | ICD-10-CM

## 2018-07-07 MED FILL — CAPECITABINE 500 MG TABLET: 21 days supply | Qty: 70 | Fill #0 | Status: AC

## 2018-07-07 NOTE — Unmapped (Signed)
Sent request to [facility] to schedule delivery of capecitabine with arrival expected on 07/08/18 (details included below):    [facility] Scheduling Note    Specialty Medication(s) to be Shipped (name and strength):   Hematology/Oncology: Xeloda 500 mg    Other medication(s) to be shipped: N/A    Renee Houston, DOB: 07-Feb-1924  Phone: [phone] (home)   Shipping Address: [address]  [place] [address]    Date Patient Should Receive Medication: 07/08/18    Delivery notes:    Signature not required   UPS    Care coordination: 5 minutes    Konrad Penta, PharmD, BCOP, CPP  Clinical Pharmacist Practitioner, Gastrointestinal Oncology  Pager: [phone]

## 2018-07-07 NOTE — Unmapped (Signed)
GI SURGERY CLINIC      Patient Name: Renee Houston  Medical Record Number: [id number]  Date of Service: 07/07/2018  Attending Provider: Hilma Favors, MD    Referring Physician:   Unknown Per Patient Referring  No address on file    Primary Care Provider:   No PCP Per Patient    CONSULTING PHYSICIANS:  Patient Care Team:  None Per Patient Pcp as PCP - General (Family Medicine)  Margot Chimes, RN as Registered Nurse  Reeves Forth, MD (Hematology and Oncology)      Reason for visit: wound check   Diagnosis/History:   --pT3pN2 rectal adenocarcinoma s/p robotic APR on 05/11/2018, with 4/24 nodes involved and extension into the perirectal fat 0.4 cm from the circumferential margin      ASSESSMENT: 84 y.o. female has recovered well after APR 2 months ago. Managing ostomy without difficulty. Surgical wound is almost completely healed.       PLAN:    --discontinue wound packing  --continue oncologic surveillance with Dr Nedra Hai during chemo, can alternate visits with Dr Nedra Hai once chemo is complete  --ok to start chemotherapy, wound has healed      HPI: Renee Houston is a 84 y.o. female  s/p??robotic APR on 7/29??For pT3pN2 rectal cancer. Discharged home on 8/4 with an uneventful hospital course.??Postoperatively she developed two areas of wound dehissence that have been following in clinic.??Her energy levels are improving, she is eating well and she has gained 2 lbs since last visit 3 weeks ago. Perineal pain has decreased. She continues to have a small amount of drainage from perineal wound. HHN is packing wound.    She has a follow up with Medical Oncology today, plan is to start capecitabine if wound has healed. Radiation therapy was not recommended.       ALLERGIES: Ampicillin; Penicillins; Hydrocodone-homatropine; Prednisone; Amoxicillin; Citalopram; and Tramadol    MEDICATIONS:  Outpatient Encounter Medications as of 07/07/2018   Medication Sig Dispense Refill   ??? amLODIPine (NORVASC) 2.5 MG tablet Take 2.5 mg by mouth nightly.      ??? blood sugar diagnostic (ONETOUCH ULTRA BLUE TEST STRIP) Strp USE EVERY DAY     ??? doxepin (SINEQUAN) 10 MG capsule Take 10 mg by mouth nightly.     ??? metFORMIN (GLUCOPHAGE) 500 MG tablet Take 500 mg by mouth daily.      ??? telmisartan (MICARDIS) 40 MG tablet Take 40 mg by mouth daily.     ??? UNABLE TO FIND Juice plus(powder)-supplement taken daily with liquid     ??? acetaminophen (TYLENOL) 500 MG tablet Take 1,000 mg by mouth every eight (8) hours as needed for pain.      ??? capecitabine (XELODA) 500 MG tablet Take 2 tablets by mouth in the morning and 3 tablets by mouth in the evening for 14 days; and then take 7 days off. (Patient not taking: Reported on 07/07/2018) 70 tablet 6   ??? ondansetron (ZOFRAN) 4 MG tablet      ??? ondansetron (ZOFRAN-ODT) 4 MG disintegrating tablet 4 mg.      ??? prochlorperazine (COMPAZINE) 10 MG tablet Take 1 tablet (10 mg total) by mouth every six (6) hours as needed for nausea. (Patient not taking: Reported on 07/07/2018) 30 tablet 3     No facility-administered encounter medications on file as of 07/07/2018.        PHYSICAL EXAM  Vital signs: BP 152/66  - Pulse 72  - Temp 36.6 ??C (97.9 ??  F)  - Ht 152.4 cm (5')  - Wt 58.1 kg (128 lb)  - BMI 25.00 kg/m??    Wt Readings from Last 3 Encounters:   07/07/18 58.1 kg (128 lb)   06/19/18 57.5 kg (126 lb 12.2 oz)   06/19/18 57.5 kg (126 lb 12.2 oz)     General: 84 y.o. female who appears stated age, in no apparent distress.   HEENT: EOMI. Sclera are anicteric.    Cardiovascular: Regular rate, rhythm.    Lungs: NWOB on RA.    Abdomen: Soft, NT, ND.  LLQ with ostomy appliance intact   Perineum: Superior opening probes ~1cm, anterior opening has healed, silver nitrate applied to superior opening   Extremities: No peripheral edema noted.      DIAGNOSTIC STUDIES:   none     PATHOLOGY: none

## 2018-07-07 NOTE — Unmapped (Signed)
Nutrition Assessment - Outpatient  Medical Nutrition Therapy provided to: Patient and daughter  Counseling Type: Initial    Assessment:     Past Medical History:   Diagnosis Date   ??? Diabetes mellitus (CMS-HCC)    ??? Generalized headaches    ??? Hard to intubate    ??? Hypertension       Pt is a 84 y.o. with rectal adenocarcinoma s/p APR with end colostomy 05/11/18 who is planning to start Xeloda and requested RD consult.  Daughter reports pt is not eating very much.  Pt agrees and endorses early satiety since her surgery.  Diet recall reveals inadequate po intake, yet her weight is actually up 3% x 1 month.  Daughter has been buying pt convenience foods as pt hasn't been cooking much lately or she drops food off for her.  She also has been buying her Evolve protein shakes (160 kcals, 20g protein) but pt doesn't drink them consistently.  Today, we discussed small frequent meals, nutrient dense foods, and suggested easy to prepare options.  Daughter was concerned with pt eating too many sugary foods or processed sugars, but today we discussed the importance of pt eating anything rather than nothing.     Anthropometrics   Height: 60  Current Weight: 58.1 kg  BMI: 25-WDL for age (Goal BMI for ages >40yrs is 25-30)  Ideal Body Weight: 53.6-69.5 kg (for BMI 23-30)  Percent Ideal Body Weight: 100%    Weight history:  07/07/18 58.1 kg (128 lb)   06/03/18 56.4 kg (124 lb 7 oz)   05/05/18 57.8 kg (127 lb 6.8 oz)   04/07/18 57.2 kg (126 lb 3.2 oz)     Recent weight changes: 3% GAIN x 1 month      Nutrition History:   Allergies: NKFA  Home Diet and Appetite: Poor  Breakfast: Usually oatmeal, but yesterday had 2 eggs and 2 slices of bacon  Lunch: leftovers of 3 spinach dumplings   Dinner: couldn't remember  Beverages: water, occasionally an Evolve protein shake     Nutrition-Related Labs and Meds  Labs: no new labs  Meds: Xeloda, Compazine, Zofran    Nutrition-Focused Physical Findings   Physical Activity: none    Current Nutrition Impact Symptoms:  GI: none  Oral: none  Other: early satiety, poor appetite    Estimated nutritional needs   Energy: 1374 ([place] Jeor = REE X 1.3 AF x 1.15 SF)  Protein: 70-81g (1.2-1.4g/kg)  Fluids: 1 ml/kcal    Nutrition Diagnosis     NI 2.1 inadequate oral intake r/t early satiety as evidenced by diet recall.     Nutrition Intervention     Nutrition Goals  1. Maintain WT within 1% each week  2. Meet >75% energy and protein needs daily    Nutrition Prescription:    1. Small frequent meals of nutrient dense foods  2. Eat on a schedule, following time rather than waiting for hunger cues  3. Be sure to eat within 30 min of Xeloda     Materials Provided were:  High Calorie/High Protein foods/snacks  Eating Hints for Cancer booklet     Follow-up: as needed    Length of Visit:  20 minutes    Lattie Haw, MS, RD, CSO, LDN

## 2018-07-07 NOTE — Unmapped (Signed)
ASSESSMENT AND PLAN:  Renee Houston is a 84 yo F from [place], [place], who presents for follow-up of pT3N2 rectal adenocarcinoma. She is now s/p APR 05/11/18.    1. Stage IIIB rectal adenocarcinoma. MSS.   -Patient continues to recover well from surgery, and wound is now left to close without needing further packing.   -I did discuss that the CEA remains elevated, though lower then preoperative value. She hasn't had scans since May 2019. I will repeat CT chest/abdomen/pelvis postoperatively to establish a new baseline and ensure that there is no evidence of metastasis in the interim. If there is anything concerning, it would impact goals of therapy, but would not preclude use of capecitabine.  -After multidisciplinary discussion, and in light of her age and comorbidities but otherwise good health, we had decided to proceed with adjuvant capecitabine monotherapy. I reassured the patient and her daughter that the dose that was selected is adjusted for body weight and height, and further adjusted based on creatinine clearance (which takes into account age). We also discussed that single agent fluoropyrimidine is generally well tolerated and does not typically cause neuropathy (they had read about adjuvant chemotherapy on the Internet and likely read about side effects from FOLFOX).   -We again discussed side effects and toxicities of chemotherapy with capecitabine, including low Briner blood cell count, low red blood cell count, low platelets, increased risk of infection, increased risk of bleeding, anemia, fatigue, kidney injury, liver injury, nausea, vomiting, diarrhea, hand-foot syndrome, mouth sores, and heart injury. Patient again consented to chemotherapy with capecitabine today 07/07/18. Also asked our pharmacist to discuss and provide additional education and arrange for shipment of drug.  -Proceed with capecitabine (750 mg/m2 po bid given that we included 25% dose reduction given creatinine clearance <50) (thus comprised of 2 tablets po QAM and 3 tablets po QPM for 14 days; followed by 7 days OFF; with cycles q21days and plan for 6 cycles)  -Goal of therapy is curative as long as there are no findings on repeat scans.    2. Supportive  -Nausea: none at present. Prescribe prochlorperazine prn with chemo (patient notes ondansetron caused headache)  -Diarrhea: none at present  -No significant pain  -No significant emotional distress    3. DM  -Continue metformin    4. HTN  -Continue telmisartan    5. Hx of stroke in Oct 2018  -Patient reports she had been on baby ASA since the stroke, but this has been on hold since her diagnosis. We will hold ASA until the scans to ensure no procedures are needed in the near future. If scans remain negative, can plan to resume the baby ASA at that time.    6. Disposition  -Follow up with me 07/31/18 to review restaging scans, prior to C2 capecitabine      -----------------  ONCOLOGIC HISTORY:  -03/02/18: Admitted at [facility] with several weeks of rectal bleeding  -03/04/18: Flexible sigmoidoscopy found ulcerated non-obstructing rectal mass, partially circumferential and involving half of lumen circumference, 5 cm in length. Biopsy of rectum showed moderately differentiated invasive adenocarcinoma  -03/04/18: CT A/P with IV contrast showed no evidence of metastatic disease in the abdomen and pelvis. Known rectal mass, consistent with known malignancy better assessed on concurrent pelvic MRI.  -03/04/18: CT Chest showed bilateral subcentimeter pulmonary nodules, indeterminate. Recommend short interval follow-up (3 months), given reported history of rectal cancer. Cardiomegaly.  -03/05/18: CEA 30.3  -03/06/18: colonoscopy found 7 mm hepatic flexure polyp, 4 mm sigmoid polyp,  thickened folds in cecum, and known rectal carcinoma. Cecal polyp favored early hyperplastic polyp, negative for dysplasia and malignancy. Colon polyps x 3 from hepatic flexure and sigmoid showed 6 fragments of tubular adenoma, negative for high grade dysplasia and malignancy  -03/13/18: PET-CT showed no evidence of metabolically active nodal or distant metastatic disease. Sensitivity may be decreased in the large bowel due to metformin use; uptake in primary rectal cancer is not clearly visible over and above diffuse hypermetabolism due to metformin.  -03/22/18: MRI Pelvis showed low rectal tumor radiographically staged as T3c, Nx, Mx. No obvious evidence of metastatic disease in the lymph nodes. However, a few tiny indeterminate perirectal and superior rectal chain lymph nodes are noted. The shortest distance between the tumor and MRF is 1.4 mm but this is evaluation is mildly limited due to motion artifacts. Clear sphincter margin. EMVI present.  -7/29-05/17/18: Admitted for APR. Discharged home with home health  -05/11/18: Robotic APR and robotic omental flap, with end sigmoid colostomy. Pathology of rectum and anus showed 4.4 cm moderately differentiated adenocarcinoma with mucinous features, invading into adjacent fat. Margins negative. 4/24 lymph nodes positive for metastatic carcinoma, and 2 soft tissue deposits present. No LVI or pNI. pT3N2. IHC showed proficient mismatch repair, and MSS by PCR  -Postoperatively developed two areas of wound dehiscence in perineum requiring weekly clinic checks.      HISTORY OF PRESENT ILLNESS:  Renee Houston is a 84 yo F from [place], [place], who presents for follow-up of pT3N2 rectal adenocarcinoma, s/p APR 05/11/18. She has not yet started Xeloda, and her daughter has several questions about Xeloda. She presents for further discussion today.     She saw Dr. Elenore Rota today and they felt her wound continues to improve and she does not need to pack the wound anymore. She has had slow improvement in energy level. She is now able to do housework, though she hasn't been out walking much. However, she's been able to run errands with her daughter fine. She is able to walk without walker anymore, though if needs to stop and rest she will find a chair to rest in. She is getting closer to her pre-surgery level of energy.   She does note decreased appetite but hasn't lost weight, and has been drinking protein drinks every day. She continues with managing the ostomy - there is minor irritation but no diarrhea.    She is accompanied in clinic by her daughter.  Notably her granddaughter is getting married Dec 8 and she would like to feel well at that time.        REVIEW OF SYSTEMS:  Constitutional: No fevers or chills. Weight stable.  HEENT: No double vision or blurry vision.  RESP: No dyspnea or cough. No hemoptysis  CARDIAC: no chest pain or palpitations  GI: No nausea, vomiting, diarrhea, constipation, hematochezia, melena, dysphagia, or odynophagia. No abdominal pain.   GU: No dysuria or hematuria. +perineal wound healing gradually  MSK: No new bony or joint pains.  DERM: No rashes or changes in skin.  HEM/LYMPH: No easy bleeding or bruising, no enlarged lymph nodes  NEURO: No dizziness, weakness, numbness, or tingling.  Remainder of review of systems negative.  ECOG PS 1      PAST MEDICAL HISTORY:  Past Medical History:   Diagnosis Date   ??? Diabetes mellitus (CMS-HCC)    ??? Generalized headaches    ??? Hard to intubate    ??? Hypertension    Rectal cancer as described in  HPI  HTN  DM  Hx of mild stroke in Oct 2018 - reports having left arm weakness when she woke up. Says MRI did confirm stroke, but had near normalization of left hand function, and no limitation.  Hx of back surgery many years ago  Hx of bilateral knee replacements  Hx of bilateral cataract surgeries  Hx of appendectomy many years ago  Hx of tonsillectomy many years ago    FAMILY HISTORY:  An uncle had bone cancer  Another uncle had throat cancer    SOCIAL HISTORY:  Lives alone. Widowed. Has 1 daughter and 4 grandchildren. Retired; previously worked as a Diplomatic Services operational officer. Never smoked. No alcohol. No illicits.    ALLERGIES:  Allergies   Allergen Reactions   ??? Ampicillin Hives   ??? Penicillins Other (See Comments) and Hives     Other reaction  Other reaction  Has patient had a PCN reaction causing immediate rash, facial/tongue/throat swelling, SOB or lightheadedness with hypotension: Unknown  Has patient had a PCN reaction causing severe rash involving mucus membranes or skin necrosis: Unknown  Has patient had a PCN reaction that required hospitalization: Unknown  Has patient had a PCN reaction occurring within the last 10 years: No  If all of the above answers are NO, then may proceed with Cephalosporin use.   ??? Hydrocodone-Homatropine Other (See Comments)     tachycardia   ??? Prednisone Other (See Comments)     mind spins   ??? Amoxicillin Rash   ??? Citalopram Diarrhea   ??? Tramadol Itching       MEDICATIONS:    Current Outpatient Medications:   ???  acetaminophen (TYLENOL) 500 MG tablet, Take 1,000 mg by mouth every eight (8) hours as needed for pain. , Disp: , Rfl:   ???  amLODIPine (NORVASC) 2.5 MG tablet, Take 2.5 mg by mouth nightly. , Disp: , Rfl:   ???  blood sugar diagnostic (ONETOUCH ULTRA BLUE TEST STRIP) Strp, USE EVERY DAY, Disp: , Rfl:   ???  capecitabine (XELODA) 500 MG tablet, Take 2 tablets by mouth in the morning and 3 tablets by mouth in the evening for 14 days; and then take 7 days off., Disp: 70 tablet, Rfl: 6  ???  doxepin (SINEQUAN) 10 MG capsule, Take 10 mg by mouth nightly., Disp: , Rfl:   ???  metFORMIN (GLUCOPHAGE) 500 MG tablet, Take 500 mg by mouth daily. , Disp: , Rfl:   ???  ondansetron (ZOFRAN) 4 MG tablet, , Disp: , Rfl:   ???  ondansetron (ZOFRAN-ODT) 4 MG disintegrating tablet, 4 mg. , Disp: , Rfl:   ???  prochlorperazine (COMPAZINE) 10 MG tablet, Take 1 tablet (10 mg total) by mouth every six (6) hours as needed for nausea., Disp: 30 tablet, Rfl: 3  ???  telmisartan (MICARDIS) 40 MG tablet, Take 40 mg by mouth daily., Disp: , Rfl:   ???  UNABLE TO FIND, Juice plus(powder)-supplement taken daily with liquid, Disp: , Rfl:         PHYSICAL EXAM:  VITALS: BP 169/74  - Pulse 68  - Temp 36.6 ??C (97.9 ??F)  - Resp 18  - Wt 58.1 kg (128 lb)  - SpO2 99%  - BMI 25.00 kg/m??   GENERAL: well-built, well-appearing, in no acute distress  HEENT: Normocephalic, atraumatic. Sclerae anicteric. Conjunctivae without pallor. Oropharynx clear. Mucous membranes moist.  NECK: Supple. No palpable cervical or supraclavicular lymphadenopathy  RESP: Lungs clear to auscultation bilaterally.   CV: Regular rate and rhythm,  normal S1 and S2, no murmurs/rubs/gallops  BACK: Nontender on palpation over midline spinous processes.  ABD: Soft, mildly tender on palpation over abdomen diffusely but without rebound or guarding, nondistended, normoactive bowel sounds. No palpable masses or hepatosplenomegaly.   EXTREMITIES: Warm and well perfused. No peripheral edema. 2+ radial and DP pulses bilaterally  SKIN: No rashes noted. Exam of perineal wound deferred per patient request since it was redressed after being examined by Dr. Elenore Rota earlier today  PSYCH: Affect appropriate.  NEURO: Alert and oriented x3. Pupils equal, round, and reactive to light. Normal gait.      LABS:  Reviewed. Notably following labs:  Lab Results   Component Value Date    WBC 8.9 06/19/2018    HGB 11.7 (L) 06/19/2018    HCT 36.6 06/19/2018    PLT 304 06/19/2018       Lab Results   Component Value Date    NA 141 06/19/2018    K 4.7 06/19/2018    CL 103 06/19/2018    CO2 29.0 06/19/2018    BUN 29 (H) 06/19/2018    CREATININE 0.74 06/19/2018    GLU 98 06/19/2018    CALCIUM 10.0 06/19/2018       Lab Results   Component Value Date    BILITOT 0.4 06/19/2018    PROT 6.7 06/19/2018    ALBUMIN 3.9 06/19/2018    ALT <8 (L) 06/19/2018    AST 22 06/19/2018    ALKPHOS 73 06/19/2018       Lab Results   Component Value Date    INR 1.10 05/12/2018    APTT 31.9 05/12/2018     CEA 17.9 on 06/19/18        Madelin Headings, MD  [facility]  Gastrointestinal Medical Oncology

## 2018-07-07 NOTE — Unmapped (Signed)
PLAN FROM TODAY:    Diagnosis: Rectal cancer    We will plan to proceed with Xeloda pills. We will have it delivered later this week.  We will repeat CT chest, abdomen, and pelvis before your next visit with me.    We can hold on the baby aspirin until the scan is done, and tentatively plan to resume it after scans are done.      RECENT RESULTS:  No visits with results within 1 Day(s) from this visit.   Latest known visit with results is:   Office Visit on 06/19/2018   Component Date Value Ref Range Status   ??? Sodium 06/19/2018 141  135 - 145 mmol/L Final   ??? Potassium 06/19/2018 4.7  3.5 - 5.0 mmol/L Final   ??? Chloride 06/19/2018 103  98 - 107 mmol/L Final   ??? CO2 06/19/2018 29.0  22.0 - 30.0 mmol/L Final   ??? BUN 06/19/2018 29* 7 - 21 mg/dL Final   ??? Creatinine 06/19/2018 0.74  0.60 - 1.00 mg/dL Final   ??? BUN/Creatinine Ratio 06/19/2018 39   Final   ??? EGFR CKD-EPI Non-African American,* 06/19/2018 71  >=60 mL/min/1.15m2 Final   ??? EGFR CKD-EPI African American, Fem* 06/19/2018 81  >=60 mL/min/1.46m2 Final   ??? Glucose 06/19/2018 98  65 - 179 mg/dL Final   ??? Calcium 29/56/2130 10.0  8.5 - 10.2 mg/dL Final   ??? Albumin 86/57/8469 3.9  3.5 - 5.0 g/dL Final   ??? Total Protein 06/19/2018 6.7  6.5 - 8.3 g/dL Final   ??? Total Bilirubin 06/19/2018 0.4  0.0 - 1.2 mg/dL Final   ??? AST 62/95/2841 22  14 - 38 U/L Final   ??? ALT 06/19/2018 <8* 15 - 48 U/L Final   ??? Alkaline Phosphatase 06/19/2018 73  38 - 126 U/L Final   ??? Anion Gap 06/19/2018 9  9 - 15 mmol/L Final   ??? CEA 06/19/2018 17.9* 0.0 - 5.0 ng/mL Final   ??? WBC 06/19/2018 8.9  4.5 - 11.0 10*9/L Final   ??? RBC 06/19/2018 3.91* 4.00 - 5.20 10*12/L Final   ??? HGB 06/19/2018 11.7* 12.0 - 16.0 g/dL Final   ??? HCT 32/44/0102 36.6  36.0 - 46.0 % Final   ??? MCV 06/19/2018 93.5  80.0 - 100.0 fL Final   ??? MCH 06/19/2018 29.9  26.0 - 34.0 pg Final   ??? MCHC 06/19/2018 31.9  31.0 - 37.0 g/dL Final   ??? RDW 72/53/6644 14.3  12.0 - 15.0 % Final   ??? MPV 06/19/2018 8.6  7.0 - 10.0 fL Final   ??? Platelet 06/19/2018 304  150 - 440 10*9/L Final   ??? Neutrophils % 06/19/2018 51.8  % Final   ??? Lymphocytes % 06/19/2018 34.5  % Final   ??? Monocytes % 06/19/2018 5.5  % Final   ??? Eosinophils % 06/19/2018 3.8  % Final   ??? Basophils % 06/19/2018 1.4  % Final   ??? Absolute Neutrophils 06/19/2018 4.6  2.0 - 7.5 10*9/L Final   ??? Absolute Lymphocytes 06/19/2018 3.1  1.5 - 5.0 10*9/L Final   ??? Absolute Monocytes 06/19/2018 0.5  0.2 - 0.8 10*9/L Final   ??? Absolute Eosinophils 06/19/2018 0.3  0.0 - 0.4 10*9/L Final   ??? Absolute Basophils 06/19/2018 0.1  0.0 - 0.1 10*9/L Final   ??? Large Unstained Cells 06/19/2018 3  0 - 4 % Final   ??? Hypochromasia 06/19/2018 Slight* Not Present Final  ADDITIONAL INSTRUCTIONS:  In addition to the plan outlined above, please call us if you experience:   1. Nausea or vomiting not controlled by nausea medicines  2. Diarrhea with more then 4 bowel movements a day not controlled by immodium  3. Fever of 101.5 F or higher   4. Uncontrolled pain  5. Any other concerning symptom    For health related questions call: the oncology nurse triage phone line at [phone]  For appointment changes call: Main Clinic [phone]  After hours call [facility] and ask for the Oncology Fellow on call: [phone]      Milana Huntsman, MD  GI Medical Oncology  [facility] Hematology/Oncology  [address]  [place], [place] [address]

## 2018-07-09 NOTE — Unmapped (Signed)
[facility] Triage Note     Patient: Renee Houston     Reason for call: Medication    Time call returned: 0940     Phone Assessment: Left message for patient to return my call at [phone].     Triage Recommendations: None     Patient Response: N/A     Outstanding tasks: Await return call.     Patient Pharmacy has been verified and primary pharmacy has been marked as preferred

## 2018-07-09 NOTE — Unmapped (Signed)
Heloise Purpura, daughter of the patient, contacted the Communication Center requesting to speak with the care team of Joneisha Miles to discuss:    The patient has concerns about the instructions for taking her Xeloda Rx. The bottle says take the way Dr. Nedra Hai says take it but a paper came in the mail with different instructions.     Please contact Phyllis at [phone].    Thank you,   Drema Balzarine  [facility]   [phone]

## 2018-07-09 NOTE — Unmapped (Signed)
[facility] Triage Note     Patient: AmeLie Hollars     Reason for call:  Xeloda clarification     Time call returned: 0940     Phone Assessment: Patient's daughter called to clarify Xeloda dosage. She states that Janaisa told her she had a paper that said to say to take it differently. Her daughter thinks she may have read to take it in the morning and in the evening and was unable to find any paper that said differently. She just wanted to clarify. She also wants to make sure that if patient take it about 30 minutes earlier tomorrow that it is ok, she took her first dose a bit later this morning than she plans on taking it every day.      Triage Recommendations: Verified instructions to take 2 pills in AM and 3 pills in PM. Difference in 30 minutes should not be a problem but will verify and call back if different.      Patient Response: daughter verbalized understanding     Outstanding tasks: Any concerns with her taking it a little earlier tomorrow morning than she did today?      Patient Pharmacy has been verified and primary pharmacy has been marked as preferred

## 2018-07-09 NOTE — Unmapped (Signed)
NN returned daughters call. She has already spoken to triage. NN apologized for the delay in response. She should take 2 pills once in the morning and 3 pills once in the evening after a meal for 14 days on and 7 days off. Her daughter verbalized understanding and agreed.

## 2018-07-10 NOTE — Unmapped (Signed)
Spoke with Darvin Neighbours RN, they will continue to follow patient with [facility] nursing services and measure and monitor the perineal wound. Verbal order given to continue visits with nursing.

## 2018-07-23 NOTE — Unmapped (Addendum)
Per CPP request send out day before on 07/29/2018 Courier    [facility] Refill Coordination Note    Specialty Medication(s) to be Shipped:   Hematology/Oncology: Xeloda 500mg        Danny Lawless, DOB: 1923-11-29  Phone: [phone] (home)   Shipping Address: [address]  [place] [address]    All above HIPAA information was verified with patient.     Completed refill call assessment today to schedule patient's medication shipment from the [facility] [phone]).       Specialty medication(s) and dose(s) confirmed: Regimen is correct and unchanged.   Changes to medications: Deniss reports no changes reported at this time.  Changes to insurance: No  Questions for the pharmacist: No    The patient will receive a drug information handout for each medication shipped and additional FDA Medication Guides as required.      DISEASE/MEDICATION-SPECIFIC INFORMATION        N/A    ADHERENCE     Medication Adherence    Patient reported X missed doses in the last month:  0  Specialty Medication:  capecitabine (XELODA) 500 MG  Patient is on additional specialty medications:  No  Patient is on more than two specialty medications:  No  Any gaps in refill history greater than 2 weeks in the last 3 months:  no  Demonstrates understanding of importance of adherence:  yes  Informant:  patient  Reliability of informant:  reliable  Confirmed plan for next specialty medication refill:  delivery by pharmacy          Refill Coordination    Has the Patients' Contact Information Changed:  No  Is the Shipping Address Different:  No         MEDICARE PART B DOCUMENTATION     Capecitabine 500mg : Patient has 0 tablets on hand.    SHIPPING     Shipping address confirmed in Epic.     Delivery Scheduled: Yes, Expected medication delivery date: 07/29/2018 via UPS or courier. Courier  Sales promotion account executive   [facility] Specialty Technician

## 2018-07-28 MED FILL — CAPECITABINE 500 MG TABLET: 21 days supply | Qty: 70 | Fill #1 | Status: AC

## 2018-07-28 MED FILL — CAPECITABINE 500 MG TABLET: 21 days supply | Qty: 70 | Fill #1

## 2018-07-29 ENCOUNTER — Ambulatory Visit: Admit: 2018-07-29 | Discharge: 2018-07-29 | Payer: MEDICARE

## 2018-07-29 ENCOUNTER — Other Ambulatory Visit: Admit: 2018-07-29 | Discharge: 2018-07-29 | Payer: MEDICARE

## 2018-07-29 ENCOUNTER — Ambulatory Visit
Admit: 2018-07-29 | Discharge: 2018-07-29 | Payer: MEDICARE | Attending: Hematology & Oncology | Primary: Hematology & Oncology

## 2018-07-29 DIAGNOSIS — C20 Malignant neoplasm of rectum: Principal | ICD-10-CM

## 2018-07-29 LAB — COMPREHENSIVE METABOLIC PANEL
ALBUMIN: 3.9 g/dL (ref 3.5–5.0)
ALKALINE PHOSPHATASE: 63 U/L (ref 38–126)
ALT (SGPT): 12 U/L — ABNORMAL LOW (ref 15–48)
ANION GAP: 5 mmol/L — ABNORMAL LOW (ref 7–15)
AST (SGOT): 21 U/L (ref 14–38)
BILIRUBIN TOTAL: 0.5 mg/dL (ref 0.0–1.2)
BLOOD UREA NITROGEN: 18 mg/dL (ref 7–21)
BUN / CREAT RATIO: 21
CALCIUM: 9.3 mg/dL (ref 8.5–10.2)
CHLORIDE: 102 mmol/L (ref 98–107)
CREATININE: 0.86 mg/dL (ref 0.60–1.00)
EGFR CKD-EPI AA FEMALE: 68 mL/min/{1.73_m2} (ref >=60)
EGFR CKD-EPI NON-AA FEMALE: 59 mL/min/{1.73_m2} — ABNORMAL LOW (ref >=60)
GLUCOSE RANDOM: 108 mg/dL (ref 65–179)
POTASSIUM: 4.4 mmol/L (ref 3.5–5.0)
PROTEIN TOTAL: 6.5 g/dL (ref 6.5–8.3)
SODIUM: 138 mmol/L (ref 135–145)

## 2018-07-29 LAB — CBC W/ AUTO DIFF
BASOPHILS ABSOLUTE COUNT: 0.1 10*9/L (ref 0.0–0.1)
BASOPHILS RELATIVE PERCENT: 1.3 %
EOSINOPHILS ABSOLUTE COUNT: 0.2 10*9/L (ref 0.0–0.4)
EOSINOPHILS RELATIVE PERCENT: 3.1 %
HEMATOCRIT: 39.3 % (ref 36.0–46.0)
LARGE UNSTAINED CELLS: 2 % (ref 0–4)
LYMPHOCYTES ABSOLUTE COUNT: 2.3 10*9/L (ref 1.5–5.0)
LYMPHOCYTES RELATIVE PERCENT: 34.5 %
MEAN CORPUSCULAR HEMOGLOBIN CONC: 30.8 g/dL — ABNORMAL LOW (ref 31.0–37.0)
MEAN CORPUSCULAR HEMOGLOBIN: 29.1 pg (ref 26.0–34.0)
MEAN CORPUSCULAR VOLUME: 94.6 fL (ref 80.0–100.0)
MEAN PLATELET VOLUME: 8.4 fL (ref 7.0–10.0)
MONOCYTES ABSOLUTE COUNT: 0.4 10*9/L (ref 0.2–0.8)
NEUTROPHILS RELATIVE PERCENT: 52.5 %
PLATELET COUNT: 252 10*9/L (ref 150–440)
RED BLOOD CELL COUNT: 4.15 10*12/L (ref 4.00–5.20)
RED CELL DISTRIBUTION WIDTH: 17.5 % — ABNORMAL HIGH (ref 12.0–15.0)
WBC ADJUSTED: 6.6 10*9/L (ref 4.5–11.0)

## 2018-07-29 LAB — CARCINOEMBRYONIC ANTIGEN: Carcinoembryonic Ag:MCnc:Pt:Ser/Plas:Qn:: 23.8 — ABNORMAL HIGH

## 2018-07-29 LAB — HEMOGLOBIN: Lab: 12.1

## 2018-07-29 LAB — ALKALINE PHOSPHATASE: Alkaline phosphatase:CCnc:Pt:Ser/Plas:Qn:: 63

## 2018-07-29 NOTE — Unmapped (Signed)
Faxed [facility] one request with financial assistance application to referral testing [phone]    [phone]    Discuss rash on face and hands. Advised to use cetaphil and/or udder cream with urea. Can use hydrocortisone for itching. PharmD plans to f/u tomorrow regarding rash and dry mouth.

## 2018-07-29 NOTE — Unmapped (Signed)
Labs drawn and sent for analysis.  Care provided by  Sandria Manly, RN

## 2018-07-29 NOTE — Unmapped (Signed)
ASSESSMENT AND PLAN:  Renee Houston is a 84 yo F from [place], [place], who presents for follow-up of pT3N2 rectal adenocarcinoma. She is now s/p APR 05/11/18. However, postoperative imaging today is consistent with metastatic disease to lungs    1. Metastatic rectal adenocarcinoma. MSS. Initially diagnosed with stage IIIB disease  -We discussed that given CEA elevation, we had gotten repeat CT C/A/P done postoperatively. We discussed that this shows new and progressive lung nodules, and given this, I am convinced that this represents metastatic disease. I discussed while we could consider a biopsy, there would be risk in pursuing a biopsy, and I am already convinced that these nodules represent metastases; she agrees with not pursuing a biopsy. We discussed that given bilateral pulmonary nodules, this is not resectable, and mainstay of treatment is chemotherapy. Goals of treatment are to prolong life and maximize quality of life but realistically are not curative.  -Will continue with Xeloda for treatment of metastatic disease. However, there is now not an endpoint, and chemo will be continued as long as it is controlling the cancer and is tolerable. We could consider adding bevacizumab in the future, but given slow perineal wound healing, will wait longer before discussing.  -Will check [facility].  -Patient consented to chemotherapy with capecitabine 07/07/18.  -Proceed with capecitabine C2 (750 mg/m2 po bid given that we included 25% dose reduction given creatinine clearance <50) (thus comprised of 2 tablets po QAM and 3 tablets po QPM for 14 days; followed by 7 days OFF; with cycles q21days)    2. Supportive  -Nausea: none at present. Has prochlorperazine prn with chemo (patient notes ondansetron caused headache)  -Diarrhea: none at present  -Rash: Likely related to capecitabine, but overall mild.   -No significant pain  -Emotional: patient and her daughter are understandably upset at the news of metastasis but are appreciative of her care thus far.    3. DM  -Continue metformin    4. HTN  -Continue telmisartan    5. Hx of stroke in Oct 2018  -Patient reports she had been on baby ASA since the stroke, but this has been on hold since her diagnosis. Consider resuming ASA. Will need to consider cardiovascular risks of any antiangiogenic therapy before starting.    6. Disposition  -Follow up with me in 3 wks, prior to C3 capecitabine      -----------------  ONCOLOGIC HISTORY:  -03/02/18: Admitted at [facility] with several weeks of rectal bleeding  -03/04/18: Flexible sigmoidoscopy found ulcerated non-obstructing rectal mass, partially circumferential and involving half of lumen circumference, 5 cm in length. Biopsy of rectum showed moderately differentiated invasive adenocarcinoma  -03/04/18: CT A/P with IV contrast showed no evidence of metastatic disease in the abdomen and pelvis. Known rectal mass, consistent with known malignancy better assessed on concurrent pelvic MRI.  -03/04/18: CT Chest showed bilateral subcentimeter pulmonary nodules, indeterminate. Recommend short interval follow-up (3 months), given reported history of rectal cancer. Cardiomegaly.  -03/05/18: CEA 30.3  -03/06/18: colonoscopy found 7 mm hepatic flexure polyp, 4 mm sigmoid polyp, thickened folds in cecum, and known rectal carcinoma. Cecal polyp favored early hyperplastic polyp, negative for dysplasia and malignancy. Colon polyps x 3 from hepatic flexure and sigmoid showed 6 fragments of tubular adenoma, negative for high grade dysplasia and malignancy  -03/13/18: PET-CT showed no evidence of metabolically active nodal or distant metastatic disease. Sensitivity may be decreased in the large bowel due to metformin use; uptake in primary rectal cancer is not clearly visible  over and above diffuse hypermetabolism due to metformin.  -03/22/18: MRI Pelvis showed low rectal tumor radiographically staged as T3c, Nx, Mx. No obvious evidence of metastatic disease in the lymph nodes. However, a few tiny indeterminate perirectal and superior rectal chain lymph nodes are noted. The shortest distance between the tumor and MRF is 1.4 mm but this is evaluation is mildly limited due to motion artifacts. Clear sphincter margin. EMVI present.  -7/29-05/17/18: Admitted for APR. Discharged home with home health  -05/11/18: Robotic APR and robotic omental flap, with end sigmoid colostomy. Pathology of rectum and anus showed 4.4 cm moderately differentiated adenocarcinoma with mucinous features, invading into adjacent fat. Margins negative. 4/24 lymph nodes positive for metastatic carcinoma, and 2 soft tissue deposits present. No LVI or pNI. pT3N2. IHC showed proficient mismatch repair, and MSS by PCR  -Postoperatively developed two areas of wound dehiscence in perineum requiring weekly clinic checks.  -07/08/18: Started Xeloda. However, noted CEA to be elevated at 17.9  -08/07/18: CT C/A/P showed interval increase of size and number of bilateral pulmonary nodules concerning for progression of metastatic disease. Postsurgical changes following APR and left lower quadrant end ileostomy formation. Indeterminate subcentimeter hypodensity in the liver near the hepatic dome, too small to characterize, but not definitively seen on prior CT. Close attention on follow-up is recommended. Chronic pancreatitis.      HISTORY OF PRESENT ILLNESS:  Renee Houston is a 84 yo F from [place], [place], who presents for follow-up of pT3N2 rectal adenocarcinoma, s/p APR 05/11/18. She has completed one cycle of Xeloda. She has tolerated it OK. She has had some fatigue but remains active. She denies significant nausea, vomiting, or diarrhea. She denies hand-foot syndrome. She reports that in the off week she actually noted a rash. She denies significant mouth sores.     She is accompanied in clinic by her daughter.  Notably her granddaughter is getting married Dec 8 and she would like to feel well at that time.        REVIEW OF SYSTEMS:  Constitutional: No fevers or chills. Weight stable.  HEENT: No double vision or blurry vision.  RESP: No dyspnea or cough. No hemoptysis  CARDIAC: no chest pain or palpitations  GI: No nausea, vomiting, diarrhea, constipation, hematochezia, melena, dysphagia, or odynophagia. No abdominal pain.   GU: No dysuria or hematuria. +perineal wound healing  MSK: No new bony or joint pains.  DERM: +rash on face. No other changes in skin.  HEM/LYMPH: No easy bleeding or bruising, no enlarged lymph nodes  NEURO: No dizziness, weakness, numbness, or tingling.  Remainder of review of systems negative.  ECOG PS 1      PAST MEDICAL HISTORY:  Past Medical History:   Diagnosis Date   ??? Diabetes mellitus (CMS-HCC)    ??? Generalized headaches    ??? Hard to intubate    ??? Hypertension    Rectal cancer as described in HPI  HTN  DM  Hx of mild stroke in Oct 2018 - reports having left arm weakness when she woke up. Says MRI did confirm stroke, but had near normalization of left hand function, and no limitation.  Hx of back surgery many years ago  Hx of bilateral knee replacements  Hx of bilateral cataract surgeries  Hx of appendectomy many years ago  Hx of tonsillectomy many years ago    FAMILY HISTORY:  An uncle had bone cancer  Another uncle had throat cancer    SOCIAL HISTORY:  Lives alone. Widowed.  Has 1 daughter and 4 grandchildren. Retired; previously worked as a [employer] operational officer. Never smoked. No alcohol. No illicits.    ALLERGIES:  Allergies   Allergen Reactions   ??? Ampicillin Hives   ??? Penicillins Other (See Comments) and Hives     Other reaction  Other reaction  Has patient had a PCN reaction causing immediate rash, facial/tongue/throat swelling, SOB or lightheadedness with hypotension: Unknown  Has patient had a PCN reaction causing severe rash involving mucus membranes or skin necrosis: Unknown  Has patient had a PCN reaction that required hospitalization: Unknown  Has patient had a PCN reaction occurring within the last 10 years: No  If all of the above answers are NO, then may proceed with Cephalosporin use.   ??? Hydrocodone-Homatropine Other (See Comments)     tachycardia   ??? Prednisone Other (See Comments)     mind spins   ??? Amoxicillin Rash   ??? Citalopram Diarrhea   ??? Tramadol Itching       MEDICATIONS:    Current Outpatient Medications:   ???  acetaminophen (TYLENOL) 500 MG tablet, Take 1,000 mg by mouth every eight (8) hours as needed for pain. , Disp: , Rfl:   ???  amLODIPine (NORVASC) 2.5 MG tablet, Take 2.5 mg by mouth nightly. , Disp: , Rfl:   ???  blood sugar diagnostic (ONETOUCH ULTRA BLUE TEST STRIP) Strp, USE EVERY DAY, Disp: , Rfl:   ???  capecitabine (XELODA) 500 MG tablet, Take 2 tablets by mouth in the morning and 3 tablets by mouth in the evening for 14 days; and then take 7 days off., Disp: 70 tablet, Rfl: 6  ???  doxepin (SINEQUAN) 10 MG capsule, Take 10 mg by mouth nightly., Disp: , Rfl:   ???  metFORMIN (GLUCOPHAGE) 500 MG tablet, Take 500 mg by mouth daily. , Disp: , Rfl:   ???  ondansetron (ZOFRAN) 4 MG tablet, , Disp: , Rfl:   ???  ondansetron (ZOFRAN-ODT) 4 MG disintegrating tablet, 4 mg. , Disp: , Rfl:   ???  prochlorperazine (COMPAZINE) 10 MG tablet, Take 1 tablet (10 mg total) by mouth every six (6) hours as needed for nausea., Disp: 30 tablet, Rfl: 3  ???  telmisartan (MICARDIS) 40 MG tablet, Take 40 mg by mouth daily., Disp: , Rfl:   ???  UNABLE TO FIND, Juice plus(powder)-supplement taken daily with liquid, Disp: , Rfl:   No current facility-administered medications for this visit.         PHYSICAL EXAM:  VITALS: BP 161/66  - Pulse 75  - Temp 36.4 ??C (97.6 ??F) (Temporal)  - Resp 16  - Ht 152.4 cm (5')  - Wt 57.8 kg (127 lb 8 oz)  - SpO2 95%  - BMI 24.90 kg/m??   GENERAL: well-built, well-appearing, in no acute distress  HEENT: Normocephalic, atraumatic. Sclerae anicteric. Conjunctivae without pallor. Oropharynx clear. Mucous membranes moist.  NECK: Supple. No palpable cervical or supraclavicular lymphadenopathy  RESP: Lungs clear to auscultation bilaterally.   CV: Regular rate and rhythm, normal S1 and S2, no murmurs/rubs/gallops  BACK: Nontender on palpation over midline spinous processes.  ABD: Soft, nontender, nondistended, normoactive bowel sounds. No palpable masses or hepatosplenomegaly.   EXTREMITIES: Warm and well perfused. No peripheral edema. 2+ radial and DP pulses bilaterally  SKIN: Darkly erythematous macules. No other lesions, no erythema on hands or feet.  PSYCH: Affect appropriate.  NEURO: Alert and oriented x3. Pupils equal, round, and reactive to light. Normal gait.  LABS:  Reviewed. Notably following labs:  Lab Results   Component Value Date    WBC 6.6 07/29/2018    HGB 12.1 07/29/2018    HCT 39.3 07/29/2018    PLT 252 07/29/2018       Lab Results   Component Value Date    NA 138 07/29/2018    K 4.4 07/29/2018    CL 102 07/29/2018    CO2 31.0 (H) 07/29/2018    BUN 18 07/29/2018    CREATININE 0.86 07/29/2018    GLU 108 07/29/2018    CALCIUM 9.3 07/29/2018       Lab Results   Component Value Date    BILITOT 0.5 07/29/2018    PROT 6.5 07/29/2018    ALBUMIN 3.9 07/29/2018    ALT 12 (L) 07/29/2018    AST 21 07/29/2018    ALKPHOS 63 07/29/2018     CEA 23.8      IMAGING  Personally reviewed images  CT C/A/P 07/29/18  Interval increase of size and number of bilateral pulmonary nodules concerning for progression of metastatic disease.  Additional chronic and incidental findings, as above.    --Postsurgical changes following APR and left lower quadrant end ileostomy formation.  --Indeterminate subcentimeter hypodensity in the liver near the hepatic dome, too small to characterize, but not definitively seen on prior CT. Close attention on follow-up is recommended.  --Chronic pancreatitis.   --Please see separately dictated report for findings below the diaphragm.      Madelin Headings, MD  [facility]  Gastrointestinal Medical Oncology

## 2018-07-31 NOTE — Unmapped (Signed)
Pharmacy Telephone Follow-up    Spoke with Mrs. Renee Houston today to follow up on issues concerning xerostomia and facial rash. Patient reports drinking more water over the past two days and denies any issues with dry mouth. Recommended continuing to drink 8-10 glasses of water/day. Patients says the facial rash has greatly improved over the past two days. She has been applying moisturizer more frequently. Recommended using SPF30 when outside gardening as capecitabine can increase her sensitivity to sun.    Time spent on phone with patient: 5 minutes    Daleen Snook, PharmD  PGY2 Hematology/Oncology Pharmacy Resident  Pager: [phone]    I discussed the patient with the resident and agree with the history and plan as documented.    Konrad Penta, PharmD, BCOP, CPP  Clinical Pharmacist Practitioner, Gastrointestinal Oncology  Pager: [phone]

## 2018-07-31 NOTE — Unmapped (Signed)
Specialty Pharmacy Clinical Assessment     Specialty Product: capecitabine 500 mg tablets        Start date: 07/08/18    A/P:  (1) Renee Houston is 84 y.o. female with rectal carcinoma s/p 1 cycle of capecitabine therapy (1000 mg PO qAM and 1500 mg PO qPM for 14 days followed by 7 days off each 21-day cycle). Of note, she does now have newly metastatic disease. Based on given disease state, patient response, and current medical condition, it is appropriate to continue therapy at this time.    Adherence:  Missed doses: 0  Quantity on hand: 0 (shipment to be received on 10/17)    Toxicities:  Grade 1 rash on face   Grade 2 rash hand foot syndrome  Grade 1 diarrhea   Grade 2 fatigue   Grade 2 xerostomia    Medication Reconciliation and Drug-Drug Interactions: Medication list reviewed in Epic (see below). No clinically significant drug-drug interactions with capecitabine identified at this time. Patient informed to notify the team prior to starting any new prescription or over the counter medications or supplements. ??     Follow up: 11/5 clinic visit with Dr. Nedra Hai     ______________________________________________________________________    HPI:  ReneeHouston is a 84 y.o. female with rectal adenoma carcinoma who I saw regarding capecitabine. DOB and HIPAA information verified.     Interval History:   [name] presents to clinic today with her daughter. The daughter is expressly saddened by the recent news of her mother's new metastatic disease, however, [name] appears to be in good spirits. [name] reports new facial rash, back of hand rash, increased stool production in colostomy (4-5 bag changes a day), and mouth dryness that wakes her during sleep. She had heartburn last night and attributes it to eating a large brownie just before bed. She reports fatigue that has not limited her ability to perform ADLs. The daughter prefers natural products and wants to avoid chemicals; however, [name] has good cognitive ability and prioritizes medical advice over her daughter's internet readings. ??     BSA: 1.56 meters squared  BP 161/66  - Pulse 75  - Temp 36.4 ??C (97.6 ??F) (Temporal)  - Resp 16  - Ht 152.4 cm (5')  - Wt 57.8 kg (127 lb 8 oz)  - SpO2 95%  - BMI 24.90 kg/m??   Wt Readings from Last 3 Encounters:   07/29/18 57.8 kg (127 lb 8 oz)   07/07/18 58.1 kg (128 lb)   07/07/18 58.1 kg (128 lb)        No history exists.       Pertinent labs:  Lab Results   Component Value Date    WBC 6.6 07/29/2018    HGB 12.1 07/29/2018    HCT 39.3 07/29/2018    PLT 252 07/29/2018       Lab Results   Component Value Date    NA 138 07/29/2018    K 4.4 07/29/2018    CL 102 07/29/2018    CO2 31.0 (H) 07/29/2018    BUN 18 07/29/2018    CREATININE 0.86 07/29/2018    GLU 108 07/29/2018    CALCIUM 9.3 07/29/2018       Lab Results   Component Value Date    BILITOT 0.5 07/29/2018    PROT 6.5 07/29/2018    ALBUMIN 3.9 07/29/2018    ALT 12 (L) 07/29/2018    AST 21 07/29/2018    ALKPHOS 63  07/29/2018       Lab Results   Component Value Date    INR 1.10 05/12/2018    APTT 31.9 05/12/2018       Current medications:  Current Outpatient Medications   Medication Sig Dispense Refill   ??? amLODIPine (NORVASC) 2.5 MG tablet Take 2.5 mg by mouth nightly.      ??? metFORMIN (GLUCOPHAGE) 500 MG tablet Take 500 mg by mouth daily.      ??? telmisartan (MICARDIS) 40 MG tablet Take 40 mg by mouth daily.     ??? acetaminophen (TYLENOL) 500 MG tablet Take 1,000 mg by mouth every eight (8) hours as needed for pain.      ??? blood sugar diagnostic (ONETOUCH ULTRA BLUE TEST STRIP) Strp USE EVERY DAY     ??? capecitabine (XELODA) 500 MG tablet Take 2 tablets by mouth in the morning and 3 tablets by mouth in the evening for 14 days; and then take 7 days off. 70 tablet 6   ??? doxepin (SINEQUAN) 10 MG capsule Take 10 mg by mouth nightly.     ??? ondansetron (ZOFRAN-ODT) 4 MG disintegrating tablet 4 mg.      ??? prochlorperazine (COMPAZINE) 10 MG tablet Take 1 tablet (10 mg total) by mouth every six (6) hours as needed for nausea. 30 tablet 3   ??? UNABLE TO FIND Juice plus(powder)-supplement taken daily with liquid       No current facility-administered medications for this visit.      Coordination and/or care time spent with ReneeLimpert: 20 minutes    Daleen Snook, PharmD   PGY2 Hematology/Oncology Pharmacy Resident   Pager: [phone]     I discussed the patient with the resident and agree with the history and plan as documented.    Konrad Penta, PharmD, BCOP, CPP  Clinical Pharmacist Practitioner, Gastrointestinal Oncology  Pager: [phone]

## 2018-08-04 NOTE — Unmapped (Signed)
Geannie Risen contacted the Communication Center requesting to speak with the care team of Latavia Goga to discuss:    She took her Chemo pill last night at 10 instead of 9.  What time should she take it today?    Please contact at [phone].    Thank you,   Vernie Ammons  [facility]   [phone]

## 2018-08-04 NOTE — Unmapped (Signed)
AOC Triage Note     Patient: Renee Houston     Reason for call:  Xeloda question    Time call returned: 9:25 am     Phone Assessment: Pt reports she has been taking her Xeloda @ 9 am & 9 pm, but forgot to take her evening dose until 10 pm.  She wanted to know what time she should take her am dose.       Triage Recommendations:Since it was currently 9:26 am, I suggested she go ahead & take her morning dose now & then take her pm dose @ 9 pm like normal.  I reassured her that small alterations in time are alright & this should get her back on track.     Patient Response: Appreciative & will take today's am dose when she get's off the phone w/me.     Outstanding tasks: Care team notification no further actions needed      Patient Pharmacy has been verified and primary pharmacy has been marked as preferred

## 2018-08-06 NOTE — Unmapped (Signed)
Addended by: Ardine Eng on: 08/06/2018 12:59 PM     Modules accepted: Orders

## 2018-08-18 ENCOUNTER — Ambulatory Visit
Admit: 2018-08-18 | Discharge: 2018-08-19 | Payer: MEDICARE | Attending: Hematology & Oncology | Primary: Hematology & Oncology

## 2018-08-18 ENCOUNTER — Other Ambulatory Visit: Admit: 2018-08-18 | Discharge: 2018-08-19 | Payer: MEDICARE

## 2018-08-18 DIAGNOSIS — F411 Generalized anxiety disorder: Secondary | ICD-10-CM | POA: Insufficient documentation

## 2018-08-18 DIAGNOSIS — C7800 Secondary malignant neoplasm of unspecified lung: Secondary | ICD-10-CM | POA: Insufficient documentation

## 2018-08-18 DIAGNOSIS — C20 Malignant neoplasm of rectum: Principal | ICD-10-CM

## 2018-08-18 LAB — COMPREHENSIVE METABOLIC PANEL
ALBUMIN: 3.9 g/dL (ref 3.5–5.0)
ALT (SGPT): 7 U/L (ref <35)
AST (SGOT): 19 U/L (ref 14–38)
BILIRUBIN TOTAL: 0.4 mg/dL (ref 0.0–1.2)
BLOOD UREA NITROGEN: 33 mg/dL — ABNORMAL HIGH (ref 7–21)
BUN / CREAT RATIO: 31
CALCIUM: 9 mg/dL (ref 8.5–10.2)
CHLORIDE: 101 mmol/L (ref 98–107)
CO2: 30 mmol/L (ref 22.0–30.0)
CREATININE: 1.08 mg/dL — ABNORMAL HIGH (ref 0.60–1.00)
EGFR CKD-EPI AA FEMALE: 51 mL/min/{1.73_m2} — ABNORMAL LOW (ref >=60)
EGFR CKD-EPI NON-AA FEMALE: 45 mL/min/{1.73_m2} — ABNORMAL LOW (ref >=60)
GLUCOSE RANDOM: 136 mg/dL (ref 65–179)
POTASSIUM: 4.2 mmol/L (ref 3.5–5.0)
PROTEIN TOTAL: 6.4 g/dL — ABNORMAL LOW (ref 6.5–8.3)
SODIUM: 141 mmol/L (ref 135–145)

## 2018-08-18 LAB — CBC W/ AUTO DIFF
BASOPHILS ABSOLUTE COUNT: 0.1 10*9/L (ref 0.0–0.1)
BASOPHILS RELATIVE PERCENT: 1.7 %
EOSINOPHILS RELATIVE PERCENT: 3 %
HEMATOCRIT: 34.5 % — ABNORMAL LOW (ref 36.0–46.0)
HEMOGLOBIN: 11.3 g/dL — ABNORMAL LOW (ref 12.0–16.0)
LARGE UNSTAINED CELLS: 2 % (ref 0–4)
LYMPHOCYTES ABSOLUTE COUNT: 2.5 10*9/L (ref 1.5–5.0)
LYMPHOCYTES RELATIVE PERCENT: 31.6 %
MEAN CORPUSCULAR HEMOGLOBIN CONC: 32.7 g/dL (ref 31.0–37.0)
MEAN CORPUSCULAR HEMOGLOBIN: 31 pg (ref 26.0–34.0)
MEAN PLATELET VOLUME: 8.6 fL (ref 7.0–10.0)
MONOCYTES ABSOLUTE COUNT: 0.5 10*9/L (ref 0.2–0.8)
MONOCYTES RELATIVE PERCENT: 6 %
NEUTROPHILS ABSOLUTE COUNT: 4.4 10*9/L (ref 2.0–7.5)
NEUTROPHILS RELATIVE PERCENT: 55.6 %
PLATELET COUNT: 207 10*9/L (ref 150–440)
RED CELL DISTRIBUTION WIDTH: 20.1 % — ABNORMAL HIGH (ref 12.0–15.0)
WBC ADJUSTED: 7.9 10*9/L (ref 4.5–11.0)

## 2018-08-18 LAB — HEMOGLOBIN: Lab: 11.3 — ABNORMAL LOW

## 2018-08-18 LAB — PROTEIN TOTAL: Protein:MCnc:Pt:Ser/Plas:Qn:: 6.4 — ABNORMAL LOW

## 2018-08-18 NOTE — Unmapped (Signed)
Labs drawn and sent for analysis.  Care provided by  Y Cheek.

## 2018-08-18 NOTE — Unmapped (Addendum)
PLAN FROM TODAY:    Diagnosis: Rectal cancer    We will continue with Xeloda    -Take Xeloda Nov 7-20. The week off would be Nov 21-27  -To give extra time off before wedding, can continue to hold off on resuming the next cycle  -Would then plan to start on Dec 9-22      RECENT RESULTS:  Lab on 08/18/2018   Component Date Value Ref Range Status   ??? Sodium 08/18/2018 141  135 - 145 mmol/L Final   ??? Potassium 08/18/2018 4.2  3.5 - 5.0 mmol/L Final   ??? Chloride 08/18/2018 101  98 - 107 mmol/L Final   ??? CO2 08/18/2018 30.0  22.0 - 30.0 mmol/L Final   ??? BUN 08/18/2018 33* 7 - 21 mg/dL Final   ??? Creatinine 08/18/2018 1.08* 0.60 - 1.00 mg/dL Final   ??? BUN/Creatinine Ratio 08/18/2018 31   Final   ??? EGFR CKD-EPI Non-African American,* 08/18/2018 45* >=60 mL/min/1.62m2 Final   ??? EGFR CKD-EPI African American, Fem* 08/18/2018 51* >=60 mL/min/1.17m2 Final   ??? Glucose 08/18/2018 136  65 - 179 mg/dL Final   ??? Calcium 51/88/4166 9.0  8.5 - 10.2 mg/dL Final   ??? Albumin 04/12/1600 3.9  3.5 - 5.0 g/dL Final   ??? Total Protein 08/18/2018 6.4* 6.5 - 8.3 g/dL Final   ??? Total Bilirubin 08/18/2018 0.4  0.0 - 1.2 mg/dL Final   ??? AST 09/32/3557 19  14 - 38 U/L Final   ??? ALT 08/18/2018 7  <35 U/L Final   ??? Alkaline Phosphatase 08/18/2018 54  38 - 126 U/L Final   ??? Anion Gap 08/18/2018 10  7 - 15 mmol/L Final   ??? WBC 08/18/2018 7.9  4.5 - 11.0 10*9/L Final   ??? RBC 08/18/2018 3.63* 4.00 - 5.20 10*12/L Final   ??? HGB 08/18/2018 11.3* 12.0 - 16.0 g/dL Final   ??? HCT 32/20/2542 34.5* 36.0 - 46.0 % Final   ??? MCV 08/18/2018 95.0  80.0 - 100.0 fL Final   ??? MCH 08/18/2018 31.0  26.0 - 34.0 pg Final   ??? MCHC 08/18/2018 32.7  31.0 - 37.0 g/dL Final   ??? RDW 70/62/3762 20.1* 12.0 - 15.0 % Final   ??? MPV 08/18/2018 8.6  7.0 - 10.0 fL Final   ??? Platelet 08/18/2018 207  150 - 440 10*9/L Final   ??? Neutrophils % 08/18/2018 55.6  % Final   ??? Lymphocytes % 08/18/2018 31.6  % Final   ??? Monocytes % 08/18/2018 6.0  % Final   ??? Eosinophils % 08/18/2018 3.0  % Final   ??? Basophils % 08/18/2018 1.7  % Final   ??? Absolute Neutrophils 08/18/2018 4.4  2.0 - 7.5 10*9/L Final   ??? Absolute Lymphocytes 08/18/2018 2.5  1.5 - 5.0 10*9/L Final   ??? Absolute Monocytes 08/18/2018 0.5  0.2 - 0.8 10*9/L Final   ??? Absolute Eosinophils 08/18/2018 0.2  0.0 - 0.4 10*9/L Final   ??? Absolute Basophils 08/18/2018 0.1  0.0 - 0.1 10*9/L Final   ??? Large Unstained Cells 08/18/2018 2  0 - 4 % Final   ??? Macrocytosis 08/18/2018 Moderate* Not Present Final   ??? Anisocytosis 08/18/2018 Moderate* Not Present Final   ??? Hypochromasia 08/18/2018 Slight* Not Present Final       ADDITIONAL INSTRUCTIONS:  In addition to the plan outlined above, please call us if you experience:   1. Nausea or vomiting not controlled by nausea medicines  2. Diarrhea with  more then 4 bowel movements a day not controlled by immodium  3. Fever of 101.5 F or higher   4. Uncontrolled pain  5. Any other concerning symptom    For health related questions call: the oncology nurse triage phone line at [phone]  For appointment changes call: Main Clinic [phone]  After hours call [facility] Operator and ask for the Oncology Fellow on call: [phone]      Milana Huntsman, MD  GI Medical Oncology  [facility] Hematology/Oncology  [address], [address]  [place], [place] [address]

## 2018-08-18 NOTE — Unmapped (Signed)
ASSESSMENT AND PLAN:  Renee Houston is a 84 yo F from [place], [place], who presents for follow-up of pT3N2 rectal adenocarcinoma. She is now s/p APR 05/11/18. However, postoperative imaging today is consistent with metastatic disease to lungs    1. Metastatic rectal adenocarcinoma. MSS, TMB-Low (4 muts/Mb). KRAS G12D mutant.  Initially diagnosed with stage IIIB disease, but found to have lung metastases.  -We again discussed that given bilateral pulmonary nodules, disease is not amenable to locoregional therapy or surgery, and mainstay of treatment is chemotherapy. Though patient is concerned about the rash, on the whole she is tolerating treatment well so far, so will continue with Xeloda.  -We previously discussed that goals of treatment are to prolong life and maximize quality of life but realistically are not curative. They are planning to slowly discuss this news with other family members within the next couple of months.  -Will continue with Xeloda for treatment of metastatic disease. We could consider adding bevacizumab in the future, but given slow perineal wound healing, will wait longer before discussing.  -Patient consented to chemotherapy with capecitabine 07/07/18.  -Proceed with capecitabine C3 (750 mg/m2 po bid given that we included 25% dose reduction given creatinine clearance <50) (thus comprised of 2 tablets po QAM and 3 tablets po QPM for 14 days; followed by 7 days OFF; with cycles q21days)  -Foundation One showed KRAS G12D. MSS, TMB-Low (4 muts/Mb). Also BRAF S467L, SMAD4 D537G, and TP53 R175H. VUS include AKT2 A400S, CCNE1 G400S, RB1 amplification, SMAD4 F339L, and TEK amplification     2. Supportive  -Nausea: none at present. Has prochlorperazine prn with chemo (patient notes ondansetron caused headache)  -Diarrhea: none at present  -Rash: Likely related to capecitabine, but overall mild. Recommended using OTC hydrocortisone cream topically, and if still itchy can take antihistamine.  -No significant pain  -Emotional: patient and her daughter are understandably upset at the news of metastasis but are appreciative of her care thus far.    3. DM  -Continue metformin    4. HTN  -Continue telmisartan    5. Hx of stroke in Oct 2018  -Patient reports she had been on baby ASA since the stroke, but this has been on hold since her diagnosis. Will need to consider cardiovascular risks of any antiangiogenic therapy before starting.    6. Disposition  -Follow up with me before C4. She will take a short break for a couple of weeks before her granddaughter's wedding Dec 8, and will resume Xeloda and visit with [place] the week after the wedding      -----------------  ONCOLOGIC HISTORY:  -03/02/18: Admitted at [facility] with several weeks of rectal bleeding  -03/04/18: Flexible sigmoidoscopy found ulcerated non-obstructing rectal mass, partially circumferential and involving half of lumen circumference, 5 cm in length. Biopsy of rectum showed moderately differentiated invasive adenocarcinoma  -03/04/18: CT A/P with IV contrast showed no evidence of metastatic disease in the abdomen and pelvis. Known rectal mass, consistent with known malignancy better assessed on concurrent pelvic MRI.  -03/04/18: CT Chest showed bilateral subcentimeter pulmonary nodules, indeterminate. Recommend short interval follow-up (3 months), given reported history of rectal cancer. Cardiomegaly.  -03/05/18: CEA 30.3  -03/06/18: colonoscopy found 7 mm hepatic flexure polyp, 4 mm sigmoid polyp, thickened folds in cecum, and known rectal carcinoma. Cecal polyp favored early hyperplastic polyp, negative for dysplasia and malignancy. Colon polyps x 3 from hepatic flexure and sigmoid showed 6 fragments of tubular adenoma, negative for high grade dysplasia and malignancy  -  03/13/18: PET-CT showed no evidence of metabolically active nodal or distant metastatic disease. Sensitivity may be decreased in the large bowel due to metformin use; uptake in primary rectal cancer is not clearly visible over and above diffuse hypermetabolism due to metformin.  -03/22/18: MRI Pelvis showed low rectal tumor radiographically staged as T3c, Nx, Mx. No obvious evidence of metastatic disease in the lymph nodes. However, a few tiny indeterminate perirectal and superior rectal chain lymph nodes are noted. The shortest distance between the tumor and MRF is 1.4 mm but this is evaluation is mildly limited due to motion artifacts. Clear sphincter margin. EMVI present.  -7/29-05/17/18: Admitted for APR. Discharged home with home health  -05/11/18: Robotic APR and robotic omental flap, with end sigmoid colostomy. Pathology of rectum and anus showed 4.4 cm moderately differentiated adenocarcinoma with mucinous features, invading into adjacent fat. Margins negative. 4/24 lymph nodes positive for metastatic carcinoma, and 2 soft tissue deposits present. No LVI or pNI. pT3N2. IHC showed proficient mismatch repair, and MSS by PCR. Foundation One showed KRAS G12D. MSS, TMB-Low (4 muts/Mb). Also BRAF S467L, SMAD4 D537G, and TP53 R175H. VUS include AKT2 A400S, CCNE1 G400S, RB1 amplification, SMAD4 F339L, and TEK amplification   -Postoperatively developed two areas of wound dehiscence in perineum requiring weekly clinic checks.  -07/08/18: Started Xeloda. However, noted CEA to be elevated at 17.9  -08/07/18: CT C/A/P showed interval increase of size and number of bilateral pulmonary nodules concerning for progression of metastatic disease. Postsurgical changes following APR and left lower quadrant end ileostomy formation. Indeterminate subcentimeter hypodensity in the liver near the hepatic dome, too small to characterize, but not definitively seen on prior CT. Close attention on follow-up is recommended. Chronic pancreatitis.      HISTORY OF PRESENT ILLNESS:  Renee Houston is a 84 yo F from [place], [place], who presents for follow-up of pT3N2 rectal adenocarcinoma, s/p APR 05/11/18. She has completed 2 cycles of Xeloda and presents for follow-up. She notes having itchy skin rash with red spots on her hands and arms; she hasn't tried applying anything to it. Otherwise no significant nausea, vomiting, or diarrhea. She has had some modest fatigue but remains active. No hand-foot syndrome. No mouth sores. They did recently switch their primary care doctor and like their new doctor.    She is accompanied in clinic by her daughter.  Notably her granddaughter is getting married Dec 8 and she would like to feel well at that time. They are also struggling to figure out how to tell her grandchildren about her recent diagnosis of metastatic disease.        REVIEW OF SYSTEMS:  Constitutional: No fevers or chills. Weight stable.  HEENT: No double vision or blurry vision.  RESP: No dyspnea or cough. No hemoptysis  CARDIAC: no chest pain or palpitations  GI: No nausea, vomiting, diarrhea, constipation, hematochezia, melena, dysphagia, or odynophagia. No abdominal pain.   GU: No dysuria or hematuria.   MSK: No new bony or joint pains.  DERM: +itchy rash on hand and arms. No other changes in skin.  HEM/LYMPH: No easy bleeding or bruising, no enlarged lymph nodes  NEURO: No dizziness, weakness, numbness, or tingling.  Remainder of review of systems negative.  ECOG PS 1      PAST MEDICAL HISTORY:  Past Medical History:   Diagnosis Date   ??? Diabetes mellitus (CMS-HCC)    ??? Generalized headaches    ??? Hard to intubate    ??? Hypertension    Rectal  cancer as described in HPI  HTN  DM  Hx of mild stroke in Oct 2018 - reports having left arm weakness when she woke up. Says MRI did confirm stroke, but had near normalization of left hand function, and no limitation.  Hx of back surgery many years ago  Hx of bilateral knee replacements  Hx of bilateral cataract surgeries  Hx of appendectomy many years ago  Hx of tonsillectomy many years ago    FAMILY HISTORY:  An uncle had bone cancer  Another uncle had throat cancer    SOCIAL HISTORY:  Lives alone. Widowed. Has 1 daughter and 4 grandchildren. Retired; previously worked as a [employer] operational officer. Never smoked. No alcohol. No illicits.    ALLERGIES:  Allergies   Allergen Reactions   ??? Ampicillin Hives   ??? Penicillins Other (See Comments) and Hives     Other reaction  Other reaction  Has patient had a PCN reaction causing immediate rash, facial/tongue/throat swelling, SOB or lightheadedness with hypotension: Unknown  Has patient had a PCN reaction causing severe rash involving mucus membranes or skin necrosis: Unknown  Has patient had a PCN reaction that required hospitalization: Unknown  Has patient had a PCN reaction occurring within the last 10 years: No  If all of the above answers are NO, then may proceed with Cephalosporin use.   ??? Hydrocodone-Homatropine Other (See Comments)     tachycardia   ??? Prednisone Other (See Comments)     mind spins   ??? Amoxicillin Rash   ??? Citalopram Diarrhea   ??? Tramadol Itching       MEDICATIONS:    Current Outpatient Medications:   ???  acetaminophen (TYLENOL) 500 MG tablet, Take 1,000 mg by mouth every eight (8) hours as needed for pain. , Disp: , Rfl:   ???  amLODIPine (NORVASC) 2.5 MG tablet, Take 2.5 mg by mouth nightly. , Disp: , Rfl:   ???  blood sugar diagnostic (ONETOUCH ULTRA BLUE TEST STRIP) Strp, USE EVERY DAY, Disp: , Rfl:   ???  capecitabine (XELODA) 500 MG tablet, Take 2 tablets by mouth in the morning and 3 tablets by mouth in the evening for 14 days; and then take 7 days off., Disp: 70 tablet, Rfl: 6  ???  doxepin (SINEQUAN) 10 MG capsule, Take 10 mg by mouth nightly., Disp: , Rfl:   ???  metFORMIN (GLUCOPHAGE) 500 MG tablet, Take 500 mg by mouth daily. , Disp: , Rfl:   ???  ondansetron (ZOFRAN-ODT) 4 MG disintegrating tablet, 4 mg. , Disp: , Rfl:   ???  telmisartan (MICARDIS) 40 MG tablet, Take 40 mg by mouth daily., Disp: , Rfl:   ???  prochlorperazine (COMPAZINE) 10 MG tablet, Take 1 tablet (10 mg total) by mouth every six (6) hours as needed for nausea. (Patient not taking: Reported on 08/18/2018), Disp: 30 tablet, Rfl: 3  ???  UNABLE TO FIND, Juice plus(powder)-supplement taken daily with liquid, Disp: , Rfl:         PHYSICAL EXAM:  VITALS: BP 156/71  - Pulse 76  - Temp 36.8 ??C (98.2 ??F) (Oral)  - Wt 57.2 kg (126 lb 3.2 oz)  - SpO2 98%  - BMI 24.65 kg/m??   GENERAL: well-built, well-appearing, in no acute distress  HEENT: Normocephalic, atraumatic. Sclerae anicteric. Conjunctivae without pallor. Oropharynx clear. Mucous membranes moist.  NECK: Supple. No palpable cervical or supraclavicular lymphadenopathy  RESP: Lungs clear to auscultation bilaterally.   CV: Regular rate and rhythm, normal S1 and S2,  no murmurs/rubs/gallops  BACK: Nontender on palpation over midline spinous processes.  ABD: Soft, nontender, nondistended, normoactive bowel sounds. No palpable masses or hepatosplenomegaly.   EXTREMITIES: Warm and well perfused. No peripheral edema. 2+ radial and DP pulses bilaterally  SKIN: Darkly erythematous/brownish macules mostly on dorsum of hands and arms bilaterally. No other lesions, no erythema on palms.  PSYCH: Affect appropriate.  NEURO: Alert and oriented x3. Pupils equal, round, and reactive to light. Normal gait.      LABS:  Reviewed. Notably following labs:  Lab Results   Component Value Date    WBC 7.9 08/18/2018    HGB 11.3 (L) 08/18/2018    HCT 34.5 (L) 08/18/2018    PLT 207 08/18/2018       Lab Results   Component Value Date    NA 141 08/18/2018    K 4.2 08/18/2018    CL 101 08/18/2018    CO2 30.0 08/18/2018    BUN 33 (H) 08/18/2018    CREATININE 1.08 (H) 08/18/2018    GLU 136 08/18/2018    CALCIUM 9.0 08/18/2018       Lab Results   Component Value Date    BILITOT 0.4 08/18/2018    PROT 6.4 (L) 08/18/2018    ALBUMIN 3.9 08/18/2018    ALT 7 08/18/2018    AST 19 08/18/2018    ALKPHOS 54 08/18/2018     CEA 23.8          Madelin Headings, MD  [facility]  Gastrointestinal Medical Oncology

## 2018-08-20 NOTE — Unmapped (Signed)
[facility] Refill Coordination Note    Specialty Medication(s) to be Shipped:   Hematology/Oncology: Capecitabine 500mg        Renee Houston, DOB: May 24, 1924  Phone: [phone] (home)       All above HIPAA information was verified with patient.     Completed refill call assessment today to schedule patient's medication shipment from the [facility] [phone]).       Specialty medication(s) and dose(s) confirmed: Regimen is correct and unchanged.   Changes to medications: Renee Houston reports no changes reported at this time.  Changes to insurance: No  Questions for the pharmacist: No    The patient will receive a drug information handout for each medication shipped and additional FDA Medication Guides as required.      DISEASE/MEDICATION-SPECIFIC INFORMATION        N/A    ADHERENCE     Medication Adherence    Patient reported X missed doses in the last month:  0  Specialty Medication:  capecitabine 500mg   Informant:  patient  Reliability of informant:  reliable              Confirmed plan for next specialty medication refill:  delivery by pharmacy          Refill Coordination    Has the Patients' Contact Information Changed:  No  Is the Shipping Address Different:  No         MEDICARE PART B DOCUMENTATION     Capecitabine 500mg : Patient has none tablets on hand.    SHIPPING     Shipping address confirmed in Epic.     Delivery Scheduled: Yes, Expected medication delivery date: 08/21/2018 via UPS or courier.     Medication will be delivered via Same Day Courier to the home address in Epic [place].    Renee Houston   [facility] Specialty Technician

## 2018-08-20 NOTE — Unmapped (Signed)
Hi,    Patient Renee Houston called requesting a medication refill for the following: Need to start medication today.    ? Medication: xeloda  ? Dosage:   ? Days left of medication: 0  ? Pharmacy:     Program: GI  Speciality: Medical Oncology     The expected turnaround time is 3-4 business days     Thank you,  Christell Faith  [facility]  [phone]

## 2018-08-21 MED FILL — CAPECITABINE 500 MG TABLET: 21 days supply | Qty: 70 | Fill #2

## 2018-08-21 MED FILL — CAPECITABINE 500 MG TABLET: 21 days supply | Qty: 70 | Fill #2 | Status: AC

## 2018-09-02 NOTE — Unmapped (Signed)
Contacted patient by phone regarding follow up of ocular side effects possibly related to capecitabine. Renee Houston describes sensation of sand in her eyes and reduced clarity with vision; this can certainly be related to her capecitabine. She denies vision loss.    She went to her optometrist who prescribed a course of tobramycin drops (she was able to read the name of the medication to me). I recommended use of Refresh Plus (carboxymethylcellulose) 1-2 drops in each eye Q2H PRN during the day to provide relief of this dryness sensation.    Delivery of capecitabine for last cycle was delayed slightly, so she indicated she will finish the current cycle after evening dose tomorrow (09/03/18); currently taking capecitabine 1000 mg PO QAM and 1500 mg PO QPM on days 1 through 14 of each 21-day cycle.    Dr. Nedra Hai planning for treatment break given upcoming family member's wedding. Patient provided with instructions at last office visit to start next cycle on 09/21/18.    Care coordination: 20 minutes    Konrad Penta, PharmD, BCOP, CPP  Clinical Pharmacist Practitioner, Gastrointestinal Oncology  Pager: [phone]

## 2018-09-21 ENCOUNTER — Ambulatory Visit: Admit: 2018-09-21 | Discharge: 2018-09-22 | Payer: MEDICARE

## 2018-09-21 DIAGNOSIS — C20 Malignant neoplasm of rectum: Principal | ICD-10-CM

## 2018-09-21 LAB — COMPREHENSIVE METABOLIC PANEL
ALBUMIN: 4.2 g/dL (ref 3.5–5.0)
ALKALINE PHOSPHATASE: 68 U/L (ref 38–126)
ALT (SGPT): 11 U/L (ref <35)
ANION GAP: 9 mmol/L (ref 7–15)
AST (SGOT): 23 U/L (ref 14–38)
BILIRUBIN TOTAL: 0.7 mg/dL (ref 0.0–1.2)
BLOOD UREA NITROGEN: 19 mg/dL (ref 7–21)
BUN / CREAT RATIO: 24
CALCIUM: 9.5 mg/dL (ref 8.5–10.2)
CHLORIDE: 102 mmol/L (ref 98–107)
EGFR CKD-EPI AA FEMALE: 76 mL/min/{1.73_m2} (ref >=60)
EGFR CKD-EPI NON-AA FEMALE: 66 mL/min/{1.73_m2} (ref >=60)
GLUCOSE RANDOM: 148 mg/dL (ref 65–179)
POTASSIUM: 4 mmol/L (ref 3.5–5.0)
PROTEIN TOTAL: 6.6 g/dL (ref 6.5–8.3)
SODIUM: 140 mmol/L (ref 135–145)

## 2018-09-21 LAB — CBC W/ AUTO DIFF
BASOPHILS ABSOLUTE COUNT: 0.1 10*9/L (ref 0.0–0.1)
BASOPHILS RELATIVE PERCENT: 1.9 %
EOSINOPHILS ABSOLUTE COUNT: 0.2 10*9/L (ref 0.0–0.4)
EOSINOPHILS RELATIVE PERCENT: 2.5 %
HEMATOCRIT: 38.2 % (ref 36.0–46.0)
HEMOGLOBIN: 12.4 g/dL — ABNORMAL LOW (ref 13.5–16.0)
LARGE UNSTAINED CELLS: 4 % (ref 0–4)
LYMPHOCYTES ABSOLUTE COUNT: 2.7 10*9/L (ref 1.5–5.0)
LYMPHOCYTES RELATIVE PERCENT: 35.9 %
MEAN CORPUSCULAR HEMOGLOBIN CONC: 32.4 g/dL (ref 31.0–37.0)
MEAN CORPUSCULAR VOLUME: 100.3 fL — ABNORMAL HIGH (ref 80.0–100.0)
MEAN PLATELET VOLUME: 8.2 fL (ref 7.0–10.0)
MONOCYTES ABSOLUTE COUNT: 0.6 10*9/L (ref 0.2–0.8)
MONOCYTES RELATIVE PERCENT: 7.4 %
NEUTROPHILS ABSOLUTE COUNT: 3.6 10*9/L (ref 2.0–7.5)
PLATELET COUNT: 191 10*9/L (ref 150–440)
RED BLOOD CELL COUNT: 3.81 10*12/L — ABNORMAL LOW (ref 4.00–5.20)
RED CELL DISTRIBUTION WIDTH: 21.1 % — ABNORMAL HIGH (ref 12.0–15.0)
WBC ADJUSTED: 7.5 10*9/L (ref 4.5–11.0)

## 2018-09-21 LAB — CARCINOEMBRYONIC ANTIGEN: Carcinoembryonic Ag:MCnc:Pt:Ser/Plas:Qn:: 6.7 — ABNORMAL HIGH

## 2018-09-21 LAB — CREATININE: Creatinine:MCnc:Pt:Ser/Plas:Qn:: 0.78

## 2018-09-21 LAB — MEAN PLATELET VOLUME: Lab: 8.2

## 2018-09-21 LAB — SMEAR REVIEW

## 2018-09-30 ENCOUNTER — Ambulatory Visit
Admit: 2018-09-30 | Discharge: 2018-10-01 | Payer: MEDICARE | Attending: Hematology & Oncology | Primary: Hematology & Oncology

## 2018-09-30 DIAGNOSIS — C7800 Secondary malignant neoplasm of unspecified lung: Secondary | ICD-10-CM | POA: Insufficient documentation

## 2018-09-30 DIAGNOSIS — C20 Malignant neoplasm of rectum: Principal | ICD-10-CM

## 2018-09-30 NOTE — Unmapped (Addendum)
PLAN FROM TODAY:    Diagnosis: Rectal cancer  ??  We will continue with Xeloda  ??  We will plan to follow up in January with CT scans.      RECENT RESULTS:  No visits with results within 1 Day(s) from this visit.   Latest known visit with results is:   Appointment on 09/21/2018   Component Date Value Ref Range Status   ??? CEA 09/21/2018 6.7* 0.0 - 5.0 ng/mL Final   ??? Sodium 09/21/2018 140  135 - 145 mmol/L Final   ??? Potassium 09/21/2018 4.0  3.5 - 5.0 mmol/L Final   ??? Chloride 09/21/2018 102  98 - 107 mmol/L Final   ??? CO2 09/21/2018 29.0  22.0 - 30.0 mmol/L Final   ??? BUN 09/21/2018 19  7 - 21 mg/dL Final   ??? Creatinine 09/21/2018 0.78  0.60 - 1.00 mg/dL Final   ??? BUN/Creatinine Ratio 09/21/2018 24   Final   ??? EGFR CKD-EPI Non-African American,* 09/21/2018 66  >=60 mL/min/1.69m2 Final   ??? EGFR CKD-EPI African American, Fem* 09/21/2018 76  >=60 mL/min/1.72m2 Final   ??? Glucose 09/21/2018 148  65 - 179 mg/dL Final   ??? Calcium 16/07/9603 9.5  8.5 - 10.2 mg/dL Final   ??? Albumin 54/06/8118 4.2  3.5 - 5.0 g/dL Final   ??? Total Protein 09/21/2018 6.6  6.5 - 8.3 g/dL Final   ??? Total Bilirubin 09/21/2018 0.7  0.0 - 1.2 mg/dL Final   ??? AST 14/78/2956 23  14 - 38 U/L Final   ??? ALT 09/21/2018 11  <35 U/L Final   ??? Alkaline Phosphatase 09/21/2018 68  38 - 126 U/L Final   ??? Anion Gap 09/21/2018 9  7 - 15 mmol/L Final   ??? WBC 09/21/2018 7.5  4.5 - 11.0 10*9/L Final   ??? RBC 09/21/2018 3.81* 4.00 - 5.20 10*12/L Final   ??? HGB 09/21/2018 12.4* 13.5 - 16.0 g/dL Final   ??? HCT 21/30/8657 38.2  36.0 - 46.0 % Final   ??? MCV 09/21/2018 100.3* 80.0 - 100.0 fL Final   ??? MCH 09/21/2018 32.5  26.0 - 34.0 pg Final   ??? MCHC 09/21/2018 32.4  31.0 - 37.0 g/dL Final   ??? RDW 84/69/6295 21.1* 12.0 - 15.0 % Final   ??? MPV 09/21/2018 8.2  7.0 - 10.0 fL Final   ??? Platelet 09/21/2018 191  150 - 440 10*9/L Final   ??? Neutrophils % 09/21/2018 48.8  % Final   ??? Lymphocytes % 09/21/2018 35.9  % Final   ??? Monocytes % 09/21/2018 7.4  % Final   ??? Eosinophils % 09/21/2018 2.5  % Final   ??? Basophils % 09/21/2018 1.9  % Final   ??? Absolute Neutrophils 09/21/2018 3.6  2.0 - 7.5 10*9/L Final   ??? Absolute Lymphocytes 09/21/2018 2.7  1.5 - 5.0 10*9/L Final   ??? Absolute Monocytes 09/21/2018 0.6  0.2 - 0.8 10*9/L Final   ??? Absolute Eosinophils 09/21/2018 0.2  0.0 - 0.4 10*9/L Final   ??? Absolute Basophils 09/21/2018 0.1  0.0 - 0.1 10*9/L Final   ??? Large Unstained Cells 09/21/2018 4  0 - 4 % Final   ??? Macrocytosis 09/21/2018 Marked* Not Present Final   ??? Anisocytosis 09/21/2018 Moderate* Not Present Final   ??? Smear Review Comments 09/21/2018 See Comment* Undefined Final       ADDITIONAL INSTRUCTIONS:  In addition to the plan outlined above, please call us if you experience:   1. Nausea  or vomiting not controlled by nausea medicines  2. Diarrhea with more then 4 bowel movements a day not controlled by immodium  3. Fever of 101.5 F or higher   4. Uncontrolled pain  5. Any other concerning symptom    For health related questions call: the oncology nurse triage phone line at [phone]  For appointment changes call: Main Clinic [phone]  After hours call [facility] Operator and ask for the Oncology Fellow on call: [phone]      Milana Huntsman, MD  GI Medical Oncology  [facility] Hematology/Oncology  [address]  [place], [place] [address]

## 2018-09-30 NOTE — Unmapped (Signed)
ASSESSMENT AND PLAN:  Renee Houston is a 84 yo F from [place], [place], who presents for follow-up of pT3N2 rectal adenocarcinoma. She is now s/p APR 05/11/18. However, postoperative imaging showed metastatic disease to lungs    1. Metastatic rectal adenocarcinoma. MSS, TMB-Low (4 muts/Mb). KRAS G12D mutant.  Initially diagnosed with stage IIIB disease, but found to have lung metastases.  -Patient had decided not to resume capecitabine given side effects, and feels that her quality of life is better off capecitabine. Thus, she has not taken any systemic therapy since late November. I discussed with her the result of the CEA from early Dec showing decline in CEA. She and her daughter were surprised to hear this, since they had assumed that the therapy was not working well, which made them feel more comfortable with stopping the therapy. I discussed that the decline in CEA is reassuring that disease is indeed being controlled by capecitabine, though we would need restaging scans to confirm. For the time being, Renee Houston does not want to resume capecitabine during the holiday season. Thus, we will plan to follow up in January with repeat scans and to discuss any treatment decisions.  -We discussed that options include to not resume therapy and proceed with best supportive care, or else to consider resuming capecitabine but with a reduced dose. For example, we could consider the maintenance capecitabine dose of 625 mg/m2 po bid continuously as in CAIRO3 (with additional 25% dose reduction given creatinine clearance <50 - so would be equivalent of 1 tab QAM and 2 tabs QPM). She will consider options and we will finalize plans in January.  -We previously discussed that goals of treatment are to prolong life and maximize quality of life but realistically are not curative. They are planning to slowly discuss this news with other family members within the next couple of months.  -Patient consented to chemotherapy with capecitabine 07/07/18.  -Foundation One showed KRAS G12D. MSS, TMB-Low (4 muts/Mb). Also BRAF S467L, SMAD4 D537G, and TP53 R175H. VUS include AKT2 A400S, CCNE1 G400S, RB1 amplification, SMAD4 F339L, and TEK amplification     2. Supportive  -Nausea: none at present. Has prochlorperazine prn with chemo (patient notes ondansetron caused headache)  -Diarrhea: none at present  -Rash: Resolved. If recurs in future should use OTC hydrocortisone cream topically, and if still itchy can take antihistamine.  -No significant pain  -Emotional: patient and her daughter are understandably upset at the news of metastasis but are appreciative of her care thus far.    3. DM  -Continue metformin    4. HTN  -Continue telmisartan    5. Hx of stroke in Oct 2018  -Patient reports she had been on baby ASA since the stroke, but this has been on hold since her diagnosis. Will need to consider cardiovascular risks of any antiangiogenic therapy before starting.    6. Disposition  -Follow up with me in mid January to discuss restaging CT results and final decisions about treatment      -----------------  ONCOLOGIC HISTORY:  -03/02/18: Admitted at [facility] with several weeks of rectal bleeding  -03/04/18: Flexible sigmoidoscopy found ulcerated non-obstructing rectal mass, partially circumferential and involving half of lumen circumference, 5 cm in length. Biopsy of rectum showed moderately differentiated invasive adenocarcinoma  -03/04/18: CT A/P with IV contrast showed no evidence of metastatic disease in the abdomen and pelvis. Known rectal mass, consistent with known malignancy better assessed on concurrent pelvic MRI.  -03/04/18: CT Chest showed bilateral subcentimeter pulmonary  nodules, indeterminate. Recommend short interval follow-up (3 months), given reported history of rectal cancer. Cardiomegaly.  -03/05/18: CEA 30.3  -03/06/18: colonoscopy found 7 mm hepatic flexure polyp, 4 mm sigmoid polyp, thickened folds in cecum, and known rectal carcinoma. Cecal polyp favored early hyperplastic polyp, negative for dysplasia and malignancy. Colon polyps x 3 from hepatic flexure and sigmoid showed 6 fragments of tubular adenoma, negative for high grade dysplasia and malignancy  -03/13/18: PET-CT showed no evidence of metabolically active nodal or distant metastatic disease. Sensitivity may be decreased in the large bowel due to metformin use; uptake in primary rectal cancer is not clearly visible over and above diffuse hypermetabolism due to metformin.  -03/22/18: MRI Pelvis showed low rectal tumor radiographically staged as T3c, Nx, Mx. No obvious evidence of metastatic disease in the lymph nodes. However, a few tiny indeterminate perirectal and superior rectal chain lymph nodes are noted. The shortest distance between the tumor and MRF is 1.4 mm but this is evaluation is mildly limited due to motion artifacts. Clear sphincter margin. EMVI present.  -7/29-05/17/18: Admitted for APR. Discharged home with home health  -05/11/18: Robotic APR and robotic omental flap, with end sigmoid colostomy. Pathology of rectum and anus showed 4.4 cm moderately differentiated adenocarcinoma with mucinous features, invading into adjacent fat. Margins negative. 4/24 lymph nodes positive for metastatic carcinoma, and 2 soft tissue deposits present. No LVI or pNI. pT3N2. IHC showed proficient mismatch repair, and MSS by PCR. Foundation One showed KRAS G12D. MSS, TMB-Low (4 muts/Mb). Also BRAF S467L, SMAD4 D537G, and TP53 R175H. VUS include AKT2 A400S, CCNE1 G400S, RB1 amplification, SMAD4 F339L, and TEK amplification   -Postoperatively developed two areas of wound dehiscence in perineum requiring weekly clinic checks.  -07/08/18: Started Xeloda. However, noted CEA to be elevated at 17.9  -08/07/18: CT C/A/P showed interval increase of size and number of bilateral pulmonary nodules concerning for progression of metastatic disease. Postsurgical changes following APR and left lower quadrant end ileostomy formation. Indeterminate subcentimeter hypodensity in the liver near the hepatic dome, too small to characterize, but not definitively seen on prior CT. Close attention on follow-up is recommended. Chronic pancreatitis.  -09/03/18: Patient decided to hold treatment to improve symptoms      HISTORY OF PRESENT ILLNESS:  Renee Houston is a 84 yo F from [place], [place], who presents for follow-up of pT3N2 rectal adenocarcinoma, s/p APR 05/11/18. She has completed 3 cycles of Xeloda and presents for follow-up.     Notably, she'd started a longer treatment break, which we'd planned, at the end of Nov so that she would have a break for her granddaughter's wedding Dec 8. She enjoyed the wedding and actually felt significant improvement of side effects like fatigue and rash. She felt her quality of life was much better. Given this, she decided not to resume capecitabine as planned, and so she's remained off capecitabine. She feels essentially normal off therapy, without significant pain or shortness of breath.    She is accompanied in clinic by her daughter.      REVIEW OF SYSTEMS:  Constitutional: No fevers or chills. Weight stable.  HEENT: No double vision or blurry vision.  RESP: No dyspnea or cough. No hemoptysis  CARDIAC: no chest pain or palpitations  GI: No nausea, vomiting, diarrhea, constipation, hematochezia, melena, dysphagia, or odynophagia. No abdominal pain.   GU: No dysuria or hematuria.   MSK: No new bony or joint pains.  DERM: No rashes or changes in skin.  HEM/LYMPH: No easy bleeding  or bruising, no enlarged lymph nodes  NEURO: No dizziness, weakness, numbness, or tingling.  Remainder of review of systems negative.  ECOG PS 1      PAST MEDICAL HISTORY:  Past Medical History:   Diagnosis Date   ??? Diabetes mellitus (CMS-HCC)    ??? Generalized headaches    ??? Hard to intubate    ??? Hypertension    Rectal cancer as described in HPI  HTN  DM  Hx of mild stroke in Oct 2018 - reports having left arm weakness when she woke up. Says MRI did confirm stroke, but had near normalization of left hand function, and no limitation.  Hx of back surgery many years ago  Hx of bilateral knee replacements  Hx of bilateral cataract surgeries  Hx of appendectomy many years ago  Hx of tonsillectomy many years ago    FAMILY HISTORY:  An uncle had bone cancer  Another uncle had throat cancer    SOCIAL HISTORY:  Lives alone. Widowed. Has 1 daughter and 4 grandchildren. Retired; previously worked as a [employer] operational officer. Never smoked. No alcohol. No illicits.    ALLERGIES:  Allergies   Allergen Reactions   ??? Ampicillin Hives   ??? Penicillins Other (See Comments) and Hives     Other reaction  Other reaction  Has patient had a PCN reaction causing immediate rash, facial/tongue/throat swelling, SOB or lightheadedness with hypotension: Unknown  Has patient had a PCN reaction causing severe rash involving mucus membranes or skin necrosis: Unknown  Has patient had a PCN reaction that required hospitalization: Unknown  Has patient had a PCN reaction occurring within the last 10 years: No  If all of the above answers are NO, then may proceed with Cephalosporin use.   ??? Hydrocodone-Homatropine Other (See Comments)     tachycardia   ??? Prednisone Other (See Comments)     mind spins   ??? Amoxicillin Rash   ??? Citalopram Diarrhea   ??? Tramadol Itching       MEDICATIONS:    Current Outpatient Medications:   ???  acetaminophen (TYLENOL) 500 MG tablet, Take 1,000 mg by mouth every eight (8) hours as needed for pain. , Disp: , Rfl:   ???  amLODIPine (NORVASC) 2.5 MG tablet, Take 2.5 mg by mouth nightly. , Disp: , Rfl:   ???  blood sugar diagnostic (ONETOUCH ULTRA BLUE TEST STRIP) Strp, USE EVERY DAY, Disp: , Rfl:   ???  doxepin (SINEQUAN) 10 MG capsule, Take 10 mg by mouth nightly., Disp: , Rfl:   ???  metFORMIN (GLUCOPHAGE) 500 MG tablet, Take 500 mg by mouth daily. , Disp: , Rfl:   ???  telmisartan (MICARDIS) 40 MG tablet, Take 40 mg by mouth daily., Disp: , Rfl:   ???  UNABLE TO FIND, Juice plus(powder)-supplement taken daily with liquid, Disp: , Rfl:   ???  capecitabine (XELODA) 500 MG tablet, Take 2 tablets by mouth in the morning and 3 tablets by mouth in the evening for 14 days; and then take 7 days off., Disp: 70 tablet, Rfl: 6  ???  ondansetron (ZOFRAN-ODT) 4 MG disintegrating tablet, 4 mg. , Disp: , Rfl:   ???  prochlorperazine (COMPAZINE) 10 MG tablet, Take 1 tablet (10 mg total) by mouth every six (6) hours as needed for nausea. (Patient not taking: Reported on 08/18/2018), Disp: 30 tablet, Rfl: 3        PHYSICAL EXAM:  VITALS: BP 168/65  - Pulse 66  - Temp 36.6 ??C (97.8 ??F) (  Temporal)  - Resp 16  - Ht 152.4 cm (5')  - Wt 57.2 kg (126 lb 3.2 oz)  - SpO2 99%  - BMI 24.65 kg/m??   GENERAL: well-built, well-appearing, in no acute distress  HEENT: Normocephalic, atraumatic. Sclerae anicteric. Conjunctivae without pallor. Oropharynx clear. Mucous membranes moist.  NECK: Supple. No palpable cervical or supraclavicular lymphadenopathy  RESP: Lungs clear to auscultation bilaterally.   CV: Regular rate and rhythm, normal S1 and S2, no murmurs/rubs/gallops  BACK: Nontender on palpation over midline spinous processes.  ABD: Soft, nontender, nondistended, normoactive bowel sounds. No palpable masses or hepatosplenomegaly.   EXTREMITIES: Warm and well perfused. No peripheral edema. 2+ radial and DP pulses bilaterally  SKIN: Hyperpigmented macules mostly on dorsum of hands and arms bilaterally. No other lesions or rashes.  PSYCH: Affect appropriate.  NEURO: Alert and oriented x3. Pupils equal, round, and reactive to light. Normal gait.      LABS:  Reviewed. Notably following labs:  Lab Results   Component Value Date    WBC 7.5 09/21/2018    HGB 12.4 (L) 09/21/2018    HCT 38.2 09/21/2018    PLT 191 09/21/2018       Lab Results   Component Value Date    NA 140 09/21/2018    K 4.0 09/21/2018    CL 102 09/21/2018    CO2 29.0 09/21/2018    BUN 19 09/21/2018    CREATININE 0.78 09/21/2018    GLU 148 09/21/2018    CALCIUM 9.5 09/21/2018       Lab Results   Component Value Date    BILITOT 0.7 09/21/2018    PROT 6.6 09/21/2018    ALBUMIN 4.2 09/21/2018    ALT 11 09/21/2018    AST 23 09/21/2018    ALKPHOS 68 09/21/2018     CEA 6.7 (down from 23.8)          Madelin Headings, MD  [facility]  Gastrointestinal Medical Oncology

## 2018-11-11 ENCOUNTER — Ambulatory Visit: Admit: 2018-11-11 | Discharge: 2018-11-11 | Payer: MEDICARE

## 2018-11-11 DIAGNOSIS — C20 Malignant neoplasm of rectum: Principal | ICD-10-CM

## 2018-11-13 ENCOUNTER — Ambulatory Visit
Admit: 2018-11-13 | Discharge: 2018-11-14 | Payer: MEDICARE | Attending: Hematology & Oncology | Primary: Hematology & Oncology

## 2018-11-13 DIAGNOSIS — C20 Malignant neoplasm of rectum: Principal | ICD-10-CM

## 2019-03-15 ENCOUNTER — Ambulatory Visit: Admit: 2019-03-15 | Discharge: 2019-03-16 | Payer: MEDICARE

## 2019-03-15 DIAGNOSIS — C20 Malignant neoplasm of rectum: Principal | ICD-10-CM

## 2019-03-19 DIAGNOSIS — C20 Malignant neoplasm of rectum: Principal | ICD-10-CM

## 2019-03-20 ENCOUNTER — Telehealth
Admit: 2019-03-20 | Discharge: 2019-03-20 | Payer: MEDICARE | Attending: Hematology & Oncology | Primary: Hematology & Oncology

## 2019-03-26 ENCOUNTER — Telehealth
Admit: 2019-03-26 | Discharge: 2019-03-27 | Payer: MEDICARE | Attending: Hematology & Oncology | Primary: Hematology & Oncology

## 2019-03-26 DIAGNOSIS — C20 Malignant neoplasm of rectum: Principal | ICD-10-CM

## 2019-03-26 MED ORDER — CAPECITABINE 500 MG TABLET
ORAL_TABLET | 6 refills | 0 days | Status: CP
Start: 2019-03-26 — End: 2019-04-13
  Filled 2019-03-29: qty 90, 30d supply, fill #0

## 2019-03-26 MED ORDER — PROCHLORPERAZINE MALEATE 10 MG TABLET
ORAL_TABLET | Freq: Four times a day (QID) | ORAL | 3 refills | 0 days | Status: CP | PRN
Start: 2019-03-26 — End: ?

## 2019-03-26 NOTE — Unmapped (Signed)
Video visit  Date of service: 03/26/19   Length of call: 28 min      ASSESSMENT AND PLAN:  Renee Houston is a 85 yo F from [place], [place], who presents for follow-up of pT3N2 rectal adenocarcinoma. She is now s/p APR 05/11/18. However, postoperative imaging showed metastatic disease to lungs. She received capecitabine monotherapy in late Sept through Nov 2019 with improvement of CEA, but she decided to hold on further therapy at that time for quality of life. She now presents for follow-up and discussion on restarting lower dose capecitabine vs best supportive care.    1. Metastatic rectal adenocarcinoma. MSS, TMB-Low (4 muts/Mb). KRAS G12D mutant.  Initially diagnosed with stage IIIB disease, but found to have lung metastases. She has been on observation off therapy, but most recent CT 03/15/19 showed progression.  -We once more discussed results of restaging scans and the rising tumor markers which confirms definitive progression of disease.    - We once again went through the options with her:    A) Xeloda at a lower maintenance dose as per CAIRO3, which if tolerable would lead [place] to repeat staging in 8 weeks. The dosing is as per CAIRO3 at 625 mg/m2 po bid continuously, with additional 25% dose reduction given creatinine clearance <50 - so would be equivalent of 1 tab QAM and 2 tabs QPM.  We would additionally refer her to palliative care to help address evolving cancer related symptom burden and should the Xeloda be poorly tolerated, they would allow for     B) Best supportive care - we discussed that she has been off any form of cancer directed therapy for 4 months with good quality of life and choosing to have symptom based management is also a very reasonable approach, however with the caveat of ongoing disease progression.    When presented with the options, Renee Houston in discussion with her daughter decided to proceed with palliative lower dose therapy with Xeloda with the hope that this is better tolerated, but with the additional reassurance of combined palliative care team involvement to help address symptom burden and also to discuss logistics of palliative care options downstream.    -We previously discussed that goals of treatment are to prolong life and maximize quality of life but realistically are not curative. We discussed this again today in making treatment decisions.  -Foundation One showed KRAS G12D. MSS, TMB-Low (4 muts/Mb). Also BRAF S467L, SMAD4 D537G, and TP53 R175H. VUS include AKT2 A400S, CCNE1 G400S, RB1 amplification, SMAD4 F339L, and TEK amplification     2. Supportive  -Nausea: none at present. Resent prochlorperazine prn with chemo (patient notes ondansetron caused headache)  -Diarrhea: none at present  -No significant pain  -No significant emotional distress    3. DM  -Continue metformin    4. HTN  -Continue telmisartan    5. Hx of stroke in Oct 2018  -Patient reports she had been on baby ASA since the stroke, but this has been on hold since her diagnosis.     6. Disposition  -Follow up with me in 4 wk to discuss tolerability.  - Pharmacy follow up for Xeloda teaching  - Palliative care referral sent    The patient was staffed with Dr. Gabriela Eves, MD, MS  Hematology and Oncology Fellow        I spent 28 minutes on the audio/video with the patient. I spent an additional 15 minutes on pre- and post-visit activities.  The patient was physically located in [place] or a state in which I am permitted to provide care. The patient and/or parent/gauardian understood that s/he may incur co-pays and cost sharing, and agreed to the telemedicine visit. The visit was completed via phone and/or video, which was appropriate and reasonable under the circumstances given the patient's presentation at the time.    The patient and/or parent/guardian has been advised of the potential risks and limitations of this mode of treatment (including, but not limited to, the absence of in-person examination) and has agreed to be treated using telemedicine. The patient's/patient's family's questions regarding telemedicine have been answered.     If the phone/video visit was completed in an ambulatory setting, the patient and/or parent/guardian has also been advised to contact their provider???s office for worsening conditions, and seek emergency medical treatment and/or call 911 if the patient deems either necessary.      This was a telehealth service where a resident was involved. I saw and evaluated the patient via real-time audio/video connection, participating in the key portions of the service.  I reviewed the resident's note.  I agree with the resident's findings and plan.    Milana Huntsman, MD  GI Medical Oncology  [place] [facility]      -----------------  ONCOLOGIC HISTORY:  -03/02/18: Admitted at [place] with several weeks of rectal bleeding  -03/04/18: Flexible sigmoidoscopy found ulcerated non-obstructing rectal mass, partially circumferential and involving half of lumen circumference, 5 cm in length. Biopsy of rectum showed moderately differentiated invasive adenocarcinoma  -03/04/18: CT A/P with IV contrast showed no evidence of metastatic disease in the abdomen and pelvis. Known rectal mass, consistent with known malignancy better assessed on concurrent pelvic MRI.  -03/04/18: CT Chest showed bilateral subcentimeter pulmonary nodules, indeterminate. Recommend short interval follow-up (3 months), given reported history of rectal cancer. Cardiomegaly.  -03/05/18: CEA 30.3  -03/06/18: colonoscopy found 7 mm hepatic flexure polyp, 4 mm sigmoid polyp, thickened folds in cecum, and known rectal carcinoma. Cecal polyp favored early hyperplastic polyp, negative for dysplasia and malignancy. Colon polyps x 3 from hepatic flexure and sigmoid showed 6 fragments of tubular adenoma, negative for high grade dysplasia and malignancy  -03/13/18: PET-CT showed no evidence of metabolically active nodal or distant metastatic disease. Sensitivity may be decreased in the large bowel due to metformin use; uptake in primary rectal cancer is not clearly visible over and above diffuse hypermetabolism due to metformin.  -03/22/18: MRI Pelvis showed low rectal tumor radiographically staged as T3c, Nx, Mx. No obvious evidence of metastatic disease in the lymph nodes. However, a few tiny indeterminate perirectal and superior rectal chain lymph nodes are noted. The shortest distance between the tumor and MRF is 1.4 mm but this is evaluation is mildly limited due to motion artifacts. Clear sphincter margin. EMVI present.  -7/29-05/17/18: Admitted for APR. Discharged home with home health  -05/11/18: Robotic APR and robotic omental flap, with end sigmoid colostomy. Pathology of rectum and anus showed 4.4 cm moderately differentiated adenocarcinoma with mucinous features, invading into adjacent fat. Margins negative. 4/24 lymph nodes positive for metastatic carcinoma, and 2 soft tissue deposits present. No LVI or pNI. pT3N2. IHC showed proficient mismatch repair, and MSS by PCR. Foundation One showed KRAS G12D. MSS, TMB-Low (4 muts/Mb). Also BRAF S467L, SMAD4 D537G, and TP53 R175H. VUS include AKT2 A400S, CCNE1 G400S, RB1 amplification, SMAD4 F339L, and TEK amplification   -Postoperatively developed two areas of wound dehiscence in perineum requiring  weekly clinic checks.  -07/08/18: Started Xeloda. However, noted CEA to be elevated at 17.9  -08/07/18: CT C/A/P showed interval increase of size and number of bilateral pulmonary nodules concerning for progression of metastatic disease. Postsurgical changes following APR and left lower quadrant end ileostomy formation. Indeterminate subcentimeter hypodensity in the liver near the hepatic dome, too small to characterize, but not definitively seen on prior CT. Close attention on follow-up is recommended. Chronic pancreatitis.  -09/03/18: Patient decided to hold treatment to improve symptoms -11/11/18: CT C/A/P showed interval subtle increase of size of pulmonary metastases. No evidence of new sites of metastatic disease in the abdomen pelvis.  -03/15/19: CT C/A/P showed interval disease progression with increase in size and number of multifocal bilateral metastatic pulmonary nodules. A new heterogeneous hypodense rim-enhancing lesion is identified adjacent to the pelvic floor posterior to the vagina and cervix measuring 2.3 cm. This may represent a recurrent disease versus complex collection. CEA elevated to 170    HISTORY OF PRESENT ILLNESS:  Renee Houston is a 85 yo F from [place], [place], who presents for follow-up of pT3N2 rectal adenocarcinoma, s/p APR 05/11/18, but subsequently found to have multiple unresectable lung metastases. She completed 3 cycles of Xeloda in late Sept through late Nov 2019 but due to side effects decided to hold on further therapy, so she's been on observation only since late Nov 2019. She now presents for video follow-up visit.     Renee Houston continues to feel well. She has a good appetite and continues to work around [employer], cook and clean. She has not had any falls for the past 3 months. She has a chronic cough that is non-productive. She is self isolating with her daughter and neither have had exposure to [place]. She has an isolated episode of diarrhea following sea food, otherwise no issues with pain or bleeding. She has no changes in her weight and rarely naps in the day.    She takes the video call at home and is accompanied by her daughter.      REVIEW OF SYSTEMS:  Constitutional: No fevers or chills. Weight stable.  HEENT: No double vision or blurry vision.  RESP: No dyspnea or cough. No hemoptysis  CARDIAC: no chest pain or palpitations  GI: No nausea, vomiting, diarrhea, constipation, hematochezia, melena, dysphagia, or odynophagia. No abdominal pain. Occasional dyspepsia.  GU: No dysuria or hematuria.   MSK: No new bony or joint pains.  DERM: No rashes or changes in skin.  HEM/LYMPH: No easy bleeding or bruising, no enlarged lymph nodes  NEURO: No dizziness, weakness, numbness, or tingling.  Remainder of review of systems negative.  ECOG PS 1      PAST MEDICAL HISTORY:  Past Medical History:   Diagnosis Date   ??? Diabetes mellitus (CMS-HCC)    ??? Generalized headaches    ??? Hard to intubate    ??? Hypertension    Rectal cancer as described in HPI  HTN  DM  Hx of mild stroke in Oct 2018 - reports having left arm weakness when she woke up. Says MRI did confirm stroke, but had near normalization of left hand function, and no limitation.  Hx of back surgery many years ago  Hx of bilateral knee replacements  Hx of bilateral cataract surgeries  Hx of appendectomy many years ago  Hx of tonsillectomy many years ago    FAMILY HISTORY:  An uncle had bone cancer  Another uncle had throat cancer    SOCIAL HISTORY:  Lives alone. Widowed. Has 1 daughter and 4 grandchildren. Retired; previously worked as a [employer] operational officer. Never smoked. No alcohol. No illicits.    ALLERGIES:  Allergies   Allergen Reactions   ??? Ampicillin Hives   ??? Penicillins Other (See Comments) and Hives     Other reaction  Other reaction  Has patient had a PCN reaction causing immediate rash, facial/tongue/throat swelling, SOB or lightheadedness with hypotension: Unknown  Has patient had a PCN reaction causing severe rash involving mucus membranes or skin necrosis: Unknown  Has patient had a PCN reaction that required hospitalization: Unknown  Has patient had a PCN reaction occurring within the last 10 years: No  If all of the above answers are NO, then may proceed with Cephalosporin use.   ??? Hydrocodone-Homatropine Other (See Comments)     tachycardia   ??? Prednisone Other (See Comments)     mind spins   ??? Amoxicillin Rash   ??? Citalopram Diarrhea   ??? Tramadol Itching       MEDICATIONS:    Current Outpatient Medications:   ???  acetaminophen (TYLENOL) 500 MG tablet, Take 1,000 mg by mouth every eight (8) hours as needed for pain. , Disp: , Rfl:   ???  amLODIPine (NORVASC) 2.5 MG tablet, Take 2.5 mg by mouth nightly. , Disp: , Rfl:   ???  blood sugar diagnostic (ONETOUCH ULTRA BLUE TEST STRIP) Strp, USE EVERY DAY, Disp: , Rfl:   ???  capecitabine (XELODA) 500 MG tablet, Take 2 tablets by mouth in the morning and 3 tablets by mouth in the evening for 14 days; and then take 7 days off., Disp: 70 tablet, Rfl: 6  ???  doxepin (SINEQUAN) 10 MG capsule, Take 10 mg by mouth nightly., Disp: , Rfl:   ???  metFORMIN (GLUCOPHAGE) 500 MG tablet, Take 500 mg by mouth daily. , Disp: , Rfl:   ???  ondansetron (ZOFRAN-ODT) 4 MG disintegrating tablet, 4 mg. , Disp: , Rfl:   ???  prochlorperazine (COMPAZINE) 10 MG tablet, Take 1 tablet (10 mg total) by mouth every six (6) hours as needed for nausea., Disp: 30 tablet, Rfl: 3  ???  telmisartan (MICARDIS) 40 MG tablet, Take 40 mg by mouth daily., Disp: , Rfl:   ???  UNABLE TO FIND, Juice plus(powder)-supplement taken daily with liquid, Disp: , Rfl:     Not taking capecitabine at this time.      PHYSICAL EXAM:  The physical exam was deferred, as this was a video visit as per the COVID-19 action plan to help decrease chance of transmission  Generally appeared well built, well appearing, in no acute distress. Speaking comfortably. No scleral icterus.      LABS:  Reviewed. Notably following labs:  Lab Results   Component Value Date    WBC 7.6 03/15/2019    HGB 13.5 03/15/2019    HCT 41.0 03/15/2019    PLT 254 03/15/2019       Lab Results   Component Value Date    NA 143 03/15/2019    K 4.3 03/15/2019    CL 105 03/15/2019    CO2 29.0 03/15/2019    BUN 17 03/15/2019    CREATININE 0.8 03/15/2019    GLU 106 03/15/2019    CALCIUM 9.7 03/15/2019       Lab Results   Component Value Date    BILITOT 0.5 03/15/2019    PROT 7.0 03/15/2019    ALBUMIN 4.4 03/15/2019    ALT 7 03/15/2019  AST 22 03/15/2019    ALKPHOS 72 03/15/2019     CEA 170 (from 8.4)      IMAGING  Personally reviewed images  -03/15/19: CT C/A/P showed interval disease progression with increase in size and number of multifocal bilateral metastatic pulmonary nodules. A new heterogeneous hypodense rim-enhancing lesion is identified adjacent to the pelvic floor posterior to the vagina and cervix measuring 2.3 cm. This may represent a recurrent disease versus complex collection.

## 2019-03-26 NOTE — Unmapped (Signed)
[facility] Specialty Medication Referral: No PA required    Medication (Brand/Generic): Capecitabine    Initial Benefits Investigation Claim completed with resulted information below:  No PA required  Patient ABLE to fill at [facility]  Insurance Company:  [employer]  Anticipated Copay: $50    As Co-pay is under $25 defined limit, per policy there will be no further investigation of need for financial assistance at this time unless patient requests. This referral has been communicated to the provider and handed off to the [facility] team for further processing and filling of prescribed medication.   ______________________________________________________________________  Please utilize this referral for viewing purposes as it will serve as the central location for all relevant documentation and updates.

## 2019-03-29 MED FILL — CAPECITABINE 500 MG TABLET: 30 days supply | Qty: 90 | Fill #0 | Status: AC

## 2019-03-29 NOTE — Unmapped (Signed)
[facility]   Patient Onboarding/Medication Counseling    Ms.Guzzetta is a 85 y.o. female with cancer who I am counseling today on continuation of therapy.  I am speaking to patient's daughter.    Verified patient's date of birth / HIPAA.    Specialty medication(s) to be sent: Hematology/Oncology: Capecitabine 500mg       Non-specialty medications/supplies to be sent: none      Medications not needed at this time: none         Xeloda (capecitabine)    Medication & Administration     Dosage: Take 1 tablet (500mg ) by mouth in the morning and 2 tablets (1000mg ) in the evening.    Administration: I reviewed the importance of taking with a full glass of water within 30 minutes of a meal (at least 1 cup of food). Discussed that tablet should be swallowed whole and cannot be crushed or chewed.    Adherence/Missed dose instruction: If a dose is missed, do not take an extra dose or two doses at one time. Simply take your next dose at the regularly scheduled time and record any missed doses so our team is aware.    Goals of Therapy     Prevent disease progression    Side Effects & Monitoring Parameters     ? Nausea/vomiting  ? Diarrhea/constipation  ? Infection precautions  ? Fatigue  ? Mouth sores or irritation  ? Hand/foot syndrome (tingling, numbness, pain, redness, peeling or blistering) Use Urea 20% cream, Aquaphor, Eucerin, Cetaphil, or CeraVe twice daily on hands and feet as preventative  ? Sun precautions  ? Decrease appetite/ taste changes  ? Bleeding precautions (bruising easily, nose bleeds, gums bleed)  ? Headache  ? Dry skin  ? Hair loss    The following side effects should be reported to the provider:  ? Diarrhea not controlled by anti-diarrheals  ? Swelling, warmth, numbness, change of color or pain in a leg or arm  ? Signs of infection (fever >100.4, chills, sore throat, sputum production)  ? Signs of liver problems (dark urine, abdominal pain, light-colored stools, vomiting, yellow skin or eyes)  ? Signs of bleeding (vomiting or coughing up blood, blood that looks like coffee grounds, blood in the urine or black, red tarry stools, bruising that gets bigger without reason, any persistent or severe bleeding)  ? Skin rash or signs of skin infection (cracking, peeling, blistering, bleeding skin, red or irritated eyes)  ? Signs of fluid and electrolyte problems (mood changes, confusion, abnormal/fast  heartbeat, severe dizziness, passing out, increased thirst, seizures, loss of strength and energy, lack of appetite, unable to pass urine or change in amount of urine passed, dry mouth, dry eyes, or nausea or vomiting.  ? Signs of hand foot syndrome (redness or irritation on the palms of the hands or soles of the feet)  ? Signs of mucositis (red or swollen mouth/gums, sores in the mouth, gums or tongue, soreness or pain in the mouth or throat, difficulty swallowing or talking, dryness, mild burning, or pain when eating food Crisco patches or pus in the mouth or on the tongue, increased mucus or thicker saliva)  ? Signs of anaphylaxis (wheezing, chest tightness, swelling of face, lips, tongue or throat)    Monitoring parameters: Renal function should be estimated at baseline to determine initial dose. During therapy, CBC with differential, hepatic function, and renal function should be monitored. Monitor INR closely if receiving concomitant warfarin. Pregnancy test prior to treatment initiation (  in females of reproductive potential). Monitor for diarrhea, dehydration, hand-foot syndrome, Stevens-Johnson syndrome, toxic epidermal necrolysis, stomatitis, and cardiotoxicity. Monitor adherence.    Contraindications, Warnings & Precautions     [place] Boxed Warning: Capecitabine may increase the anticoagulant effects of warfarin; bleeding events, including death, have occurred with concomitant use. Clinically significant increases in prothrombin time (PT) and INR have occurred within several days to months after capecitabine initiation (in patients previously stabilized on anticoagulants), and may continue up to 1 month after capecitabine discontinuation. May occur in patients with or without liver metastases. Monitor PT and INR frequently and adjust anticoagulation dosing accordingly. An increased risk of coagulopathy is correlated with a cancer diagnosis and age >60 years.    Contraindications:  ? Known hypersensitivity to capecitabine, fluorouracil, or any component of the formulation;   ? Severe renal impairment (CrCl <30 mL/minute)    Warnings and Precautions:  ? Bone marrow suppression  ? Cardiotoxicity: Myocardial infarction, ischemia, angina, dysrhythmias, cardiac arrest, cardiac failure, sudden death, ECG changes, and cardiomyopathy  ? Mouth sores-discussed use of baking soda/salt water rinses  ? Dermatologic toxicity: Stevens-Johnson syndrome and toxic epidermal necrolysis   ? Hand/foot prevention (moisturizers, luke warm hand washes, patting hands/feet dry, decrease rubbing on hands/feet)  ? GI toxicity: May cause diarrhea (may be severe); Importance of good nutrition to help minimize diarrhea (high protein, BRAT, yogurt, avoid greasy and spicy foods).  Importance of hydration if having frequent diarrhea  ? Reproductive concerns: Evaluate pregnancy status prior to therapy in females of reproductive potential. Females of reproductive potential should use effective contraception during treatment and for 6 months after the last dose. Males with female partners of reproductive potential should use effective contraception during treatment and for 3 months after the last dose. Based on the mechanism of action and data from animal reproduction studies, in utero exposure to capecitabine may cause fetal harm.   ? Breast feeding considerations: It is not known if capecitabine is present in breast milk. Due to the potential for serious adverse reactions in the breastfed infant, breastfeeding is not recommended by the manufacturer during treatment and for 2 weeks after the last dose.    Drug/Food Interactions     ? Medication list reviewed in Epic. The patient was instructed to inform the care team before taking any new medications or supplements. No drug interactions identified.   ? Avoid live vaccines.    Storage, Handling Precautions, & Disposal     ? This medication should be stored at room temperature and in a dry location. Keep out of reach of others including children and pets. Keep the medicine in the original container with a child-proof top (no pillboxes). Do not throw away or flush unused medication down the toilet or sink. This drug is considered hazardous and should be handled as little as possible.  If someone else helps with medication administration, they should wear gloves.      Current Medications (including OTC/herbals), Comorbidities and Allergies     Current Outpatient Medications   Medication Sig Dispense Refill   ??? acetaminophen (TYLENOL) 500 MG tablet Take 1,000 mg by mouth every eight (8) hours as needed for pain.      ??? amLODIPine (NORVASC) 2.5 MG tablet Take 2.5 mg by mouth nightly.      ??? blood sugar diagnostic (ONETOUCH ULTRA BLUE TEST STRIP) Strp USE EVERY DAY     ??? capecitabine (XELODA) 500 MG tablet Take 1 tablet (500mg ) by mouth in the morning and 2 tablets (  1000mg ) in the evening. 90 tablet 6   ??? doxepin (SINEQUAN) 10 MG capsule Take 10 mg by mouth nightly.     ??? metFORMIN (GLUCOPHAGE) 500 MG tablet Take 500 mg by mouth daily.      ??? ondansetron (ZOFRAN-ODT) 4 MG disintegrating tablet 4 mg.      ??? prochlorperazine (COMPAZINE) 10 MG tablet Take 1 tablet (10 mg total) by mouth every six (6) hours as needed for nausea. 30 tablet 3   ??? telmisartan (MICARDIS) 40 MG tablet Take 40 mg by mouth daily.     ??? UNABLE TO FIND Juice plus(powder)-supplement taken daily with liquid       No current facility-administered medications for this visit.        Allergies   Allergen Reactions   ??? Ampicillin Hives   ??? Penicillins Other (See Comments) and Hives     Other reaction  Other reaction  Has patient had a PCN reaction causing immediate rash, facial/tongue/throat swelling, SOB or lightheadedness with hypotension: Unknown  Has patient had a PCN reaction causing severe rash involving mucus membranes or skin necrosis: Unknown  Has patient had a PCN reaction that required hospitalization: Unknown  Has patient had a PCN reaction occurring within the last 10 years: No  If all of the above answers are NO, then may proceed with Cephalosporin use.   ??? Hydrocodone-Homatropine Other (See Comments)     tachycardia   ??? Prednisone Other (See Comments)     mind spins   ??? Amoxicillin Rash   ??? Citalopram Diarrhea   ??? Tramadol Itching       Patient Active Problem List   Diagnosis   ??? Malignant neoplasm of rectum (CMS-HCC)   ??? Benign essential hypertension   ??? CVA (cerebral vascular accident) (CMS-HCC)   ??? Diabetes mellitus type 2, uncomplicated (CMS-HCC)   ??? Metastasis to lung (CMS-HCC)       Reviewed and up to date in Epic.    Appropriateness of Therapy     Is medication and dose appropriate based on diagnosis? Yes    Baseline Quality of Life Assessment      How many days over the past month did your cnacer keep you from your normal activities? 0    Financial Information     Medication Assistance provided: None Required    Anticipated copay of $50 reviewed with patient. Verified delivery address.    Delivery Information     Scheduled delivery date: 03/29/19    Expected start date: 03/29/19    Medication will be delivered via Same Day Courier to the home address in [place].  This shipment will not require a signature.      Explained the services we provide at [facility] and that each month we would call to set up refills.  Stressed importance of returning phone calls so that we could ensure they receive their medications in time each month.  Informed patient that we should be setting up refills 7-10 days prior to when they will run out of medication.  A pharmacist will reach out to perform a clinical assessment periodically.  Informed patient that a welcome packet and a drug information handout will be sent.      Patient verbalized understanding of the above information as well as how to contact the pharmacy at [phone] option 4 with any questions/concerns.  The pharmacy is open Monday through Friday 8:30am-4:30pm.  A pharmacist is available 24/7 via pager to answer any  clinical questions they may have.    Patient Specific Needs     ? Does the patient have any physical, cognitive, or cultural barriers? No    ? Patient prefers to have medications discussed with  Family Member     ? Is the patient able to read and understand education materials at a high school level or above? Yes    ? Patient's primary language is  English     ? Is the patient high risk? No     ? Does the patient require a Care Management Plan? No     ? Does the patient require physician intervention or other additional services (i.e. nutrition, smoking cessation, social work)? No      [facility] Specialty Pharmacist

## 2019-03-29 NOTE — Unmapped (Signed)
Referral to Outpatient Oncology Palliative Care Clinic (OOP[facility])     03/26/19: NEW pt referral to Outpatient Palliative Care Clinic [facility]) from Dr Jarvis Morgan.      Reason for referral: GOC     Summary:   85 yo F from [place], [place], who presents for follow-up of pT3N2 rectal adenocarcinoma. She is now s/p APR 05/11/18. However, postoperative imaging showed metastatic disease to lungs. She received capecitabine monotherapy in late Sept through Nov 2019 with improvement of CEA, but she decided to hold on further therapy at that time for quality of life. Recent restaging scans. This does show definitive progression with growing and new lung nodules, and a new pelvic 2.3 cm mass. Additionally, her CEA has risen significantly, now up to 170. Patient has continued to feel very well and is essentially asymptomatic. She notes no significant symptoms or side effects. Has ostomy. She has decided to proceed with low dose Xeloda 3 tabs/day--with a focus on QOL. Was difficult decision to resume some form of treatment, opposed to continuing with no treatment, but will give it a try.     Pt lives in [place]    Patient's daughter, Phyllis-caregiver [phone]).   She reports good energy level and she has been busy reorganizing and cleaning her home with her daughter. Lives alone. Widowed. Has 1 daughter and 4 grandchildren. Retired; previously worked as a Diplomatic Services operational officer. Never smoked. No alcohol. No illicits     Plan:  Appointment request sent to [facility] scheduler for next available, new patient appointment.        Allegra Lai RN BSN OCN MS  RN Clinical Coordinator  Outpatient Oncology Palliative Care (OOP[facility])  [facility]  Pager: [phone]   Phone: [phone]  Office: [phone]

## 2019-04-01 ENCOUNTER — Telehealth
Admit: 2019-04-01 | Discharge: 2019-04-02 | Payer: MEDICARE | Attending: Student in an Organized Health Care Education/Training Program | Primary: Student in an Organized Health Care Education/Training Program

## 2019-04-01 DIAGNOSIS — Z515 Encounter for palliative care: Secondary | ICD-10-CM

## 2019-04-01 DIAGNOSIS — F419 Anxiety disorder, unspecified: Secondary | ICD-10-CM

## 2019-04-01 DIAGNOSIS — C20 Malignant neoplasm of rectum: Principal | ICD-10-CM

## 2019-04-01 DIAGNOSIS — G893 Neoplasm related pain (acute) (chronic): Secondary | ICD-10-CM

## 2019-04-01 DIAGNOSIS — Z7189 Other specified counseling: Secondary | ICD-10-CM

## 2019-04-01 NOTE — Unmapped (Signed)
OUTPATIENT ONCOLOGY PALLIATIVE CARE  Video visit    Principal Diagnosis: Renee Houston is a 85 y.o. female with metastatic rectal adenocarcinoma,  diagnosed in May 2019.  Disease sites include metastases to the lung.     Assessment/Plan:   1.  Pain: Intermittent sporadic neuropathic pain in her left arm of unclear etiology.  Tried gabapentin and this was not helpful.  Discussed possibility of starting Cymbalta, although patient would prefer to avoid medications at this point.  We will follow-up at next visit.    2.  Mood: Patient with significant underlying anxiety, although this is been lifelong and does not appear to significantly impact her ability to function.  Discussed Cymbalta as above, and will follow-up at future visits.    2.  Advance care planning: See ACP note from 6/18 for details   Daughter is HCPOA wants to complete HCPOA paperwork, we will see if we can facilitate  CODE STATUS changed to DNR/DNI today  In summary: Patient highly values quality of life and independence, although currently is feeling so well that she is willing to continue cancer directed therapy if she is able to maintain this quality of life.    Regarding end of life options she is open to the idea of hospice. States she wants to live as long as she can as long as she can maintain her independence. She has a friend who went to a hospice facility and felt that she was well cared for.     # Controlled substances risk management.   - Patient does not have a signed pain medication agreement with our team.   - [place] database was reviewed today and it was appropriate.   - Urine drug screen was not performed at this visit. Findings: not applicable.   - Patient has received information about safe storage and administration of medications.   - Patient has not received a prescription for narcan; is not applicable.       F/u: 4 to 6 weeks    ----------------------------------------  Referring Provider: Dr. Nedra Hai  Oncology Team: GI oncology  PCP: Marisue Ivan, MD      HPI: 85 year old woman with metastatic rectal adenocarcinoma to the lung.  Initially diagnosed in May 2019.    Current cancer-directed therapy: Was recently on observation but had progression of disease on 03/15/2019 and plan to restart palliative lower dose therapy with Xeloda.    Has been on the chemo for 3 days and so far tolerating things them.         Describes having a good support system.  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     Describes feeling shocked with her cancer diagnosis.     Symptom Review:  General: Overall feeling very well. Describes a little bit of heartburn.   Pain: Describes some feeling of electric shock in her L arm, she was in the kitchen making potatoe salad. Only lasted a few minutes. Started Gabapentin recently but stopped taking it bc it knocked her out. THis has gone away recently but she has a lot of anxiety about it coming back.    Fatigue: None   Mobility: None  Sleep: Normal  Appetite: Good, previously lost sense of smell with chemo  Nausea: Denies  Bowel function: Has a colostomy baf and has good regular output  Dyspnea: Denies  Secretions: Denies  Mood: They report that she internalizes everything and that when she does this it can cause  some physical sxs. Has a hx of anxiety.    Has had a dry cough for many years, likely related to Amlodipine.     Palliative Performance Scale: 80% - Ambulation: Full / Normal Activity with effort, some evidence of disease / Self-Care:Full / Intake: Normal or reduced / Level of Conscious: Full       Coping/Support Issues: Overall coping well.  Had some challenges with having her daughter stay with her, but appears to be processing these well.    Goals of Care: See ACP note from 6/18    Social History: Had been living alone in a condo by herself until recently. Still very functional and independent, can cook and clean for herself. Has lived in [place] for 20 years. Her husband passed away. Daughter [name] lived in [place]. Born in [place], lived in [place] her whole life.     Has a sister with dementia. Has 4 grandchildren, Casimiro Needle 23 lives in [place], teaches highschool, daughter 24 lives 15 minutes away, just got married. John Countryman 30 wants to be a country singer, Cristal Deer 29 lives 15 minutes away.    Her son-in-law passed away when his kids were young. Her husband committed suicide in 2006.     Name of primary support: Daughter [facility]      Advance Care Planning:   HCPOA: Daughter [name] is HCPOA contact # [phone]. She thinks she would want [place] to be secondary.   Natural surrogate decision maker:  Living Will: Has completed but [facility] doesn't have copy  ACP note: See ACP note from 6/18    Objective     Opioid Risk Tool:    Female  Female    Family history of substance abuse      Alcohol  1  3    Illegal drugs  2  3    Rx drugs  4  4    Personal history of substance abuse      Alcohol  3  3    Illegal drugs  4  4    Rx drugs  5  5    Age between 20???45 years  1  1    History of preadolescent sexual abuse  3  0    Psychological disease      ADD, OCD, bipolar, schizophrenia  2  2    Depression  1  1       Total: Low risk  (<3 low risk, 4-7 moderate risk, >8 high risk)     No history exists.       Patient Active Problem List   Diagnosis   ??? Malignant neoplasm of rectum (CMS-HCC)   ??? Benign essential hypertension   ??? CVA (cerebral vascular accident) (CMS-HCC)   ??? Diabetes mellitus type 2, uncomplicated (CMS-HCC)   ??? Metastasis to lung (CMS-HCC)       Past Medical History:   Diagnosis Date   ??? Diabetes mellitus (CMS-HCC)    ??? Generalized headaches    ??? Hard to intubate    ??? Hypertension        Past Surgical History:   Procedure Laterality Date   ??? APPENDECTOMY     ??? JOINT REPLACEMENT     ??? PR OMENTAL FLAP,INTRA-ABDOMINAL N/A 05/11/2018    Procedure: Robotic Omental Flap;  Surgeon: Mickle Asper, MD;  Location: MAIN OR [facility];  Service: Gastrointestinal   ??? PR PROCTECTOMY,AP RESECT+OSTOMY N/A 05/11/2018    Procedure: ROBOTIC XI PROCTECTOMY; COMPLT-ABDOMINOPERINEAL W/COLOSTOMY;  Surgeon: Mickle Asper,  MD;  Location: MAIN OR [facility];  Service: Gastrointestinal   ??? TONSILLECTOMY         Current Outpatient Medications   Medication Sig Dispense Refill   ??? acetaminophen (TYLENOL) 500 MG tablet Take 1,000 mg by mouth every eight (8) hours as needed for pain.      ??? amLODIPine (NORVASC) 2.5 MG tablet Take 2.5 mg by mouth nightly.      ??? blood sugar diagnostic (ONETOUCH ULTRA BLUE TEST STRIP) Strp USE EVERY DAY     ??? capecitabine (XELODA) 500 MG tablet Take 1 tablet (500mg ) by mouth in the morning and 2 tablets (1000mg ) in the evening. 90 tablet 6   ??? doxepin (SINEQUAN) 10 MG capsule Take 10 mg by mouth nightly.     ??? metFORMIN (GLUCOPHAGE) 500 MG tablet Take 500 mg by mouth daily.      ??? ondansetron (ZOFRAN-ODT) 4 MG disintegrating tablet 4 mg.      ??? prochlorperazine (COMPAZINE) 10 MG tablet Take 1 tablet (10 mg total) by mouth every six (6) hours as needed for nausea. 30 tablet 3   ??? telmisartan (MICARDIS) 40 MG tablet Take 40 mg by mouth daily.     ??? UNABLE TO FIND Juice plus(powder)-supplement taken daily with liquid       No current facility-administered medications for this visit.        Allergies:   Allergies   Allergen Reactions   ??? Ampicillin Hives   ??? Penicillins Other (See Comments) and Hives     Other reaction  Other reaction  Has patient had a PCN reaction causing immediate rash, facial/tongue/throat swelling, SOB or lightheadedness with hypotension: Unknown  Has patient had a PCN reaction causing severe rash involving mucus membranes or skin necrosis: Unknown  Has patient had a PCN reaction that required hospitalization: Unknown  Has patient had a PCN reaction occurring within the last 10 years: No  If all of the above answers are NO, then may proceed with Cephalosporin use.   ??? Hydrocodone-Homatropine Other (See Comments)     tachycardia   ??? Prednisone Other (See Comments)     mind spins   ??? Amoxicillin Rash   ??? Citalopram Diarrhea   ??? Tramadol Itching       Family History:  Cancer-related family history is not on file.  has no family status information on file.       REVIEW OF SYSTEMS:  A comprehensive review of 14 systems was negative except for pertinent positives noted in HPI.      PHYSICAL EXAM:   Vital signs for this encounter: VS reviewed in EPIC.  GEN: Awake and alert, pleasant appearing female in no acute distress, sitting comfortably on her couch  PSYCH: Alert and oriented to person, place and time. Euthymic.  HEENT: Pupils equally round without scleral icterus. No facial asymmetry.  LUNGS: No increased work of breathing.  SKIN: No rashes, petechiae or jaundice noted on visible skin  NEURO: Normal gait and coordination.    Lab Results   Component Value Date    CREATININE 0.8 03/15/2019     Lab Results   Component Value Date    ALKPHOS 72 03/15/2019    BILITOT 0.5 03/15/2019    PROT 7.0 03/15/2019    ALBUMIN 4.4 03/15/2019    ALT 7 03/15/2019    AST 22 03/15/2019            I spent 60 minutes on the audio/video with the patient. I spent an  additional 20 minutes on pre- and post-visit activities.     The patient was physically located in [place] or a state in which I am permitted to provide care. The patient and/or parent/guardian understood that s/he may incur co-pays and cost sharing, and agreed to the telemedicine visit. The visit was reasonable and appropriate under the circumstances given the patient's presentation at the time.    The patient and/or parent/guardian has been advised of the potential risks and limitations of this mode of treatment (including, but not limited to, the absence of in-person examination) and has agreed to be treated using telemedicine. The patient's/patient's family's questions regarding telemedicine have been answered.     If the phone/video visit was completed in an ambulatory setting, the patient and/or parent/guardian has also been advised to contact their provider???s office for worsening conditions, and seek emergency medical treatment and/or call 911 if the patient deems either necessary.      Kathlynn Grate, MD  [facility] Outpatient Oncology Palliative Care

## 2019-04-01 NOTE — Unmapped (Signed)
ADVANCE CARE PLANNING NOTE    Discussion Date:  April 01, 2019    Patient has decisional capacity:  Yes    Patient has selected a Health Care Decision-Maker if loses capacity: Yes    Health Care Decision Maker as of 04/01/2019    HCDM (patient stated preference): Oletta Cohn - Daughter - [phone]    Discussion Participants:  Patient, daughter [name], and Kathlynn Grate palliative care attending    Communication of Medical Status/Prognosis:   Reviewed that she currently feels very well but continues to have advanced cancer that is uncurable.  Her prognosis is uncertain but most likely hoping for 1 to 2 years, although reviewed that it is possible she can progress more quickly than this, and also possible she could live longer than this.    Communication of Treatment Goals/Options:   Reviewed that quality of life and independence are what is most important to her.  Says that she is currently feeling so well that she is willing to undergo cancer directed therapy, but would consider changing focus if she develops significant side effects.  Discussed what hospice care could look like and educated her and daughter on this.  Reviewed that given her advanced age and advanced cancer resuscitation would likely cause more harm than benefit.  Introduced the idea of whether or not she would want to be hospitalized at this point, and discussed that we could review this depending on her condition.  Did state she would like to avoid the ICU.  Also states she would like to die peacefully at home with hospice if possible.    Treatment Decisions:   ???CODE STATUS changed to DNR/DNI  ??? Continue cancer directed therapy for now  ??? Would like to avoid ICU level of care.  If she required hospitalization would encourage conversation about how likely easily reversible the cause was and if she would be likely to regain her quality of life and independence she would be willing to be hospitalized.  If not, she would prefer to transition to hospice and comfort focused care.    I spent between 16-45 minutes providing voluntary advance care planning services for this patient.

## 2019-04-09 NOTE — Unmapped (Signed)
Assessment     Medication: capecitabine 500 mg tablets - Take 1 tablet (500mg ) by mouth in the morning and 2 tablets (1000mg ) in the evening.  Start date: 03/30/19  Adherence:  ? Missed doses: 0    Toxicities:  ? Nausea (Grade 1): minimal. Only one episode since starting capecitabine. She reports taking prochlorperazine 10 mg PO x1 on 04/10/19 and nausea resolved within 30 minutes.  ? Headache: occasional (reported as mild).  ? Diarrhea (Grade 1): occasional and depends on dietary choices. She has loperamide available PRN used per OTC labeling. She reports she had one episode of diarrhea yesterday which resolved quickly after 4 mg of loperamide.  ? Dyspepsia (Grade 2): becoming more consistent. She notices heartburn symptoms around dinner. She has taken Pepcid occasionally in the past.  ? HFS (N/A): no signs/symptoms. Sensation of tightness in the skin of her hands. Renee Houston reports she has not been moisturizing hands/feet regularly. Occasionally applies lotion to feet.  ? Otherwise, patient denies significant issues with: mucositis, appetite, fatigue.    Medication Reconciliation and Drug-Drug Interactions: Medication list reviewed in Epic (see below). Patient informed to notify the team prior to starting any new prescription or over the counter medications or supplements.       Plan     1. Metastatic rectal cancer: Renee Houston continues on capecitabine 500 mg tablets: Take 1 tablet (500mg ) by mouth in the morning and 2 tablets (1000mg ) in the evening.  2. HFS prevention: begin moisturizing with Eucerin at least BID applied to hands and feet.  3. Mild headache: Renee Houston aware she can take acetaminophen 500 mg PO Q6H PRN but to contact the clinic if she reaches 2,000 mg total in a 24 hour period. Advised against use of ibuprofen.  4. Dyspepsia: start famotidine 10 mg PO daily (dose adjusted to renal function). Contact clinic with persistent or worsening symptoms.  5. Nausea: prochlorperazine 10 mg PO Q6H PRN as ordered.  6. Diarrhea: loperamide 4 mg for initial episode of diarrhea followed by 2 mg every 2 hours PRN loose stools (max of 4 capsules in a 24 hour period at which point she should contact the clinic).    Patient's questions were answered and patient verbalized understanding of the plan.    Follow up: 04/23/19 office visit    Subjective & Objective     HPI:  Renee Houston is a 85 y.o. female with metastatic rectal cancer who I spoke with regarding ongoing capecitabine therapy.    BSA: There is no height or weight on file to calculate BSA.  @VS @  Wt Readings from Last 3 Encounters:   11/13/18 56.9 kg (125 lb 6.4 oz)   09/30/18 57.2 kg (126 lb 3.2 oz)   08/18/18 57.2 kg (126 lb 3.2 oz)        No history exists.       Pertinent labs:  Lab Results   Component Value Date    WBC 7.6 03/15/2019    HGB 13.5 03/15/2019    HCT 41.0 03/15/2019    PLT 254 03/15/2019       Lab Results   Component Value Date    NA 143 03/15/2019    K 4.3 03/15/2019    CL 105 03/15/2019    CO2 29.0 03/15/2019    BUN 17 03/15/2019    CREATININE 0.8 03/15/2019    GLU 106 03/15/2019    CALCIUM 9.7 03/15/2019       Lab Results   Component Value Date  BILITOT 0.5 03/15/2019    PROT 7.0 03/15/2019    ALBUMIN 4.4 03/15/2019    ALT 7 03/15/2019    AST 22 03/15/2019    ALKPHOS 72 03/15/2019       Lab Results   Component Value Date    INR 1.10 05/12/2018    APTT 31.9 05/12/2018       Current medications:  Current Outpatient Medications   Medication Sig Dispense Refill   ??? acetaminophen (TYLENOL) 500 MG tablet Take 1,000 mg by mouth every eight (8) hours as needed for pain.      ??? amLODIPine (NORVASC) 2.5 MG tablet Take 2.5 mg by mouth nightly.      ??? blood sugar diagnostic (ONETOUCH ULTRA BLUE TEST STRIP) Strp USE EVERY DAY     ??? capecitabine (XELODA) 500 MG tablet Take 1 tablet (500mg ) by mouth in the morning and 2 tablets (1000mg ) in the evening. 90 tablet 6   ??? doxepin (SINEQUAN) 10 MG capsule Take 10 mg by mouth nightly.     ??? metFORMIN (GLUCOPHAGE) 500 MG tablet Take 500 mg by mouth daily.      ??? ondansetron (ZOFRAN-ODT) 4 MG disintegrating tablet 4 mg.      ??? prochlorperazine (COMPAZINE) 10 MG tablet Take 1 tablet (10 mg total) by mouth every six (6) hours as needed for nausea. 30 tablet 3   ??? telmisartan (MICARDIS) 40 MG tablet Take 40 mg by mouth daily.     ??? UNABLE TO FIND Juice plus(powder)-supplement taken daily with liquid       No current facility-administered medications for this visit.      Coordination and/or care time spent with ReneeMyron: 20 minutes    Konrad Penta, PharmD, BCOP, CPP  Clinical Pharmacist Practitioner, Gastrointestinal Oncology  Pager: [phone]

## 2019-04-13 MED ORDER — FAMOTIDINE 10 MG TABLET
ORAL_TABLET | Freq: Every evening | ORAL | 3 refills | 0.00000 days | Status: CP
Start: 2019-04-13 — End: 2020-04-12

## 2019-04-13 MED ORDER — CAPECITABINE 500 MG TABLET
ORAL_TABLET | 6 refills | 0 days | Status: CP
Start: 2019-04-13 — End: ?

## 2019-04-13 NOTE — Unmapped (Signed)
Contacted [facility] pharmacy for Rx clarification to inform them the capecitabine prescription is intended to be continuous dosing with no off-period. Recently approved by manufacturer assistance program.    Contacted patient and her daughter to reiterate use of Pepcid (famotidine) 10 mg tablets which is an OTC item. Plan to go ahead and send a prescription for this in case it is covered by patient's insurance plan (local [facility] was requested).    Provided patient's daughter with [employer] manufacturer assistance phone number and indicated she should call about 1.5 weeks before needing refills for capecitabine to ensure timely arrival.    Patient/caregiver are aware and was appreciative of the information.    Care coordination: 20 minutes    Konrad Penta, PharmD, BCOP, CPP  Clinical Pharmacist Practitioner, Gastrointestinal Oncology  Pager: [phone]

## 2019-04-14 NOTE — Unmapped (Signed)
Contacted [facility] with [employer]'s manufacturer assistance program. Application for manufacturer assistance that was submitted displayed incorrect strength of capecitabine tablet (intended for 500 mg tablets instead of 150 mg tablets as displayed on the application).    Clarified this with [facility] pharmacy. Confirmed they were able to cancel prescription for 150 mg tablets and routed copy electronically for 500 mg tablets: Take 1 tablet (500mg ) by mouth in the morning and 2 tablets (1000mg ) in the evening.    Care coordination: 10 minutes    Konrad Penta, PharmD, BCOP, CPP  Clinical Pharmacist Practitioner, Gastrointestinal Oncology  Pager: [phone]

## 2019-04-20 NOTE — Unmapped (Signed)
This patient has been disenrolled from the [facility] specialty pharmacy services due to a pharmacy change. The patient is now filling at [facility].  Renee Houston is receiving her medication directly from [facility] until 04/06/20.    Kermit Balo  [facility] Specialty Pharmacist

## 2019-04-20 NOTE — Unmapped (Signed)
Telephone call to patient and daughters numbers to confirm appointment on 7/8 in [place] to complete ACP documents. No voice mail for either number.

## 2019-04-21 ENCOUNTER — Ambulatory Visit: Admit: 2019-04-21 | Discharge: 2019-04-22 | Payer: MEDICARE

## 2019-04-21 DIAGNOSIS — C20 Malignant neoplasm of rectum: Principal | ICD-10-CM

## 2019-04-23 ENCOUNTER — Telehealth
Admit: 2019-04-23 | Discharge: 2019-04-24 | Payer: MEDICARE | Attending: Hematology & Oncology | Primary: Hematology & Oncology

## 2019-04-23 DIAGNOSIS — C20 Malignant neoplasm of rectum: Principal | ICD-10-CM

## 2019-05-06 ENCOUNTER — Telehealth
Admit: 2019-05-06 | Discharge: 2019-05-07 | Payer: MEDICARE | Attending: Student in an Organized Health Care Education/Training Program | Primary: Student in an Organized Health Care Education/Training Program

## 2019-05-06 DIAGNOSIS — C20 Malignant neoplasm of rectum: Secondary | ICD-10-CM

## 2019-05-06 DIAGNOSIS — Z515 Encounter for palliative care: Secondary | ICD-10-CM

## 2019-05-06 DIAGNOSIS — F419 Anxiety disorder, unspecified: Secondary | ICD-10-CM

## 2019-05-06 DIAGNOSIS — C7800 Secondary malignant neoplasm of unspecified lung: Principal | ICD-10-CM

## 2019-05-20 ENCOUNTER — Ambulatory Visit: Admit: 2019-05-20 | Discharge: 2019-05-21 | Payer: MEDICARE

## 2019-05-20 DIAGNOSIS — C20 Malignant neoplasm of rectum: Principal | ICD-10-CM

## 2019-05-21 DIAGNOSIS — C7800 Secondary malignant neoplasm of unspecified lung: Secondary | ICD-10-CM

## 2019-05-21 DIAGNOSIS — Z09 Encounter for follow-up examination after completed treatment for conditions other than malignant neoplasm: Secondary | ICD-10-CM

## 2019-05-21 DIAGNOSIS — R110 Nausea: Secondary | ICD-10-CM

## 2019-05-21 DIAGNOSIS — H5789 Other specified disorders of eye and adnexa: Secondary | ICD-10-CM

## 2019-05-21 DIAGNOSIS — T451X5A Adverse effect of antineoplastic and immunosuppressive drugs, initial encounter: Secondary | ICD-10-CM

## 2019-05-21 DIAGNOSIS — C20 Malignant neoplasm of rectum: Principal | ICD-10-CM

## 2019-05-21 MED ORDER — PREDNISOLONE ACETATE 1 % EYE DROPS,SUSPENSION
Freq: Three times a day (TID) | OPHTHALMIC | 0 refills | 34 days | Status: CP
Start: 2019-05-21 — End: ?

## 2019-05-22 ENCOUNTER — Telehealth: Admit: 2019-05-22 | Discharge: 2019-05-22 | Payer: MEDICARE

## 2019-06-14 ENCOUNTER — Ambulatory Visit: Admit: 2019-06-14 | Discharge: 2019-06-14 | Payer: MEDICARE

## 2019-06-14 DIAGNOSIS — C20 Malignant neoplasm of rectum: Principal | ICD-10-CM

## 2019-06-18 ENCOUNTER — Telehealth
Admit: 2019-06-18 | Discharge: 2019-06-19 | Payer: MEDICARE | Attending: Hematology & Oncology | Primary: Hematology & Oncology

## 2019-06-22 MED ORDER — CETIRIZINE 10 MG TABLET
ORAL_TABLET | Freq: Every day | ORAL | 2 refills | 30 days | Status: CP
Start: 2019-06-22 — End: 2020-06-21

## 2019-06-23 ENCOUNTER — Telehealth
Admit: 2019-06-23 | Discharge: 2019-06-24 | Payer: MEDICARE | Attending: Student in an Organized Health Care Education/Training Program | Primary: Student in an Organized Health Care Education/Training Program

## 2019-06-23 DIAGNOSIS — C20 Malignant neoplasm of rectum: Secondary | ICD-10-CM

## 2019-06-23 DIAGNOSIS — Z7189 Other specified counseling: Secondary | ICD-10-CM

## 2019-06-23 DIAGNOSIS — Z515 Encounter for palliative care: Secondary | ICD-10-CM

## 2019-06-23 DIAGNOSIS — F329 Major depressive disorder, single episode, unspecified: Secondary | ICD-10-CM

## 2019-06-23 MED ORDER — MIRTAZAPINE 7.5 MG TABLET
ORAL_TABLET | Freq: Every evening | ORAL | 3 refills | 90 days | Status: CP
Start: 2019-06-23 — End: 2019-07-23

## 2019-06-24 DIAGNOSIS — C7800 Secondary malignant neoplasm of unspecified lung: Secondary | ICD-10-CM

## 2019-06-24 DIAGNOSIS — C20 Malignant neoplasm of rectum: Secondary | ICD-10-CM

## 2019-07-16 ENCOUNTER — Emergency Department: Payer: Medicare Other

## 2019-07-16 ENCOUNTER — Inpatient Hospital Stay
Admission: EM | Admit: 2019-07-16 | Discharge: 2019-07-17 | DRG: 065 | Disposition: A | Discharge status: Home / Self Care | Payer: Medicare Other | Source: Ambulatory Visit | Attending: Internal Medicine | Admitting: Internal Medicine

## 2019-07-16 ENCOUNTER — Other Ambulatory Visit: Payer: Self-pay

## 2019-07-16 DIAGNOSIS — R29700 NIHSS score 0: Secondary | ICD-10-CM | POA: Diagnosis present

## 2019-07-16 DIAGNOSIS — Q211 Atrial septal defect: Secondary | ICD-10-CM

## 2019-07-16 DIAGNOSIS — Z885 Allergy status to narcotic agent status: Secondary | ICD-10-CM

## 2019-07-16 DIAGNOSIS — Z8673 Personal history of transient ischemic attack (TIA), and cerebral infarction without residual deficits: Secondary | ICD-10-CM

## 2019-07-16 DIAGNOSIS — H53461 Homonymous bilateral field defects, right side: Secondary | ICD-10-CM | POA: Diagnosis present

## 2019-07-16 DIAGNOSIS — E785 Hyperlipidemia, unspecified: Secondary | ICD-10-CM | POA: Diagnosis present

## 2019-07-16 DIAGNOSIS — I639 Cerebral infarction, unspecified: Secondary | ICD-10-CM | POA: Diagnosis present

## 2019-07-16 DIAGNOSIS — K219 Gastro-esophageal reflux disease without esophagitis: Secondary | ICD-10-CM | POA: Diagnosis present

## 2019-07-16 DIAGNOSIS — Z823 Family history of stroke: Secondary | ICD-10-CM

## 2019-07-16 DIAGNOSIS — Z20828 Contact with and (suspected) exposure to other viral communicable diseases: Secondary | ICD-10-CM | POA: Diagnosis present

## 2019-07-16 DIAGNOSIS — Z7984 Long term (current) use of oral hypoglycemic drugs: Secondary | ICD-10-CM

## 2019-07-16 DIAGNOSIS — H53489 Generalized contraction of visual field, unspecified eye: Secondary | ICD-10-CM | POA: Diagnosis not present

## 2019-07-16 DIAGNOSIS — Z880 Allergy status to penicillin: Secondary | ICD-10-CM

## 2019-07-16 DIAGNOSIS — E119 Type 2 diabetes mellitus without complications: Secondary | ICD-10-CM

## 2019-07-16 DIAGNOSIS — Z888 Allergy status to other drugs, medicaments and biological substances status: Secondary | ICD-10-CM

## 2019-07-16 DIAGNOSIS — I63532 Cerebral infarction due to unspecified occlusion or stenosis of left posterior cerebral artery: Secondary | ICD-10-CM | POA: Diagnosis not present

## 2019-07-16 DIAGNOSIS — I10 Essential (primary) hypertension: Secondary | ICD-10-CM | POA: Diagnosis present

## 2019-07-16 HISTORY — DX: Essential (primary) hypertension: I10

## 2019-07-16 HISTORY — DX: Gastro-esophageal reflux disease without esophagitis: K21.9

## 2019-07-16 LAB — CBC WITH DIFFERENTIAL/PLATELET
Abs Immature Granulocytes: 0.03 10*3/uL (ref 0.00–0.07)
Basophils Absolute: 0.1 10*3/uL (ref 0.0–0.1)
Basophils Relative: 1 %
Eosinophils Absolute: 0.1 10*3/uL (ref 0.0–0.5)
Eosinophils Relative: 1 %
HCT: 39.3 % (ref 36.0–46.0)
Hemoglobin: 12.9 g/dL (ref 12.0–15.0)
Immature Granulocytes: 0 %
Lymphocytes Relative: 20 %
Lymphs Abs: 1.7 10*3/uL (ref 0.7–4.0)
MCH: 33.5 pg (ref 26.0–34.0)
MCHC: 32.8 g/dL (ref 30.0–36.0)
MCV: 102.1 fL — ABNORMAL HIGH (ref 80.0–100.0)
Monocytes Absolute: 0.6 10*3/uL (ref 0.1–1.0)
Monocytes Relative: 8 %
Neutro Abs: 5.8 10*3/uL (ref 1.7–7.7)
Neutrophils Relative %: 70 %
Platelets: 276 10*3/uL (ref 150–400)
RBC: 3.85 MIL/uL — ABNORMAL LOW (ref 3.87–5.11)
RDW: 16.7 % — ABNORMAL HIGH (ref 11.5–15.5)
WBC: 8.3 10*3/uL (ref 4.0–10.5)
nRBC: 0 % (ref 0.0–0.2)

## 2019-07-16 LAB — COMPREHENSIVE METABOLIC PANEL
ALT: 8 U/L (ref 0–44)
AST: 16 U/L (ref 15–41)
Albumin: 4.1 g/dL (ref 3.5–5.0)
Alkaline Phosphatase: 67 U/L (ref 38–126)
Anion gap: 10 (ref 5–15)
BUN: 18 mg/dL (ref 8–23)
CO2: 29 mmol/L (ref 22–32)
Calcium: 9.4 mg/dL (ref 8.9–10.3)
Chloride: 102 mmol/L (ref 98–111)
Creatinine, Ser: 0.85 mg/dL (ref 0.44–1.00)
GFR calc Af Amer: >60 mL/min (ref >60)
GFR calc non Af Amer: 59 mL/min — ABNORMAL LOW (ref >60)
Glucose, Bld: 112 mg/dL — ABNORMAL HIGH (ref 70–99)
Potassium: 4.1 mmol/L (ref 3.5–5.1)
Sodium: 141 mmol/L (ref 135–145)
Total Bilirubin: 0.7 mg/dL (ref 0.3–1.2)
Total Protein: 7 g/dL (ref 6.5–8.1)

## 2019-07-16 LAB — C-REACTIVE PROTEIN: CRP: <0.8 mg/dL (ref <1.0)

## 2019-07-16 LAB — SEDIMENTATION RATE: Sed Rate: 7 mm/hr (ref 0–30)

## 2019-07-16 LAB — TROPONIN I (HIGH SENSITIVITY): Troponin I (High Sensitivity): 16 ng/L (ref <18)

## 2019-07-16 MED ORDER — FLUORESCEIN SODIUM 1 MG OP STRP: 1.0000 | ORAL_STRIP | Freq: Once | OPHTHALMIC | Status: DC

## 2019-07-16 MED ORDER — PROPARACAINE HCL 0.5 % OP SOLN
1.0000 [drp] | Freq: Once | OPHTHALMIC | Status: DC
  Filled 2019-07-16: qty 15

## 2019-07-16 NOTE — ED Notes (Signed)
Patient transported to MRI 

## 2019-07-16 NOTE — ED Provider Notes (Signed)
[facility] Emergency Department Provider Note   ____________________________________________   First MD Initiated Contact with Patient 07/16/19 2111     (approximate)  I have reviewed the triage vital signs and the nursing notes.   HISTORY  Chief Complaint Loss of Vision    HPI Destiny [name] is a 85 y.o. female with past medical history of hypertension, hyperlipidemia, diabetes, and stroke who presents to the ED with vision change.  Patient reports that shortly after she woke up this morning she developed pain behind her right eye as well as a black area in her vision.  She denies any associated speech changes, numbness, or weakness.  She was evaluated by her ophthalmologist earlier in the day today, and was told that her eye exam was normal.  She states that they took pictures of the back of her eye and also check the pressure in her eye with everything appearing normal.  She was then referred to the ED for further evaluation for possible stroke.  She states that my pain is improved but she continues to have difficulty with that her right visual field.        Past Medical History:  Diagnosis Date  . Allergy   . Cancer ([place]) 03/08/2018  . Diabetes mellitus without complication ([place])   . GERD (gastroesophageal reflux disease)   . HTN (hypertension)   . Hyperlipidemia   . Shingles   . Stroke [facility])     Patient Active Problem List   Diagnosis Date Noted  . DDD (degenerative disc disease), thoracic 04/09/2018  . Malignant neoplasm of rectum ([place]) 04/08/2018  . Other specified dorsopathies, sacral and sacrococcygeal region 04/08/2018  . Pharmacologic therapy 04/08/2018  . Disorder of skeletal system 04/08/2018  . Problems influencing health status 04/08/2018  . Thoracic radiculitis (Right) 04/08/2018  . Acute low back pain without sciatica 04/08/2018  . Acute upper back pain (Right) 04/08/2018  . Goals of care, counseling/discussion 03/13/2018  .  Benign neoplasm of transverse colon   . Polyp of sigmoid colon   . Intestinal lump   . Diverticulosis of large intestine without diverticulitis   . Internal hemorrhoids   . Nodule of rectum   . Intestinal neoplasm   . Hematochezia   . GI bleed 03/03/2018  . Lower GI bleed 03/02/2018  . Spondylosis without myelopathy or radiculopathy, lumbosacral region 01/29/2018  . Trigger point with back pain (buttocks area) (Right) 11/18/2017  . Groin pain (Right) 11/18/2017  . Trochanteric bursitis of hip (Right) 10/20/2017  . Lumbar facet syndrome (Bilateral) 09/18/2017  . Osteoarthritis of sacroiliac joints (Bilateral) 09/17/2017  . Osteoarthritis of hip (Bilateral) 09/17/2017  . Osteoarthritis of lumbar spine 09/17/2017  . Lumbar spondylosis 09/17/2017  . Long term current use of anticoagulant (Plavix) 09/17/2017  . DDD (degenerative disc disease), lumbar 09/17/2017  . Lumbar facet hypertrophy 09/17/2017  . Chronic low back pain (Primary Area of Pain) (Bilateral) (L>R) 09/08/2017  . Chronic sacroiliac joint pain (Bilateral) (L>R) 09/08/2017  . Chronic hip pain (Bilateral) (L>R) 09/08/2017  . Chronic pain syndrome 09/08/2017  . Diabetes mellitus type 2, uncomplicated ([place]) [id number]  . CVA (cerebral vascular accident) ([place]) 07/30/2017  . Chronic myofascial pain 08/01/2016  . Esophageal reflux 03/01/2014  . Benign essential hypertension 03/01/2014  . Migraine 03/01/2014  . HLD (hyperlipidemia) 03/01/2014  . Type II or unspecified type diabetes mellitus without mention of complication, not stated as uncontrolled 03/01/2014  . Pseudophakia, both eyes 10/30/2012  . 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 APPENDECTOMY    . BACK SURGERY    . COLONOSCOPY WITH PROPOFOL N/A 03/06/2018   Procedure: COLONOSCOPY WITH PROPOFOL;  Surgeon: Virgel Manifold, MD;  Location: [facility] ENDOSCOPY;  Service: Endoscopy;  Laterality: N/A;  . FLEXIBLE  SIGMOIDOSCOPY Left 03/04/2018   Procedure: Colonoscopy/FLEXIBLE SIGMOIDOSCOPY;  Surgeon: Virgel Manifold, MD;  Location: [facility] ENDOSCOPY;  Service: Endoscopy;  Laterality: Left;  . FOOT SURGERY Bilateral    [place]  . JOINT REPLACEMENT    . KNEE SURGERY Bilateral   . TONSILECTOMY, ADENOIDECTOMY, BILATERAL MYRINGOTOMY AND TUBES      Prior to Admission medications   Medication Sig Start Date End Date Taking? Authorizing Provider  acetaminophen (TYLENOL) 325 MG tablet Take 2 tablets (650 mg total) by mouth every 6 (six) hours as needed for mild pain (or Fever >/= 101). 03/07/18   Loletha Grayer, MD  amLODipine (NORVASC) 2.5 MG tablet Take 1 tablet (2.5 mg total) by mouth daily. Take once daily if blood pressure > 160 10/04/16 02/03/25  Merlyn Lot, MD  cholecalciferol (VITAMIN D) 1000 units tablet Take 1,000 Units by mouth daily.    [provider]  doxepin (SINEQUAN) 10 MG capsule TAKE ONE CAPSULE BY MOUTH EVERY NIGHT AT BEDTIME 07/30/16   [provider]  ferrous sulfate 325 (65 FE) MG tablet Take 1 tablet (325 mg total) by mouth 2 (two) times daily with a meal. 03/07/18   Leslye Peer, Richard, MD  metFORMIN (GLUCOPHAGE) 500 MG tablet TAKE ONE (1) TABLET BY MOUTH EVERY DAY 07/01/16   [provider]  polyethylene glycol (MIRALAX / GLYCOLAX) packet Take 17 g by mouth daily as needed for mild constipation or moderate constipation.    [provider]  telmisartan (MICARDIS) 80 MG tablet Take 1 tablet by mouth daily. 07/25/17   [provider]  vitamin B-12 (CYANOCOBALAMIN) 1000 MCG tablet Take 1,000 mcg by mouth daily.    [provider]    Allergies Penicillins, Ampicillin, Hydrocodone-homatropine, Amoxicillin, Citalopram, and Tramadol  Family History  Problem Relation Age of Onset  . Stroke Mother   . Stroke Father     Social History Social History   Tobacco Use  . Smoking status: Never Smoker  . Smokeless tobacco: Never  Used  Substance Use Topics  . Alcohol use: No  . Drug use: No    Review of Systems  Constitutional: No fever/chills Eyes: Positive for vision change. ENT: No sore throat. Cardiovascular: Denies chest pain. Respiratory: Denies shortness of breath. Gastrointestinal: No abdominal pain.  No nausea, no vomiting.  No diarrhea.  No constipation. Genitourinary: Negative for dysuria. Musculoskeletal: Negative for back pain. Skin: Negative for rash. Neurological: Negative for headaches, focal weakness or numbness.  ____________________________________________   PHYSICAL EXAM:  VITAL SIGNS: ED Triage Vitals  Enc Vitals Group     BP 07/16/19 1638 (!) 165/69     Pulse Rate 07/16/19 1638 79     Resp 07/16/19 1638 18     Temp 07/16/19 1638 98.3 F (36.8 C)     Temp Source 07/16/19 1638 Oral     SpO2 07/16/19 1638 98 %     Weight 07/16/19 1652 125 lb (56.7 kg)     Height 07/16/19 1652 5' (1.524 m)     Head Circumference --      Peak Flow --      Pain Score 07/16/19 1652 0     Pain Loc --  Pain Edu? --      Excl. in [place]? --     Constitutional: Alert and oriented. Eyes: Conjunctivae are normal.  Pupils dilated but reactive to light, extraocular movements intact.  Visual fields appear intact. Head: Atraumatic. Nose: No congestion/rhinnorhea. Mouth/Throat: Mucous membranes are moist. Neck: Normal ROM Cardiovascular: Normal rate, regular rhythm. Grossly normal heart sounds. Respiratory: Normal respiratory effort.  No retractions. Lungs CTAB. Gastrointestinal: Soft and nontender. No distention. Genitourinary: deferred Musculoskeletal: No lower extremity tenderness nor edema. Neurologic:  Normal speech and language.  No gross focal neurologic deficits are appreciated. Skin:  Skin is warm, dry and intact. No rash noted. Psychiatric: Mood and affect are normal. Speech and behavior are normal.  ____________________________________________   LABS (all labs ordered are listed,  but only abnormal results are displayed)  Labs Reviewed  CBC WITH DIFFERENTIAL/PLATELET - Abnormal; Notable for the following components:      Result Value   RBC 3.85 (*)    MCV 102.1 (*)    RDW 16.7 (*)    All other components within normal limits  COMPREHENSIVE METABOLIC PANEL - Abnormal; Notable for the following components:   Glucose, Bld 112 (*)    GFR calc non Af Amer 59 (*)    All other components within normal limits  SEDIMENTATION RATE  C-REACTIVE PROTEIN  URINALYSIS, COMPLETE (UACMP) WITH MICROSCOPIC  TROPONIN I (HIGH SENSITIVITY)   ____________________________________________  EKG  ED ECG REPORT I, Blake Divine, the attending physician, personally viewed and interpreted this ECG.   Date: 07/17/2019  EKG Time: 16:45  Rate: 75  Rhythm: normal sinus rhythm  Axis: Normal  Intervals:none  ST&T Change: None    PROCEDURES  Procedure(s) performed (including Critical Care):  Procedures   ____________________________________________   INITIAL IMPRESSION / ASSESSMENT AND PLAN / ED COURSE       85 year old female with history of stroke presenting to the ED with right eye pain and decreased vision since shortly after waking up this morning.  She reportedly had a complete ophthalmologic evaluation earlier today that was unremarkable.  I am unable to find any focal neurologic deficits on exam, however patient's described symptoms are concerning for visual field deficit.  Will check MRI for evidence of stroke.  Patient is well outside the window for TPA, doubt large vessel occlusion.  Patient turned over to Dr. Owens Shark pending MRI results.      ____________________________________________   FINAL CLINICAL IMPRESSION(S) / ED DIAGNOSES  Final diagnoses:  Visual field constriction, unspecified laterality     ED Discharge Orders    None       Note:  This document was prepared using Dragon voice recognition software and may include unintentional dictation  errors.   Blake Divine, MD 07/17/19 [phone]

## 2019-07-16 NOTE — ED Notes (Signed)
Daughter called, Phyliss Countryman, [phone],   Reports bad HA over left eye with vision started at between 8-9 am, gave pt naproxen and voltaren gel

## 2019-07-16 NOTE — ED Notes (Signed)
Call from MRI, phone to pt for pre-assess

## 2019-07-16 NOTE — ED Triage Notes (Signed)
Pt to the er sent from the eye doctor for an MRI to rule out a possible stroke. Pt woke up this am with a severe headache and loss of vision in the right eye. Pt denies any weakness in arms or legs, or difficulty swallowing. Pt denies any changes in speech

## 2019-07-17 ENCOUNTER — Observation Stay: Payer: Medicare Other

## 2019-07-17 ENCOUNTER — Other Ambulatory Visit: Payer: Self-pay

## 2019-07-17 ENCOUNTER — Encounter: Payer: Self-pay | Admitting: Internal Medicine

## 2019-07-17 ENCOUNTER — Observation Stay (HOSPITAL_COMMUNITY)
Admit: 2019-07-17 | Discharge: 2019-07-17 | Disposition: A | Discharge status: Home / Self Care | Payer: Medicare Other | Attending: Internal Medicine | Admitting: Internal Medicine

## 2019-07-17 DIAGNOSIS — Z7984 Long term (current) use of oral hypoglycemic drugs: Secondary | ICD-10-CM | POA: Diagnosis not present

## 2019-07-17 DIAGNOSIS — R29700 NIHSS score 0: Secondary | ICD-10-CM | POA: Diagnosis present

## 2019-07-17 DIAGNOSIS — I340 Nonrheumatic mitral (valve) insufficiency: Secondary | ICD-10-CM | POA: Diagnosis not present

## 2019-07-17 DIAGNOSIS — I361 Nonrheumatic tricuspid (valve) insufficiency: Secondary | ICD-10-CM | POA: Diagnosis not present

## 2019-07-17 DIAGNOSIS — Z20828 Contact with and (suspected) exposure to other viral communicable diseases: Secondary | ICD-10-CM | POA: Diagnosis present

## 2019-07-17 DIAGNOSIS — Q211 Atrial septal defect: Secondary | ICD-10-CM | POA: Diagnosis not present

## 2019-07-17 DIAGNOSIS — I639 Cerebral infarction, unspecified: Secondary | ICD-10-CM | POA: Diagnosis not present

## 2019-07-17 DIAGNOSIS — I63532 Cerebral infarction due to unspecified occlusion or stenosis of left posterior cerebral artery: Secondary | ICD-10-CM | POA: Diagnosis present

## 2019-07-17 DIAGNOSIS — Z823 Family history of stroke: Secondary | ICD-10-CM | POA: Diagnosis not present

## 2019-07-17 DIAGNOSIS — K219 Gastro-esophageal reflux disease without esophagitis: Secondary | ICD-10-CM | POA: Diagnosis present

## 2019-07-17 DIAGNOSIS — E785 Hyperlipidemia, unspecified: Secondary | ICD-10-CM | POA: Diagnosis present

## 2019-07-17 DIAGNOSIS — Z888 Allergy status to other drugs, medicaments and biological substances status: Secondary | ICD-10-CM | POA: Diagnosis not present

## 2019-07-17 DIAGNOSIS — Z880 Allergy status to penicillin: Secondary | ICD-10-CM | POA: Diagnosis not present

## 2019-07-17 DIAGNOSIS — H53461 Homonymous bilateral field defects, right side: Secondary | ICD-10-CM | POA: Diagnosis present

## 2019-07-17 DIAGNOSIS — H53489 Generalized contraction of visual field, unspecified eye: Secondary | ICD-10-CM | POA: Diagnosis present

## 2019-07-17 DIAGNOSIS — I10 Essential (primary) hypertension: Secondary | ICD-10-CM | POA: Diagnosis present

## 2019-07-17 DIAGNOSIS — E119 Type 2 diabetes mellitus without complications: Secondary | ICD-10-CM | POA: Diagnosis present

## 2019-07-17 DIAGNOSIS — Z8673 Personal history of transient ischemic attack (TIA), and cerebral infarction without residual deficits: Secondary | ICD-10-CM | POA: Diagnosis not present

## 2019-07-17 DIAGNOSIS — Z885 Allergy status to narcotic agent status: Secondary | ICD-10-CM | POA: Diagnosis not present

## 2019-07-17 LAB — LIPID PANEL
Cholesterol: 195 mg/dL (ref 0–200)
HDL: 76 mg/dL (ref >40)
LDL Cholesterol: 106 mg/dL — ABNORMAL HIGH (ref 0–99)
Total CHOL/HDL Ratio: 2.6 RATIO
Triglycerides: 64 mg/dL (ref <150)
VLDL: 13 mg/dL (ref 0–40)

## 2019-07-17 LAB — CBC
HCT: 38.7 % (ref 36.0–46.0)
Hemoglobin: 12.9 g/dL (ref 12.0–15.0)
MCH: 33.5 pg (ref 26.0–34.0)
MCHC: 33.3 g/dL (ref 30.0–36.0)
MCV: 100.5 fL — ABNORMAL HIGH (ref 80.0–100.0)
Platelets: 270 10*3/uL (ref 150–400)
RBC: 3.85 MIL/uL — ABNORMAL LOW (ref 3.87–5.11)
RDW: 16.1 % — ABNORMAL HIGH (ref 11.5–15.5)
WBC: 9.3 10*3/uL (ref 4.0–10.5)
nRBC: 0 % (ref 0.0–0.2)

## 2019-07-17 LAB — URINALYSIS, COMPLETE (UACMP) WITH MICROSCOPIC
Bacteria, UA: NONE SEEN
Bilirubin Urine: NEGATIVE
Glucose, UA: NEGATIVE mg/dL
Hgb urine dipstick: NEGATIVE
Ketones, ur: NEGATIVE mg/dL
Nitrite: NEGATIVE
Protein, ur: NEGATIVE mg/dL
Specific Gravity, Urine: 1.016 (ref 1.005–1.030)
Squamous Epithelial / HPF: NONE SEEN (ref 0–5)
pH: 6 (ref 5.0–8.0)

## 2019-07-17 LAB — CREATININE, SERUM
Creatinine, Ser: 0.71 mg/dL (ref 0.44–1.00)
GFR calc Af Amer: >60 mL/min (ref >60)
GFR calc non Af Amer: >60 mL/min (ref >60)

## 2019-07-17 LAB — HEMOGLOBIN A1C
Hgb A1c MFr Bld: 6.5 % — ABNORMAL HIGH (ref 4.8–5.6)
Mean Plasma Glucose: 139.85 mg/dL

## 2019-07-17 LAB — GLUCOSE, CAPILLARY: Glucose-Capillary: 115 mg/dL — ABNORMAL HIGH (ref 70–99)

## 2019-07-17 LAB — SARS CORONAVIRUS 2 (TAT 6-24 HRS): SARS Coronavirus 2: NEGATIVE

## 2019-07-17 MED ORDER — ACETAMINOPHEN 325 MG PO TABS
650.0000 mg | ORAL_TABLET | ORAL | Status: DC | PRN
  Administered 2019-07-17: 03:00:00 650 mg via ORAL
  Filled 2019-07-17: qty 2

## 2019-07-17 MED ORDER — ASPIRIN 81 MG PO CHEW
324.0000 mg | CHEWABLE_TABLET | Freq: Once | ORAL | Status: AC
  Administered 2019-07-17: 324 mg via ORAL

## 2019-07-17 MED ORDER — ENOXAPARIN SODIUM 40 MG/0.4ML ~~LOC~~ SOLN
40.0000 mg | Freq: Every day | SUBCUTANEOUS | Status: DC
  Administered 2019-07-17: 03:00:00 40 mg via SUBCUTANEOUS
  Filled 2019-07-17: qty 0.4

## 2019-07-17 MED ORDER — AMLODIPINE BESYLATE 5 MG PO TABS
2.5000 mg | ORAL_TABLET | Freq: Every day | ORAL | Status: DC
  Administered 2019-07-17: 2.5 mg via ORAL
  Filled 2019-07-17: qty 1

## 2019-07-17 MED ORDER — STROKE: EARLY STAGES OF RECOVERY BOOK
Freq: Once | Status: AC
  Administered 2019-07-17: 03:00:00

## 2019-07-17 MED ORDER — INSULIN ASPART 100 UNIT/ML ~~LOC~~ SOLN: 0.0000 [IU] | Freq: Three times a day (TID) | SUBCUTANEOUS | Status: DC

## 2019-07-17 MED ORDER — ASPIRIN 81 MG PO CHEW
CHEWABLE_TABLET | ORAL | Status: AC
  Administered 2019-07-17: 324 mg via ORAL
  Filled 2019-07-17: qty 4

## 2019-07-17 MED ORDER — ATORVASTATIN CALCIUM 20 MG PO TABS: 40.0000 mg | ORAL_TABLET | Freq: Every day | ORAL | Status: DC

## 2019-07-17 MED ORDER — ACETAMINOPHEN 650 MG RE SUPP: 650.0000 mg | RECTAL | Status: DC | PRN

## 2019-07-17 MED ORDER — INSULIN ASPART 100 UNIT/ML ~~LOC~~ SOLN: 0.0000 [IU] | Freq: Every day | SUBCUTANEOUS | Status: DC

## 2019-07-17 MED ORDER — ASPIRIN 81 MG PO CHEW
81.0000 mg | CHEWABLE_TABLET | Freq: Every day | ORAL | Status: DC
  Administered 2019-07-17: 10:00:00 81 mg via ORAL
  Filled 2019-07-17: qty 1

## 2019-07-17 MED ORDER — IRBESARTAN 150 MG PO TABS
300.0000 mg | ORAL_TABLET | Freq: Every day | ORAL | Status: DC
  Administered 2019-07-17: 10:00:00 300 mg via ORAL
  Filled 2019-07-17: qty 2

## 2019-07-17 MED ORDER — GADOBUTROL 1 MMOL/ML IV SOLN
5.0000 mL | Freq: Once | INTRAVENOUS | Status: AC | PRN
  Administered 2019-07-17: 5 mL via INTRAVENOUS

## 2019-07-17 MED ORDER — MIRTAZAPINE 15 MG PO TABS: 15.0000 mg | ORAL_TABLET | Freq: Every day | ORAL | Status: DC

## 2019-07-17 MED ORDER — ACETAMINOPHEN 160 MG/5ML PO SOLN
650.0000 mg | ORAL | Status: DC | PRN
  Filled 2019-07-17: qty 20.3

## 2019-07-17 MED ORDER — SODIUM CHLORIDE 0.9% FLUSH
3.0000 mL | Freq: Two times a day (BID) | INTRAVENOUS | Status: DC
  Administered 2019-07-17: 3 mL via INTRAVENOUS

## 2019-07-17 MED ORDER — ATORVASTATIN CALCIUM 40 MG PO TABS: 40.0000 mg | ORAL_TABLET | Freq: Every day | ORAL | 0 refills | Status: DC

## 2019-07-17 MED ORDER — ASPIRIN 81 MG PO CHEW: 81.0000 mg | CHEWABLE_TABLET | Freq: Every day | ORAL | Status: AC

## 2019-07-17 NOTE — ED Notes (Signed)
ED Provider at bedside discussing POC  Family given water

## 2019-07-17 NOTE — ED Notes (Signed)
ED TO INPATIENT HANDOFF REPORT  ED Nurse Name and Phone #: Ena Dawley [phone] Name/Age/Gender Destiny [name] 85 y.o. female Room/Bed: ED13A/ED13A  Code Status   Code Status: Prior  Home/SNF/Other Home Patient oriented to: self, place, time and situation Is this baseline? Yes   Triage Complete: Triage complete  Chief Complaint headache, vision loss-sent by dr.  Triage Note Pt to the er sent from the eye doctor for an MRI to rule out a possible stroke. Pt woke up this am with a severe headache and loss of vision in the right eye. Pt denies any weakness in arms or legs, or difficulty swallowing. Pt denies any changes in speech   Allergies Allergies  Allergen Reactions  . Penicillins     Has patient had a PCN reaction causing immediate rash, facial/tongue/throat swelling, SOB or lightheadedness with hypotension: Unknown Has patient had a PCN reaction causing severe rash involving mucus membranes or skin necrosis: Unknown Has patient had a PCN reaction that required hospitalization: Unknown Has patient had a PCN reaction occurring within the last 10 years: No If all of the above answers are "NO", then may proceed with Cephalosporin use.   . Ampicillin Rash  . Hydrocodone-Homatropine     Reaction: tachycardia  . Amoxicillin Rash  . Citalopram Diarrhea  . Tramadol Itching    Level of Care/Admitting Diagnosis ED Disposition    ED Disposition Condition [facility] Area: [place] [[id number]]  Level of Care: Med-Surg [16]  Covid Evaluation: Asymptomatic Screening Protocol (No Symptoms)  [facility]: [id number]  Admitting Physician: Lance Coon [id number]  Attending Physician: Lance Coon [id number]  PT Class (Do Not Modify): Observation [104]  PT Acc Code (Do Not Modify): Observation [10022]       B Medical/Surgery History Past Medical History:  Diagnosis Date  . Allergy   . Cancer ([place]) 03/08/2018  . Diabetes mellitus  without complication ([place])   . GERD (gastroesophageal reflux disease)   . HTN (hypertension)   . Hyperlipidemia   . Shingles   . Stroke [employer] [facility])    Past Surgical History:  Procedure Laterality Date  . APPENDECTOMY    . BACK SURGERY    . COLONOSCOPY WITH PROPOFOL N/A 03/06/2018   Procedure: COLONOSCOPY WITH PROPOFOL;  Surgeon: Virgel Manifold, MD;  Location: [facility] ENDOSCOPY;  Service: Endoscopy;  Laterality: N/A;  . FLEXIBLE SIGMOIDOSCOPY Left 03/04/2018   Procedure: Colonoscopy/FLEXIBLE SIGMOIDOSCOPY;  Surgeon: Virgel Manifold, MD;  Location: [facility] ENDOSCOPY;  Service: Endoscopy;  Laterality: Left;  . FOOT SURGERY Bilateral    [place]  . JOINT REPLACEMENT    . KNEE SURGERY Bilateral   . TONSILECTOMY, ADENOIDECTOMY, BILATERAL MYRINGOTOMY AND TUBES       A IV Location/Drains/Wounds Patient Lines/Drains/Airways Status   Active Line/Drains/Airways    Name:   Placement date:   Placement time:   Site:   Days:   Peripheral IV 07/17/19 Right Forearm   07/17/19    0040    Forearm   less than 1          Intake/Output Last 24 hours No intake or output data in the 24 hours ending 07/17/19 0143  Labs/Imaging Results for orders placed or performed during the hospital encounter of 07/16/19 (from the past 48 hour(s))  CBC with Differential/Platelet     Status: Abnormal   Collection Time: 07/16/19  4:41 PM  Result Value Ref Range   WBC 8.3 4.0 - 10.5 K/uL   RBC  3.85 (L) 3.87 - 5.11 MIL/uL   Hemoglobin 12.9 12.0 - 15.0 g/dL   HCT 39.3 36.0 - 46.0 %   MCV 102.1 (H) 80.0 - 100.0 fL   MCH 33.5 26.0 - 34.0 pg   MCHC 32.8 30.0 - 36.0 g/dL   RDW 16.7 (H) 11.5 - 15.5 %   Platelets 276 150 - 400 K/uL   nRBC 0.0 0.0 - 0.2 %   Neutrophils Relative % 70 %   Neutro Abs 5.8 1.7 - 7.7 K/uL   Lymphocytes Relative 20 %   Lymphs Abs 1.7 0.7 - 4.0 K/uL   Monocytes Relative 8 %   Monocytes Absolute 0.6 0.1 - 1.0 K/uL   Eosinophils Relative 1 %   Eosinophils Absolute 0.1 0.0 - 0.5 K/uL    Basophils Relative 1 %   Basophils Absolute 0.1 0.0 - 0.1 K/uL   Immature Granulocytes 0 %   Abs Immature Granulocytes 0.03 0.00 - 0.07 K/uL    Comment: Performed at [facility], [address] [place], [place] [address]  Comprehensive metabolic panel     Status: Abnormal   Collection Time: 07/16/19  4:41 PM  Result Value Ref Range   Sodium 141 135 - 145 mmol/L   Potassium 4.1 3.5 - 5.1 mmol/L   Chloride 102 98 - 111 mmol/L   CO2 29 22 - 32 mmol/L   Glucose, Bld 112 (H) 70 - 99 mg/dL   BUN 18 8 - 23 mg/dL   Creatinine, Ser 0.85 0.44 - 1.00 mg/dL   Calcium 9.4 8.9 - 10.3 mg/dL   Total Protein 7.0 6.5 - 8.1 g/dL   Albumin 4.1 3.5 - 5.0 g/dL   AST 16 15 - 41 U/L   ALT 8 0 - 44 U/L   Alkaline Phosphatase 67 38 - 126 U/L   Total Bilirubin 0.7 0.3 - 1.2 mg/dL   GFR calc non Af Amer 59 (L) >60 mL/min   GFR calc Af Amer >60 >60 mL/min   Anion gap 10 5 - 15    Comment: Performed at [facility], [address] [place], [place] [address]  Sedimentation rate     Status: None   Collection Time: 07/16/19  4:41 PM  Result Value Ref Range   Sed Rate 7 0 - 30 mm/hr    Comment: Performed at [facility], [address] [place], [place] [address]  C-reactive protein     Status: None   Collection Time: 07/16/19  4:41 PM  Result Value Ref Range   CRP <0.8 <1.0 mg/dL    Comment: Performed at [facility], [place] [address] [place], [place] [address]  Troponin I (High Sensitivity)     Status: None   Collection Time: 07/16/19  4:41 PM  Result Value Ref Range   Troponin I (High Sensitivity) 16 <18 ng/L    Comment: (NOTE) Elevated high sensitivity troponin I (hsTnI) values and significant  changes across serial measurements may suggest ACS but many other  chronic and acute conditions are known to elevate hsTnI results.  Refer to the "Links" section for chest pain algorithms and additional  guidance. Performed at [facility], [place], [place] [address]    Ct Head Wo Contrast  Result Date: 07/16/2019 CLINICAL DATA:  Acute, severe headache, the worst of the patient's life. Vision loss in the right eye. EXAM: CT HEAD WITHOUT CONTRAST TECHNIQUE: Contiguous axial images were obtained from the base of the skull  through the vertex without intravenous contrast. COMPARISON:  02/03/2018 FINDINGS: Brain: No significant change in mild-to-moderate enlargement of the ventricles and subarachnoid spaces. Stable mild patchy Polson matter low density in both cerebral hemispheres. Stable old left parietal infarct. Stable small, old bilateral basal ganglia and right thalamic lacunar infarcts. No intracranial hemorrhage, mass lesion or CT evidence of acute infarction. Vascular: No hyperdense vessel or unexpected calcification. Skull: Normal. Negative for fracture or focal lesion. Sinuses/Orbits: Status post bilateral cataract extraction. Unremarkable bones and included paranasal sinuses. Other: None. IMPRESSION: 1. No acute abnormality. 2. Stable atrophy, chronic small vessel Gisler matter ischemic changes and old infarcts, as described above. Electronically Signed   By: Claudie Revering M.D.   On: 07/16/2019 17:04   Mr Brain Wo Contrast  Result Date: 07/17/2019 CLINICAL DATA:  Focal neuro deficit, greater than 6 hours, stroke suspected. Additional history provided: Patient sent to ER from eye doctor for an MRI to rule out a possible stroke. Patient woke up this morning with a severe headache and loss of vision in right eye. EXAM: MRI HEAD WITHOUT CONTRAST TECHNIQUE: Multiplanar, multiecho pulse sequences of the brain and surrounding structures were obtained without intravenous contrast. COMPARISON:  Head CT 07/16/2019, brain MRI 07/30/2017 FINDINGS: Brain: Irregular cortical/subcortical restricted diffusion measuring 2.6 x 1.2 cm in transaxial dimensions within the left occipital lobe consistent with acute infarction. Additional subtle acute cortical  infarction changes are present more medially within the left occipital lobe. Corresponding T2/FLAIR hyperintensity at this site. Additional 5 mm acute subcortical infarct within the high left frontal lobe. Redemonstrated chronic left parietal lobe infarct. Redemonstrated tiny left frontal lobe cortical infarct (series 15, image 40). Mild cerebral Nading matter chronic small vessel ischemic disease. Multiple small T2 hyperintense foci within the bilateral basal ganglia/thalami, likely reflecting a combination of chronic lacunar infarcts and prominent perivascular spaces. Multiple small chronic lacunar infarcts within the bilateral cerebellar hemispheres which were present on prior MRI. New as compared to prior MRI there is a larger region of T2/FLAIR hyperintensity within the right inferior cerebellum without corresponding restricted diffusion, nonspecific but likely reflecting chronic infarct. Bilateral basal ganglia mineralization. Small amount of hemosiderin deposition at site of a chronic left cerebellar lacunar infarct. Mild generalized atrophy. Vascular: Flow voids maintained within the proximal large arterial vessels. Skull and upper cervical spine: Normal marrow signal. Sinuses/Orbits: Visualized orbits demonstrate no acute abnormality. Mild maxillary sinus mucosal thickening. Small bilateral mastoid effusions (greater on the left). Findings of acute infarcts were called by telephone at the time of interpretation on 07/16/2019 at 11:59 pm to provider Dr. Owens Shark, Who verbally acknowledged these results. IMPRESSION: 1. Acute left PCA territory infarct within the left occipital lobe as described. 2. 5 mm acute infarct within the high left frontal lobe subcortical Cueto matter. 3. T2/FLAIR hyperintensity within the inferior right cerebellum new as compared to prior MRI 07/30/2017 without corresponding restricted diffusion, nonspecific but likely reflecting chronic infarct. Multiple additional chronic cerebellar  lacunar infarcts were present on prior exam. 4. Additional chronic cortical and basal ganglia lacunar infarcts as described. 5. Small bilateral mastoid effusions. Electronically Signed   By: Kellie Simmering   On: 07/17/2019 00:02    Pending Labs Unresulted Labs (From admission, onward)    Start     Ordered   07/17/19 0109  SARS CORONAVIRUS 2 (TAT 6-24 HRS) Nasopharyngeal Nasopharyngeal Swab  (Asymptomatic/Tier 2 Patients Labs)  Once,   STAT    Question Answer Comment  Is this test for diagnosis or screening Screening  Symptomatic for COVID-19 as defined by CDC No   Hospitalized for COVID-19 No   Admitted to ICU for COVID-19 No   Previously tested for COVID-19 No   Resident in a congregate (group) care setting No   Employed in healthcare setting No   Pregnant No      07/17/19 0109   07/16/19 1646  Urinalysis, Complete w Microscopic  Once,   STAT     07/16/19 1645   Signed and Held  Hemoglobin A1c  Tomorrow morning,   R     Signed and Held   Signed and Held  Lipid panel  Tomorrow morning,   R    Comments: Fasting    Signed and Held   Signed and Held  CBC  (enoxaparin (LOVENOX)    CrCl >/= 30 ml/min)  Once,   R    Comments: Baseline for enoxaparin therapy IF NOT ALREADY DRAWN.  Notify MD if PLT < 100 K.    Signed and Held   Signed and Held  Creatinine, serum  (enoxaparin (LOVENOX)    CrCl >/= 30 ml/min)  Once,   R    Comments: Baseline for enoxaparin therapy IF NOT ALREADY DRAWN.    Signed and Held   Signed and Held  Creatinine, serum  (enoxaparin (LOVENOX)    CrCl >/= 30 ml/min)  Weekly,   R    Comments: while on enoxaparin therapy    Signed and Held          Vitals/Pain Today's Vitals   07/16/19 2117 07/16/19 2200 07/17/19 0001 07/17/19 0053  BP: (!) 174/74 (!) 153/61  (!) 167/78  Pulse: 75 70  70  Resp: 18 18  18   Temp:      TempSrc:      SpO2: 96% 96%  97%  Weight:      Height:      PainSc: 0-No pain 0-No pain 0-No pain 0-No pain    Isolation Precautions No  active isolations  Medications Medications  aspirin chewable tablet 324 mg (324 mg Oral Given 07/17/19 0018)    Mobility walks Low fall risk   Focused Assessments Neuro Assessment Handoff:  Swallow screen pass? Yes  Cardiac Rhythm: Normal sinus rhythm NIH Stroke Scale ( + Modified Stroke Scale Criteria)  Interval: Shift assessment Level of Consciousness (1a.)   : Alert, keenly responsive LOC Questions (1b. )   +: Answers both questions correctly LOC Commands (1c. )   + : Performs both tasks correctly Best Gaze (2. )  +: Normal Visual (3. )  +: No visual loss Facial Palsy (4. )    : Normal symmetrical movements Motor Arm, Left (5a. )   +: No drift Motor Arm, Right (5b. )   +: No drift Motor Leg, Left (6a. )   +: No drift Motor Leg, Right (6b. )   +: No drift Limb Ataxia (7. ): Absent Sensory (8. )   +: Normal, no sensory loss Best Language (9. )   +: No aphasia Dysarthria (10. ): Normal Extinction/Inattention (11.)   +: No Abnormality Modified SS Total  +: 0 Complete NIHSS TOTAL: 0     Neuro Assessment: Exceptions to WDL Neuro Checks:   Shift assessment (07/16/19 2125)  Last Documented NIHSS Modified Score: 0 (07/16/19 2125) Has TPA been given? No If patient is a Neuro Trauma and patient is going to OR before floor call report to [facility] nurse: [phone] or [phone]     R Recommendations: See  Admitting Provider Note  Report given to:   Additional Notes: daughter at bedside

## 2019-07-17 NOTE — ED Notes (Signed)
Meal provided 

## 2019-07-17 NOTE — Progress Notes (Signed)
Pt had skin tear in right forearm that she and dgt requested dsg change- mepilex dsg with gauze and nonstick wrap applied with wound education/care done. Stroke workup complete. VSS and neuro status stable. Reports visual disturbance in right eye improving. Dgt eager to take pt home asap. Discharge home to self/family care. Will transport pt to private vehicle in transport chair.

## 2019-07-17 NOTE — H&P (Signed)
[facility] NAME: Destiny [name]    MR#:  [id number]  DATE OF BIRTH:  1923/11/15  DATE OF ADMISSION:  07/16/2019  PRIMARY CARE PHYSICIAN: Dion Body, MD   REQUESTING/REFERRING PHYSICIAN: Owens Shark, MD  CHIEF COMPLAINT:   Chief Complaint  Patient presents with  . Loss of Vision    HISTORY OF PRESENT ILLNESS:  Destiny [name]  is a 85 y.o. female who presents with chief complaint as above.  Patient presents the ED with a complaint of left frontal headache and right-sided vision changes.  She states that this all started around 9:00 in the morning.  She did not come to the ED until late in the evening.  She has a history of prior stroke.  MRI tonight shows 2 new strokes, left frontal and left occipital.  She denies any previous diagnosis of A. fib or other arrhythmia.  She does state that when she was diagnosed with a stroke about 10 years ago she was told she had a "hole in her heart".  Patient's visual symptoms are already improving.  Hospitalist were called for admission  PAST MEDICAL HISTORY:   Past Medical History:  Diagnosis Date  . Allergy   . Cancer ([place]) 03/08/2018  . Diabetes mellitus without complication ([place])   . GERD (gastroesophageal reflux disease)   . HTN (hypertension)   . Hyperlipidemia   . Shingles   . Stroke [facility])      PAST SURGICAL HISTORY:   Past Surgical History:  Procedure Laterality Date  . APPENDECTOMY    . BACK SURGERY    . COLONOSCOPY WITH PROPOFOL N/A 03/06/2018   Procedure: COLONOSCOPY WITH PROPOFOL;  Surgeon: Virgel Manifold, MD;  Location: [facility] ENDOSCOPY;  Service: Endoscopy;  Laterality: N/A;  . FLEXIBLE SIGMOIDOSCOPY Left 03/04/2018   Procedure: Colonoscopy/FLEXIBLE SIGMOIDOSCOPY;  Surgeon: Virgel Manifold, MD;  Location: [facility] ENDOSCOPY;  Service: Endoscopy;  Laterality: Left;  . FOOT SURGERY Bilateral    [place]  . JOINT REPLACEMENT    . KNEE SURGERY Bilateral   .  TONSILECTOMY, ADENOIDECTOMY, BILATERAL MYRINGOTOMY AND TUBES       SOCIAL HISTORY:   Social History   Tobacco Use  . Smoking status: Never Smoker  . Smokeless tobacco: Never Used  Substance Use Topics  . Alcohol use: No     FAMILY HISTORY:   Family History  Problem Relation Age of Onset  . Stroke Mother   . Stroke Father      DRUG ALLERGIES:   Allergies  Allergen Reactions  . Penicillins     Has patient had a PCN reaction causing immediate rash, facial/tongue/throat swelling, SOB or lightheadedness with hypotension: Unknown Has patient had a PCN reaction causing severe rash involving mucus membranes or skin necrosis: Unknown Has patient had a PCN reaction that required hospitalization: Unknown Has patient had a PCN reaction occurring within the last 10 years: No If all of the above answers are "NO", then may proceed with Cephalosporin use.   . Ampicillin Rash  . Hydrocodone-Homatropine     Other reaction(s): Other (See Comments) tachycardia  . Amoxicillin Rash  . Citalopram Diarrhea  . Tramadol Itching    MEDICATIONS AT HOME:   Prior to Admission medications   Medication Sig Start Date End Date Taking? Authorizing Provider  acetaminophen (TYLENOL) 325 MG tablet Take 2 tablets (650 mg total) by mouth every 6 (six) hours as needed for mild pain (or Fever >/= 101). 03/07/18   Wieting,  Richard, MD  amLODipine (NORVASC) 2.5 MG tablet Take 1 tablet (2.5 mg total) by mouth daily. Take once daily if blood pressure > 160 10/04/16 02/03/25  Merlyn Lot, MD  cholecalciferol (VITAMIN D) 1000 units tablet Take 1,000 Units by mouth daily.    [provider]  doxepin (SINEQUAN) 10 MG capsule TAKE ONE CAPSULE BY MOUTH EVERY NIGHT AT BEDTIME 07/30/16   [provider]  ferrous sulfate 325 (65 FE) MG tablet Take 1 tablet (325 mg total) by mouth 2 (two) times daily with a meal. 03/07/18   Leslye Peer, Richard, MD  metFORMIN (GLUCOPHAGE) 500 MG tablet TAKE ONE (1)  TABLET BY MOUTH EVERY DAY 07/01/16   [provider]  mirtazapine (REMERON) 7.5 MG tablet Take 15 mg by mouth at bedtime. 06/23/19   [provider]  polyethylene glycol (MIRALAX / GLYCOLAX) packet Take 17 g by mouth daily as needed for mild constipation or moderate constipation.    [provider]  telmisartan (MICARDIS) 80 MG tablet Take 1 tablet by mouth daily. 07/25/17   [provider]  vitamin B-12 (CYANOCOBALAMIN) 1000 MCG tablet Take 1,000 mcg by mouth daily.    [provider]    REVIEW OF SYSTEMS:  Review of Systems  Constitutional: Negative for chills, fever, malaise/fatigue and weight loss.  HENT: Negative for ear pain, hearing loss and tinnitus.   Eyes: Positive for blurred vision. Negative for double vision, pain and redness.  Respiratory: Negative for cough, hemoptysis and shortness of breath.   Cardiovascular: Negative for chest pain, palpitations, orthopnea and leg swelling.  Gastrointestinal: Negative for abdominal pain, constipation, diarrhea, nausea and vomiting.  Genitourinary: Negative for dysuria, frequency and hematuria.  Musculoskeletal: Negative for back pain, joint pain and neck pain.  Skin:       No acne, rash, or lesions  Neurological: Positive for headaches. Negative for dizziness, tremors, focal weakness and weakness.  Endo/Heme/Allergies: Negative for polydipsia. Does not bruise/bleed easily.  Psychiatric/Behavioral: Negative for depression. The patient is not nervous/anxious and does not have insomnia.      VITAL SIGNS:   Vitals:   07/16/19 1638 07/16/19 1652 07/16/19 2117 07/16/19 2200  BP: (!) 165/69  (!) 174/74 (!) 153/61  Pulse: 79  75 70  Resp: 18  18 18   Temp: 98.3 F (36.8 C)     TempSrc: Oral     SpO2: 98%  96% 96%  Weight:  56.7 kg    Height:  5' (1.524 m)     Wt Readings from Last 3 Encounters:  07/16/19 56.7 kg  04/27/18 56.7 kg  04/09/18 57.2 kg    PHYSICAL EXAMINATION:  Physical Exam   Vitals reviewed. Constitutional: She is oriented to person, place, and time. She appears well-developed and well-nourished. No distress.  HENT:  Head: Normocephalic and atraumatic.  Mouth/Throat: Oropharynx is clear and moist.  Eyes: Pupils are equal, round, and reactive to light. Conjunctivae and EOM are normal. No scleral icterus.  Neck: Normal range of motion. Neck supple. No JVD present. No thyromegaly present.  Cardiovascular: Normal rate, regular rhythm and intact distal pulses. Exam reveals no gallop and no friction rub.  No murmur heard. Respiratory: Effort normal and breath sounds normal. No respiratory distress. She has no wheezes. She has no rales.  GI: Soft. Bowel sounds are normal. She exhibits no distension. There is no abdominal tenderness.  Musculoskeletal: Normal range of motion.        General: No edema.     Comments: Neurologic: Cranial  nerves II-XII intact, no distinct field cut, patient endorses blurred vision in all fields of her right eye, sensation intact to light touch/pinprick, 5/5 strength in all extremities, no dysarthria, no aphasia, no dysphagia, memory intact, finger to nose testing showed no abnormality, no pronator drift  Lymphadenopathy:    She has no cervical adenopathy.  Neurological: She is alert and oriented to person, place, and time. No cranial nerve deficit.  No dysarthria, no aphasia  Skin: Skin is warm and dry. No rash noted. No erythema.  Psychiatric: She has a normal mood and affect. Her behavior is normal. Judgment and thought content normal.    LABORATORY PANEL:   CBC Recent Labs  Lab 07/16/19 1641  WBC 8.3  HGB 12.9  HCT 39.3  PLT 276   ------------------------------------------------------------------------------------------------------------------  Chemistries  Recent Labs  Lab 07/16/19 1641  NA 141  K 4.1  CL 102  CO2 29  GLUCOSE 112*  BUN 18  CREATININE 0.85  CALCIUM 9.4  AST 16  ALT 8  ALKPHOS 67  BILITOT 0.7    ------------------------------------------------------------------------------------------------------------------  Cardiac Enzymes No results for input(s): TROPONINI in the last 168 hours. ------------------------------------------------------------------------------------------------------------------  RADIOLOGY:  Ct Head Wo Contrast  Result Date: 07/16/2019 CLINICAL DATA:  Acute, severe headache, the worst of the patient's life. Vision loss in the right eye. EXAM: CT HEAD WITHOUT CONTRAST TECHNIQUE: Contiguous axial images were obtained from the base of the skull through the vertex without intravenous contrast. COMPARISON:  02/03/2018 FINDINGS: Brain: No significant change in mild-to-moderate enlargement of the ventricles and subarachnoid spaces. Stable mild patchy Mcconathy matter low density in both cerebral hemispheres. Stable old left parietal infarct. Stable small, old bilateral basal ganglia and right thalamic lacunar infarcts. No intracranial hemorrhage, mass lesion or CT evidence of acute infarction. Vascular: No hyperdense vessel or unexpected calcification. Skull: Normal. Negative for fracture or focal lesion. Sinuses/Orbits: Status post bilateral cataract extraction. Unremarkable bones and included paranasal sinuses. Other: None. IMPRESSION: 1. No acute abnormality. 2. Stable atrophy, chronic small vessel Mehl matter ischemic changes and old infarcts, as described above. Electronically Signed   By: Claudie Revering M.D.   On: 07/16/2019 17:04   Mr Brain Wo Contrast  Result Date: 07/17/2019 CLINICAL DATA:  Focal neuro deficit, greater than 6 hours, stroke suspected. Additional history provided: Patient sent to ER from eye doctor for an MRI to rule out a possible stroke. Patient woke up this morning with a severe headache and loss of vision in right eye. EXAM: MRI HEAD WITHOUT CONTRAST TECHNIQUE: Multiplanar, multiecho pulse sequences of the brain and surrounding structures were obtained  without intravenous contrast. COMPARISON:  Head CT 07/16/2019, brain MRI 07/30/2017 FINDINGS: Brain: Irregular cortical/subcortical restricted diffusion measuring 2.6 x 1.2 cm in transaxial dimensions within the left occipital lobe consistent with acute infarction. Additional subtle acute cortical infarction changes are present more medially within the left occipital lobe. Corresponding T2/FLAIR hyperintensity at this site. Additional 5 mm acute subcortical infarct within the high left frontal lobe. Redemonstrated chronic left parietal lobe infarct. Redemonstrated tiny left frontal lobe cortical infarct (series 15, image 40). Mild cerebral Haidar matter chronic small vessel ischemic disease. Multiple small T2 hyperintense foci within the bilateral basal ganglia/thalami, likely reflecting a combination of chronic lacunar infarcts and prominent perivascular spaces. Multiple small chronic lacunar infarcts within the bilateral cerebellar hemispheres which were present on prior MRI. New as compared to prior MRI there is a larger region of T2/FLAIR hyperintensity within the right inferior cerebellum without corresponding restricted diffusion,  nonspecific but likely reflecting chronic infarct. Bilateral basal ganglia mineralization. Small amount of hemosiderin deposition at site of a chronic left cerebellar lacunar infarct. Mild generalized atrophy. Vascular: Flow voids maintained within the proximal large arterial vessels. Skull and upper cervical spine: Normal marrow signal. Sinuses/Orbits: Visualized orbits demonstrate no acute abnormality. Mild maxillary sinus mucosal thickening. Small bilateral mastoid effusions (greater on the left). Findings of acute infarcts were called by telephone at the time of interpretation on 07/16/2019 at 11:59 pm to provider Dr. Owens Shark, Who verbally acknowledged these results. IMPRESSION: 1. Acute left PCA territory infarct within the left occipital lobe as described. 2. 5 mm acute infarct  within the high left frontal lobe subcortical Blasdel matter. 3. T2/FLAIR hyperintensity within the inferior right cerebellum new as compared to prior MRI 07/30/2017 without corresponding restricted diffusion, nonspecific but likely reflecting chronic infarct. Multiple additional chronic cerebellar lacunar infarcts were present on prior exam. 4. Additional chronic cortical and basal ganglia lacunar infarcts as described. 5. Small bilateral mastoid effusions. Electronically Signed   By: Kellie Simmering   On: 07/17/2019 00:02    EKG:   Orders placed or performed during the hospital encounter of 07/16/19  . EKG 12-Lead  . EKG 12-Lead    IMPRESSION AND PLAN:  Principal Problem:   CVA (cerebral vascular accident) ([place]) -seen tonight on MRI.  Admit per stroke admission order set with appropriate further imaging, labs, consults including neurology consult Active Problems:   Benign essential hypertension -massive hypertension tonight, hold antihypertensives, blood pressure goal less than 220/120   Diabetes mellitus type 2, uncomplicated (HCC) -sliding scale insulin coverage   Esophageal reflux -home dose PPI   HLD (hyperlipidemia) -home dose antilipid  Chart review performed and case discussed with ED provider. Labs, imaging and/or ECG reviewed by provider and discussed with patient/family. Management plans discussed with the patient and/or family.  COVID-19 status: Pending  DVT PROPHYLAXIS: SubQ lovenox   GI PROPHYLAXIS:  PPI  ADMISSION STATUS: Observation  CODE STATUS:Partial Code Status History    Date Active Date Inactive Code Status Order ID Comments User Context   03/02/2018 1947 03/07/2018 1900 Partial Code [id number]  Dustin Flock, MD Inpatient   07/30/2017 0233 07/30/2017 2248 Full Code [id number]  Saundra Shelling, MD Inpatient   Advance Care Planning Activity    Questions for Most Recent Historical Code Status (Order [id number])    Question Answer Comment   In the event of cardiac  or respiratory ARREST: Initiate Code Blue, Call Rapid Response Yes    In the event of cardiac or respiratory ARREST: Perform CPR Yes    In the event of cardiac or respiratory ARREST: Perform Intubation/Mechanical Ventilation No    In the event of cardiac or respiratory ARREST: Use NIPPV/BiPAp only if indicated Yes    In the event of cardiac or respiratory ARREST: Administer ACLS medications if indicated Yes    In the event of cardiac or respiratory ARREST: Perform Defibrillation or Cardioversion if indicated Yes       TOTAL TIME TAKING CARE OF THIS PATIENT: 40 minutes.   This patient was evaluated in the context of the global COVID-19 pandemic, which necessitated consideration that the patient might be at risk for infection with the SARS-CoV-2 virus that causes COVID-19. Institutional protocols and algorithms that pertain to the evaluation of patients at risk for COVID-19 are in a state of rapid change based on information released by regulatory bodies including the CDC and federal and state organizations. These policies and algorithms were followed  to the best of this provider's knowledge to date during the patient's care at this facility.  Ethlyn Daniels 07/17/2019, 12:39 AM  [facility]  Office  [phone]  CC: Primary care physician; Dion Body, MD  Note:  This document was prepared using Dragon voice recognition software and may include unintentional dictation errors.

## 2019-07-17 NOTE — ED Notes (Signed)
Ena Dawley, RN aware of bed assigned

## 2019-07-17 NOTE — Consult Note (Signed)
Reason for Consult: R vision loss  Referring Physician: Dr. Posey Pronto   CC: R vision loss   HPI: LATISIA WRZESINSKI is an 85 y.o. female with hx of HTN, GERD, HLD, prior strokes x 2 last one 2 years ago without any residual symptoms comes in with a complaint of left frontal headache and right-sided vision changes.  She states that this all started around 9:00AM yesterday. MRI with small L frontal stroke and L occipital stroke. Not on any anticoagulation/antiplatelet therapy.  Pt was on coumadin prior but it was d/c.  As per hx pt also has a PFO.   Past Medical History:  Diagnosis Date  . Allergy   . Cancer ([place]) 03/08/2018  . Diabetes mellitus without complication ([place])   . GERD (gastroesophageal reflux disease)   . HTN (hypertension)   . Hyperlipidemia   . Shingles   . Stroke [facility])     Past Surgical History:  Procedure Laterality Date  . APPENDECTOMY    . BACK SURGERY    . COLONOSCOPY WITH PROPOFOL N/A 03/06/2018   Procedure: COLONOSCOPY WITH PROPOFOL;  Surgeon: Virgel Manifold, MD;  Location: [facility] ENDOSCOPY;  Service: Endoscopy;  Laterality: N/A;  . FLEXIBLE SIGMOIDOSCOPY Left 03/04/2018   Procedure: Colonoscopy/FLEXIBLE SIGMOIDOSCOPY;  Surgeon: Virgel Manifold, MD;  Location: [facility] ENDOSCOPY;  Service: Endoscopy;  Laterality: Left;  . FOOT SURGERY Bilateral    [place]  . JOINT REPLACEMENT    . KNEE SURGERY Bilateral   . TONSILECTOMY, ADENOIDECTOMY, BILATERAL MYRINGOTOMY AND TUBES      Family History  Problem Relation Age of Onset  . Stroke Mother   . Stroke Father     Social History:  reports that she has never smoked. She has never used smokeless tobacco. She reports that she does not drink alcohol or use drugs.  Allergies  Allergen Reactions  . Penicillins     Has patient had a PCN reaction causing immediate rash, facial/tongue/throat swelling, SOB or lightheadedness with hypotension: Unknown Has patient had a PCN reaction causing severe rash involving mucus  membranes or skin necrosis: Unknown Has patient had a PCN reaction that required hospitalization: Unknown Has patient had a PCN reaction occurring within the last 10 years: No If all of the above answers are "NO", then may proceed with Cephalosporin use.   . Ampicillin Rash  . Hydrocodone-Homatropine     Reaction: tachycardia  . Amoxicillin Rash  . Citalopram Diarrhea  . Tramadol Itching    Medications: I have reviewed the patient's current medications.  ROS: History obtained from the patient  General ROS: negative for - chills, fatigue, fever, night sweats, weight gain or weight loss Psychological ROS: negative for - behavioral disorder, hallucinations, memory difficulties, mood swings or suicidal ideation Ophthalmic ROS: negative for - blurry vision, double vision, eye pain or loss of vision ENT ROS: negative for - epistaxis, nasal discharge, oral lesions, sore throat, tinnitus or vertigo Allergy and Immunology ROS: negative for - hives or itchy/watery eyes Hematological and Lymphatic ROS: negative for - bleeding problems, bruising or swollen lymph nodes Endocrine ROS: negative for - galactorrhea, hair pattern changes, polydipsia/polyuria or temperature intolerance Respiratory ROS: negative for - cough, hemoptysis, shortness of breath or wheezing Cardiovascular ROS: negative for - chest pain, dyspnea on exertion, edema or irregular heartbeat Gastrointestinal ROS: negative for - abdominal pain, diarrhea, hematemesis, nausea/vomiting or stool incontinence Genito-Urinary ROS: negative for - dysuria, hematuria, incontinence or urinary frequency/urgency Musculoskeletal ROS: negative for - joint swelling or muscular weakness Neurological ROS:  as noted in HPI Dermatological ROS: negative for rash and skin lesion changes  Physical Examination: Blood pressure (!) 142/69, pulse 81, temperature 97.7 F (36.5 C), temperature source Oral, resp. rate 20, height 5' (1.524 m), weight 55.6 kg,  SpO2 98 %.    Neurological Examination   Mental Status: Alert, oriented, thought content appropriate.  Speech fluent without evidence of aphasia.  Able to follow 3 step commands without difficulty. Cranial Nerves: II: R homonomous heminaopsia III,IV, VI: ptosis not present, extra-ocular motions intact bilaterally V,VII: smile symmetric, facial light touch sensation normal bilaterally VIII: hearing normal bilaterally IX,X: gag reflex present XI: bilateral shoulder shrug XII: midline tongue extension Motor: Right : Upper extremity   5/5    Left:     Upper extremity   5/5  Lower extremity   5/5     Lower extremity   5/5 Tone and bulk:normal tone throughout; no atrophy noted Sensory: Pinprick and light touch intact throughout, bilaterally Deep Tendon Reflexes: 2+ and symmetric throughout Plantars: Right: downgoing   Left: downgoing Cerebellar: normal finger-to-nose, normal rapid alternating movements and normal heel-to-shin test Gait: not tested      Laboratory Studies:   Basic Metabolic Panel: Recent Labs  Lab 07/16/19 1641 07/17/19 0443  NA 141  --   K 4.1  --   CL 102  --   CO2 29  --   GLUCOSE 112*  --   BUN 18  --   CREATININE 0.85 0.71  CALCIUM 9.4  --     Liver Function Tests: Recent Labs  Lab 07/16/19 1641  AST 16  ALT 8  ALKPHOS 67  BILITOT 0.7  PROT 7.0  ALBUMIN 4.1   No results for input(s): LIPASE, AMYLASE in the last 168 hours. No results for input(s): AMMONIA in the last 168 hours.  CBC: Recent Labs  Lab 07/16/19 1641 07/17/19 0443  WBC 8.3 9.3  NEUTROABS 5.8  --   HGB 12.9 12.9  HCT 39.3 38.7  MCV 102.1* 100.5*  PLT 276 270    Cardiac Enzymes: No results for input(s): CKTOTAL, CKMB, CKMBINDEX, TROPONINI in the last 168 hours.  BNP: Invalid input(s): POCBNP  CBG: No results for input(s): GLUCAP in the last 168 hours.  Microbiology: Results for orders placed or performed during the hospital encounter of 07/16/19  SARS  CORONAVIRUS 2 (TAT 6-24 HRS) Nasopharyngeal Nasopharyngeal Swab     Status: None   Collection Time: 07/17/19  1:09 AM   Specimen: Nasopharyngeal Swab  Result Value Ref Range Status   SARS Coronavirus 2 NEGATIVE NEGATIVE Final    Comment: (NOTE) SARS-CoV-2 target nucleic acids are NOT DETECTED. The SARS-CoV-2 RNA is generally detectable in upper and lower respiratory specimens during the acute phase of infection. Negative results do not preclude SARS-CoV-2 infection, do not rule out co-infections with other pathogens, and should not be used as the sole basis for treatment or other patient management decisions. Negative results must be combined with clinical observations, patient history, and epidemiological information. The expected result is Negative. Fact Sheet for Patients: SugarRoll.be Fact Sheet for Healthcare Providers: https://www.woods-mathews.com/ This test is not yet approved or cleared by the [place] FDA and  has been authorized for detection and/or diagnosis of SARS-CoV-2 by FDA under an Emergency Use Authorization (EUA). This EUA will remain  in effect (meaning this test can be used) for the duration of the COVID-19 declaration under Section 56 4(b)(1) of the Act, 21 U.S.C. section 360bbb-3(b)(1), unless the authorization is terminated  or revoked sooner. Performed at [facility], [place] [address], [place], [place] [address]     Coagulation Studies: No results for input(s): LABPROT, INR in the last 72 hours.  Urinalysis:  Recent Labs  Lab 07/17/19 1005  COLORURINE YELLOW*  LABSPEC 1.016  PHURINE 6.0  GLUCOSEU NEGATIVE  HGBUR NEGATIVE  BILIRUBINUR NEGATIVE  KETONESUR NEGATIVE  PROTEINUR NEGATIVE  NITRITE NEGATIVE  LEUKOCYTESUR MODERATE*    Lipid Panel:     Component Value Date/Time   CHOL 195 07/17/2019 0443   TRIG 64 07/17/2019 0443   HDL 76 07/17/2019 0443   CHOLHDL 2.6 07/17/2019 0443   VLDL 13  07/17/2019 0443   LDLCALC 106 (H) 07/17/2019 0443    HgbA1C:  Lab Results  Component Value Date   HGBA1C 6.5 (H) 07/17/2019    Urine Drug Screen:      Component Value Date/Time   LABOPIA NONE DETECTED 07/30/2017 0121   COCAINSCRNUR NONE DETECTED 07/30/2017 0121   LABBENZ NONE DETECTED 07/30/2017 0121   AMPHETMU NONE DETECTED 07/30/2017 0121   THCU NONE DETECTED 07/30/2017 0121   LABBARB NONE DETECTED 07/30/2017 0121    Alcohol Level: No results for input(s): ETH in the last 168 hours.  Other results: EKG: normal EKG, normal sinus rhythm, unchanged from previous tracings.  Imaging: Ct Head Wo Contrast  Result Date: 07/16/2019 CLINICAL DATA:  Acute, severe headache, the worst of the patient's life. Vision loss in the right eye. EXAM: CT HEAD WITHOUT CONTRAST TECHNIQUE: Contiguous axial images were obtained from the base of the skull through the vertex without intravenous contrast. COMPARISON:  02/03/2018 FINDINGS: Brain: No significant change in mild-to-moderate enlargement of the ventricles and subarachnoid spaces. Stable mild patchy Belitz matter low density in both cerebral hemispheres. Stable old left parietal infarct. Stable small, old bilateral basal ganglia and right thalamic lacunar infarcts. No intracranial hemorrhage, mass lesion or CT evidence of acute infarction. Vascular: No hyperdense vessel or unexpected calcification. Skull: Normal. Negative for fracture or focal lesion. Sinuses/Orbits: Status post bilateral cataract extraction. Unremarkable bones and included paranasal sinuses. Other: None. IMPRESSION: 1. No acute abnormality. 2. Stable atrophy, chronic small vessel [place]y matter ischemic changes and old infarcts, as described above. Electronically Signed   By: Claudie Revering M.D.   On: 07/16/2019 17:04   Mr Angio Head Wo Contrast  Result Date: 07/17/2019 CLINICAL DATA:  Stroke follow-up EXAM: MRA NECK WITHOUT AND WITH CONTRAST MRA HEAD WITHOUT CONTRAST TECHNIQUE:  Multiplanar and multiecho pulse sequences of the neck were obtained without and with intravenous contrast. Angiographic images of the neck were obtained using MRA technique without and with intravenous contrast.; Angiographic images of the Circle of Willis were obtained using MRA technique without intravenous contrast. CONTRAST:  72mL GADAVIST GADOBUTROL 1 MMOL/ML IV SOLN COMPARISON:  None. FINDINGS: MRA NECK FINDINGS No occlusion or stenosis of the carotid or vertebral arteries. MRA HEAD FINDINGS POSTERIOR CIRCULATION: --Vertebral arteries: Normal V4 segments. --Posterior inferior cerebellar arteries (PICA): Patent origins from the vertebral arteries. --Anterior inferior cerebellar arteries (AICA): Patent origins from the basilar artery. --Basilar artery: Normal. --Superior cerebellar arteries: Normal. --Posterior cerebral arteries: Short segment occlusion of the left posterior cerebral artery proximal P2 segment. Right PCA is patent. ANTERIOR CIRCULATION: --Intracranial internal carotid arteries: Normal. --Anterior cerebral arteries (ACA): Normal. Both A1 segments are present. Patent anterior communicating artery (a-comm). --Middle cerebral arteries (MCA): Normal. IMPRESSION: Short segment occlusion of the left posterior cerebral artery proximal P2 segment. Otherwise normal MRA of the head and neck. Electronically  Signed   By: Ulyses Jarred M.D.   On: 07/17/2019 06:44   Mr Angio Neck W Wo Contrast  Result Date: 07/17/2019 CLINICAL DATA:  Stroke follow-up EXAM: MRA NECK WITHOUT AND WITH CONTRAST MRA HEAD WITHOUT CONTRAST TECHNIQUE: Multiplanar and multiecho pulse sequences of the neck were obtained without and with intravenous contrast. Angiographic images of the neck were obtained using MRA technique without and with intravenous contrast.; Angiographic images of the Circle of Willis were obtained using MRA technique without intravenous contrast. CONTRAST:  64mL GADAVIST GADOBUTROL 1 MMOL/ML IV SOLN COMPARISON:   None. FINDINGS: MRA NECK FINDINGS No occlusion or stenosis of the carotid or vertebral arteries. MRA HEAD FINDINGS POSTERIOR CIRCULATION: --Vertebral arteries: Normal V4 segments. --Posterior inferior cerebellar arteries (PICA): Patent origins from the vertebral arteries. --Anterior inferior cerebellar arteries (AICA): Patent origins from the basilar artery. --Basilar artery: Normal. --Superior cerebellar arteries: Normal. --Posterior cerebral arteries: Short segment occlusion of the left posterior cerebral artery proximal P2 segment. Right PCA is patent. ANTERIOR CIRCULATION: --Intracranial internal carotid arteries: Normal. --Anterior cerebral arteries (ACA): Normal. Both A1 segments are present. Patent anterior communicating artery (a-comm). --Middle cerebral arteries (MCA): Normal. IMPRESSION: Short segment occlusion of the left posterior cerebral artery proximal P2 segment. Otherwise normal MRA of the head and neck. Electronically Signed   By: Ulyses Jarred M.D.   On: 07/17/2019 06:44   Mr Brain Wo Contrast  Result Date: 07/17/2019 CLINICAL DATA:  Focal neuro deficit, greater than 6 hours, stroke suspected. Additional history provided: Patient sent to ER from eye doctor for an MRI to rule out a possible stroke. Patient woke up this morning with a severe headache and loss of vision in right eye. EXAM: MRI HEAD WITHOUT CONTRAST TECHNIQUE: Multiplanar, multiecho pulse sequences of the brain and surrounding structures were obtained without intravenous contrast. COMPARISON:  Head CT 07/16/2019, brain MRI 07/30/2017 FINDINGS: Brain: Irregular cortical/subcortical restricted diffusion measuring 2.6 x 1.2 cm in transaxial dimensions within the left occipital lobe consistent with acute infarction. Additional subtle acute cortical infarction changes are present more medially within the left occipital lobe. Corresponding T2/FLAIR hyperintensity at this site. Additional 5 mm acute subcortical infarct within the high  left frontal lobe. Redemonstrated chronic left parietal lobe infarct. Redemonstrated tiny left frontal lobe cortical infarct (series 15, image 40). Mild cerebral Murri matter chronic small vessel ischemic disease. Multiple small T2 hyperintense foci within the bilateral basal ganglia/thalami, likely reflecting a combination of chronic lacunar infarcts and prominent perivascular spaces. Multiple small chronic lacunar infarcts within the bilateral cerebellar hemispheres which were present on prior MRI. New as compared to prior MRI there is a larger region of T2/FLAIR hyperintensity within the right inferior cerebellum without corresponding restricted diffusion, nonspecific but likely reflecting chronic infarct. Bilateral basal ganglia mineralization. Small amount of hemosiderin deposition at site of a chronic left cerebellar lacunar infarct. Mild generalized atrophy. Vascular: Flow voids maintained within the proximal large arterial vessels. Skull and upper cervical spine: Normal marrow signal. Sinuses/Orbits: Visualized orbits demonstrate no acute abnormality. Mild maxillary sinus mucosal thickening. Small bilateral mastoid effusions (greater on the left). Findings of acute infarcts were called by telephone at the time of interpretation on 07/16/2019 at 11:59 pm to provider Dr. Owens Shark, Who verbally acknowledged these results. IMPRESSION: 1. Acute left PCA territory infarct within the left occipital lobe as described. 2. 5 mm acute infarct within the high left frontal lobe subcortical [place] matter. 3. T2/FLAIR hyperintensity within the inferior right cerebellum new as compared to prior MRI 07/30/2017 without  corresponding restricted diffusion, nonspecific but likely reflecting chronic infarct. Multiple additional chronic cerebellar lacunar infarcts were present on prior exam. 4. Additional chronic cortical and basal ganglia lacunar infarcts as described. 5. Small bilateral mastoid effusions. Electronically Signed   By:  Kellie Simmering   On: 07/17/2019 00:02     Assessment/Plan:  85 y.o. female with hx of HTN, GERD, HLD, prior strokes x 2 last one 2 years ago without any residual symptoms comes in with a complaint of left frontal headache and right-sided vision changes.  She states that this all started around 9:00AM yesterday. MRI with small L frontal stroke and L occipital stroke. Not on any anticoagulation/antiplatelet therapy.  Pt was on coumadin prior but it was d/c.  As per hx pt also has a PFO.   - L frontal and PCA stroke that is causing R homonomous hemianopsia.  - ASA/statin - stroke likely due to PCA occlusion  - pt/ot - likely d/c planning today - could be embolic but could be do to PFO - d/w daughter and pt at bedside.  07/17/2019, 12:15 PM

## 2019-07-17 NOTE — ED Provider Notes (Signed)
I assumed care of the patient from Dr. Charna Archer at 11:00 PM.  MRI of the brain discussed with Dr. Armandina Gemma radiologist.  MRI revealed an acute left PCA territory infarct within the left occipital lobe as well as an acute infarct in the left frontal lobe.  I spoke with the patient and her daughter and informed him of this clinical finding.  Patient was given aspirin 324 mg.  Patient discussed with Dr. Jannifer Franklin hospitalist for hospital admission for further evaluation and management.   Gregor Hams, MD 07/17/19 [place]

## 2019-07-17 NOTE — Care Management Obs Status (Signed)
MEDICARE OBSERVATION STATUS NOTIFICATION   Patient Details  Name: Destiny [name] MRN: [id number] Date of Birth: 1924-03-25   Medicare Observation Status Notification Given:  Yes    Bao Bazen A Greg Eckrich, RN 07/17/2019, 9:23 AM

## 2019-07-17 NOTE — Discharge Summary (Signed)
[place] at [facility], 85 y.o., DOB [birth year], MRN [id number]. Admission date: 07/16/2019 Discharge Date 07/17/2019 Primary MD Dion Body, MD Admitting Physician Lance Coon, MD  Admission Diagnosis  Visual field constriction, unspecified laterality [H53.489]  Discharge Diagnosis   Principal Problem: Acute left PCA territory infarct Hyperlipidemia Hypertension Diabetes type 2 GERD Seasonal allergy  Hospital Course Patient is 85 year old [place] female with history of hypertension GERD, hyperlipidemia, prior strokes x2 last 1 2 years ago who presents with left frontal headache and right-sided vision changes.  Patient had a MRI which confirmed a left frontal stroke and left occipital stroke.  Per her past history patient was on Coumadin that had been discontinued.  She does have a history of possible PFO in her heart.  Patient was seen by neurology they recommended just aspirin.  They did not feel that patient at her age would be a good candidate for full dose anticoagulation or aggressive evaluation for PFO.  Patient states her vision is improved.               Consults  neurology  Significant Tests:  See full reports for all details     Ct Head Wo Contrast  Result Date: 07/16/2019 CLINICAL DATA:  Acute, severe headache, the worst of the patient's life. Vision loss in the right eye. EXAM: CT HEAD WITHOUT CONTRAST TECHNIQUE: Contiguous axial images were obtained from the base of the skull through the vertex without intravenous contrast. COMPARISON:  02/03/2018 FINDINGS: Brain: No significant change in mild-to-moderate enlargement of the ventricles and subarachnoid spaces. Stable mild patchy Murnane matter low density in both cerebral hemispheres. Stable old left parietal infarct. Stable small, old bilateral basal ganglia and right thalamic lacunar infarcts. No intracranial hemorrhage, mass lesion or CT evidence of acute infarction.  Vascular: No hyperdense vessel or unexpected calcification. Skull: Normal. Negative for fracture or focal lesion. Sinuses/Orbits: Status post bilateral cataract extraction. Unremarkable bones and included paranasal sinuses. Other: None. IMPRESSION: 1. No acute abnormality. 2. Stable atrophy, chronic small vessel Balducci matter ischemic changes and old infarcts, as described above. Electronically Signed   By: Claudie Revering M.D.   On: 07/16/2019 17:04   Mr Angio Head Wo Contrast  Result Date: 07/17/2019 CLINICAL DATA:  Stroke follow-up EXAM: MRA NECK WITHOUT AND WITH CONTRAST MRA HEAD WITHOUT CONTRAST TECHNIQUE: Multiplanar and multiecho pulse sequences of the neck were obtained without and with intravenous contrast. Angiographic images of the neck were obtained using MRA technique without and with intravenous contrast.; Angiographic images of the Circle of Willis were obtained using MRA technique without intravenous contrast. CONTRAST:  49mL GADAVIST GADOBUTROL 1 MMOL/ML IV SOLN COMPARISON:  None. FINDINGS: MRA NECK FINDINGS No occlusion or stenosis of the carotid or vertebral arteries. MRA HEAD FINDINGS POSTERIOR CIRCULATION: --Vertebral arteries: Normal V4 segments. --Posterior inferior cerebellar arteries (PICA): Patent origins from the vertebral arteries. --Anterior inferior cerebellar arteries (AICA): Patent origins from the basilar artery. --Basilar artery: Normal. --Superior cerebellar arteries: Normal. --Posterior cerebral arteries: Short segment occlusion of the left posterior cerebral artery proximal P2 segment. Right PCA is patent. ANTERIOR CIRCULATION: --Intracranial internal carotid arteries: Normal. --Anterior cerebral arteries (ACA): Normal. Both A1 segments are present. Patent anterior communicating artery (a-comm). --Middle cerebral arteries (MCA): Normal. IMPRESSION: Short segment occlusion of the left posterior cerebral artery proximal P2 segment. Otherwise normal MRA of the head and neck.  Electronically Signed   By: Ulyses Jarred M.D.   On: 07/17/2019 06:44   Mr  Angio Neck W Wo Contrast  Result Date: 07/17/2019 CLINICAL DATA:  Stroke follow-up EXAM: MRA NECK WITHOUT AND WITH CONTRAST MRA HEAD WITHOUT CONTRAST TECHNIQUE: Multiplanar and multiecho pulse sequences of the neck were obtained without and with intravenous contrast. Angiographic images of the neck were obtained using MRA technique without and with intravenous contrast.; Angiographic images of the Circle of Willis were obtained using MRA technique without intravenous contrast. CONTRAST:  64mL GADAVIST GADOBUTROL 1 MMOL/ML IV SOLN COMPARISON:  None. FINDINGS: MRA NECK FINDINGS No occlusion or stenosis of the carotid or vertebral arteries. MRA HEAD FINDINGS POSTERIOR CIRCULATION: --Vertebral arteries: Normal V4 segments. --Posterior inferior cerebellar arteries (PICA): Patent origins from the vertebral arteries. --Anterior inferior cerebellar arteries (AICA): Patent origins from the basilar artery. --Basilar artery: Normal. --Superior cerebellar arteries: Normal. --Posterior cerebral arteries: Short segment occlusion of the left posterior cerebral artery proximal P2 segment. Right PCA is patent. ANTERIOR CIRCULATION: --Intracranial internal carotid arteries: Normal. --Anterior cerebral arteries (ACA): Normal. Both A1 segments are present. Patent anterior communicating artery (a-comm). --Middle cerebral arteries (MCA): Normal. IMPRESSION: Short segment occlusion of the left posterior cerebral artery proximal P2 segment. Otherwise normal MRA of the head and neck. Electronically Signed   By: Ulyses Jarred M.D.   On: 07/17/2019 06:44   Mr Brain Wo Contrast  Result Date: 07/17/2019 CLINICAL DATA:  Focal neuro deficit, greater than 6 hours, stroke suspected. Additional history provided: Patient sent to ER from eye doctor for an MRI to rule out a possible stroke. Patient woke up this morning with a severe headache and loss of vision in right  eye. EXAM: MRI HEAD WITHOUT CONTRAST TECHNIQUE: Multiplanar, multiecho pulse sequences of the brain and surrounding structures were obtained without intravenous contrast. COMPARISON:  Head CT 07/16/2019, brain MRI 07/30/2017 FINDINGS: Brain: Irregular cortical/subcortical restricted diffusion measuring 2.6 x 1.2 cm in transaxial dimensions within the left occipital lobe consistent with acute infarction. Additional subtle acute cortical infarction changes are present more medially within the left occipital lobe. Corresponding T2/FLAIR hyperintensity at this site. Additional 5 mm acute subcortical infarct within the high left frontal lobe. Redemonstrated chronic left parietal lobe infarct. Redemonstrated tiny left frontal lobe cortical infarct (series 15, image 40). Mild cerebral Titzer matter chronic small vessel ischemic disease. Multiple small T2 hyperintense foci within the bilateral basal ganglia/thalami, likely reflecting a combination of chronic lacunar infarcts and prominent perivascular spaces. Multiple small chronic lacunar infarcts within the bilateral cerebellar hemispheres which were present on prior MRI. New as compared to prior MRI there is a larger region of T2/FLAIR hyperintensity within the right inferior cerebellum without corresponding restricted diffusion, nonspecific but likely reflecting chronic infarct. Bilateral basal ganglia mineralization. Small amount of hemosiderin deposition at site of a chronic left cerebellar lacunar infarct. Mild generalized atrophy. Vascular: Flow voids maintained within the proximal large arterial vessels. Skull and upper cervical spine: Normal marrow signal. Sinuses/Orbits: Visualized orbits demonstrate no acute abnormality. Mild maxillary sinus mucosal thickening. Small bilateral mastoid effusions (greater on the left). Findings of acute infarcts were called by telephone at the time of interpretation on 07/16/2019 at 11:59 pm to provider Dr. Owens Shark, Who verbally  acknowledged these results. IMPRESSION: 1. Acute left PCA territory infarct within the left occipital lobe as described. 2. 5 mm acute infarct within the high left frontal lobe subcortical Pedro matter. 3. T2/FLAIR hyperintensity within the inferior right cerebellum new as compared to prior MRI 07/30/2017 without corresponding restricted diffusion, nonspecific but likely reflecting chronic infarct. Multiple additional chronic cerebellar lacunar infarcts were  present on prior exam. 4. Additional chronic cortical and basal ganglia lacunar infarcts as described. 5. Small bilateral mastoid effusions. Electronically Signed   By: Kellie Simmering   On: 07/17/2019 00:02       Today   Subjective:   Destiny [name] patient states vision improved Objective:   Blood pressure 139/65, pulse 72, temperature 98.3 F (36.8 C), temperature source Oral, resp. rate 20, height 5' (1.524 m), weight 55.6 kg, SpO2 98 %.  . No intake or output data in the 24 hours ending 07/17/19 1331  Exam VITAL SIGNS: Blood pressure 139/65, pulse 72, temperature 98.3 F (36.8 C), temperature source Oral, resp. rate 20, height 5' (1.524 m), weight 55.6 kg, SpO2 98 %.  GENERAL:  85 y.o.-year-old patient lying in the bed with no acute distress.  EYES: Pupils equal, round, reactive to light and accommodation. No scleral icterus. Extraocular muscles intact.  HEENT: Head atraumatic, normocephalic. Oropharynx and nasopharynx clear.  NECK:  Supple, no jugular venous distention. No thyroid enlargement, no tenderness.  LUNGS: Normal breath sounds bilaterally, no wheezing, rales,rhonchi or crepitation. No use of accessory muscles of respiration.  CARDIOVASCULAR: S1, S2 normal. No murmurs, rubs, or gallops.  ABDOMEN: Soft, nontender, nondistended. Bowel sounds present. No organomegaly or mass.  EXTREMITIES: No pedal edema, cyanosis, or clubbing.  NEUROLOGIC: Cranial nerves II through XII are intact. Muscle strength 5/5 in all extremities.  Sensation intact. Gait not checked.  PSYCHIATRIC: The patient is alert and oriented x 3.  SKIN: No obvious rash, lesion, or ulcer.   Data Review     CBC w Diff:  Lab Results  Component Value Date   WBC 9.3 07/17/2019   HGB 12.9 07/17/2019   HGB 13.5 02/09/2013   HCT 38.7 07/17/2019   HCT 40.4 02/09/2013   PLT 270 07/17/2019   PLT 207 02/09/2013   LYMPHOPCT 20 07/16/2019   MONOPCT 8 07/16/2019   EOSPCT 1 07/16/2019   BASOPCT 1 07/16/2019   CMP:  Lab Results  Component Value Date   NA 141 07/16/2019   NA 140 02/09/2013   K 4.1 07/16/2019   K 3.8 02/09/2013   CL 102 07/16/2019   CL 103 02/09/2013   CO2 29 07/16/2019   CO2 32 02/09/2013   BUN 18 07/16/2019   BUN 20 (H) 02/09/2013   CREATININE 0.71 07/17/2019   CREATININE 0.77 02/09/2013   PROT 7.0 07/16/2019   PROT 7.5 02/09/2013   ALBUMIN 4.1 07/16/2019   ALBUMIN 3.9 02/09/2013   BILITOT 0.7 07/16/2019   BILITOT 0.5 02/09/2013   ALKPHOS 67 07/16/2019   ALKPHOS 87 02/09/2013   AST 16 07/16/2019   AST 23 02/09/2013   ALT 8 07/16/2019   ALT 13 02/09/2013  .  Micro Results Recent Results (from the past 240 hour(s))  SARS CORONAVIRUS 2 (TAT 6-24 HRS) Nasopharyngeal Nasopharyngeal Swab     Status: None   Collection Time: 07/17/19  1:09 AM   Specimen: Nasopharyngeal Swab  Result Value Ref Range Status   SARS Coronavirus 2 NEGATIVE NEGATIVE Final    Comment: (NOTE) SARS-CoV-2 target nucleic acids are NOT DETECTED. The SARS-CoV-2 RNA is generally detectable in upper and lower respiratory specimens during the acute phase of infection. Negative results do not preclude SARS-CoV-2 infection, do not rule out co-infections with other pathogens, and should not be used as the sole basis for treatment or other patient management decisions. Negative results must be combined with clinical observations, patient history, and epidemiological information. The expected result is  Negative. Fact Sheet for  Patients: SugarRoll.be Fact Sheet for Healthcare Providers: https://www.woods-mathews.com/ This test is not yet approved or cleared by the [place] FDA and  has been authorized for detection and/or diagnosis of SARS-CoV-2 by FDA under an Emergency Use Authorization (EUA). This EUA will remain  in effect (meaning this test can be used) for the duration of the COVID-19 declaration under Section 56 4(b)(1) of the Act, 21 U.S.C. section 360bbb-3(b)(1), unless the authorization is terminated or revoked sooner. Performed at [facility], [place] [address] [place], [place] [address]         Code Status Orders  (From admission, onward)         Start     Ordered   07/17/19 0234  Limited resuscitation (code)  Continuous    Question Answer Comment  In the event of cardiac or respiratory ARREST: Initiate Code Blue, Call Rapid Response Yes   In the event of cardiac or respiratory ARREST: Perform CPR Yes   In the event of cardiac or respiratory ARREST: Perform Intubation/Mechanical Ventilation No   In the event of cardiac or respiratory ARREST: Use NIPPV/BiPAp only if indicated Yes   In the event of cardiac or respiratory ARREST: Administer ACLS medications if indicated Yes   In the event of cardiac or respiratory ARREST: Perform Defibrillation or Cardioversion if indicated Yes      07/17/19 0233        Code Status History    Date Active Date Inactive Code Status Order ID Comments User Context   03/02/2018 1947 03/07/2018 1900 Partial Code [id number]  Dustin Flock, MD Inpatient   07/30/2017 0233 07/30/2017 2248 Full Code [id number]  Saundra Shelling, MD Inpatient   Advance Care Planning Activity    Advance Directive Documentation     Most Recent Value  Type of Advance Directive  Healthcare Power of Attorney, Living will  Pre-existing out of facility DNR order (yellow form or pink MOST form)  -  "MOST" Form in Place?  -           Follow-up Information    Dion Body, MD Follow up in 6 day(s).   Specialty: Family Medicine Contact information: [place] [address] [phone]           Discharge Medications   Allergies as of 07/17/2019      Reactions   Penicillins    Has patient had a PCN reaction causing immediate rash, facial/tongue/throat swelling, SOB or lightheadedness with hypotension: Unknown Has patient had a PCN reaction causing severe rash involving mucus membranes or skin necrosis: Unknown Has patient had a PCN reaction that required hospitalization: Unknown Has patient had a PCN reaction occurring within the last 10 years: No If all of the above answers are "NO", then may proceed with Cephalosporin use.   Ampicillin Rash   Hydrocodone-homatropine    Reaction: tachycardia   Amoxicillin Rash   Citalopram Diarrhea   Tramadol Itching      Medication List    STOP taking these medications   ferrous sulfate 325 (65 FE) MG tablet     TAKE these medications   amLODipine 2.5 MG tablet Commonly known as: NORVASC Take 1 tablet (2.5 mg total) by mouth daily. Take once daily if blood pressure > 160 What changed: additional instructions Notes to patient: Next dose due Sunday morning.   aspirin 81 MG chewable tablet Chew 1 tablet (81 mg total) by mouth daily. Start taking on: July 18, 2019 Notes to patient: Next dose due Sunday morning   atorvastatin 40 MG tablet Commonly known as: LIPITOR Take 1 tablet (40 mg total) by mouth daily at 6 PM. Notes to patient: Start this evening   Devrom 200 MG Chew Generic drug: Bismuth Subgallate Chew 200 mg by mouth 4 (four) times daily as needed (gas).   metFORMIN 500 MG tablet Commonly known as: GLUCOPHAGE Take 500 mg by mouth daily. Notes to patient: Next dose due Sunday morning   mirtazapine 7.5 MG tablet Commonly known as: REMERON Take 15 mg by mouth at bedtime. Notes to  patient: Next dose due tonight   telmisartan 80 MG tablet Commonly known as: MICARDIS Take 1 tablet by mouth at bedtime. Notes to patient: Next dose due Sunday morning.           Total Time in preparing paper work, data evaluation and todays exam - 34 minutes  Dustin Flock M.D on 07/17/2019 at [place]  [phone]

## 2019-07-17 NOTE — Progress Notes (Addendum)
SLP Cancellation Note  Patient Details Name: OSSIE YEBRA MRN: [id number] DOB: [birth year]   Cancelled treatment:       Reason Eval/Treat Not Completed: SLP screened, no needs identified, will sign off(chart reviewed; consulted NSG then met w/ pt in room). Pt denied any difficulty swallowing and is now on a regular diet; tolerated swallowing pills w/ water per NSG last night, and she took sips of water via straw while SLP was in room. Pt conversed at conversational level w/out deficits noted; pt denied any speech-language deficits just min vision deficits. Placed pt's diet order once she gave her preferences verbally given choices from menu. No further skilled ST services indicated as pt appears at her baseline. Pt agreed. NSG to reconsult if any change in status while admitted.      Orinda Kenner, MS, CCC-SLP Watson,Katherine 07/17/2019, 9:09 AM

## 2019-07-18 LAB — ECHOCARDIOGRAM COMPLETE
Height: 60 in
Weight: 1961.21 oz

## 2019-07-20 DIAGNOSIS — Z8673 Personal history of transient ischemic attack (TIA), and cerebral infarction without residual deficits: Secondary | ICD-10-CM | POA: Insufficient documentation

## 2019-08-03 ENCOUNTER — Other Ambulatory Visit: Payer: Self-pay | Admitting: Neurology

## 2019-08-03 ENCOUNTER — Other Ambulatory Visit: Payer: Self-pay | Admitting: Family Medicine

## 2019-08-03 ENCOUNTER — Other Ambulatory Visit: Payer: Self-pay

## 2019-08-03 ENCOUNTER — Ambulatory Visit
Admission: RE | Admit: 2019-08-03 | Discharge: 2019-08-03 | Disposition: A | Discharge status: Home / Self Care | Payer: Medicare Other | Source: Ambulatory Visit | Attending: Neurology | Admitting: Neurology

## 2019-08-03 DIAGNOSIS — I639 Cerebral infarction, unspecified: Secondary | ICD-10-CM

## 2019-08-03 DIAGNOSIS — Z8673 Personal history of transient ischemic attack (TIA), and cerebral infarction without residual deficits: Secondary | ICD-10-CM

## 2019-08-06 ENCOUNTER — Telehealth: Admit: 2019-08-06 | Discharge: 2019-08-07 | Payer: MEDICARE

## 2019-08-06 DIAGNOSIS — C20 Malignant neoplasm of rectum: Principal | ICD-10-CM

## 2019-08-06 DIAGNOSIS — C7800 Secondary malignant neoplasm of unspecified lung: Principal | ICD-10-CM

## 2019-08-06 DIAGNOSIS — I639 Cerebral infarction, unspecified: Principal | ICD-10-CM

## 2019-08-10 DIAGNOSIS — I63412 Cerebral infarction due to embolism of left middle cerebral artery: Secondary | ICD-10-CM | POA: Insufficient documentation

## 2019-08-13 IMAGING — CT CT HEAD W/O CM
3 series · 15 of 44 positions shown, 18 images · non-contrast
Comparison: 10/04/2016

CLINICAL DATA: Patient woke up this morning with left arm heaviness
and numbness. Symptoms resolved.

EXAM:
CT HEAD WITHOUT CONTRAST
TECHNIQUE: Contiguous axial images were obtained from the base of the skull
through the vertex without intravenous contrast.

[Series 3: head wo · axial · 0.39mm/px · z∈[+550,+660]mm · 9 of 27 slices shown, 12 images]
[im 3/27  brain]
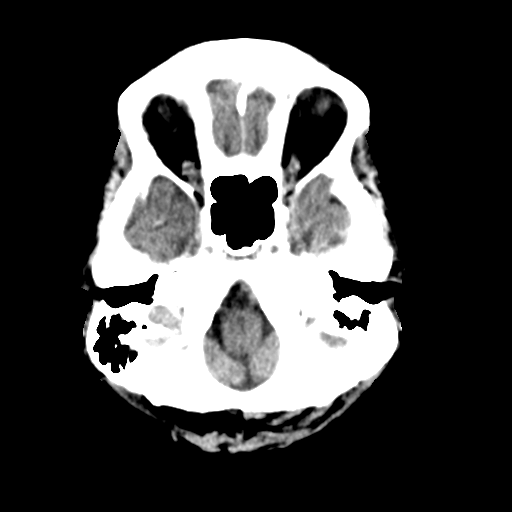
[im 3/27  bone]
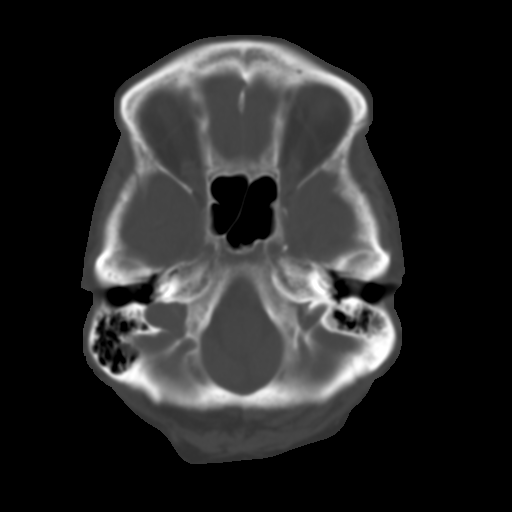
[im 6/27  brain]
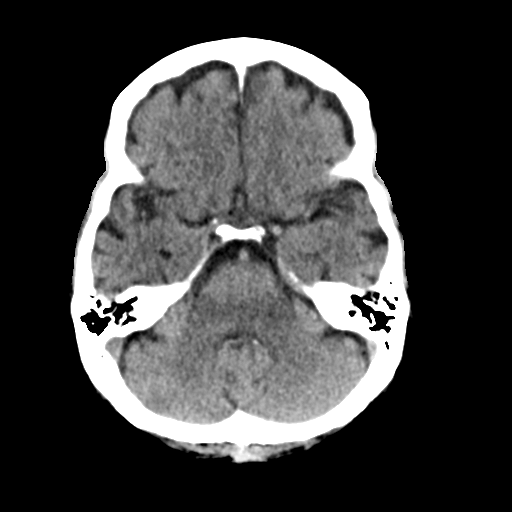
[im 8/27  brain]
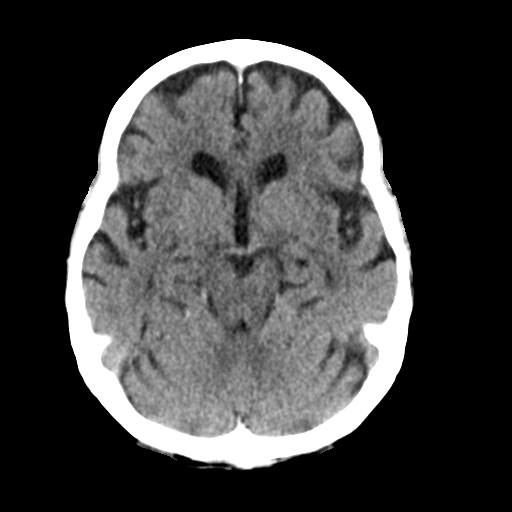
[im 11/27  brain]
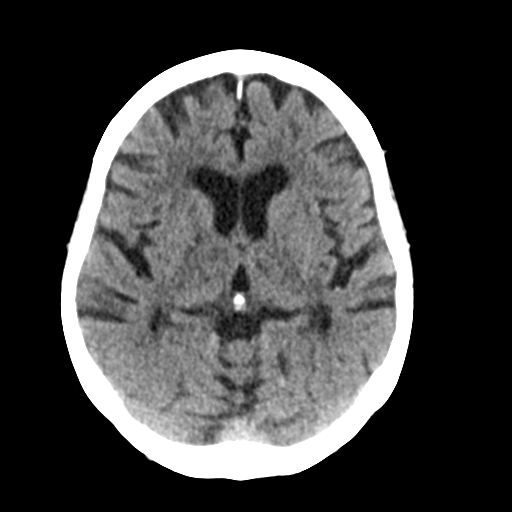
[im 14/27  brain]
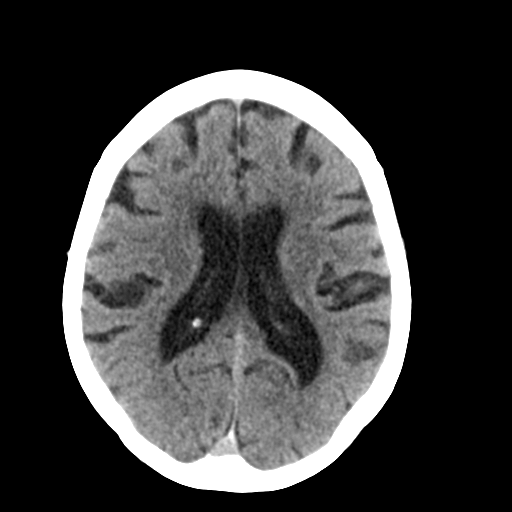
[im 14/27  bone]
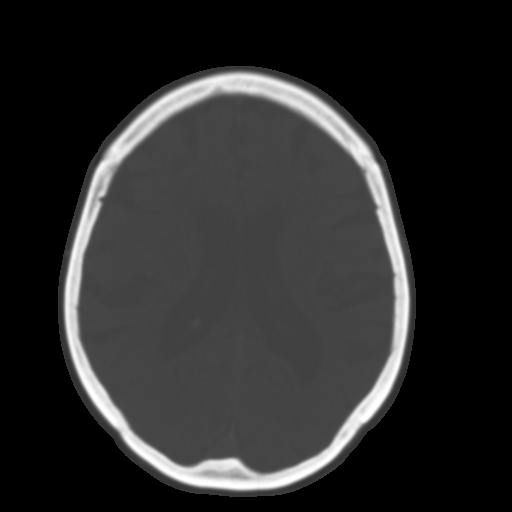
[im 17/27  brain]
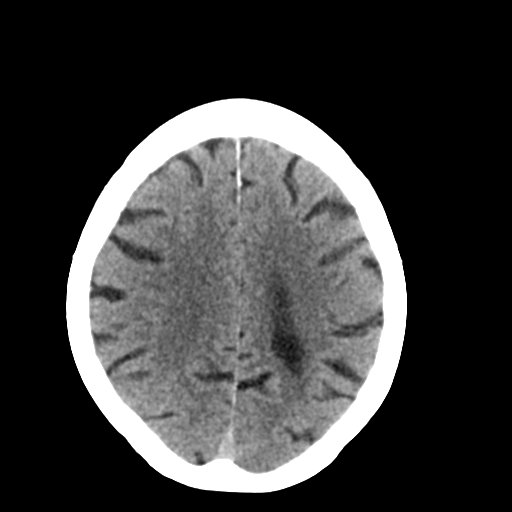
[im 20/27  brain]
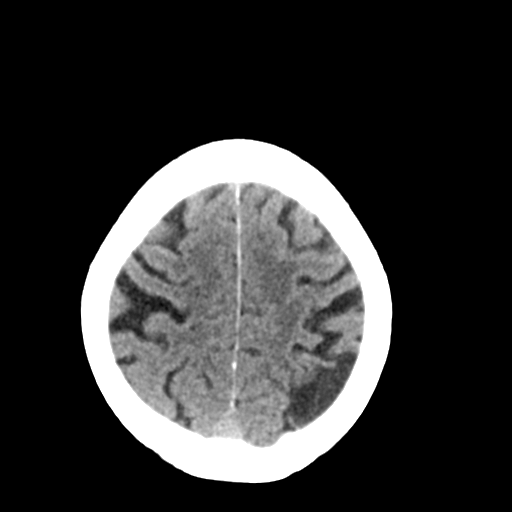
[im 22/27  brain]
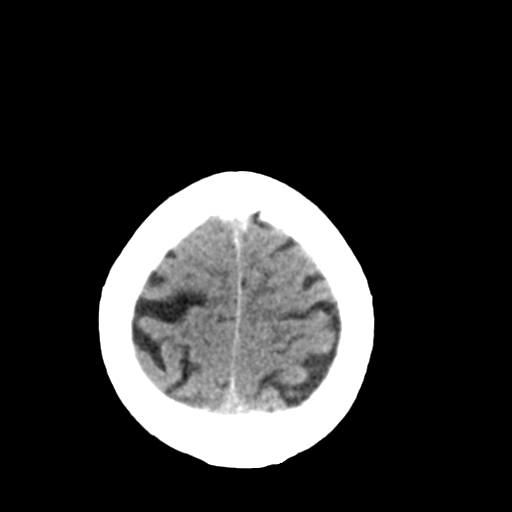
[im 25/27  brain]
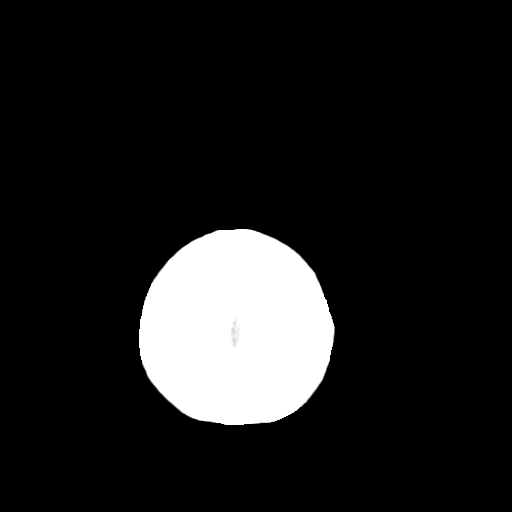
[im 25/27  bone]
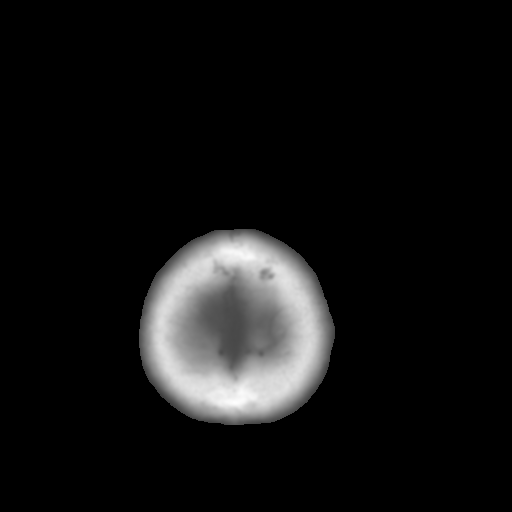

[Series 4: coronal soft tissue · coronal · 0.28mm/px · 3 of 59 slices shown]
[im 20/59  brain]
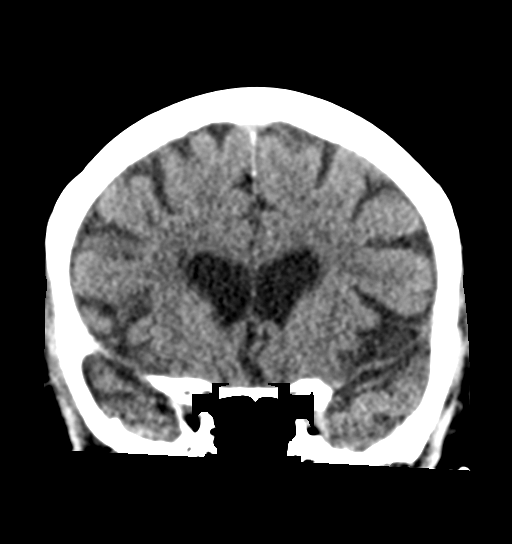
[im 26/59  brain]
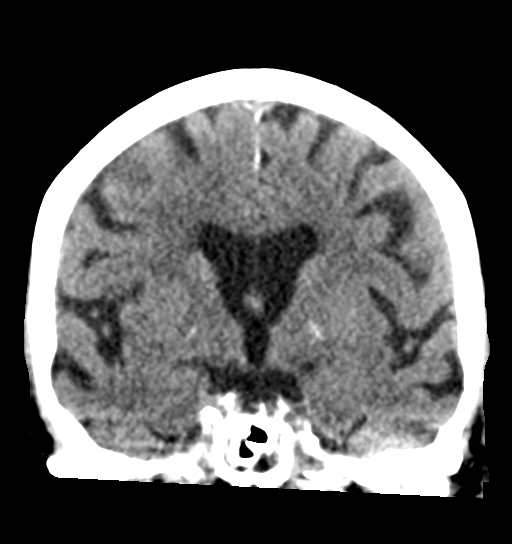
[im 33/59  brain]
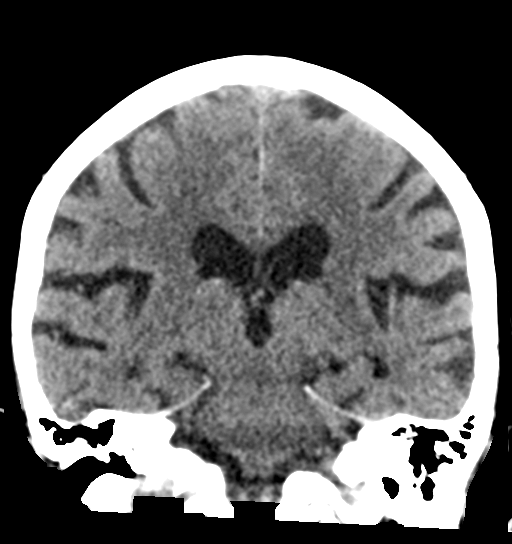

[Series 5: sagittal soft tissue · sagittal · 0.31mm/px · 3 of 47 slices shown]
[im 16/47  brain]
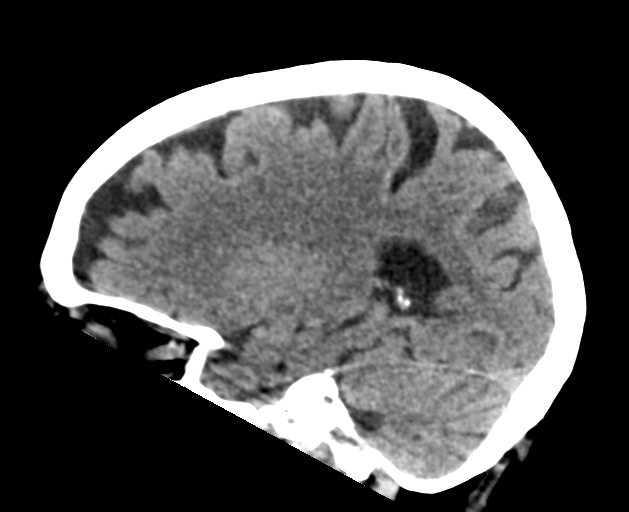
[im 24/47  brain]
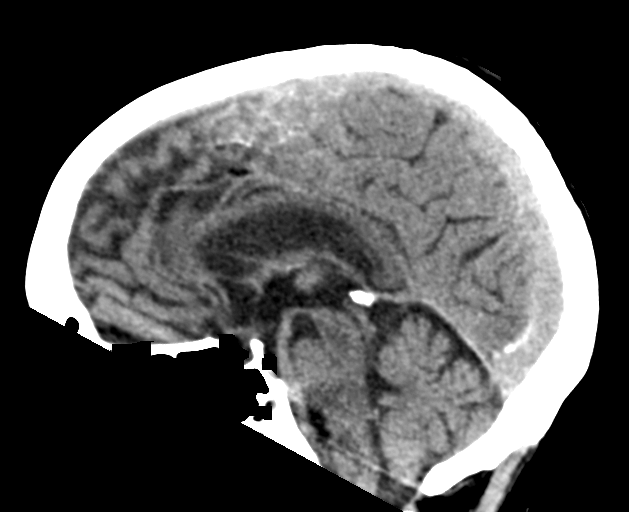
[im 31/47  brain]
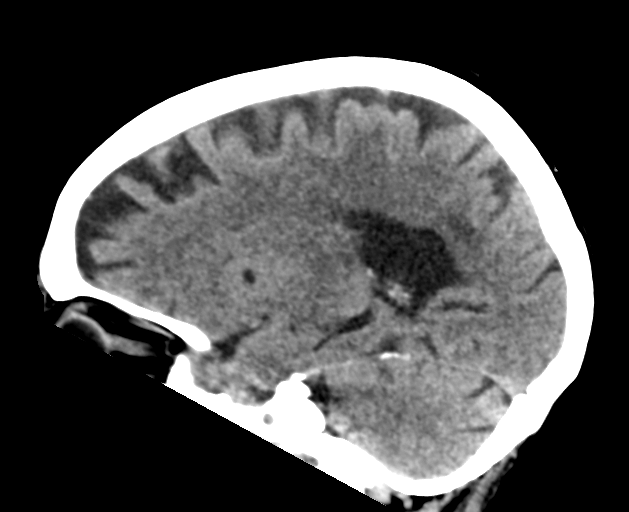

[15 of 44 positions shown; findings below may reference images not displayed]

FINDINGS: Brain: Diffuse cerebral atrophy. Ventricular dilatation consistent
with central atrophy. Patchy low-attenuation changes in the deep
white matter consistent small vessel ischemia. Encephalomalacia in
the left parietal region extends to the cortex consistent with old
infarct. No change since prior study. No mass effect or midline
shift. No abnormal extra-axial fluid collections. Gray-white matter
junctions are distinct. Basal cisterns are not effaced. No evidence
of acute intracranial hemorrhage.

Vascular: Internal carotid artery and vertebrobasilar artery
vascular calcifications.

Skull: The calvarium appears intact.

Sinuses/Orbits: Paranasal sinuses and mastoid air cells are clear

Other: None.
IMPRESSION: No acute intracranial abnormalities. Chronic atrophy and small
vessel ischemic changes. Old left parietal infarct. No change since
prior study.

## 2019-08-16 MED ORDER — MIRTAZAPINE 7.5 MG TABLET
ORAL_TABLET | Freq: Every evening | ORAL | 3 refills | 90.00000 days | Status: CP
Start: 2019-08-16 — End: 2019-08-16

## 2019-08-16 MED ORDER — MIRTAZAPINE 7.5 MG TABLET: 8 mg | tablet | Freq: Every evening | 3 refills | 90 days | Status: AC

## 2019-08-16 MED ORDER — MIRTAZAPINE 7.5 MG TABLET: 15 mg | tablet | Freq: Every evening | 3 refills | 45 days | Status: AC

## 2019-08-19 DIAGNOSIS — Q2112 Patent foramen ovale: Secondary | ICD-10-CM | POA: Insufficient documentation

## 2019-08-19 DIAGNOSIS — D123 Benign neoplasm of transverse colon: Secondary | ICD-10-CM

## 2019-08-19 DIAGNOSIS — I351 Nonrheumatic aortic (valve) insufficiency: Secondary | ICD-10-CM | POA: Insufficient documentation

## 2019-10-01 ENCOUNTER — Other Ambulatory Visit: Payer: Self-pay

## 2019-10-01 ENCOUNTER — Ambulatory Visit: Payer: Medicare Other | Attending: Internal Medicine

## 2019-10-01 DIAGNOSIS — Z20822 Contact with and (suspected) exposure to covid-19: Secondary | ICD-10-CM

## 2019-10-02 LAB — NOVEL CORONAVIRUS, NAA: SARS-CoV-2, NAA: NOT DETECTED

## 2019-10-13 DIAGNOSIS — Z96652 Presence of left artificial knee joint: Secondary | ICD-10-CM | POA: Insufficient documentation

## 2019-11-03 ENCOUNTER — Other Ambulatory Visit: Payer: Self-pay | Admitting: Orthopedic Surgery

## 2019-11-03 DIAGNOSIS — G8929 Other chronic pain: Secondary | ICD-10-CM

## 2019-11-03 DIAGNOSIS — M545 Low back pain, unspecified: Secondary | ICD-10-CM

## 2019-11-04 ENCOUNTER — Other Ambulatory Visit: Payer: Self-pay

## 2019-11-04 ENCOUNTER — Ambulatory Visit
Admission: RE | Admit: 2019-11-04 | Discharge: 2019-11-04 | Disposition: A | Discharge status: Home / Self Care | Source: Ambulatory Visit | Attending: Orthopedic Surgery | Admitting: Orthopedic Surgery

## 2019-11-04 DIAGNOSIS — G8929 Other chronic pain: Secondary | ICD-10-CM | POA: Insufficient documentation

## 2019-11-04 DIAGNOSIS — M545 Low back pain, unspecified: Secondary | ICD-10-CM

## 2019-11-04 DIAGNOSIS — M5442 Lumbago with sciatica, left side: Secondary | ICD-10-CM | POA: Insufficient documentation

## 2019-11-07 ENCOUNTER — Ambulatory Visit: Payer: Medicare Other

## 2019-12-02 ENCOUNTER — Telehealth: Admit: 2019-12-02 | Discharge: 2019-12-03 | Payer: MEDICARE

## 2019-12-22 ENCOUNTER — Encounter: Payer: Self-pay | Admitting: Pain Medicine

## 2019-12-22 NOTE — Progress Notes (Signed)
Patient: Destiny [name]  Service Category: E/M  Provider: Gaspar Cola, MD  DOB: [birth year]  DOS: 12/23/2019  Location: Office  MRN: [id number]  Setting: Ambulatory outpatient  Referring Provider: Dion Body, MD  Type: New Patient  Specialty: Interventional Pain Management  PCP: Dion Body, MD  Location: Remote location  Delivery: TeleHealth     Virtual Encounter - Pain Management PROVIDER NOTE: Information contained herein reflects review and annotations entered in association with encounter. Interpretation of such information and data should be left to medically-trained personnel. Information provided to patient can be located elsewhere in the medical record under "Patient Instructions". Document created using STT-dictation technology, any transcriptional errors that may result from process are unintentional.    Contact & Pharmacy Preferred: [phone] Home: [phone] (home) Mobile: [phone] (mobile) E-mail: [email or web address]  [place], [place] [address] [address] Phone: [phone] Fax: [place], [place] [place] [address] Phone: [phone] Fax: [phone]  [place], [place] [address] Phone: [phone] Fax: [phone]   Pre-screening note:  Our staff contacted Ms. Leggitt and offered her an "in person", "face-to-face" appointment versus a telephone encounter. She indicated preferring the telephone encounter, at this time.  Primary Reason(s) for Visit: Tele-Encounter for initial evaluation of one or more chronic problems (new to examiner) potentially causing chronic pain, and posing a threat to normal musculoskeletal function. (Level of risk: High) CC: Leg Pain (left)  I contacted Samantha Crimes on 12/23/2019 via telephone.      I  clearly identified myself as Gaspar Cola, MD. I verified that I was speaking with the correct person using two identifiers (Name: Destiny [name], and date of birth: 1924-08-19).  Advanced Informed Consent I sought verbal advanced consent from Samantha Crimes for virtual visit interactions. I informed Ms. All of possible security and privacy concerns, risks, and limitations associated with providing "not-in-person" medical evaluation and management services. I also informed Ms. Childrey of the availability of "in-person" appointments. Finally, I informed her that there would be a charge for the virtual visit and that she could be  personally, fully or partially, financially responsible for it. Ms. Herne expressed understanding and agreed to proceed.   HPI  [name] is a 86 y.o. year old, female patient, contacted today for an initial evaluation of her chronic pain. She has Chronic myofascial pain; Esophageal reflux; Benign essential hypertension; Migraine; HLD (hyperlipidemia); Pseudophakia, both eyes; Salzmann's nodular dystrophy; Type II or unspecified type diabetes mellitus without mention of complication, not stated as uncontrolled; CVA (cerebral vascular accident) ([place]); Diabetes mellitus type 2, uncomplicated ([place]); Chronic low back pain (Primary Area of Pain) (Bilateral) (L>R); Chronic sacroiliac joint pain (Bilateral) (L>R); Chronic hip pain (Bilateral) (L>R); Chronic pain syndrome; Osteoarthritis of sacroiliac joints (Bilateral); Osteoarthritis of hip (Bilateral); Osteoarthritis of lumbar spine; Lumbar spondylosis; Long term current use of anticoagulant (Plavix); DDD (degenerative disc disease), lumbar; Lumbar facet hypertrophy; Lumbar facet syndrome (Bilateral); Trochanteric bursitis of hip (Right); Trigger point with back pain (buttocks area) (Right); Groin pain (Right); Spondylosis without myelopathy or radiculopathy, lumbosacral region; Lower GI bleed; GI bleed; Internal hemorrhoids; Nodule of  rectum; Intestinal neoplasm; Hematochezia; Benign neoplasm of transverse colon; Polyp of sigmoid colon; Intestinal lump; Malignant neoplasm of rectum ([place]); Diverticulosis of large intestine without diverticulitis; Goals of care, counseling/discussion; Other  specified dorsopathies, sacral and sacrococcygeal region; Pharmacologic therapy; Disorder of skeletal system; Problems influencing health status; Thoracic radiculitis (Right); Acute low back pain without sciatica; Acute upper back pain (Right); DDD (degenerative disc disease), thoracic; Stroke ([place]); Stroke (cerebrum) ([place]); Colon cancer metastasized to lung [facility]); GAD (generalized anxiety disorder); History of CVA (cerebrovascular accident); History of knee replacement, total, left; Metastasis to lung ([place]); Nonrheumatic aortic valve insufficiency; PFO (patent foramen ovale); Cerebrovascular accident (CVA) due to embolism of left middle cerebral artery ([place]); Type 2 diabetes mellitus with hyperlipidemia ([place]); Chronic knee pain after total replacement (Left); Chronic knee pain (Left); Lumbar foraminal stenosis (L4-5) (Left); Lumbar radiculopathy (L4) (Left); and Chronic anticoagulation (Plavix) on their problem list.   Onset and Duration: Gradual and Present less than 3 months Cause of pain: Unknown Severity: No change since onset, NAS-11 at its worse: 10/10, NAS-11 at its best: 0/10, NAS-11 now: 0/10 and NAS-11 on the average: 8/10 Timing: Morning, During activity or exercise and After activity or exercise Aggravating Factors: Walking Alleviating Factors: elevation of legs Quality of Pain: Aching Previous Examinations or Tests: MRI scan  This is a case of an 86 year old [place] female patient known to our service due to prior successful interventional therapies.  The patient indicates that she began to experience pain in the area of the left knee where she had a total knee replacement approximately 25 years ago.  She describes the pain as starting  above the knee and going down past the knee to the area of the ankle.  The patient's daughter was present during the encounter and she helped clarify certain things.  The patient indicates that she is not really having much low back pain and that the pain is primarily in the area of the leg.  She recently had an MRI done in January and subsequently she was sent to Dr. Sharlet Salina home on 11/14/2020 did a left-sided L5 and left-sided S1 transforaminal epidural steroid injection with no benefit.  The patient then returned on 12/14/2019 at which time Dr. Sharlet Salina did a left L3 and a left S1 transforaminal epidural steroid injection with no benefit.  Although the patient could be experiencing pain coming from the knee, it is not less likely as it coming from the lumbar spine, especially with the findings seen on her MRI.  However, the patient symptoms appear to follow a left L4 dermatomal distribution and the MRI describes the patient having a left L4-5 foraminal stenosis and a left L4-5 subarticular stenosis that would suggest that the patient would benefit more from a left L4 transforaminal epidural steroid injection, perhaps combined with a left-sided L4-5 interlaminar LESI to address those 2 areas.  Historic Controlled Substance Pharmacotherapy Review  Current opioid analgesics: Hydrocodone/APAP 5/325, 1 tab PO q 4 hrs (30 mg/day of hydrocodone) (30 MME/day) (last prescribed on 12/19/2019 by Dr. Bess Harvest, [facility]) Highest recorded MME/day: 30 mg/day MME/day: 30 mg/day   Historical Monitoring: The patient  reports no history of drug use. List of all UDS Test(s): Lab Results  Component Value Date   MDMA NONE DETECTED 07/30/2017   COCAINSCRNUR NONE DETECTED 07/30/2017   PCPSCRNUR NONE DETECTED 07/30/2017   THCU NONE DETECTED 07/30/2017   ETH <10 07/30/2017   List of other Serum/Urine Drug Screening Test(s):  Lab Results  Component Value Date   COCAINSCRNUR NONE DETECTED 07/30/2017   THCU  NONE DETECTED 07/30/2017   ETH <10 07/30/2017   Historical Background Evaluation: [place] PMP: PDMP not reviewed this encounter. Two (2)  year initial data search conducted.             PMP NARX Score Report:  Narcotic: 401 Sedative: 411 Stimulant: 000 Risk Assessment Profile: PMP NARX Overdose Risk Score: 500  Pharmacologic Plan: As per protocol, I have not taken over any controlled substance management, pending the results of ordered tests and/or consults.            Initial impression: Pending review of available data and ordered tests.  Meds   Current Outpatient Medications:  .  amLODipine (NORVASC) 2.5 MG tablet, Take 1 tablet (2.5 mg total) by mouth daily. Take once daily if blood pressure > 160 (Patient taking differently: Take 2.5 mg by mouth daily. ), Disp: 14 tablet, Rfl: 0 .  aspirin 81 MG chewable tablet, Chew 1 tablet (81 mg total) by mouth daily., Disp:  , Rfl:  .  clopidogrel (PLAVIX) 75 MG tablet, , Disp: , Rfl:  .  gabapentin (NEURONTIN) 100 MG capsule, Take by mouth 3 (three) times daily. , Disp: , Rfl:  .  hydrochlorothiazide (HYDRODIURIL) 12.5 MG tablet, Take 12.5 mg by mouth daily as needed., Disp: , Rfl:  .  LORazepam (ATIVAN) 0.5 MG tablet, Take by mouth every 4 (four) hours as needed. , Disp: , Rfl:  .  metFORMIN (GLUCOPHAGE) 500 MG tablet, Take 500 mg by mouth daily. , Disp: , Rfl:  .  metoprolol succinate (TOPROL-XL) 25 MG 24 hr tablet, Take by mouth., Disp: , Rfl:  .  mirtazapine (REMERON) 7.5 MG tablet, Take 15 mg by mouth at bedtime., Disp: , Rfl:  .  prochlorperazine (COMPAZINE) 10 MG tablet, Take 10 mg by mouth every 6 (six) hours as needed for nausea or vomiting., Disp: , Rfl:  .  telmisartan (MICARDIS) 80 MG tablet, Take 1 tablet by mouth at bedtime. , Disp: , Rfl:   ROS  Cardiovascular: High blood pressure Pulmonary or Respiratory: No reported pulmonary signs or symptoms such as wheezing and difficulty taking a deep full breath (Asthma), difficulty blowing  air out (Emphysema), coughing up mucus (Bronchitis), persistent dry cough, or temporary stoppage of breathing during sleep Neurological: Stroke (Residual deficits or weakness: none) Psychological-Psychiatric: No reported psychological or psychiatric signs or symptoms such as difficulty sleeping, anxiety, depression, delusions or hallucinations (schizophrenial), mood swings (bipolar disorders) or suicidal ideations or attempts Gastrointestinal: No reported gastrointestinal signs or symptoms such as vomiting or evacuating blood, reflux, heartburn, alternating episodes of diarrhea and constipation, inflamed or scarred liver, or pancreas or irrregular and/or infrequent bowel movements Genitourinary: No reported renal or genitourinary signs or symptoms such as difficulty voiding or producing urine, peeing blood, non-functioning kidney, kidney stones, difficulty emptying the bladder, difficulty controlling the flow of urine, or chronic kidney disease Hematological: No reported hematological signs or symptoms such as prolonged bleeding, low or poor functioning platelets, bruising or bleeding easily, hereditary bleeding problems, low energy levels due to low hemoglobin or being anemic Endocrine: High blood sugar controlled without the use of insulin (NIDDM) Rheumatologic: No reported rheumatological signs and symptoms such as fatigue, joint pain, tenderness, swelling, redness, heat, stiffness, decreased range of motion, with or without associated rash Musculoskeletal: Negative for myasthenia gravis, muscular dystrophy, multiple sclerosis or malignant hyperthermia Work History: Homemaker  Allergies  Ms. Harbison is allergic to penicillins; ampicillin; hydrocodone-homatropine; amoxicillin; citalopram; and tramadol.  Laboratory Chemistry Profile   Renal Lab Results  Component Value Date   BUN 18 07/16/2019   CREATININE 0.71 07/17/2019   GFRAA >60 07/17/2019   GFRNONAA >  60 07/17/2019   PROTEINUR NEGATIVE  07/17/2019    Electrolytes Lab Results  Component Value Date   NA 141 07/16/2019   K 4.1 07/16/2019   CL 102 07/16/2019   CALCIUM 9.4 07/16/2019   MG 1.8 04/08/2018    Hepatic Lab Results  Component Value Date   AST 16 07/16/2019   ALT 8 07/16/2019   ALBUMIN 4.1 07/16/2019   ALKPHOS 67 07/16/2019   LIPASE 36 03/02/2018    ID Lab Results  Component Value Date   SARSCOV2NAA Not Detected 10/01/2019    Bone Lab Results  Component Value Date   25OHVITD1 50 04/08/2018   25OHVITD2 <1.0 04/08/2018   25OHVITD3 49 04/08/2018    Endocrine Lab Results  Component Value Date   GLUCOSE 112 (H) 07/16/2019   GLUCOSEU NEGATIVE 07/17/2019   HGBA1C 6.5 (H) 07/17/2019    Neuropathy Lab Results  Component Value Date   VITAMINB12 947 04/08/2018   HGBA1C 6.5 (H) 07/17/2019    CNS No results found.  Inflammation (CRP: Acute  ESR: Chronic) Lab Results  Component Value Date   CRP <0.8 07/16/2019   ESRSEDRATE 7 07/16/2019    Rheumatology No results found.  Coagulation Lab Results  Component Value Date   INR 0.94 07/30/2017   LABPROT 12.5 07/30/2017   APTT 34 07/30/2017   PLT 270 07/17/2019    Cardiovascular Lab Results  Component Value Date   TROPONINI <0.03 02/02/2018   HGB 12.9 07/17/2019   HCT 38.7 07/17/2019    Screening Lab Results  Component Value Date   SARSCOV2NAA Not Detected 10/01/2019    Cancer No results found.  Allergens No results found.     Note: Lab results reviewed.  Imaging Review  Thoracic Imaging: Thoracic DG 2-3 views:  Results for orders placed during the hospital encounter of 04/08/18  DG Thoracic Spine 2 View   Narrative CLINICAL DATA:  Thoracic radiculitis.  Acute upper back pain  EXAM: THORACIC SPINE 2 VIEWS  COMPARISON:  CT chest 03/04/2018  FINDINGS: Normal alignment. No fracture or mass. Mild disc degeneration and spurring throughout the thoracic spine  IMPRESSION: No acute abnormality.   Electronically Signed   By:  Franchot Gallo M.D.   On: 04/09/2018 08:07    Lumbosacral Imaging: Lumbar MR wo contrast:  Results for orders placed during the hospital encounter of 11/04/19  MR LUMBAR SPINE WO CONTRAST   Narrative CLINICAL DATA:  Remote lumbar spine surgery. Low back pain extending into the left lower extremity.  EXAM: MRI LUMBAR SPINE WITHOUT CONTRAST  TECHNIQUE: Multiplanar, multisequence MR imaging of the lumbar spine was performed. No intravenous contrast was administered.  COMPARISON:  MRI lumbar spine without contrast 01/14/2018. Lumbar spine radiographs 04/07/2018.  FINDINGS: Segmentation: 5 non rib-bearing lumbar type vertebral bodies are present. The lowest fully formed vertebral body is L5.  Alignment: No significant listhesis is present. Mild leftward curvature is centered at L3, stable  Vertebrae: Progressive edematous endplate marrow changes present at L2-3, L3-4, and L4-5. Changes are worse on the left at L3-4 on the right at L4-5.  Conus medullaris and cauda equina: Conus extends to the L2 level. Conus and cauda equina appear normal.  Paraspinal and other soft tissues: Left-sided renal cysts are again noted. Limited imaging of the abdomen is otherwise unremarkable.  Disc levels:  L1-2: Negative.  L2-3: A broad-based disc protrusion present. Moderate facet hypertrophy is noted bilaterally. Mild central and bilateral foraminal stenosis is stable.  L3-4: Broad-based disc protrusion is present. Is  slight progression of facet hypertrophy, right greater than left. Mild central canal narrowing is present. Moderate right and mild left foraminal stenosis is slightly worse.  L4-5: A broad-based disc protrusion and moderate facet hypertrophy has progressed. Progressive moderate central canal stenosis is evident with left greater than right subarticular narrowing. Moderate left and mild right foraminal narrowing is similar the prior study.  L5-S1: Chronic loss disc  height is again noted. Left subarticular narrowing is stable. Foramina are patent.  IMPRESSION: 1. Progressive spondylosis of the lumbar spine is most noted at L4-5. 2. Progressive moderate central canal stenosis at L4-5 with left greater than right subarticular narrowing. 3. Moderate left and mild right foraminal stenosis at L4-5. 4. Slight progression of foraminal narrowing at L3-4, right greater than left 5. Mild central canal stenosis at L3-4 is stable. 6. Mild central and bilateral foraminal stenosis at L2-3 is stable. 7. Left subarticular narrowing at L5-S1 is stable.   Electronically Signed   By: San Morelle M.D.   On: 11/05/2019 05:20    Lumbar DG Bending views:  Results for orders placed during the hospital encounter of 04/08/18  DG Lumbar Spine Complete W/Bend   Narrative CLINICAL DATA:  Back pain.  No known injury.  EXAM: LUMBAR SPINE - COMPLETE WITH BENDING VIEWS  COMPARISON:  MRI 01/14/2018.  FINDINGS: Diffuse severe multilevel degenerative change again noted. Mild lumbar scoliosis concave right. No acute bony abnormality identified. No flexion or extension abnormality identified. Aortoiliac and visceral atherosclerotic vascular disease. Pelvic calcifications consistent phleboliths.  IMPRESSION: 1. Diffuse multilevel degenerative change. Mild scoliosis concave right. No acute bony abnormality identified.  2.  Aortoiliac and visceral atherosclerotic vascular disease.   Electronically Signed   By: Marcello Moores  Register   On: 04/09/2018 08:07          Sacroiliac Joint Imaging: Sacroiliac Joint DG:  Results for orders placed during the hospital encounter of 09/08/17  DG Si Joints   Narrative CLINICAL DATA:  Intermittent pain in the low back for 3 weeks, no acute injury  EXAM: BILATERAL SACROILIAC JOINTS - 3+ VIEW  COMPARISON:  None.  FINDINGS: The SI joints appear well corticated with mild degenerative change present. No acute abnormality  is seen. The pelvic rami are intact. Only mild degenerative changes noted within the hip joint spaces.  IMPRESSION: 1. Mild degenerative change of the SI joints. No evidence of sacroiliitis. 2. Degenerative change in the lower lumbar spine with lesser degenerative change in the hips .   Electronically Signed   By: Ivar Drape M.D.   On: 09/09/2017 08:32    Complexity Note: Imaging results reviewed. Results shared with Ms. Albo, using Layman's terms.                        PFSH  Drug: Ms. Rickles  reports no history of drug use. Alcohol:  reports no history of alcohol use. Tobacco:  reports that she has never smoked. She has never used smokeless tobacco. Medical:  has a past medical history of Allergy, Cancer ([place]) (03/08/2018), Diabetes mellitus without complication ([place]), GERD (gastroesophageal reflux disease), HTN (hypertension), Hyperlipidemia, Shingles, and Stroke ([place]). Family: family history includes Stroke in her father and mother.  Past Surgical History:  Procedure Laterality Date  . APPENDECTOMY    . BACK SURGERY    . COLONOSCOPY WITH PROPOFOL N/A 03/06/2018   Procedure: COLONOSCOPY WITH PROPOFOL;  Surgeon: Virgel Manifold, MD;  Location: [facility] ENDOSCOPY;  Service: Endoscopy;  Laterality: N/A;  .  FLEXIBLE SIGMOIDOSCOPY Left 03/04/2018   Procedure: Colonoscopy/FLEXIBLE SIGMOIDOSCOPY;  Surgeon: Virgel Manifold, MD;  Location: [facility] ENDOSCOPY;  Service: Endoscopy;  Laterality: Left;  . FOOT SURGERY Bilateral    [place]  . JOINT REPLACEMENT    . KNEE SURGERY Bilateral   . TONSILECTOMY, ADENOIDECTOMY, BILATERAL MYRINGOTOMY AND TUBES     Active Ambulatory Problems    Diagnosis Date Noted  . Chronic myofascial pain 08/01/2016  . Esophageal reflux 03/01/2014  . Benign essential hypertension 03/01/2014  . Migraine 03/01/2014  . HLD (hyperlipidemia) 03/01/2014  . Pseudophakia, both eyes 10/30/2012  . Salzmann's nodular dystrophy 06/26/2012  . Type II or  unspecified type diabetes mellitus without mention of complication, not stated as uncontrolled 03/01/2014  . CVA (cerebral vascular accident) ([place]) 07/30/2017  . Diabetes mellitus type 2, uncomplicated ([place]) 16/07/9603  . Chronic low back pain (Primary Area of Pain) (Bilateral) (L>R) 09/08/2017  . Chronic sacroiliac joint pain (Bilateral) (L>R) 09/08/2017  . Chronic hip pain (Bilateral) (L>R) 09/08/2017  . Chronic pain syndrome 09/08/2017  . Osteoarthritis of sacroiliac joints (Bilateral) 09/17/2017  . Osteoarthritis of hip (Bilateral) 09/17/2017  . Osteoarthritis of lumbar spine 09/17/2017  . Lumbar spondylosis 09/17/2017  . Long term current use of anticoagulant (Plavix) 09/17/2017  . DDD (degenerative disc disease), lumbar 09/17/2017  . Lumbar facet hypertrophy 09/17/2017  . Lumbar facet syndrome (Bilateral) 09/18/2017  . Trochanteric bursitis of hip (Right) 10/20/2017  . Trigger point with back pain (buttocks area) (Right) 11/18/2017  . Groin pain (Right) 11/18/2017  . Spondylosis without myelopathy or radiculopathy, lumbosacral region 01/29/2018  . Lower GI bleed 03/02/2018  . GI bleed 03/03/2018  . Internal hemorrhoids   . Nodule of rectum   . Intestinal neoplasm   . Hematochezia   . Benign neoplasm of transverse colon 08/19/2019  . Polyp of sigmoid colon   . Intestinal lump   . Malignant neoplasm of rectum ([place]) 04/08/2018  . Diverticulosis of large intestine without diverticulitis   . Goals of care, counseling/discussion 03/13/2018  . Other specified dorsopathies, sacral and sacrococcygeal region 04/08/2018  . Pharmacologic therapy 04/08/2018  . Disorder of skeletal system 04/08/2018  . Problems influencing health status 04/08/2018  . Thoracic radiculitis (Right) 04/08/2018  . Acute low back pain without sciatica 04/08/2018  . Acute upper back pain (Right) 04/08/2018  . DDD (degenerative disc disease), thoracic 04/09/2018  . Stroke ([place]) 07/17/2019  . Stroke (cerebrum)  ([place]) 07/17/2019  . Colon cancer metastasized to lung ([place]) 08/18/2018  . GAD (generalized anxiety disorder) 08/18/2018  . History of CVA (cerebrovascular accident) 07/20/2019  . History of knee replacement, total, left 10/13/2019  . Metastasis to lung ([place]) 09/30/2018  . Nonrheumatic aortic valve insufficiency 08/19/2019  . PFO (patent foramen ovale) 08/19/2019  . Cerebrovascular accident (CVA) due to embolism of left middle cerebral artery ([place]) 08/10/2019  . Type 2 diabetes mellitus with hyperlipidemia ([place]) 03/01/2014  . Chronic knee pain after total replacement (Left) 12/23/2019  . Chronic knee pain (Left) 12/23/2019  . Lumbar foraminal stenosis (L4-5) (Left) 12/23/2019  . Lumbar radiculopathy (L4) (Left) 12/23/2019  . Chronic anticoagulation (Plavix) 12/23/2019   Resolved Ambulatory Problems    Diagnosis Date Noted  . No Resolved Ambulatory Problems   Past Medical History:  Diagnosis Date  . Allergy   . Cancer ([place]) 03/08/2018  . Diabetes mellitus without complication ([place])   . GERD (gastroesophageal reflux disease)   . HTN (hypertension)   . Hyperlipidemia   . Shingles    Assessment  Primary Diagnosis & Pertinent Problem List: The primary encounter diagnosis was Chronic knee pain after total replacement (Left). Diagnoses of Chronic knee pain (Left), Lumbar foraminal stenosis (L4-5) (Left), Lumbar radiculopathy (L4) (Left), DDD (degenerative disc disease), lumbar, and Chronic anticoagulation (Plavix) were also pertinent to this visit.  Visit Diagnosis (New problems to examiner): 1. Chronic knee pain after total replacement (Left)   2. Chronic knee pain (Left)   3. Lumbar foraminal stenosis (L4-5) (Left)   4. Lumbar radiculopathy (L4) (Left)   5. DDD (degenerative disc disease), lumbar   6. Chronic anticoagulation (Plavix)    Plan of Care (Initial workup plan)  Note: Ms. Butler was reminded that as per protocol, today's visit has been an evaluation only. We have not  taken over the patient's controlled substance management.  Problem-specific plan: No problem-specific Assessment & Plan notes found for this encounter.  Lab Orders  No laboratory test(s) ordered today   Imaging Orders  No imaging studies ordered today   Referral Orders  No referral(s) requested today    Procedure Orders     Lumbar Epidural Injection     Lumbar Transforaminal Epidural     GENICULAR NERVE BLOCK Pharmacotherapy (current): Medications ordered:  No orders of the defined types were placed in this encounter.    Pharmacological management options:  Opioid Analgesics: The patient was informed that there is no guarantee that she would be a candidate for opioid analgesics. The decision will be made following CDC guidelines. This decision will be based on the results of diagnostic studies, as well as Ms. Klee's risk profile.   Membrane stabilizer: To be determined at a later time  Muscle relaxant: To be determined at a later time  NSAID: To be determined at a later time  Other analgesic(s): To be determined at a later time   Interventional management options: Ms. Hannon was informed that there is no guarantee that she would be a candidate for interventional therapies. The decision will be based on the results of diagnostic studies, as well as Ms. Greenspan's risk profile.  Procedure(s) under consideration:  Diagnostic/therapeutic left L4 transforaminal ESI #1  Diagnostic/therapeutic left L4-5 interlaminar LESI #1  Diagnostic left genicular nerve block #1    Provider-requested follow-up: Return in about 1 week (around 12/30/2019) for Procedure (w/ sedation): (L) L4 TFESI #1 + (L) L4-5 LESI #1 + (L) GNB #1, (Blood-thinner Protocol).  Future Appointments  Date Time Provider [place]  12/23/2019  1:45 PM Milinda Pointer, MD [facility] None   Total duration of encounter: 25 minutes.  Primary Care Physician: Dion Body, MD Note by: Gaspar Cola,  MD Date: 12/23/2019; Time: 12:27 PM

## 2019-12-23 ENCOUNTER — Other Ambulatory Visit: Payer: Self-pay

## 2019-12-23 ENCOUNTER — Ambulatory Visit: Payer: Medicare PPO | Attending: Pain Medicine | Admitting: Pain Medicine

## 2019-12-23 DIAGNOSIS — Z96652 Presence of left artificial knee joint: Secondary | ICD-10-CM

## 2019-12-23 DIAGNOSIS — M5136 Other intervertebral disc degeneration, lumbar region: Secondary | ICD-10-CM

## 2019-12-23 DIAGNOSIS — M25562 Pain in left knee: Secondary | ICD-10-CM

## 2019-12-23 DIAGNOSIS — M5417 Radiculopathy, lumbosacral region: Secondary | ICD-10-CM

## 2019-12-23 DIAGNOSIS — G8929 Other chronic pain: Secondary | ICD-10-CM

## 2019-12-23 DIAGNOSIS — Z7901 Long term (current) use of anticoagulants: Secondary | ICD-10-CM

## 2019-12-23 DIAGNOSIS — M48061 Spinal stenosis, lumbar region without neurogenic claudication: Secondary | ICD-10-CM

## 2019-12-23 NOTE — Patient Instructions (Signed)
____________________________________________________________________________________________  Preparing for Procedure with Sedation  Procedure appointments are limited to planned procedures: . No Prescription Refills. . No disability issues will be discussed. . No medication changes will be discussed.  Instructions: . Oral Intake: Do not eat or drink anything for at least 8 hours prior to your procedure. . Transportation: Public transportation is not allowed. Bring an adult driver. The driver must be physically present in our waiting room before any procedure can be started. . Physical Assistance: Bring an adult physically capable of assisting you, in the event you need help. This adult should keep you company at home for at least 6 hours after the procedure. . Blood Pressure Medicine: Take your blood pressure medicine with a sip of water the morning of the procedure. . Blood thinners: Notify our staff if you are taking any blood thinners. Depending on which one you take, there will be specific instructions on how and when to stop it. . Diabetics on insulin: Notify the staff so that you can be scheduled 1st case in the morning. If your diabetes requires high dose insulin, take only  of your normal insulin dose the morning of the procedure and notify the staff that you have done so. . Preventing infections: Shower with an antibacterial soap the morning of your procedure. . Build-up your immune system: Take 1000 mg of Vitamin C with every meal (3 times a day) the day prior to your procedure. . Antibiotics: Inform the staff if you have a condition or reason that requires you to take antibiotics before dental procedures. . Pregnancy: If you are pregnant, call and cancel the procedure. . Sickness: If you have a cold, fever, or any active infections, call and cancel the procedure. . Arrival: You must be in the facility at least 30 minutes prior to your scheduled procedure. . Children: Do not bring  children with you. . Dress appropriately: Bring dark clothing that you would not mind if they get stained. . Valuables: Do not bring any jewelry or valuables.  Reasons to call and reschedule or cancel your procedure: (Following these recommendations will minimize the risk of a serious complication.) . Surgeries: Avoid having procedures within 2 weeks of any surgery. (Avoid for 2 weeks before or after any surgery). . Flu Shots: Avoid having procedures within 2 weeks of a flu shots or . (Avoid for 2 weeks before or after immunizations). . Barium: Avoid having a procedure within 7-10 days after having had a radiological study involving the use of radiological contrast. (Myelograms, Barium swallow or enema study). . Heart attacks: Avoid any elective procedures or surgeries for the initial 6 months after a "Myocardial Infarction" (Heart Attack). . Blood thinners: It is imperative that you stop these medications before procedures. Let us know if you if you take any blood thinner.  . Infection: Avoid procedures during or within two weeks of an infection (including chest colds or gastrointestinal problems). Symptoms associated with infections include: Localized redness, fever, chills, night sweats or profuse sweating, burning sensation when voiding, cough, congestion, stuffiness, runny nose, sore throat, diarrhea, nausea, vomiting, cold or Flu symptoms, recent or current infections. It is specially important if the infection is over the area that we intend to treat. . Heart and lung problems: Symptoms that may suggest an active cardiopulmonary problem include: cough, chest pain, breathing difficulties or shortness of breath, dizziness, ankle swelling, uncontrolled high or unusually low blood pressure, and/or palpitations. If you are experiencing any of these symptoms, cancel your procedure and contact   your primary care physician for an evaluation.  Remember:  Regular Business hours are:  Monday to Thursday  8:00 AM to 4:00 PM  Provider's Schedule: Chaniah Cisse, MD:  Procedure days: Tuesday and Thursday 7:30 AM to 4:00 PM  Bilal Lateef, MD:  Procedure days: Monday and Wednesday 7:30 AM to 4:00 PM ____________________________________________________________________________________________   ____________________________________________________________________________________________  Blood Thinners  IMPORTANT NOTICE:  If you take any of these, make sure to notify the nursing staff.  Failure to do so may result in injury.  Recommended time intervals to stop and restart blood-thinners, before & after invasive procedures  Generic Name Brand Name Stop Time. Must be stopped at least this long before procedures. After procedures, wait at least this long before re-starting.  Abciximab Reopro 15 days 2 hrs  Alteplase Activase 10 days 10 days  Anagrelide Agrylin    Apixaban Eliquis 3 days 6 hrs  Cilostazol Pletal 3 days 5 hrs  Clopidogrel Plavix 7-10 days 2 hrs  Dabigatran Pradaxa 5 days 6 hrs  Dalteparin Fragmin 24 hours 4 hrs  Dipyridamole Aggrenox 11days 2 hrs  Edoxaban Lixiana; Savaysa 3 days 2 hrs  Enoxaparin  Lovenox 24 hours 4 hrs  Eptifibatide Integrillin 8 hours 2 hrs  Fondaparinux  Arixtra 72 hours 12 hrs  Prasugrel Effient 7-10 days 6 hrs  Reteplase Retavase 10 days 10 days  Rivaroxaban Xarelto 3 days 6 hrs  Ticagrelor Brilinta 5-7 days 6 hrs  Ticlopidine Ticlid 10-14 days 2 hrs  Tinzaparin Innohep 24 hours 4 hrs  Tirofiban Aggrastat 8 hours 2 hrs  Warfarin Coumadin 5 days 2 hrs   Other medications with blood-thinning effects  Product indications Generic (Brand) names Note  Cholesterol Lipitor Stop 4 days before procedure  Blood thinner (injectable) Heparin (LMW or LMWH Heparin) Stop 24 hours before procedure  Cancer Ibrutinib (Imbruvica) Stop 7 days before procedure  Malaria/Rheumatoid Hydroxychloroquine (Plaquenil) Stop 11 days before procedure  Thrombolytics  10  days before or after procedures   Over-the-counter (OTC) Products with blood-thinning effects  Product Common names Stop Time  Aspirin > 325 mg Goody Powders, Excedrin, etc. 11 days  Aspirin ? 81 mg  7 days  Fish oil  4 days  Garlic supplements  7 days  Ginkgo biloba  36 hours  Ginseng  24 hours  NSAIDs Ibuprofen, Naprosyn, etc. 3 days  Vitamin E  4 days   ____________________________________________________________________________________________   

## 2019-12-30 ENCOUNTER — Ambulatory Visit
Admission: RE | Admit: 2019-12-30 | Discharge: 2019-12-30 | Disposition: A | Discharge status: Home / Self Care | Source: Ambulatory Visit | Attending: Pain Medicine | Admitting: Pain Medicine

## 2019-12-30 ENCOUNTER — Encounter: Payer: Self-pay | Admitting: Pain Medicine

## 2019-12-30 ENCOUNTER — Ambulatory Visit (HOSPITAL_BASED_OUTPATIENT_CLINIC_OR_DEPARTMENT_OTHER): Admitting: Pain Medicine

## 2019-12-30 ENCOUNTER — Other Ambulatory Visit: Payer: Self-pay

## 2019-12-30 VITALS — BP 125/60 | HR 81 | Temp 97.7°F | Resp 17 | Ht 60.0 in | Wt 121.0 lb

## 2019-12-30 DIAGNOSIS — M5417 Radiculopathy, lumbosacral region: Secondary | ICD-10-CM | POA: Insufficient documentation

## 2019-12-30 DIAGNOSIS — M25562 Pain in left knee: Secondary | ICD-10-CM | POA: Diagnosis present

## 2019-12-30 DIAGNOSIS — M47816 Spondylosis without myelopathy or radiculopathy, lumbar region: Secondary | ICD-10-CM | POA: Diagnosis present

## 2019-12-30 DIAGNOSIS — M545 Low back pain: Secondary | ICD-10-CM | POA: Diagnosis present

## 2019-12-30 DIAGNOSIS — M48061 Spinal stenosis, lumbar region without neurogenic claudication: Secondary | ICD-10-CM

## 2019-12-30 DIAGNOSIS — M5136 Other intervertebral disc degeneration, lumbar region: Secondary | ICD-10-CM | POA: Insufficient documentation

## 2019-12-30 DIAGNOSIS — G8929 Other chronic pain: Secondary | ICD-10-CM

## 2019-12-30 DIAGNOSIS — Z7901 Long term (current) use of anticoagulants: Secondary | ICD-10-CM | POA: Insufficient documentation

## 2019-12-30 DIAGNOSIS — Z96652 Presence of left artificial knee joint: Secondary | ICD-10-CM | POA: Insufficient documentation

## 2019-12-30 MED ORDER — METHYLPREDNISOLONE ACETATE 40 MG/ML IJ SUSP
40.0000 mg | Freq: Once | INTRAMUSCULAR | Status: AC
  Administered 2019-12-30: 40 mg

## 2019-12-30 MED ORDER — FENTANYL CITRATE (PF) 100 MCG/2ML IJ SOLN: 25.0000 ug | INTRAMUSCULAR | Status: DC | PRN

## 2019-12-30 MED ORDER — TRIAMCINOLONE ACETONIDE 40 MG/ML IJ SUSP
40.0000 mg | Freq: Once | INTRAMUSCULAR | Status: AC
  Administered 2019-12-30: 40 mg

## 2019-12-30 MED ORDER — MIDAZOLAM HCL 5 MG/5ML IJ SOLN
1.0000 mg | INTRAMUSCULAR | Status: DC | PRN
  Administered 2019-12-30: 51 mg via INTRAVENOUS

## 2019-12-30 MED ORDER — IOHEXOL 180 MG/ML  SOLN
INTRAMUSCULAR | Status: AC
  Filled 2019-12-30: qty 20

## 2019-12-30 MED ORDER — DEXAMETHASONE SODIUM PHOSPHATE 10 MG/ML IJ SOLN
INTRAMUSCULAR | Status: AC
  Filled 2019-12-30: qty 1

## 2019-12-30 MED ORDER — FENTANYL CITRATE (PF) 100 MCG/2ML IJ SOLN
INTRAMUSCULAR | Status: AC
  Filled 2019-12-30: qty 2

## 2019-12-30 MED ORDER — LACTATED RINGERS IV SOLN
1000.0000 mL | Freq: Once | INTRAVENOUS | Status: AC
  Administered 2019-12-30: 1000 mL via INTRAVENOUS

## 2019-12-30 MED ORDER — ROPIVACAINE HCL 2 MG/ML IJ SOLN
9.0000 mL | Freq: Once | INTRAMUSCULAR | Status: DC
  Filled 2019-12-30: qty 10

## 2019-12-30 MED ORDER — MIDAZOLAM HCL 5 MG/5ML IJ SOLN
INTRAMUSCULAR | Status: AC
  Filled 2019-12-30: qty 5

## 2019-12-30 MED ORDER — TRIAMCINOLONE ACETONIDE 40 MG/ML IJ SUSP
INTRAMUSCULAR | Status: AC
  Filled 2019-12-30: qty 1

## 2019-12-30 MED ORDER — SODIUM CHLORIDE (PF) 0.9 % IJ SOLN
INTRAMUSCULAR | Status: AC
  Filled 2019-12-30: qty 10

## 2019-12-30 MED ORDER — LIDOCAINE HCL 2 % IJ SOLN
20.0000 mL | Freq: Once | INTRAMUSCULAR | Status: AC
  Administered 2019-12-30: 400 mg

## 2019-12-30 MED ORDER — LIDOCAINE HCL 2 % IJ SOLN
INTRAMUSCULAR | Status: AC
  Filled 2019-12-30: qty 20

## 2019-12-30 MED ORDER — SODIUM CHLORIDE 0.9% FLUSH
1.0000 mL | Freq: Once | INTRAVENOUS | Status: AC
  Administered 2019-12-30: 10 mL

## 2019-12-30 MED ORDER — ROPIVACAINE HCL 2 MG/ML IJ SOLN
INTRAMUSCULAR | Status: AC
  Filled 2019-12-30: qty 10

## 2019-12-30 MED ORDER — SODIUM CHLORIDE 0.9% FLUSH: 2.0000 mL | Freq: Once | INTRAVENOUS | Status: DC

## 2019-12-30 MED ORDER — DEXAMETHASONE SODIUM PHOSPHATE 10 MG/ML IJ SOLN
10.0000 mg | Freq: Once | INTRAMUSCULAR | Status: AC
  Administered 2019-12-30: 10 mg

## 2019-12-30 MED ORDER — ROPIVACAINE HCL 2 MG/ML IJ SOLN: 2.0000 mL | Freq: Once | INTRAMUSCULAR | Status: DC

## 2019-12-30 MED ORDER — IOHEXOL 180 MG/ML  SOLN
10.0000 mL | Freq: Once | INTRAMUSCULAR | Status: AC
  Administered 2019-12-30: 10 mL via EPIDURAL

## 2019-12-30 MED ORDER — METHYLPREDNISOLONE ACETATE 40 MG/ML IJ SUSP
INTRAMUSCULAR | Status: AC
  Filled 2019-12-30: qty 1

## 2019-12-30 MED ORDER — ROPIVACAINE HCL 2 MG/ML IJ SOLN
1.0000 mL | Freq: Once | INTRAMUSCULAR | Status: AC
  Administered 2019-12-30: 10 mL via EPIDURAL

## 2019-12-30 NOTE — Patient Instructions (Signed)

## 2019-12-30 NOTE — Progress Notes (Signed)
Safety precautions to be maintained throughout the outpatient stay will include: orient to surroundings, keep bed in low position, maintain call bell within reach at all times, provide assistance with transfer out of bed and ambulation.  

## 2019-12-30 NOTE — Progress Notes (Signed)
PROVIDER NOTE: Information contained herein reflects review and annotations entered in association with encounter. Interpretation of such information and data should be left to medically-trained personnel. Information provided to patient can be located elsewhere in the medical record under "Patient Instructions". Document created using STT-dictation technology, any transcriptional errors that may result from process are unintentional.    Patient: Destiny [name]  Service Category: Procedure  Provider: Gaspar Cola, MD  DOB: Jun 04, 1924  DOS: 12/30/2019  Location: [facility]  MRN: [id number]  Setting: Ambulatory - outpatient  Referring Provider: Milinda Pointer, MD  Type: Established Patient  Specialty: Interventional Pain Management  PCP: Dion Body, MD   Primary Reason for Visit: Interventional Pain Management Treatment. CC: Leg Pain (left)  Procedure #1:  Anesthesia, Analgesia, Anxiolysis:  Type: Genicular Nerves Block (Superior-lateral, Superior-medial, and Inferior-medial Genicular Nerves) #1  CPT: [id number]      Primary Purpose: Diagnostic Region: Lateral, Anterior, and Medial aspects of the knee joint, above and below the knee joint proper. Level: Superior and inferior to the knee joint. Target Area: For Genicular Nerve block(s), the targets are: the superior-lateral genicular nerve, located in the lateral distal portion of the femoral shaft as it curves to form the lateral epicondyle, in the region of the distal femoral metaphysis; the superior-medial genicular nerve, located in the medial distal portion of the femoral shaft as it curves to form the medial epicondyle; and the inferior-medial genicular nerve, located in the medial, proximal portion of the tibial shaft, as it curves to form the medial epicondyle, in the region of the proximal tibial metaphysis. Approach: Anterior, percutaneous, ipsilateral approach. Laterality: Left knee  Type: Moderate (Conscious)  Sedation combined with Local Anesthesia Indication(s): Analgesia and Anxiety Route: Intravenous (IV) IV Access: Secured Sedation: Meaningful verbal contact was maintained at all times during the procedure  Local Anesthetic: Lidocaine 1-2%  Position: Modified Fowler's position with pillows under the targeted knee(s).   Indications: 1. Chronic knee pain (Left)   2. 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 Chronic anticoagulation (Plavix)   4. Long term current use of anticoagulant (Plavix)    Procedure #2:  Anesthesia, Analgesia, Anxiolysis:  Type: Therapeutic Trans-Foraminal Epidural Steroid Injection   #1  Region: Lumbar Level: L4 Paravertebral Laterality: Left Paravertebral   Type: Moderate (Conscious) Sedation combined with Local Anesthesia Indication(s): Analgesia and Anxiety Route: Intravenous (IV) IV Access: Secured Sedation: Meaningful verbal contact was maintained at all times during the procedure  Local Anesthetic: Lidocaine 1-2%  Position: Prone  Procedure #3:    Type: Therapeutic Inter-Laminar Epidural Steroid Injection   #1  Region: Lumbar Level: L4-5 Level. Laterality: Left-Sided Paramedial     Indications: 1. Lumbar radiculopathy (L4) (Left)   2. Lumbar foraminal stenosis (L4-5) (Left)   3. DDD (degenerative disc disease), lumbar   4. Osteoarthritis of lumbar spine   5. Chronic low back pain (Primary Area of Pain) (Bilateral) (L>R)    Pain Score: Pre-procedure: 10-Worst pain ever/10 Post-procedure: (P) 0-No pain/10   Pre-op Assessment:  Destiny [name] is a 86 y.o. (year old), female patient, seen today for interventional treatment. She  has a past surgical history that includes Knee surgery (Bilateral); Back surgery; Appendectomy; Tonsilectomy, adenoidectomy, bilateral myringotomy and tubes; Foot surgery (Bilateral); Flexible sigmoidoscopy (Left, 03/04/2018); Joint replacement; and Colonoscopy with propofol (N/A, 03/06/2018). Destiny [name] has a current  medication list which includes the following prescription(s): amlodipine, aspirin, clopidogrel, gabapentin, guaifenesin-codeine, hydrochlorothiazide, hydrocodone-acetaminophen, ipratropium-albuterol, levsin/sl, lorazepam, metformin, metoprolol succinate, mirtazapine, morphine concentrate,  oxytrol, prochlorperazine, telmisartan, and trazodone, and the following Facility-Administered Medications: fentanyl, midazolam, ropivacaine (pf) 2 mg/ml (0.2%), ropivacaine (pf) 2 mg/ml (0.2%), and sodium chloride flush. Her primarily concern today is the Leg Pain (left)  Initial Vital Signs:  Pulse/HCG Rate: 81ECG Heart Rate: 78 Temp: (!) 97.3 F (36.3 C) Resp: 18 BP: 119/62 SpO2: 92 %  BMI: Estimated body mass index is 23.63 kg/m as calculated from the following:   Height as of this encounter: 5' (1.524 m).   Weight as of this encounter: 121 lb (54.9 kg).  Risk Assessment: Allergies: Reviewed. She is allergic to penicillins; ampicillin; amoxicillin; citalopram; sulfamethoxazole-trimethoprim; and tramadol.  Allergy Precautions: None required Coagulopathies: Reviewed. None identified.  Blood-thinner therapy: None at this time Active Infection(s): Reviewed. None identified. Destiny [name] is afebrile  Site Confirmation: Destiny [name] was asked to confirm the procedure and laterality before marking the site Procedure checklist: Completed Consent: Before the procedure and under the influence of no sedative(s), amnesic(s), or anxiolytics, the patient was informed of the treatment options, risks and possible complications. To fulfill our ethical and legal obligations, as recommended by the American Medical Association's Code of Ethics, I have informed the patient of my clinical impression; the nature and purpose of the treatment or procedure; the risks, benefits, and possible complications of the intervention; the alternatives, including doing nothing; the risk(s) and benefit(s) of the alternative treatment(s) or  procedure(s); and the risk(s) and benefit(s) of doing nothing. The patient was provided information about the general risks and possible complications associated with the procedure. These may include, but are not limited to: failure to achieve desired goals, infection, bleeding, organ or nerve damage, allergic reactions, paralysis, and death. In addition, the patient was informed of those risks and complications associated to the procedure, such as failure to decrease pain; infection; bleeding; organ or nerve damage with subsequent damage to sensory, motor, and/or autonomic systems, resulting in permanent pain, numbness, and/or weakness of one or several areas of the body; allergic reactions; (i.e.: anaphylactic reaction); and/or death. Furthermore, the patient was informed of those risks and complications associated with the medications. These include, but are not limited to: allergic reactions (i.e.: anaphylactic or anaphylactoid reaction(s)); adrenal axis suppression; blood sugar elevation that in diabetics may result in ketoacidosis or comma; water retention that in patients with history of congestive heart failure may result in shortness of breath, pulmonary edema, and decompensation with resultant heart failure; weight gain; swelling or edema; medication-induced neural toxicity; particulate matter embolism and blood vessel occlusion with resultant organ, and/or nervous system infarction; and/or aseptic necrosis of one or more joints. Finally, the patient was informed that Medicine is not an exact science; therefore, there is also the possibility of unforeseen or unpredictable risks and/or possible complications that may result in a catastrophic outcome. The patient indicated having understood very clearly. We have given the patient no guarantees and we have made no promises. Enough time was given to the patient to ask questions, all of which were answered to the patient's satisfaction. Ms. Hannasch has  indicated that she wanted to continue with the procedure. Attestation: I, the ordering provider, attest that I have discussed with the patient the benefits, risks, side-effects, alternatives, likelihood of achieving goals, and potential problems during recovery for the procedure that I have provided informed consent. Date  Time: 12/30/2019  9:16 AM  Pre-Procedure Preparation:  Monitoring: As per clinic protocol. Respiration, ETCO2, SpO2, BP, heart rate and rhythm monitor placed and checked for adequate function Safety Precautions: Patient  was assessed for positional comfort and pressure points before starting the procedure. Time-out: I initiated and conducted the "Time-out" before starting the procedure, as per protocol. The patient was asked to participate by confirming the accuracy of the "Time Out" information. Verification of the correct person, site, and procedure were performed and confirmed by me, the nursing staff, and the patient. "Time-out" conducted as per Joint Commission's Universal Protocol (UP.01.01.01). Time: 1013  Description of Procedure #1:  Area Prepped: Entire knee area, from mid-thigh to mid-shin, lateral, anterior, and medial aspects. Prepping solution: DuraPrep (Iodine Povacrylex [0.7% available iodine] and Isopropyl Alcohol, 74% w/w) Safety Precautions: Aspiration looking for blood return was conducted prior to all injections. At no point did we inject any substances, as a needle was being advanced. No attempts were made at seeking any paresthesias. Safe injection practices and needle disposal techniques used. Medications properly checked for expiration dates. SDV (single dose vial) medications used. Description of the Procedure: Protocol guidelines were followed. The patient was placed in position over the procedure table. The target area was identified and the area prepped in the usual manner. Skin & deeper tissues infiltrated with local anesthetic. Appropriate amount of time  allowed to pass for local anesthetics to take effect. The procedure needles were then advanced to the target area. Proper needle placement secured. Negative aspiration confirmed. Solution injected in intermittent fashion, asking for systemic symptoms every 0.5cc of injectate. The needles were then removed and the area cleansed, making sure to leave some of the prepping solution back to take advantage of its long term bactericidal properties.  Vitals:   12/30/19 1041 12/30/19 1047 12/30/19 1057 12/30/19 1113  BP: (!) 144/75 129/62 125/60 (P) 133/73  Pulse:    (P) 78  Resp: 20 18 17  (P) 18  Temp:  97.7 F (36.5 C)  (!) (P) 97.5 F (36.4 C)  TempSrc:  Temporal  (P) Temporal  SpO2: 100% 100% 99% (P) 97%  Weight:      Height:        Start Time: 1013 hrs. End Time: 1040 hrs. Materials:  Needle(s) Type: Spinal Needle Gauge: 22G Length: 3.5-in Medication(s): Please see orders for medications and dosing details.  Imaging Guidance (Non-Spinal) for procedure #1:  Type of Imaging Technique: Fluoroscopy Guidance (Non-Spinal) Indication(s): Assistance in needle guidance and placement for procedures requiring needle placement in or near specific anatomical locations not easily accessible without such assistance. Exposure Time: Please see nurses notes. Contrast: None used. Fluoroscopic Guidance: I was personally present during the use of fluoroscopy. "Tunnel Vision Technique" used to obtain the best possible view of the target area. Parallax error corrected before commencing the procedure. "Direction-depth-direction" technique used to introduce the needle under continuous pulsed fluoroscopy. Once target was reached, antero-posterior, oblique, and lateral fluoroscopic projection used confirm needle placement in all planes. Images permanently stored in EMR. Interpretation: No contrast injected. I personally interpreted the imaging intraoperatively. Adequate needle placement confirmed in multiple planes.  Permanent images saved into the patient's record.  Description of Procedure #2:  Target Area: The inferior and lateral portion of the pedicle, just lateral to a line created by the 6:00 position of the pedicle and the superior articular process of the vertebral body below. On the lateral view, this target lies just posterior to the anterior aspect of the lamina and posterior to the midpoint created between the anterior and the posterior aspect of the neural foramina. Approach: Posterior paravertebral approach. Area Prepped: Entire Posterior Lumbosacral Region Prepping solution: DuraPrep (Iodine Povacrylex [  0.7% available iodine] and Isopropyl Alcohol, 74% w/w) Safety Precautions: Aspiration looking for blood return was conducted prior to all injections. At no point did we inject any substances, as a needle was being advanced. No attempts were made at seeking any paresthesias. Safe injection practices and needle disposal techniques used. Medications properly checked for expiration dates. SDV (single dose vial) medications used.  Description of the Procedure: Protocol guidelines were followed. The patient was placed in position over the fluoroscopy table. The target area was identified and the area prepped in the usual manner. Skin & deeper tissues infiltrated with local anesthetic. Appropriate amount of time allowed to pass for local anesthetics to take effect. The procedure needles were then advanced to the target area. Proper needle placement secured. Negative aspiration confirmed. Solution injected in intermittent fashion, asking for systemic symptoms every 0.2cc of injectate. The needles were then removed and the area cleansed, making sure to leave some of the prepping solution back to take advantage of its long term bactericidal properties.  Start Time: 1013 hrs.  Materials:  Needle(s) Type: Spinal Needle Gauge: 22G Length: 3.5-in Medication(s): Please see orders for medications and dosing  details.  Description of Procedure #3:  Target Area: The  interlaminar space, initially targeting the lower border of the superior vertebral body lamina. Approach: Posterior paramedial approach. Area Prepped: Same as above Prepping solution: Same as above Safety Precautions: Same as above  Description of the Procedure: Protocol guidelines were followed. The patient was placed in position over the fluoroscopy table. The target area was identified and the area prepped in the usual manner. Skin & deeper tissues infiltrated with local anesthetic. Appropriate amount of time allowed to pass for local anesthetics to take effect. The procedure needle was introduced through the skin, ipsilateral to the reported pain, and advanced to the target area. Bone was contacted and the needle walked caudad, until the lamina was cleared. The ligamentum flavum was engaged and loss-of-resistance technique used as the epidural needle was advanced. The epidural space was identified using "loss-of-resistance technique" with 2-3 ml of PF-NaCl (0.9% NSS), in a 5cc LOR glass syringe. Proper needle placement secured. Negative aspiration confirmed. Solution injected in intermittent fashion, asking for systemic symptoms every 0.5cc of injectate. The needles were then removed and the area cleansed, making sure to leave some of the prepping solution back to take advantage of its long term bactericidal properties.  Vitals:   12/30/19 1041 12/30/19 1047 12/30/19 1057 12/30/19 1113  BP: (!) 144/75 129/62 125/60 (P) 133/73  Pulse:    (P) 78  Resp: 20 18 17  (P) 18  Temp:  97.7 F (36.5 C)  (!) (P) 97.5 F (36.4 C)  TempSrc:  Temporal  (P) Temporal  SpO2: 100% 100% 99% (P) 97%  Weight:      Height:        End Time: 1040 hrs.  Materials:  Needle(s) Type: Epidural needle Gauge: 17G Length: 3.5-in Medication(s): Please see orders for medications and dosing details.  Imaging Guidance (Spinal) for procedure #2 & 3:  Type of  Imaging Technique: Fluoroscopy Guidance (Spinal) Indication(s): Assistance in needle guidance and placement for procedures requiring needle placement in or near specific anatomical locations not easily accessible without such assistance. Exposure Time: Please see nurses notes. Contrast: Before injecting any contrast, we confirmed that the patient did not have an allergy to iodine, shellfish, or radiological contrast. Once satisfactory needle placement was completed at the desired level, radiological contrast was injected. Contrast injected under live fluoroscopy.  No contrast complications. See chart for type and volume of contrast used. Fluoroscopic Guidance: I was personally present during the use of fluoroscopy. "Tunnel Vision Technique" used to obtain the best possible view of the target area. Parallax error corrected before commencing the procedure. "Direction-depth-direction" technique used to introduce the needle under continuous pulsed fluoroscopy. Once target was reached, antero-posterior, oblique, and lateral fluoroscopic projection used confirm needle placement in all planes. Images permanently stored in EMR. Interpretation: I personally interpreted the imaging intraoperatively. Adequate needle placement confirmed in multiple planes. Appropriate spread of contrast into desired area was observed. No evidence of afferent or efferent intravascular uptake. No intrathecal or subarachnoid spread observed. Permanent images saved into the patient's record.  Antibiotic Prophylaxis:   Anti-infectives (From admission, onward)   None     Indication(s): None identified  Post-operative Assessment:  Post-procedure Vital Signs:  Pulse/HCG Rate: (P) 7873 Temp: (!) (P) 97.5 F (36.4 C) Resp: (P) 18 BP: (P) 133/73 SpO2: (P) 97 %  EBL: None  Complications: No immediate post-treatment complications observed by team, or reported by patient.  Note: The patient tolerated the entire procedure well. A  repeat set of vitals were taken after the procedure and the patient was kept under observation following institutional policy, for this type of procedure. Post-procedural neurological assessment was performed, showing return to baseline, prior to discharge. The patient was provided with post-procedure discharge instructions, including a section on how to identify potential problems. Should any problems arise concerning this procedure, the patient was given instructions to immediately contact us, at any time, without hesitation. In any case, we plan to contact the patient by telephone for a follow-up status report regarding this interventional procedure.  Comments:  No additional relevant information.  Plan of Care  Orders:  Orders Placed This Encounter  Procedures  . Lumbar Epidural Injection    Scheduling Instructions:     Procedure: Interlaminar LESI L4-5     Laterality: Left-sided     Sedation: Patient's choice     Timeframe:  Today    Order Specific Question:   Where will this procedure be performed?    Answer:   [facility]  . Lumbar Transforaminal Epidural    Scheduling Instructions:     Side: Left-sided     Level: L4     Sedation: With Sedation.     Timeframe: Today    Order Specific Question:   Where will this procedure be performed?    Answer:   [facility]  . GENICULAR NERVE BLOCK    Scheduling Instructions:     Side(s): Left Knee     Level(s): Superior-Lateral, Superior-Medial, and Inferior-Medial Genicular Nerves     Sedation: With Sedation.     Timeframe: Today    Order Specific Question:   Where will this procedure be performed?    Answer:   [facility]  . DG PAIN CLINIC C-ARM 1-60 MIN NO REPORT    Intraoperative interpretation by procedural physician at [place].    Standing Status:   Standing    Number of Occurrences:   1    Order Specific Question:   Reason for exam:    Answer:   Assistance in needle guidance and placement  for procedures requiring needle placement in or near specific anatomical locations not easily accessible without such assistance.  . Informed Consent Details: Physician/Practitioner Attestation; Transcribe to consent form and obtain patient signature    Provider Attestation: I, [place] Dossie Arbour, MD, (Pain Management  Specialist), the physician/practitioner, attest that I have discussed with the patient the benefits, risks, side effects, alternatives, likelihood of achieving goals and potential problems during recovery for the procedure that I have provided informed consent.    Scheduling Instructions:     Procedure: Lumbar epidural steroid injection under fluoroscopic guidance     Indications: Low back and/or lower extremity pain secondary to lumbar radiculitis     Note: Always confirm laterality of pain with Ms. Archila, before procedure.     Transcribe to consent form and obtain patient signature.  . Informed Consent Details: Physician/Practitioner Attestation; Transcribe to consent form and obtain patient signature    Provider Attestation: I, [place] Dossie Arbour, MD, (Pain Management Specialist), the physician/practitioner, attest that I have discussed with the patient the benefits, risks, side effects, alternatives, likelihood of achieving goals and potential problems during recovery for the procedure that I have provided informed consent.    Scheduling Instructions:     Procedure: Diagnostic lumbar transforaminal epidural steroid injection under fluoroscopic guidance. (See notes for level and laterality.)     Indication/Reason: Lumbar radiculopathy/radiculitis associated with lumbar stenosis     Note: Always confirm laterality of pain with Ms. Ficco, before procedure.     Transcribe to consent form and obtain patient signature.  . Informed Consent Details: Physician/Practitioner Attestation; Transcribe to consent form and obtain patient signature    Provider Attestation: I, [place]  Dossie Arbour, MD, (Pain Management Specialist), the physician/practitioner, attest that I have discussed with the patient the benefits, risks, side effects, alternatives, likelihood of achieving goals and potential problems during recovery for the procedure that I have provided informed consent.    Scheduling Instructions:     Procedure: Block of the genicular nerves of the knee (Genicular Nerves Block)     Indications: Chronic knee pain     Note: Always confirm laterality of pain with Ms. Farewell, before procedure.     Transcribe to consent form and obtain patient signature.  . Care order/instruction: Please confirm that the patient has stopped the Plavix (Clopidogrel) x 7-10 days prior to procedure or surgery.    Please confirm that the patient has stopped the Plavix (Clopidogrel) x 7-10 days prior to procedure or surgery.    Standing Status:   Standing    Number of Occurrences:   1  . Provide equipment / supplies at bedside    Equipment required: Single use, disposable, "Epidural Tray" Epidural Catheter: NOT required    Standing Status:   Standing    Number of Occurrences:   1    Order Specific Question:   Specify    Answer:   Epidural Tray  . Bleeding precautions    Standing Status:   Standing    Number of Occurrences:   1   Chronic Opioid Analgesic:  Hydrocodone/APAP 5/325, 1 tab PO q 4 hrs (30 mg/day of hydrocodone) (30 MME/day) (last prescribed on 12/19/2019 by Dr. Bess Harvest, [facility]) Highest recorded MME/day: 30 mg/day MME/day: 30 mg/day   Medications ordered for procedure: Meds ordered this encounter  Medications  . iohexol (OMNIPAQUE) 180 MG/ML injection 10 mL    Must be Myelogram-compatible. If not available, you may substitute with a water-soluble, non-ionic, hypoallergenic, myelogram-compatible radiological contrast medium.  Marland Kitchen lidocaine (XYLOCAINE) 2 % (with pres) injection 400 mg  . lactated ringers infusion 1,000 mL  . midazolam (VERSED) 5 MG/5ML injection 1-2  mg    Make sure Flumazenil is available in the pyxis when using this medication. If  oversedation occurs, administer 0.2 mg IV over 15 sec. If after 45 sec no response, administer 0.2 mg again over 1 min; may repeat at 1 min intervals; not to exceed 4 doses (1 mg)  . fentaNYL (SUBLIMAZE) injection 25-50 mcg    Make sure Narcan is available in the pyxis when using this medication. In the event of respiratory depression (RR< 8/min): Titrate NARCAN (naloxone) in increments of 0.1 to 0.2 mg IV at 2-3 minute intervals, until desired degree of reversal.  . ropivacaine (PF) 2 mg/mL (0.2%) (NAROPIN) injection 9 mL  . methylPREDNISolone acetate (DEPO-MEDROL) injection 40 mg  . sodium chloride flush (NS) 0.9 % injection 2 mL  . ropivacaine (PF) 2 mg/mL (0.2%) (NAROPIN) injection 2 mL  . triamcinolone acetonide (KENALOG-40) injection 40 mg  . sodium chloride flush (NS) 0.9 % injection 1 mL  . ropivacaine (PF) 2 mg/mL (0.2%) (NAROPIN) injection 1 mL  . dexamethasone (DECADRON) injection 10 mg   Medications administered: We administered iohexol, lidocaine, lactated ringers, midazolam, fentaNYL, methylPREDNISolone acetate, triamcinolone acetonide, sodium chloride flush, ropivacaine (PF) 2 mg/mL (0.2%), and dexamethasone.  See the medical record for exact dosing, route, and time of administration.  Follow-up plan:   Return in about 2 weeks (around 01/13/2020) for (VV), (PP).        Interventional treatment options: Planned, scheduled, and/or pending:   Note: For the purpose of follow-up documentation, I drew a line on the left knee the right and the right side from the left side and another horizontal line dividing the upper from the lower portion of the knee.  The upper inner quadrant was labeled 1, the upper outer quadrant was labeled to, the inner lower quadrant was labeled 3 and the outer lower quadrant #4.   Under consideration:   Diagnostic/therapeutic left L4 TESI #2  Diagnostic/therapeutic left L4-5  interlaminar LESI #2  Diagnostic left genicular NB #2    Therapeutic/palliative (PRN):   Therapeutic right L5-S1 LESI #2  Therapeutic right SI joint block #3  Therapeutic/bilateral right IA hip injection #2  Therapeutic right trochanteric bursa injection #2  Therapeutic right gluteus medius muscle MNB/TPI #2  Diagnostic right lumbar facet block #2  Therapeutic right T8-9 thoracic ESI #2     Recent Visits Date Type Provider Dept  12/23/19 Office Visit Milinda Pointer, MD [facility]  Showing recent visits within past 90 days and meeting all other requirements   Today's Visits Date Type Provider Dept  12/30/19 Procedure visit Milinda Pointer, MD [facility]  Showing today's visits and meeting all other requirements   Future Appointments Date Type Provider Dept  01/13/20 Appointment Milinda Pointer, MD [facility]  Showing future appointments within next 90 days and meeting all other requirements   Disposition: Discharge home  Discharge (Date  Time): 12/30/2019; 1134 hrs.   Primary Care Physician: Dion Body, MD Location: [facility] Note by: Gaspar Cola, MD Date: 12/30/2019; Time: 12:56 PM  Disclaimer:  Medicine is not an Chief Strategy Officer. The only guarantee in medicine is that nothing is guaranteed. It is important to note that the decision to proceed with this intervention was based on the information collected from the patient. The Data and conclusions were drawn from the patient's questionnaire, the interview, and the physical examination. Because the information was provided in large part by the patient, it cannot be guaranteed that it has not been purposely or unconsciously manipulated. Every effort has been made to obtain as much relevant data  as possible for this evaluation. It is important to note that the conclusions that lead to this procedure are derived in large part from the available data.  Always take into account that the treatment will also be dependent on availability of resources and existing treatment guidelines, considered by other Pain Management Practitioners as being common knowledge and practice, at the time of the intervention. For Medico-Legal purposes, it is also important to point out that variation in procedural techniques and pharmacological choices are the acceptable norm. The indications, contraindications, technique, and results of the above procedure should only be interpreted and judged by a Board-Certified Interventional Pain Specialist with extensive familiarity and expertise in the same exact procedure and technique.

## 2019-12-31 ENCOUNTER — Telehealth: Payer: Self-pay

## 2019-12-31 NOTE — Telephone Encounter (Signed)
Post procedure phone call.  Spoke with daughter.  Patient is still sleeping.  Dughter states patient did very well yesterday and would call us for any concerns.

## 2020-01-10 NOTE — Progress Notes (Signed)
Pain relief after procedure (treated area only): (Questions asked to patient) 1. Starting about 15 minutes after the procedure, and "while the area was still numb" (from the local anesthetics), were you having any of your usual pain "in that area" (the treated area)?  (NOTE: NOT including the discomfort from the needle sticks.) First 1 hour:10 % better. First 4-6 hours: 50 % better. 3. How much better is your pain now, when compared to before the procedure? Current benefit: 50 % better. Lasting a couple of days.  4. Can you move better now? Improvement in ROM (Range of Motion): No. 5. Can you do more now? Improvement in function: No. 4. Did you have any problems with the procedure? Side-effects/Complications: No.

## 2020-01-11 ENCOUNTER — Other Ambulatory Visit: Payer: Self-pay

## 2020-01-11 ENCOUNTER — Ambulatory Visit (HOSPITAL_BASED_OUTPATIENT_CLINIC_OR_DEPARTMENT_OTHER): Admitting: Pain Medicine

## 2020-01-11 ENCOUNTER — Ambulatory Visit
Admission: RE | Admit: 2020-01-11 | Discharge: 2020-01-11 | Disposition: A | Discharge status: Home / Self Care | Source: Ambulatory Visit | Attending: Pain Medicine | Admitting: Pain Medicine

## 2020-01-11 DIAGNOSIS — Z7901 Long term (current) use of anticoagulants: Secondary | ICD-10-CM

## 2020-01-11 DIAGNOSIS — M25552 Pain in left hip: Secondary | ICD-10-CM | POA: Insufficient documentation

## 2020-01-11 DIAGNOSIS — G8929 Other chronic pain: Secondary | ICD-10-CM | POA: Diagnosis not present

## 2020-01-11 DIAGNOSIS — M25551 Pain in right hip: Secondary | ICD-10-CM | POA: Diagnosis not present

## 2020-01-11 NOTE — Progress Notes (Signed)
Patient: Destiny [name]  Service Category: E/M  Provider: Gaspar Cola, MD  DOB: 1924-08-28  DOS: 01/11/2020  Location: Office  MRN: [id number]  Setting: Ambulatory outpatient  Referring Provider: Dion Body, MD  Type: Established Patient  Specialty: Interventional Pain Management  PCP: Dion Body, MD  Location: Remote location  Delivery: TeleHealth     Virtual Encounter - Pain Management PROVIDER NOTE: Information contained herein reflects review and annotations entered in association with encounter. Interpretation of such information and data should be left to medically-trained personnel. Information provided to patient can be located elsewhere in the medical record under "Patient Instructions". Document created using STT-dictation technology, any transcriptional errors that may result from process are unintentional.    Contact & Pharmacy Preferred: [phone] Home: [phone] (home) Mobile: [phone] (mobile) E-mail: [email or web address]  [place], [place] - [address] [place] [address] Phone: [phone] Fax: [place], [place] [place] [address] Phone: [phone] Fax: [phone]  [place], [place] - [place] [address] Phone: [phone] Fax: [phone]   Pre-screening  Destiny [name] offered "in-person" vs "virtual" encounter. She indicated preferring virtual for this encounter.   Reason COVID-19*  Social distancing based on CDC and AMA recommendations.   I contacted Destiny [name] on 01/11/2020 via telephone.      I clearly identified myself as Gaspar Cola, MD. I verified that I was speaking with the correct person using two identifiers (Name: Destiny [name], and date of birth: 1924/06/01).  Consent I sought verbal advanced consent from Destiny [name] for virtual visit interactions. I informed Destiny [name] of possible security and privacy concerns, risks, and limitations associated with providing "not-in-person" medical evaluation and management services. I also informed Destiny [name] of the availability of "in-person" appointments. Finally, I informed her that there would be a charge for the virtual visit and that she could be  personally, fully or partially, financially responsible for it. Destiny [name] expressed understanding and agreed to proceed.   Historic Elements   Destiny [name] is a 86 y.o. year old, female patient evaluated today after her last contact with our practice on 12/31/2019. Destiny [name]  has a past medical history of Allergy, Cancer ([place]) (03/08/2018), Diabetes mellitus without complication ([place]), GERD (gastroesophageal reflux disease), HTN (hypertension), Hyperlipidemia, Shingles, and Stroke ([place]). She also  has a past surgical history that includes Knee surgery (Bilateral); Back surgery; Appendectomy; Tonsilectomy, adenoidectomy, bilateral myringotomy and tubes; Foot surgery (Bilateral); Flexible sigmoidoscopy (Left, 03/04/2018); Joint replacement; and Colonoscopy with propofol (N/A, 03/06/2018). Destiny [name] has a current medication list which includes the following prescription(s): amlodipine, aspirin, clopidogrel, guaifenesin-codeine, hydrochlorothiazide, hydrocodone-acetaminophen, ipratropium-albuterol, levsin/sl, lorazepam, metformin, metoprolol succinate, mirtazapine, morphine concentrate, oxytrol, prochlorperazine, telmisartan, trazodone, and gabapentin. She  reports that she has never smoked. She has never used smokeless tobacco. She reports that she does not drink alcohol or use drugs. Destiny [name] is allergic to penicillins; ampicillin; amoxicillin; citalopram; sulfamethoxazole-trimethoprim; and tramadol.   HPI  Today, she is being contacted for a post-procedure assessment.  Today I had a very long conversation with the patient's  daughter about the results of this procedure and the patient's pain.  One thing that has come across incredibly clear today is the fact that Destiny [name] at age 52 is having a lot of difficulty explaining where her pain  is located.  In addition, I think the fact that she had a stroke in the past, is contributing to her cognitive impairment.  Today she was complaining of a lot of pain when standing and after multiple questions and realizing that she was getting confused, we decided to simplify things for her and S it turns out, her pain is located in her left hip and is really not traveling down the leg at all.  This would suggest that the procedure that we did was highly effective in controlling the pain from the knee and that pain going all the way down the leg from the radicular component of her pain.  However, she does have pain that appears to now be centralized in her left hip.  When she is sitting and not placing any pressure over the hip, she is completely pain-free.  However when she attempts to put her weight on her left hip than it causes a great deal of pain.  Surprisingly, there are no x-rays of her hip and therefore today I will be ordering x-rays of both of her hips to review her current status.  At age 55, it is very likely that she will have significant osteoarthritis affecting that hip.  I have tentatively schedule her for a left intra-articular hip injection as well.  Post-Procedure Evaluation  Procedure (12/30/2019): Diagnostic left genicular nerve block #1 + therapeutic left L4 TFESI #1 + therapeutic left L4-5 LESI #1 under fluoroscopic guidance and IV sedation Pre-procedure pain level: 10/10 Post-procedure: 0/10 (100% relief)  Sedation: Sedation provided.  Destiny Shorter, Destiny [name]  01/10/2020 10:38 AM  Sign when Signing Visit Pain relief after procedure (treated area only): (Questions asked to patient) 1. Starting about 15 minutes after the procedure, and "while the area was still numb" (from the  local anesthetics), were you having any of your usual pain "in that area" (the treated area)?  (NOTE: NOT including the discomfort from the needle sticks.) First 1 hour: 100 % better.  In the area of the knee and the leg, but not to hip. First 4-6 hours: 50 % better. 3. How much better is your pain now, when compared to before the procedure? Current benefit: 50 % better. Lasting a couple of days.  4. Can you move better now? Improvement in ROM (Range of Motion): No.  This is primarily in the area of the hip. 5. Can you do more now? Improvement in function: No.  On her hip. 4. Did you have any problems with the procedure? Side-effects/Complications: No.  Current benefits: Defined as benefit that persist at this time.   Analgesia:  50% improved of her entire leg.  However, when it comes to her knee, she seems to be doing much better.  In terms of her leg pain this also seems to be better, except for the area of the hip where it continues to give her quite a bit of pain. Function: Back to baseline ROM: Back to baseline  Pharmacotherapy Assessment  Analgesic: Hydrocodone/APAP 5/325, 1 tab PO q 4 hrs (30 mg/day of hydrocodone) (30 MME/day) (last prescribed on 12/19/2019 by Dr. Bess Harvest, [facility]) Highest recorded MME/day: 30 mg/day MME/day: 30 mg/day   Monitoring: [place] PMP: PDMP not reviewed this encounter.       Pharmacotherapy: No side-effects or adverse reactions reported. Compliance: No problems identified. Effectiveness: Clinically acceptable. Plan: Refer to "POC".  UDS: No results found for: SUMMARY Laboratory Chemistry Profile   Renal Lab Results  Component Value  Date   BUN 18 07/16/2019   CREATININE 0.71 07/17/2019   GFRAA >60 07/17/2019   GFRNONAA >60 07/17/2019     Hepatic Lab Results  Component Value Date   AST 16 07/16/2019   ALT 8 07/16/2019   ALBUMIN 4.1 07/16/2019   ALKPHOS 67 07/16/2019   LIPASE 36 03/02/2018     Electrolytes Lab Results   Component Value Date   NA 141 07/16/2019   K 4.1 07/16/2019   CL 102 07/16/2019   CALCIUM 9.4 07/16/2019   MG 1.8 04/08/2018     Bone Lab Results  Component Value Date   25OHVITD1 50 04/08/2018   25OHVITD2 <1.0 04/08/2018   25OHVITD3 49 04/08/2018     Inflammation (CRP: Acute Phase) (ESR: Chronic Phase) Lab Results  Component Value Date   CRP <0.8 07/16/2019   ESRSEDRATE 7 07/16/2019       Note: Above Lab results reviewed.  Imaging  DG PAIN CLINIC C-ARM 1-60 MIN NO REPORT Fluoro was used, but no Radiologist interpretation will be provided.  Please refer to "NOTES" tab for provider progress note.  Assessment  The primary encounter diagnosis was Chronic hip pain (Bilateral) (L>R). A diagnosis of Chronic anticoagulation (Plavix) was also pertinent to this visit.  Plan of Care  Problem-specific:  No problem-specific Assessment & Plan notes found for this encounter.  Destiny [name] has a current medication list which includes the following long-term medication(s): amlodipine, ipratropium-albuterol, levsin/sl, metformin, metoprolol succinate, mirtazapine, prochlorperazine, telmisartan, trazodone, and gabapentin.  Pharmacotherapy (Medications Ordered): No orders of the defined types were placed in this encounter.  Orders:  Orders Placed This Encounter  Procedures  . HIP INJECTION    Standing Status:   Future    Standing Expiration Date:   02/10/2020    Scheduling Instructions:     Side: Left-sided     Sedation: No Sedation.     Timeframe: As soon as schedule allows  . DG HIP UNILAT W OR W/O PELVIS 2-3 VIEWS RIGHT    Standing Status:   Future    Standing Expiration Date:   01/10/2021    Scheduling Instructions:     Please describe any evidence of DJD, such as joint narrowing, asymmetry, cysts, or any anomalies in bone density, production, or erosion.    Order Specific Question:   Reason for Exam (SYMPTOM  OR DIAGNOSIS REQUIRED)    Answer:   Right hip  pain/arthralgia    Order Specific Question:   Preferred imaging location?    Answer:   [facility]    Order Specific Question:   Call Results- Best Contact Number?    Answer:   [phone] (Pain Clinic facility) (Dr. Dossie Arbour)  . DG HIP UNILAT W OR W/O PELVIS 2-3 VIEWS LEFT    Standing Status:   Future    Standing Expiration Date:   01/10/2021    Scheduling Instructions:     Please describe any evidence of DJD, such as joint narrowing, asymmetry, cysts, or any anomalies in bone density, production, or erosion.    Order Specific Question:   Reason for Exam (SYMPTOM  OR DIAGNOSIS REQUIRED)    Answer:   Right hip pain/arthralgia    Order Specific Question:   Preferred imaging location?    Answer:   [facility]    Order Specific Question:   Call Results- Best Contact Number?    Answer:   [phone] (Pain Clinic facility) (Dr. Dossie Arbour)   Follow-up plan:   Return for Procedure (  no sedation): (L) Hip inj., (Blood-thinner Protocol).      Interventional treatment options: Planned, scheduled, and/or pending:   Diagnostic left intra-articular hip joint injection #1    Under consideration:   Diagnostic/therapeutic left L4 TESI #2  Diagnostic/therapeutic left L4-5 interlaminar LESI #2  Diagnostic left genicular NB #2    Therapeutic/palliative (PRN):   Therapeutic right L5-S1 LESI #2  Therapeutic right SI joint block #3  Therapeutic/bilateral right IA hip injection #2  Therapeutic right trochanteric bursa injection #2  Therapeutic right gluteus medius muscle MNB/TPI #2  Diagnostic right lumbar facet block #2  Therapeutic right T8-9 thoracic ESI #2     Recent Visits Date Type Provider Dept  12/30/19 Procedure visit Milinda Pointer, MD [facility]  12/23/19 Office Visit Milinda Pointer, MD [facility]  Showing recent visits within past 90 days and meeting all other requirements   Today's Visits Date Type Provider Dept  01/11/20 Telemedicine  Milinda Pointer, MD [facility]  Showing today's visits and meeting all other requirements   Future Appointments No visits were found meeting these conditions.  Showing future appointments within next 90 days and meeting all other requirements   I discussed the assessment and treatment plan with the patient. The patient was provided an opportunity to ask questions and all were answered. The patient agreed with the plan and demonstrated an understanding of the instructions.  Patient advised to call back or seek an in-person evaluation if the symptoms or condition worsens.  Duration of encounter: 40 minutes.  Note by: Gaspar Cola, MD Date: 01/11/2020; Time: 4:07 PM

## 2020-01-11 NOTE — Patient Instructions (Signed)
____________________________________________________________________________________________  Preparing for your procedure (without sedation)  Procedure appointments are limited to planned procedures: . No Prescription Refills. . No disability issues will be discussed. . No medication changes will be discussed.  Instructions: . Oral Intake: Do not eat or drink anything for at least 3 hours prior to your procedure. (Exception: Blood Pressure Medication. See below.) . Transportation: Unless otherwise stated by your physician, you may drive yourself after the procedure. . Blood Pressure Medicine: Do not forget to take your blood pressure medicine with a sip of water the morning of the procedure. If your Diastolic (lower reading)is above 100 mmHg, elective cases will be cancelled/rescheduled. . Blood thinners: These will need to be stopped for procedures. Notify our staff if you are taking any blood thinners. Depending on which one you take, there will be specific instructions on how and when to stop it. . Diabetics on insulin: Notify the staff so that you can be scheduled 1st case in the morning. If your diabetes requires high dose insulin, take only  of your normal insulin dose the morning of the procedure and notify the staff that you have done so. . Preventing infections: Shower with an antibacterial soap the morning of your procedure.  . Build-up your immune system: Take 1000 mg of Vitamin C with every meal (3 times a day) the day prior to your procedure. . Antibiotics: Inform the staff if you have a condition or reason that requires you to take antibiotics before dental procedures. . Pregnancy: If you are pregnant, call and cancel the procedure. . Sickness: If you have a cold, fever, or any active infections, call and cancel the procedure. . Arrival: You must be in the facility at least 30 minutes prior to your scheduled procedure. . Children: Do not bring any children with you. . Dress  appropriately: Bring dark clothing that you would not mind if they get stained. . Valuables: Do not bring any jewelry or valuables.  Reasons to call and reschedule or cancel your procedure: (Following these recommendations will minimize the risk of a serious complication.) . Surgeries: Avoid having procedures within 2 weeks of any surgery. (Avoid for 2 weeks before or after any surgery). . Flu Shots: Avoid having procedures within 2 weeks of a flu shots or . (Avoid for 2 weeks before or after immunizations). . Barium: Avoid having a procedure within 7-10 days after having had a radiological study involving the use of radiological contrast. (Myelograms, Barium swallow or enema study). . Heart attacks: Avoid any elective procedures or surgeries for the initial 6 months after a "Myocardial Infarction" (Heart Attack). . Blood thinners: It is imperative that you stop these medications before procedures. Let us know if you if you take any blood thinner.  . Infection: Avoid procedures during or within two weeks of an infection (including chest colds or gastrointestinal problems). Symptoms associated with infections include: Localized redness, fever, chills, night sweats or profuse sweating, burning sensation when voiding, cough, congestion, stuffiness, runny nose, sore throat, diarrhea, nausea, vomiting, cold or Flu symptoms, recent or current infections. It is specially important if the infection is over the area that we intend to treat. . Heart and lung problems: Symptoms that may suggest an active cardiopulmonary problem include: cough, chest pain, breathing difficulties or shortness of breath, dizziness, ankle swelling, uncontrolled high or unusually low blood pressure, and/or palpitations. If you are experiencing any of these symptoms, cancel your procedure and contact your primary care physician for an evaluation.  Remember:  Regular   Business hours are:  Monday to Thursday 8:00 AM to 4:00  PM  Provider's Schedule: Milanni Ayub, MD:  Procedure days: Tuesday and Thursday 7:30 AM to 4:00 PM  Bilal Lateef, MD:  Procedure days: Monday and Wednesday 7:30 AM to 4:00 PM ____________________________________________________________________________________________    

## 2020-01-12 ENCOUNTER — Observation Stay
Admission: EM | Admit: 2020-01-12 | Discharge: 2020-01-13 | Disposition: A | Discharge status: Home / Self Care | Attending: Internal Medicine | Admitting: Internal Medicine

## 2020-01-12 ENCOUNTER — Observation Stay

## 2020-01-12 ENCOUNTER — Telehealth: Payer: Self-pay | Admitting: *Deleted

## 2020-01-12 ENCOUNTER — Encounter: Payer: Self-pay | Admitting: Emergency Medicine

## 2020-01-12 ENCOUNTER — Emergency Department

## 2020-01-12 ENCOUNTER — Other Ambulatory Visit: Payer: Self-pay

## 2020-01-12 DIAGNOSIS — U071 COVID-19: Secondary | ICD-10-CM | POA: Diagnosis not present

## 2020-01-12 DIAGNOSIS — E785 Hyperlipidemia, unspecified: Secondary | ICD-10-CM | POA: Diagnosis not present

## 2020-01-12 DIAGNOSIS — Z933 Colostomy status: Secondary | ICD-10-CM | POA: Insufficient documentation

## 2020-01-12 DIAGNOSIS — G9389 Other specified disorders of brain: Secondary | ICD-10-CM | POA: Insufficient documentation

## 2020-01-12 DIAGNOSIS — Z7982 Long term (current) use of aspirin: Secondary | ICD-10-CM | POA: Insufficient documentation

## 2020-01-12 DIAGNOSIS — Z86010 Personal history of colonic polyps: Secondary | ICD-10-CM | POA: Diagnosis not present

## 2020-01-12 DIAGNOSIS — Z7901 Long term (current) use of anticoagulants: Secondary | ICD-10-CM | POA: Insufficient documentation

## 2020-01-12 DIAGNOSIS — Z7902 Long term (current) use of antithrombotics/antiplatelets: Secondary | ICD-10-CM | POA: Insufficient documentation

## 2020-01-12 DIAGNOSIS — R9389 Abnormal findings on diagnostic imaging of other specified body structures: Secondary | ICD-10-CM | POA: Diagnosis present

## 2020-01-12 DIAGNOSIS — C7800 Secondary malignant neoplasm of unspecified lung: Secondary | ICD-10-CM | POA: Insufficient documentation

## 2020-01-12 DIAGNOSIS — Q211 Atrial septal defect: Secondary | ICD-10-CM | POA: Insufficient documentation

## 2020-01-12 DIAGNOSIS — R4701 Aphasia: Principal | ICD-10-CM | POA: Insufficient documentation

## 2020-01-12 DIAGNOSIS — M48061 Spinal stenosis, lumbar region without neurogenic claudication: Secondary | ICD-10-CM | POA: Insufficient documentation

## 2020-01-12 DIAGNOSIS — Z823 Family history of stroke: Secondary | ICD-10-CM | POA: Insufficient documentation

## 2020-01-12 DIAGNOSIS — M8580 Other specified disorders of bone density and structure, unspecified site: Secondary | ICD-10-CM | POA: Diagnosis not present

## 2020-01-12 DIAGNOSIS — I6782 Cerebral ischemia: Secondary | ICD-10-CM | POA: Insufficient documentation

## 2020-01-12 DIAGNOSIS — M79603 Pain in arm, unspecified: Secondary | ICD-10-CM | POA: Diagnosis not present

## 2020-01-12 DIAGNOSIS — I10 Essential (primary) hypertension: Secondary | ICD-10-CM | POA: Insufficient documentation

## 2020-01-12 DIAGNOSIS — D72829 Elevated white blood cell count, unspecified: Secondary | ICD-10-CM | POA: Diagnosis not present

## 2020-01-12 DIAGNOSIS — Z79899 Other long term (current) drug therapy: Secondary | ICD-10-CM | POA: Insufficient documentation

## 2020-01-12 DIAGNOSIS — Z8673 Personal history of transient ischemic attack (TIA), and cerebral infarction without residual deficits: Secondary | ICD-10-CM | POA: Diagnosis not present

## 2020-01-12 DIAGNOSIS — Z9660 Presence of unspecified orthopedic joint implant: Secondary | ICD-10-CM | POA: Insufficient documentation

## 2020-01-12 DIAGNOSIS — G9340 Encephalopathy, unspecified: Secondary | ICD-10-CM | POA: Diagnosis present

## 2020-01-12 DIAGNOSIS — Z881 Allergy status to other antibiotic agents status: Secondary | ICD-10-CM | POA: Insufficient documentation

## 2020-01-12 DIAGNOSIS — E119 Type 2 diabetes mellitus without complications: Secondary | ICD-10-CM | POA: Insufficient documentation

## 2020-01-12 DIAGNOSIS — C20 Malignant neoplasm of rectum: Secondary | ICD-10-CM | POA: Diagnosis present

## 2020-01-12 DIAGNOSIS — F411 Generalized anxiety disorder: Secondary | ICD-10-CM | POA: Insufficient documentation

## 2020-01-12 DIAGNOSIS — Z880 Allergy status to penicillin: Secondary | ICD-10-CM | POA: Insufficient documentation

## 2020-01-12 DIAGNOSIS — M5414 Radiculopathy, thoracic region: Secondary | ICD-10-CM | POA: Diagnosis not present

## 2020-01-12 DIAGNOSIS — G8929 Other chronic pain: Secondary | ICD-10-CM | POA: Insufficient documentation

## 2020-01-12 DIAGNOSIS — R531 Weakness: Secondary | ICD-10-CM | POA: Diagnosis not present

## 2020-01-12 DIAGNOSIS — I639 Cerebral infarction, unspecified: Secondary | ICD-10-CM

## 2020-01-12 DIAGNOSIS — Z794 Long term (current) use of insulin: Secondary | ICD-10-CM | POA: Diagnosis not present

## 2020-01-12 DIAGNOSIS — Z882 Allergy status to sulfonamides status: Secondary | ICD-10-CM | POA: Insufficient documentation

## 2020-01-12 DIAGNOSIS — E1169 Type 2 diabetes mellitus with other specified complication: Secondary | ICD-10-CM | POA: Diagnosis present

## 2020-01-12 DIAGNOSIS — Z885 Allergy status to narcotic agent status: Secondary | ICD-10-CM | POA: Insufficient documentation

## 2020-01-12 DIAGNOSIS — Z888 Allergy status to other drugs, medicaments and biological substances status: Secondary | ICD-10-CM | POA: Insufficient documentation

## 2020-01-12 LAB — COMPREHENSIVE METABOLIC PANEL
ALT: 9 U/L (ref 0–44)
AST: 15 U/L (ref 15–41)
Albumin: 3.6 g/dL (ref 3.5–5.0)
Alkaline Phosphatase: 82 U/L (ref 38–126)
Anion gap: 10 (ref 5–15)
BUN: 27 mg/dL — ABNORMAL HIGH (ref 8–23)
CO2: 26 mmol/L (ref 22–32)
Calcium: 8.9 mg/dL (ref 8.9–10.3)
Chloride: 103 mmol/L (ref 98–111)
Creatinine, Ser: 0.94 mg/dL (ref 0.44–1.00)
GFR calc Af Amer: >60 mL/min (ref >60)
GFR calc non Af Amer: 52 mL/min — ABNORMAL LOW (ref >60)
Glucose, Bld: 130 mg/dL — ABNORMAL HIGH (ref 70–99)
Potassium: 4.7 mmol/L (ref 3.5–5.1)
Sodium: 139 mmol/L (ref 135–145)
Total Bilirubin: 0.4 mg/dL (ref 0.3–1.2)
Total Protein: 6.7 g/dL (ref 6.5–8.1)

## 2020-01-12 LAB — CBC
HCT: 39.8 % (ref 36.0–46.0)
Hemoglobin: 12.8 g/dL (ref 12.0–15.0)
MCH: 29.6 pg (ref 26.0–34.0)
MCHC: 32.2 g/dL (ref 30.0–36.0)
MCV: 91.9 fL (ref 80.0–100.0)
Platelets: 295 10*3/uL (ref 150–400)
RBC: 4.33 MIL/uL (ref 3.87–5.11)
RDW: 14.8 % (ref 11.5–15.5)
WBC: 13.5 10*3/uL — ABNORMAL HIGH (ref 4.0–10.5)
nRBC: 0 % (ref 0.0–0.2)

## 2020-01-12 LAB — DIFFERENTIAL
Abs Immature Granulocytes: 0.06 10*3/uL (ref 0.00–0.07)
Basophils Absolute: 0.1 10*3/uL (ref 0.0–0.1)
Basophils Relative: 1 %
Eosinophils Absolute: 0.3 10*3/uL (ref 0.0–0.5)
Eosinophils Relative: 2 %
Immature Granulocytes: 0 %
Lymphocytes Relative: 14 %
Lymphs Abs: 1.8 10*3/uL (ref 0.7–4.0)
Monocytes Absolute: 1.1 10*3/uL — ABNORMAL HIGH (ref 0.1–1.0)
Monocytes Relative: 8 %
Neutro Abs: 10.2 10*3/uL — ABNORMAL HIGH (ref 1.7–7.7)
Neutrophils Relative %: 75 %

## 2020-01-12 LAB — URINALYSIS, COMPLETE (UACMP) WITH MICROSCOPIC
Bilirubin Urine: NEGATIVE
Glucose, UA: NEGATIVE mg/dL
Ketones, ur: NEGATIVE mg/dL
Nitrite: NEGATIVE
Protein, ur: NEGATIVE mg/dL
Specific Gravity, Urine: 1.01 (ref 1.005–1.030)
Squamous Epithelial / HPF: NONE SEEN (ref 0–5)
pH: 5 (ref 5.0–8.0)

## 2020-01-12 LAB — PROTIME-INR
INR: 1 (ref 0.8–1.2)
Prothrombin Time: 12.8 seconds (ref 11.4–15.2)

## 2020-01-12 LAB — APTT: aPTT: 52 seconds — ABNORMAL HIGH (ref 24–36)

## 2020-01-12 LAB — GLUCOSE, CAPILLARY: Glucose-Capillary: 112 mg/dL — ABNORMAL HIGH (ref 70–99)

## 2020-01-12 MED ORDER — INSULIN ASPART 100 UNIT/ML ~~LOC~~ SOLN: 0.0000 [IU] | Freq: Three times a day (TID) | SUBCUTANEOUS | Status: DC

## 2020-01-12 MED ORDER — HYDROCODONE-ACETAMINOPHEN 5-325 MG PO TABS
1.0000 | ORAL_TABLET | ORAL | Status: DC | PRN
  Administered 2020-01-13: 1 via ORAL
  Filled 2020-01-12: qty 1

## 2020-01-12 MED ORDER — ENOXAPARIN SODIUM 40 MG/0.4ML ~~LOC~~ SOLN
30.0000 mg | SUBCUTANEOUS | Status: DC
Start: 2020-01-12 — End: 2020-01-12

## 2020-01-12 MED ORDER — LORAZEPAM 0.5 MG PO TABS
0.5000 mg | ORAL_TABLET | Freq: Two times a day (BID) | ORAL | Status: DC
Start: 2020-01-12 — End: 2020-01-12

## 2020-01-12 MED ORDER — SODIUM CHLORIDE 0.9% FLUSH
3.0000 mL | Freq: Once | INTRAVENOUS | Status: AC
  Administered 2020-01-12: 17:00:00 3 mL via INTRAVENOUS

## 2020-01-12 MED ORDER — STROKE: EARLY STAGES OF RECOVERY BOOK: Freq: Once | Status: AC

## 2020-01-12 MED ORDER — ASPIRIN 81 MG PO CHEW
81.0000 mg | CHEWABLE_TABLET | Freq: Every day | ORAL | Status: DC
  Administered 2020-01-13: 09:00:00 81 mg via ORAL
  Filled 2020-01-12: qty 1

## 2020-01-12 MED ORDER — ACETAMINOPHEN 650 MG RE SUPP: 650.0000 mg | RECTAL | Status: DC | PRN

## 2020-01-12 MED ORDER — ACETAMINOPHEN 160 MG/5ML PO SOLN
650.0000 mg | ORAL | Status: DC | PRN
  Filled 2020-01-12: qty 20.3

## 2020-01-12 MED ORDER — HEPARIN SODIUM (PORCINE) 5000 UNIT/ML IJ SOLN
5000.0000 [IU] | Freq: Three times a day (TID) | INTRAMUSCULAR | Status: DC
  Administered 2020-01-12 – 2020-01-13 (×2): 5000 [IU] via SUBCUTANEOUS
  Filled 2020-01-12 (×3): qty 1

## 2020-01-12 MED ORDER — SENNOSIDES-DOCUSATE SODIUM 8.6-50 MG PO TABS: 1.0000 | ORAL_TABLET | Freq: Every evening | ORAL | Status: DC | PRN

## 2020-01-12 MED ORDER — GUAIFENESIN-CODEINE 100-10 MG/5ML PO SOLN: 5.0000 mL | Freq: Four times a day (QID) | ORAL | Status: DC | PRN

## 2020-01-12 MED ORDER — MORPHINE SULFATE (CONCENTRATE) 10 MG/0.5ML PO SOLN: 10.0000 mg | ORAL | Status: DC | PRN

## 2020-01-12 MED ORDER — LORAZEPAM 0.5 MG PO TABS
0.5000 mg | ORAL_TABLET | ORAL | Status: DC | PRN
  Administered 2020-01-12 – 2020-01-13 (×2): 0.5 mg via ORAL
  Filled 2020-01-12 (×2): qty 1

## 2020-01-12 MED ORDER — IPRATROPIUM-ALBUTEROL 0.5-2.5 (3) MG/3ML IN SOLN: 3.0000 mL | RESPIRATORY_TRACT | Status: DC | PRN

## 2020-01-12 MED ORDER — SODIUM CHLORIDE 0.9 % IV SOLN: Freq: Once | INTRAVENOUS | Status: AC

## 2020-01-12 MED ORDER — GABAPENTIN 100 MG PO CAPS
100.0000 mg | ORAL_CAPSULE | Freq: Two times a day (BID) | ORAL | Status: DC
  Administered 2020-01-12 – 2020-01-13 (×2): 100 mg via ORAL
  Filled 2020-01-12 (×3): qty 1

## 2020-01-12 MED ORDER — HYDROCODONE-ACETAMINOPHEN 5-325 MG PO TABS: 1.0000 | ORAL_TABLET | Freq: Two times a day (BID) | ORAL | Status: DC | PRN

## 2020-01-12 MED ORDER — MIRTAZAPINE 15 MG PO TABS
15.0000 mg | ORAL_TABLET | Freq: Every day | ORAL | Status: DC
  Administered 2020-01-12: 22:00:00 15 mg via ORAL
  Filled 2020-01-12: qty 1

## 2020-01-12 MED ORDER — ACETAMINOPHEN 325 MG PO TABS: 650.0000 mg | ORAL_TABLET | ORAL | Status: DC | PRN

## 2020-01-12 NOTE — H&P (Signed)
History and Physical    Destiny [name] [id number] DOB: 15-Apr-1924 DOA: 01/12/2020  PCP: Dion Body, MD  Patient coming from: Home  I have personally briefly reviewed patient's old medical records in [place]  Chief Complaint: Aphasia  HPI: Destiny [name] is a 86 y.o. female with medical history significant for malignant rectal adenocarcinoma with pulmonary metastasis s/p APR with end sigmoid colostomy in place, history of CVA, type 2 diabetes, hypertension, hyperlipidemia who presents to the ED for evaluation of aphasia.  History is supplemented by daughter at bedside.  Patient currently lives at home with her daughter.  She has been having chronic low back pain with left hip pain lower extremity pain.  She is following with pain management and receiving epidural spinal injections for pain management.  She has been on aspirin and Plavix for secondary stroke prevention.  Her Plavix has been held for 7 days prior to her epidural spinal injections, last one was on 12/14/2019.  She is planned to have another injection on 01/18/2020 and her Plavix has been held the last 3 days in anticipation of this procedure.  Patient states on Monday (01/10/2020) she developed difficulty with speaking, primarily unable to say what she wanted to say.  She also had some new left upper extremity weakness which has resolved.  She has been having sensory changes in her left arm as well however this has been ongoing for the last year.  She otherwise denies any chest pain, dyspnea, cough, nausea, vomiting, headache, change in vision, abdominal pain, or dysuria.  ED Course:  Initial vitals showed BP 112/58, pulse 82, RR 18, temp 98.6 Fahrenheit, SPO2 90% on room air.  Labs show WBC 13.5, hemoglobin 12.8, platelets 295,000, sodium 139, potassium 4.7, bicarb 26, BUN 27, creatinine 0.94, LFTs within normal limits, serum glucose 130.  Urinalysis shows negative nitrites, large leukocytes, 0-5 RBC/hpf, 21-50  WBC/hpf, rare bacteria microscopy.  SARS-CoV-2 PCR is collected and pending.  Left hip x-ray shows mixed lytic and sclerotic process at the lateral aspect of the left inferior pubic ramus.  CT head without contrast shows a new hypodensity in the left parietal lobe, just inferior to the area of chronic infarction.  No acute hemorrhage noted.  Patient was given IV normal saline and the hospitalist service was consulted for further evaluation and management.  Review of Systems: All systems reviewed and are negative except as documented in history of present illness above.   Past Medical History:  Diagnosis Date  . Allergy   . Cancer ([place]) 03/08/2018  . Diabetes mellitus without complication ([place])   . GERD (gastroesophageal reflux disease)   . HTN (hypertension)   . Hyperlipidemia   . Shingles   . Stroke [facility])     Past Surgical History:  Procedure Laterality Date  . APPENDECTOMY    . BACK SURGERY    . COLONOSCOPY WITH PROPOFOL N/A 03/06/2018   Procedure: COLONOSCOPY WITH PROPOFOL;  Surgeon: Virgel Manifold, MD;  Location: [facility] ENDOSCOPY;  Service: Endoscopy;  Laterality: N/A;  . FLEXIBLE SIGMOIDOSCOPY Left 03/04/2018   Procedure: Colonoscopy/FLEXIBLE SIGMOIDOSCOPY;  Surgeon: Virgel Manifold, MD;  Location: [facility] ENDOSCOPY;  Service: Endoscopy;  Laterality: Left;  . FOOT SURGERY Bilateral    [place]  . JOINT REPLACEMENT    . KNEE SURGERY Bilateral   . TONSILECTOMY, ADENOIDECTOMY, BILATERAL MYRINGOTOMY AND TUBES      Social History:  reports that she has never smoked. She has never used smokeless tobacco. She reports that she  does not drink alcohol or use drugs.  Allergies  Allergen Reactions  . Penicillins     Has patient had a PCN reaction causing immediate rash, facial/tongue/throat swelling, SOB or lightheadedness with hypotension: Unknown Has patient had a PCN reaction causing severe rash involving mucus membranes or skin necrosis: Unknown Has patient had a PCN  reaction that required hospitalization: Unknown Has patient had a PCN reaction occurring within the last 10 years: No If all of the above answers are "NO", then may proceed with Cephalosporin use.   . Ampicillin Rash  . Amoxicillin Rash  . Citalopram Diarrhea  . Sulfamethoxazole-Trimethoprim Rash    Possible rash on stomach and back Possible rash on stomach and back   . Tramadol Itching    Family History  Problem Relation Age of Onset  . Stroke Mother   . Stroke Father      Prior to Admission medications   Medication Sig Start Date End Date Taking? Authorizing Provider  amLODipine (NORVASC) 2.5 MG tablet Take 1 tablet (2.5 mg total) by mouth daily. Take once daily if blood pressure > 160 Patient taking differently: Take 2.5 mg by mouth daily.  10/04/16 02/03/25 Yes Merlyn Lot, MD  aspirin 81 MG chewable tablet Chew 1 tablet (81 mg total) by mouth daily. 07/18/19  Yes Dustin Flock, MD  clopidogrel (PLAVIX) 75 MG tablet Take 75 mg by mouth daily.   Yes [provider]  gabapentin (NEURONTIN) 100 MG capsule Take 100 mg by mouth 2 (two) times daily.    Yes [provider]  guaiFENesin-codeine 100-10 MG/5ML syrup Take 5 mLs by mouth every 6 (six) hours as needed for cough.  12/27/19  Yes [provider]  hydrochlorothiazide (HYDRODIURIL) 12.5 MG tablet Take 12.5 mg by mouth daily as needed (swelling).    Yes [provider]  HYDROcodone-acetaminophen (NORCO/VICODIN) 5-325 MG tablet Take 1 tablet by mouth 2 (two) times daily as needed for moderate pain.  12/19/19  Yes [provider]  ipratropium-albuterol (DUONEB) 0.5-2.5 (3) MG/3ML SOLN Take 3 mLs by nebulization every 4 (four) hours as needed (shortness of breath).    Yes [provider]  LEVSIN/SL 0.125 MG SUBL Place 0.125 mg under the tongue every 4 (four) hours as needed (secretions).    Yes [provider]  LORazepam (ATIVAN) 0.5 MG tablet Take 0.5 mg by mouth in  the morning and at bedtime.    Yes [provider]  metFORMIN (GLUCOPHAGE) 500 MG tablet Take 500 mg by mouth daily.  07/01/16  Yes [provider]  metoprolol succinate (TOPROL-XL) 25 MG 24 hr tablet Take 25 mg by mouth daily.  10/26/19  Yes [provider]  mirtazapine (REMERON) 7.5 MG tablet Take 15 mg by mouth at bedtime. 06/23/19  Yes [provider]  Morphine Sulfate (MORPHINE CONCENTRATE) 10 mg / 0.5 ml concentrated solution Take 10 mg by mouth every 2 (two) hours as needed for severe pain or shortness of breath.  12/27/19  Yes [provider]  OXYTROL 3.9 MG/24HR Place 1 patch onto the skin every 3 (three) days.  12/09/19  Yes [provider]  telmisartan (MICARDIS) 80 MG tablet Take 80 mg by mouth at bedtime.    Yes [provider]  traZODone (DESYREL) 50 MG tablet Take 50 mg by mouth at bedtime as needed for sleep.  11/19/19  Yes [provider]    Physical Exam: Vitals:   01/12/20 1532 01/12/20 1533 01/12/20 1615 01/12/20 1727  BP: Marland Kitchen)  112/58  126/62   Pulse: 87  80 73  Resp: 18  (!) 24 19  Temp: 98.6 F (37 C)     SpO2: 90%  99% 100%  Weight:  56.7 kg    Height:  5' (1.524 m)     Constitutional: Elderly woman resting supine in bed, NAD, calm, comfortable Eyes: PERRL, EOMI, lids and conjunctivae normal ENMT: Mucous membranes are moist. Posterior pharynx clear of any exudate or lesions.Normal dentition.  Neck: normal, supple, no masses. Respiratory: Faint end expiratory wheezing bilaterally.  Normal respiratory effort. No accessory muscle use.  Cardiovascular: Regular rate and rhythm, no murmurs / rubs / gallops. No extremity edema. 2+ pedal pulses. Abdomen: no tenderness, no masses palpated. No hepatosplenomegaly. Bowel sounds positive.  Colostomy in place LLQ. Musculoskeletal: no clubbing / cyanosis. No joint deformity upper and lower extremities. Good ROM, no contractures. Normal muscle tone.  Skin: no rashes,  lesions, ulcers. No induration Neurologic: CN 2-12 grossly intact. Sensation intact, Strength somewhat limited in lower extremities due to hip pain/weakness otherwise appears intact in all extremities.  Speech clear and fluent. Psychiatric: Normal judgment and insight. Alert and oriented x 3. Normal mood.   Labs on Admission: I have personally reviewed following labs and imaging studies  CBC: Recent Labs  Lab 01/12/20 1547  WBC 13.5*  NEUTROABS 10.2*  HGB 12.8  HCT 39.8  MCV 91.9  PLT [id number]   Basic Metabolic Panel: Recent Labs  Lab 01/12/20 1547  NA 139  K 4.7  CL 103  CO2 26  GLUCOSE 130*  BUN 27*  CREATININE 0.94  CALCIUM 8.9   GFR: Estimated Creatinine Clearance: 28.9 mL/min (by C-G formula based on SCr of 0.94 mg/dL). Liver Function Tests: Recent Labs  Lab 01/12/20 1547  AST 15  ALT 9  ALKPHOS 82  BILITOT 0.4  PROT 6.7  ALBUMIN 3.6   No results for input(s): LIPASE, AMYLASE in the last 168 hours. No results for input(s): AMMONIA in the last 168 hours. Coagulation Profile: Recent Labs  Lab 01/12/20 1547  INR 1.0   Cardiac Enzymes: No results for input(s): CKTOTAL, CKMB, CKMBINDEX, TROPONINI in the last 168 hours. BNP (last 3 results) No results for input(s): PROBNP in the last 8760 hours. HbA1C: No results for input(s): HGBA1C in the last 72 hours. CBG: Recent Labs  Lab 01/12/20 1546  GLUCAP 112*   Lipid Profile: No results for input(s): CHOL, HDL, LDLCALC, TRIG, CHOLHDL, LDLDIRECT in the last 72 hours. Thyroid Function Tests: No results for input(s): TSH, T4TOTAL, FREET4, T3FREE, THYROIDAB in the last 72 hours. Anemia Panel: No results for input(s): VITAMINB12, FOLATE, FERRITIN, TIBC, IRON, RETICCTPCT in the last 72 hours. Urine analysis:    Component Value Date/Time   COLORURINE STRAW (A) 01/12/2020 1728   APPEARANCEUR CLEAR (A) 01/12/2020 1728   LABSPEC 1.010 01/12/2020 1728   PHURINE 5.0 01/12/2020 1728   GLUCOSEU NEGATIVE 01/12/2020  1728   HGBUR SMALL (A) 01/12/2020 1728   BILIRUBINUR NEGATIVE 01/12/2020 1728   KETONESUR NEGATIVE 01/12/2020 1728   PROTEINUR NEGATIVE 01/12/2020 1728   NITRITE NEGATIVE 01/12/2020 1728   LEUKOCYTESUR LARGE (A) 01/12/2020 1728    Radiological Exams on Admission: CT HEAD WO CONTRAST  Result Date: 01/12/2020 CLINICAL DATA:  Speech difficulty, stage IV lung cancer EXAM: CT HEAD WITHOUT CONTRAST TECHNIQUE: Contiguous axial images were obtained from the base of the skull through the vertex without intravenous contrast. COMPARISON:  07/16/2019 FINDINGS: Brain: Rounded hypodensity in the left parietal lobe reference image  17 measures 3.1 x 2.6 cm. This is just inferior to an area of chronic encephalomalacia and prior infarct. Differential diagnosis includes subacute infarct or metastatic disease in a patient with a known history of metastatic lung cancer. No signs of acute infarct or hemorrhage. The lateral ventricles and midline structures are stable, with chronic small vessel ischemic changes within the thalami and bilateral basal ganglia. No acute extra-axial fluid collections. No mass effect. Vascular: No hyperdense vessel or unexpected calcification. Skull: Normal. Negative for fracture or focal lesion. Sinuses/Orbits: No acute finding. Postsurgical changes are seen within the maxillary sinuses and ethmoid air cells. Other: None IMPRESSION: 1. New hypodensity in the left parietal lobe, just inferior to an area of chronic infarction. Differential diagnosis includes metastatic disease or acute/subacute infarct. MRI may be useful for further evaluation. 2. No acute hemorrhage. Electronically Signed   By: Randa Ngo M.D.   On: 01/12/2020 16:57   DG HIP UNILAT W OR W/O PELVIS 2-3 VIEWS LEFT  Result Date: 01/12/2020 CLINICAL DATA:  Chronic BILATERAL hip pain LEFT greater than RIGHT; history rectal cancer EXAM: DG HIP (WITH OR WITHOUT PELVIS) 2-3V LEFT COMPARISON:  None Correlation: CT abdomen and pelvis  03/04/2018, PET-CT 03/13/2018 FINDINGS: Osseous demineralization. Hip and SI joint spaces preserved. Mixed lytic and sclerotic expansile process identified at the LEFT inferior pubic ramus laterally new since 2019. Finding could represent a destructive bone lesion, healing fracture or pathologic fracture. No additional fracture, dislocation or bone destruction. Multilevel degenerative disc and facet disease changes of lumbar spine. IMPRESSION: Mixed lytic and sclerotic process at the lateral aspect of the LEFT inferior pubic ramus, with differential diagnosis including destructive bone lesion, healing fracture and pathologic fracture; further evaluation by MR recommended to exclude pathologic/destructive lesion. These results will be called to the ordering clinician or representative by the Radiologist Assistant, and communication documented in the PACS or [employer]. Electronically Signed   By: Lavonia Dana M.D.   On: 01/12/2020 08:36   DG HIP UNILAT W OR W/O PELVIS 2-3 VIEWS RIGHT  Result Date: 01/12/2020 CLINICAL DATA:  Right hip pain EXAM: DG HIP (WITH OR WITHOUT PELVIS) 2-3V RIGHT COMPARISON:  None. FINDINGS: Mild diffuse osteopenia. The no acute fracture or subluxation identified. Marked degenerative disc disease is identified within the visualized portions of the lower lumbar spine. Mild bilateral hip joint osteoarthritis. There is abnormal bone mineralization involving the left inferior pubic rami. There is cortical irregularity and increased lucency. IMPRESSION: 1. Abnormal appearance of the left inferior pubic rami with cortical irregularity and increased trabecular lucency. Cannot rule out osseous metastasis. 2. Mild bilateral hip osteoarthritis and lumbar degenerative disc disease. Electronically Signed   By: Kerby Moors M.D.   On: 01/12/2020 08:35    EKG: Independently reviewed. Sinus rhythm, low voltage.  Not significantly changed when compared to prior.  Assessment/Plan Principal  Problem:   Expressive aphasia Active Problems:   Malignant neoplasm of rectum (HCC)   History of CVA (cerebrovascular accident)   Type 2 diabetes mellitus with hyperlipidemia (HCC)  Destiny [name] is a 86 y.o. female with medical history significant for malignant rectal adenocarcinoma with pulmonary metastasis s/p APR with end sigmoid colostomy in place, history of CVA, type 2 diabetes, hypertension, hyperlipidemia who is admitted for evaluation of expressive aphasia.  Expressive aphasia with history of CVA: CT head with new hypodensity in the left parietal lobe concerning for acute/subacute infarct or metastatic disease. -Obtain MRI brain for further evaluation -Continue aspirin 81 mg daily, patient and  family prefer to hold Plavix for now pending MRI brain/further discussion as they are hoping to keep pain management appointment for epidural spinal injection on 01/18/2020 -Monitor on telemetry, continue neurochecks -Not currently on statin therapy -PT/OT/SLP eval  Metastatic rectal adenocarcinoma with pulmonary metastasis s/p and sigmoid colostomy: Follows with [facility] oncology, not currently on chemotherapy due to medication intolerance.  MRI brain pending to assess for metastatic lesion versus infarct as above.  Left hip x-ray shows left inferior pubic ramus changes suggestive of possible destructive bone lesion. -Continue colostomy care -Continue pain control  Type 2 diabetes: Continue sensitive SSI, hold Metformin on hold.  Hypertension: Normotensive on admission.  Holding home Toprol-XL, telmisartan, amlodipine, HCTZ for now.  Chronic pain: Continue home Norco and as needed morphine.  Anxiety: Continue Ativan twice daily.  Goals of care: Had a long discussion with patient's daughter at bedside.  Patient is currently active with hospice and it is clear that the patient's and family's primary focus is on her quality of life and pain control.  Patient is DNR.  We will continue pain  control as above and recommend continued hospice care on discharge.  DVT prophylaxis: Lovenox Code Status: DNR, confirmed with patient Family Communication: Discussed at length with patient's daughter at bedside Disposition Plan: Likely discharge to home pending stroke evaluation. Consults called: None Admission status: Observation   Zada Finders MD [facility]  If 7PM-7AM, please contact night-coverage [email or web address]  01/12/2020, 7:34 PM

## 2020-01-12 NOTE — ED Notes (Signed)
Pt given turkey sandwich tray 

## 2020-01-12 NOTE — ED Notes (Signed)
Pt transported to MRI 

## 2020-01-12 NOTE — ED Notes (Signed)
Report from [place], [place]  PT remains in MRI

## 2020-01-12 NOTE — ED Provider Notes (Addendum)
[facility] Emergency Department Provider Note  ____________________________________________   First MD Initiated Contact with Patient 01/12/20 1605     (approximate)  I have reviewed the triage vital signs and the nursing notes.   HISTORY  Chief Complaint Cerebrovascular Accident    HPI Destiny [name] is a 86 y.o. female  Here with difficulty speaking, confusion for the past 3 days. Sx started 3 days ago, when she awoke and felt like she was having a hard time getting the correct words out. She felt mildly more weak on her left arm as well. Since then, she has felt fatigued, slightly more confused. On my assessment, history is somewhat limited due to word-finding difficulty. Pt admits that she feels "weak" and "not right." She has been having more difficulty getting around. Denies any headache, vision changes. No focal numbness or weakness. No other complaints.        Past Medical History:  Diagnosis Date  . Allergy   . Cancer ([place]) 03/08/2018  . Diabetes mellitus without complication ([place])   . GERD (gastroesophageal reflux disease)   . HTN (hypertension)   . Hyperlipidemia   . Shingles   . Stroke [facility])     Patient Active Problem List   Diagnosis Date Noted  . Expressive aphasia 01/12/2020  . Chronic knee pain after total replacement (Left) 12/23/2019  . Chronic knee pain (Left) 12/23/2019  . Lumbar foraminal stenosis (L4-5) (Left) 12/23/2019  . Lumbar radiculopathy (L4) (Left) 12/23/2019  . Chronic anticoagulation (Plavix) 12/23/2019  . History of knee replacement, total, left 10/13/2019  . Benign neoplasm of transverse colon 08/19/2019  . Nonrheumatic aortic valve insufficiency 08/19/2019  . PFO (patent foramen ovale) 08/19/2019  . Cerebrovascular accident (CVA) due to embolism of left middle cerebral artery ([place]) 08/10/2019  . History of CVA (cerebrovascular accident) 07/20/2019  . Stroke ([place]) 07/17/2019  . Stroke (cerebrum) ([place])  07/17/2019  . Metastasis to lung ([place]) 09/30/2018  . Colon cancer metastasized to lung ([place]) 08/18/2018  . GAD (generalized anxiety disorder) 08/18/2018  . DDD (degenerative disc disease), thoracic 04/09/2018  . Malignant neoplasm of rectum ([place]) 04/08/2018  . Other specified dorsopathies, sacral and sacrococcygeal region 04/08/2018  . Pharmacologic therapy 04/08/2018  . Disorder of skeletal system 04/08/2018  . Problems influencing health status 04/08/2018  . Thoracic radiculitis (Right) 04/08/2018  . Acute low back pain without sciatica 04/08/2018  . Acute upper back pain (Right) 04/08/2018  . Goals of care, counseling/discussion 03/13/2018  . Polyp of sigmoid colon   . Intestinal lump   . Diverticulosis of large intestine without diverticulitis   . Internal hemorrhoids   . Nodule of rectum   . Intestinal neoplasm   . Hematochezia   . GI bleed 03/03/2018  . Lower GI bleed 03/02/2018  . Spondylosis without myelopathy or radiculopathy, lumbosacral region 01/29/2018  . Trigger point with back pain (buttocks area) (Right) 11/18/2017  . Groin pain (Right) 11/18/2017  . Trochanteric bursitis of hip (Right) 10/20/2017  . Lumbar facet syndrome (Bilateral) 09/18/2017  . Osteoarthritis of sacroiliac joints (Bilateral) 09/17/2017  . Osteoarthritis of hip (Bilateral) 09/17/2017  . Osteoarthritis of lumbar spine 09/17/2017  . Lumbar spondylosis 09/17/2017  . Long term current use of anticoagulant (Plavix) 09/17/2017  . DDD (degenerative disc disease), lumbar 09/17/2017  . Lumbar facet hypertrophy 09/17/2017  . Chronic low back pain (Primary Area of Pain) (Bilateral) (L>R) 09/08/2017  . Chronic sacroiliac joint pain (Bilateral) (L>R) 09/08/2017  . Chronic hip pain (Bilateral) (L>R)  09/08/2017  . Chronic pain syndrome 09/08/2017  . Diabetes mellitus type 2, uncomplicated ([place]) 88/28/0034  . CVA (cerebral vascular accident) ([place]) 07/30/2017  . Chronic myofascial pain 08/01/2016  .  Esophageal reflux 03/01/2014  . Benign essential hypertension 03/01/2014  . Migraine 03/01/2014  . HLD (hyperlipidemia) 03/01/2014  . Type II or unspecified type diabetes mellitus without mention of complication, not stated as uncontrolled 03/01/2014  . Type 2 diabetes mellitus with hyperlipidemia ([address]) 03/01/2014  . Pseudophakia, both eyes 10/30/2012  . 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 APPENDECTOMY    . BACK SURGERY    . COLONOSCOPY WITH PROPOFOL N/A 03/06/2018   Procedure: COLONOSCOPY WITH PROPOFOL;  Surgeon: Virgel Manifold, MD;  Location: [facility] ENDOSCOPY;  Service: Endoscopy;  Laterality: N/A;  . FLEXIBLE SIGMOIDOSCOPY Left 03/04/2018   Procedure: Colonoscopy/FLEXIBLE SIGMOIDOSCOPY;  Surgeon: Virgel Manifold, MD;  Location: [facility] ENDOSCOPY;  Service: Endoscopy;  Laterality: Left;  . FOOT SURGERY Bilateral    [place]  . JOINT REPLACEMENT    . KNEE SURGERY Bilateral   . TONSILECTOMY, ADENOIDECTOMY, BILATERAL MYRINGOTOMY AND TUBES      Prior to Admission medications   Medication Sig Start Date End Date Taking? Authorizing Provider  amLODipine (NORVASC) 2.5 MG tablet Take 1 tablet (2.5 mg total) by mouth daily. Take once daily if blood pressure > 160 Patient taking differently: Take 2.5 mg by mouth daily.  10/04/16 02/03/25 Yes Merlyn Lot, MD  aspirin 81 MG chewable tablet Chew 1 tablet (81 mg total) by mouth daily. 07/18/19  Yes Dustin Flock, MD  clopidogrel (PLAVIX) 75 MG tablet Take 75 mg by mouth daily.   Yes [provider]  gabapentin (NEURONTIN) 100 MG capsule Take 100 mg by mouth 2 (two) times daily.    Yes [provider]  guaiFENesin-codeine 100-10 MG/5ML syrup Take 5 mLs by mouth every 6 (six) hours as needed for cough.  12/27/19  Yes [provider]  hydrochlorothiazide (HYDRODIURIL) 12.5 MG tablet Take 12.5 mg by mouth daily as needed (swelling).    Yes [provider]  HYDROcodone-acetaminophen (NORCO/VICODIN) 5-325 MG tablet Take 1 tablet by mouth 2 (two) times daily as needed for moderate pain.  12/19/19  Yes [provider]  ipratropium-albuterol (DUONEB) 0.5-2.5 (3) MG/3ML SOLN Take 3 mLs by nebulization every 4 (four) hours as needed (shortness of breath).    Yes [provider]  LEVSIN/SL 0.125 MG SUBL Place 0.125 mg under the tongue every 4 (four) hours as needed (secretions).    Yes [provider]  LORazepam (ATIVAN) 0.5 MG tablet Take 0.5 mg by mouth in the morning and at bedtime.    Yes [provider]  metFORMIN (GLUCOPHAGE) 500 MG tablet Take 500 mg by mouth daily.  07/01/16  Yes [provider]  metoprolol succinate (TOPROL-XL) 25 MG 24 hr tablet Take 25 mg by mouth daily.  10/26/19  Yes [provider]  mirtazapine (REMERON) 7.5 MG tablet Take 15 mg by mouth at bedtime. 06/23/19  Yes [provider]  Morphine Sulfate (MORPHINE CONCENTRATE) 10 mg / 0.5 ml concentrated solution Take 10 mg by mouth every 2 (two) hours as needed for severe pain or shortness of breath.  12/27/19  Yes [provider]  OXYTROL 3.9 MG/24HR Place 1 patch onto the skin every 3 (three) days.  12/09/19  Yes [provider]  telmisartan (MICARDIS) 80 MG tablet Take 80 mg by mouth at bedtime.  Yes [provider]  traZODone (DESYREL) 50 MG tablet Take 50 mg by mouth at bedtime as needed for sleep.  11/19/19  Yes [provider]    Allergies Penicillins, Ampicillin, Amoxicillin, Citalopram, Sulfamethoxazole-trimethoprim, and Tramadol  Family History  Problem Relation Age of Onset  . Stroke Mother   . Stroke Father     Social History Social History   Tobacco Use  . Smoking status: Never Smoker  . Smokeless tobacco: Never Used  Substance Use Topics  . Alcohol use: No  . Drug use: No    Review of Systems  Review of Systems  Constitutional: Positive for  fatigue. Negative for fever.  HENT: Negative for congestion and sore throat.   Eyes: Negative for visual disturbance.  Respiratory: Negative for cough and shortness of breath.   Cardiovascular: Negative for chest pain.  Gastrointestinal: Negative for abdominal pain, diarrhea, nausea and vomiting.  Genitourinary: Negative for flank pain.  Musculoskeletal: Negative for back pain and neck pain.  Skin: Negative for rash and wound.  Neurological: Positive for speech difficulty and light-headedness. Negative for weakness.     ____________________________________________  PHYSICAL EXAM:      VITAL SIGNS: ED Triage Vitals  Enc Vitals Group     BP 01/12/20 1532 (!) 112/58     Pulse Rate 01/12/20 1532 87     Resp 01/12/20 1532 18     Temp 01/12/20 1532 98.6 F (37 C)     Temp src --      SpO2 01/12/20 1532 90 %     Weight 01/12/20 1533 125 lb (56.7 kg)     Height 01/12/20 1533 5' (1.524 m)     Head Circumference --      Peak Flow --      Pain Score 01/12/20 1533 0     Pain Loc --      Pain Edu? --      Excl. in [place]? --      Physical Exam Vitals and nursing note reviewed.  Constitutional:      General: She is not in acute distress.    Appearance: She is well-developed.  HENT:     Head: Normocephalic and atraumatic.  Eyes:     Conjunctiva/sclera: Conjunctivae normal.  Cardiovascular:     Rate and Rhythm: Normal rate and regular rhythm.     Heart sounds: Normal heart sounds. No murmur. No friction rub.  Pulmonary:     Effort: Pulmonary effort is normal. No respiratory distress.     Breath sounds: Normal breath sounds. No wheezing or rales.  Abdominal:     General: There is no distension.     Palpations: Abdomen is soft.     Tenderness: There is no abdominal tenderness.  Musculoskeletal:     Cervical back: Neck supple.  Skin:    General: Skin is warm.     Capillary Refill: Capillary refill takes less than 2 seconds.  Neurological:     Mental Status: She is alert. She is  disoriented.     Motor: No abnormal muscle tone.     Comments: Slight expressive aphasia, but CN otherwise intact. MAE with 5/5 strength. Slight confusion, disoriented to time.        ____________________________________________   LABS (all labs ordered are listed, but only abnormal results are displayed)  Labs Reviewed  APTT - Abnormal; Notable for the following components:      Result Value   aPTT 52 (*)    All other components within  normal limits  CBC - Abnormal; Notable for the following components:   WBC 13.5 (*)    All other components within normal limits  DIFFERENTIAL - Abnormal; Notable for the following components:   Neutro Abs 10.2 (*)    Monocytes Absolute 1.1 (*)    All other components within normal limits  COMPREHENSIVE METABOLIC PANEL - Abnormal; Notable for the following components:   Glucose, Bld 130 (*)    BUN 27 (*)    GFR calc non Af Amer 52 (*)    All other components within normal limits  GLUCOSE, CAPILLARY - Abnormal; Notable for the following components:   Glucose-Capillary 112 (*)    All other components within normal limits  URINALYSIS, COMPLETE (UACMP) WITH MICROSCOPIC - Abnormal; Notable for the following components:   Color, Urine STRAW (*)    APPearance CLEAR (*)    Hgb urine dipstick SMALL (*)    Leukocytes,Ua LARGE (*)    Bacteria, UA RARE (*)    All other components within normal limits  SARS CORONAVIRUS 2 (TAT 6-24 HRS)  PROTIME-INR  HEMOGLOBIN A1C  LIPID PANEL  I-STAT CREATININE, ED    ____________________________________________  EKG: Normal sinus rhythm, VR 72. QRS 97, QTc 416. No acute st elevation or depressions. ________________________________________  RADIOLOGY All imaging, including plain films, CT scans, and ultrasounds, independently reviewed by me, and interpretations confirmed via formal radiology reads.  ED MD interpretation:   CT Head: New area of infarct in left parietal lobe  Official radiology  report(s): CT HEAD WO CONTRAST  Result Date: 01/12/2020 CLINICAL DATA:  Speech difficulty, stage IV lung cancer EXAM: CT HEAD WITHOUT CONTRAST TECHNIQUE: Contiguous axial images were obtained from the base of the skull through the vertex without intravenous contrast. COMPARISON:  07/16/2019 FINDINGS: Brain: Rounded hypodensity in the left parietal lobe reference image 17 measures 3.1 x 2.6 cm. This is just inferior to an area of chronic encephalomalacia and prior infarct. Differential diagnosis includes subacute infarct or metastatic disease in a patient with a known history of metastatic lung cancer. No signs of acute infarct or hemorrhage. The lateral ventricles and midline structures are stable, with chronic small vessel ischemic changes within the thalami and bilateral basal ganglia. No acute extra-axial fluid collections. No mass effect. Vascular: No hyperdense vessel or unexpected calcification. Skull: Normal. Negative for fracture or focal lesion. Sinuses/Orbits: No acute finding. Postsurgical changes are seen within the maxillary sinuses and ethmoid air cells. Other: None IMPRESSION: 1. New hypodensity in the left parietal lobe, just inferior to an area of chronic infarction. Differential diagnosis includes metastatic disease or acute/subacute infarct. MRI may be useful for further evaluation. 2. No acute hemorrhage. Electronically Signed   By: Randa Ngo M.D.   On: 01/12/2020 16:57    ____________________________________________  PROCEDURES   Procedure(s) performed (including Critical Care):  Procedures  ____________________________________________  INITIAL IMPRESSION / MDM / [place] / ED COURSE  As part of my medical decision making, I reviewed the following data within the [place] notes reviewed and incorporated, Old chart reviewed, Notes from prior ED visits, and [place] Controlled Substance Database       *Destiny [name] was evaluated in  Emergency Department on 01/12/2020 for the symptoms described in the history of present illness. She was evaluated in the context of the global COVID-19 pandemic, which necessitated consideration that the patient might be at risk for infection with the SARS-CoV-2 virus that causes COVID-19. Institutional protocols and algorithms that  pertain to the evaluation of patients at risk for COVID-19 are in a state of rapid change based on information released by regulatory bodies including the CDC and federal and state organizations. These policies and algorithms were followed during the patient's care in the ED.  Some ED evaluations and interventions may be delayed as a result of limited staffing during the pandemic.*     Medical Decision Making:  86 yo F here with new expressive aphasia and confusion. CT shows new parietal infarct vs mass, and pt does have known h/o metastatic CA. Pt also has +UTI on UA, so there could be a concomitant component of metabolic encephalopathy though more focal aphasia suggest CNS etiology. Per discussion with daughter, pt has known metastatic CA but she is hesitant to break the news of a possible met to her. Will admit for further work-up and management. No seizure like activity here.  ____________________________________________  FINAL CLINICAL IMPRESSION(S) / ED DIAGNOSES  Final diagnoses:  Expressive aphasia  Encephalopathy  Abnormal CT scan     MEDICATIONS GIVEN DURING THIS VISIT:  Medications   stroke: mapping our early stages of recovery book (has no administration in time range)  acetaminophen (TYLENOL) tablet 650 mg (has no administration in time range)    Or  acetaminophen (TYLENOL) 160 MG/5ML solution 650 mg (has no administration in time range)    Or  acetaminophen (TYLENOL) suppository 650 mg (has no administration in time range)  senna-docusate (Senokot-S) tablet 1 tablet (has no administration in time range)  enoxaparin (LOVENOX) injection 30 mg (has  no administration in time range)  aspirin chewable tablet 81 mg (has no administration in time range)  gabapentin (NEURONTIN) capsule 100 mg (has no administration in time range)  guaiFENesin-codeine 100-10 MG/5ML solution 5 mL (has no administration in time range)  HYDROcodone-acetaminophen (NORCO/VICODIN) 5-325 MG per tablet 1 tablet (has no administration in time range)  ipratropium-albuterol (DUONEB) 0.5-2.5 (3) MG/3ML nebulizer solution 3 mL (has no administration in time range)  LORazepam (ATIVAN) tablet 0.5 mg (has no administration in time range)  mirtazapine (REMERON) tablet 15 mg (has no administration in time range)  morphine CONCENTRATE 10 mg / 0.5 ml oral solution 10 mg (has no administration in time range)  insulin aspart (novoLOG) injection 0-9 Units (has no administration in time range)  sodium chloride flush (NS) 0.9 % injection 3 mL (3 mLs Intravenous Given 01/12/20 1640)  0.9 %  sodium chloride infusion ( Intravenous New Bag/Given 01/12/20 1924)     ED Discharge Orders    None       Note:  This document was prepared using Dragon voice recognition software and may include unintentional dictation errors.   Duffy Bruce, MD 01/12/20 2124    Duffy Bruce, MD 01/24/20 1423

## 2020-01-12 NOTE — ED Notes (Signed)
Pt daughter gave pt lorazepam, and hydrocodone around 1800 while in ED without consulting RN or MD prior to administration.

## 2020-01-12 NOTE — ED Notes (Signed)
Pt assisted to toilet to provide urine sample. Pt able to stand and take a few small steps to pivot onto toilet from stretcher.

## 2020-01-12 NOTE — ED Notes (Signed)
Pt cleared by MD to have PO fluids. Pt given ginger ale at this time

## 2020-01-12 NOTE — ED Triage Notes (Signed)
Pt to ER with daughter with c/o having trouble getting out her words since Monday. Pt has hx of strokes in past.  Pt states stage IV lung CA, wears O2 frequently.

## 2020-01-13 ENCOUNTER — Other Ambulatory Visit: Payer: Self-pay

## 2020-01-13 ENCOUNTER — Observation Stay

## 2020-01-13 ENCOUNTER — Telehealth: Payer: Self-pay | Admitting: Pain Medicine

## 2020-01-13 ENCOUNTER — Encounter: Payer: Self-pay | Admitting: Internal Medicine

## 2020-01-13 ENCOUNTER — Ambulatory Visit: Payer: Medicare PPO | Admitting: Pain Medicine

## 2020-01-13 ENCOUNTER — Observation Stay (HOSPITAL_BASED_OUTPATIENT_CLINIC_OR_DEPARTMENT_OTHER)
Admit: 2020-01-13 | Discharge: 2020-01-13 | Disposition: A | Discharge status: Home / Self Care | Attending: Neurology | Admitting: Neurology

## 2020-01-13 DIAGNOSIS — I351 Nonrheumatic aortic (valve) insufficiency: Secondary | ICD-10-CM

## 2020-01-13 DIAGNOSIS — I639 Cerebral infarction, unspecified: Secondary | ICD-10-CM

## 2020-01-13 DIAGNOSIS — I340 Nonrheumatic mitral (valve) insufficiency: Secondary | ICD-10-CM | POA: Diagnosis not present

## 2020-01-13 DIAGNOSIS — R4701 Aphasia: Secondary | ICD-10-CM

## 2020-01-13 LAB — GLUCOSE, CAPILLARY
Glucose-Capillary: 122 mg/dL — ABNORMAL HIGH (ref 70–99)
Glucose-Capillary: 106 mg/dL — ABNORMAL HIGH (ref 70–99)

## 2020-01-13 LAB — LIPID PANEL
Cholesterol: 186 mg/dL (ref 0–200)
HDL: 48 mg/dL (ref >40)
LDL Cholesterol: 125 mg/dL — ABNORMAL HIGH (ref 0–99)
Total CHOL/HDL Ratio: 3.9 RATIO
Triglycerides: 63 mg/dL (ref <150)
VLDL: 13 mg/dL (ref 0–40)

## 2020-01-13 LAB — ECHOCARDIOGRAM COMPLETE
Height: 60 in
Weight: 2000 oz

## 2020-01-13 LAB — SARS CORONAVIRUS 2 (TAT 6-24 HRS): SARS Coronavirus 2: POSITIVE — AB

## 2020-01-13 LAB — HEMOGLOBIN A1C
Hgb A1c MFr Bld: 7.4 % — ABNORMAL HIGH (ref 4.8–5.6)
Mean Plasma Glucose: 165.68 mg/dL

## 2020-01-13 MED ORDER — ZINC SULFATE 220 (50 ZN) MG PO CAPS
220.0000 mg | ORAL_CAPSULE | Freq: Every day | ORAL | Status: DC
  Filled 2020-01-13: qty 1

## 2020-01-13 MED ORDER — ASCORBIC ACID 500 MG PO TABS
500.0000 mg | ORAL_TABLET | Freq: Every day | ORAL | Status: DC
  Filled 2020-01-13: qty 1

## 2020-01-13 NOTE — Evaluation (Signed)
Occupational Therapy Evaluation Patient Details Name: Destiny [name] MRN: [id number] DOB: 1923/10/17 Today's Date: 01/13/2020    History of Present Illness TARESA SHERBERT is a 86 y.o. female with medical history significant for malignant rectal adenocarcinoma with pulmonary metastasis s/p APR with end sigmoid colostomy in place, history of CVA, type 2 diabetes, hypertension, hyperlipidemia who presents to the ED for evaluation of aphasia.  Acute left PCA territory infarct, predominantly in the lateral lefttemporal lobe. No hemorrhage or mass effect.   Clinical Impression   Patient seen this AM for OT evaluation.  Daughter present for duration of evaluation.  Patient reports her L knee is feeling sore while supine and increased pain with movement.  No grimacing or pain behaviors noted during functional tasks.  HR ranged from 89-150 with activity.  02 remained above 90% on RA during all activities.  Patient reports prior to hospitalization she was MOD I with ADLs and functional mobility.  Patient states she occasionally asked for assistance with dressing tasks from daughter when pain is severe.  Patient also has reacher she utilizes.  Patient able to move to EOB with SBA and extra time.  Requires cues for safety d/t telemetry leads.  Performs sit<>stand using RW with CGA/SBA.  Performed functional mobility to restroom using RW with CGA/SBA.  Performed toileting with SBA.  Donned/doffed underwear and socks while sitting and standing with SBA.  Patient states she feels as though she is near her baseline and feels confident.  Daughter states she is almost always at home to aid.  Daughter references need for hospital bed and will follow up with hospice.  Unable to determine if patient has hospice at home or not.  Discussed benefits of home health occupational therapy but patient's daughter is not interested at this time.  Stated patient could benefit from OT while at hospital to address activity tolerance,  compensatory techniques and ADL retraining.  During session, results arrived and patient is COVID-19 positive.      Follow Up Recommendations  No OT follow up    Equipment Recommendations  (Patient's daughter is interested in hospital bed at home.)    Recommendations for Other Services       Precautions / Restrictions Precautions Precautions: Fall;Other (comment) Precaution Comments: Airbone/contact precautions - COVID 19 Restrictions Weight Bearing Restrictions: No      Mobility Bed Mobility Overal bed mobility: Needs Assistance Bed Mobility: Supine to Sit;Sit to Supine     Supine to sit: Min guard;HOB elevated Sit to supine: Min assist(for L LE)      Transfers Overall transfer level: Needs assistance Equipment used: Rolling walker (2 wheeled) Transfers: Sit to/from [facility] Sit to Stand: Supervision;Min guard Stand pivot transfers: Supervision;Min guard       General transfer comment: CGA-SBA for functional transfers.  cues needed to safety with management of telemetry leads    Balance                                           ADL either performed or assessed with clinical judgement   ADL Overall ADL's : Needs assistance/impaired     Grooming: Wash/dry hands;Standing;Supervision/safety;Min guard                   Toilet Transfer: Human resources officer and Hygiene: Min guard;Supervision/safety;Sit to/from stand Toileting - Water quality scientist Details (indicate cue  type and reason): Able to doff soiled underwear while seated with SPV     Functional mobility during ADLs: Min guard;Supervision/safety;Rolling walker General ADL Comments: Patient demonstrates good safety and self pacing during functional transfers, mobility and self care tasks.     Vision Baseline Vision/History: Wears glasses Patient Visual Report: No change from baseline       Perception      Praxis      Pertinent Vitals/Pain Pain Assessment: No/denies pain     Hand Dominance Right   Extremity/Trunk Assessment Upper Extremity Assessment Upper Extremity Assessment: Overall WFL for tasks assessed   Lower Extremity Assessment Lower Extremity Assessment: Defer to PT evaluation       Communication Communication Communication: No difficulties(Patient states she feels as though her aphasia has "cleared up".)   Cognition Arousal/Alertness: Awake/alert Behavior During Therapy: WFL for tasks assessed/performed Overall Cognitive Status: Within Functional Limits for tasks assessed                                 General Comments: A&Ox4. Pleasant and cooperative   General Comments  Patient states as though she is feeling better but is still becoming fatigued easily.    Exercises Other Exercises Other Exercises: Provided education on goals and role of OT in acute care setting Other Exercises: Provided general safety education regarding bed controls, call light, telemetry leads, etc Other Exercises: Patient completed toileting with CGA-SBA using RW.  Cues needed for safety regarding telemetry leads.  Completed LB dressing during sit<>stand with SBA and cues to use RW Other Exercises: Provided education on use of body mechanics, sequencing and self pacing to improve safety during bed mobility and functional transfers   Shoulder Instructions      Home Living Family/patient expects to be discharged to:: Private residence Living Arrangements: Children(daughter) Available Help at Discharge: Family;Available PRN/intermittently(Patient may be alone 1-2 hours occasionally.)   Home Access: Level entry     Home Layout: One level     Bathroom Shower/Tub: Walk-in shower(built in seat)         Home Equipment: Shower seat - built in;Walker - 4 wheels;Cane - single point;Bedside commode          Prior Functioning/Environment Level of Independence: Needs  assistance;Independent with assistive device(s)  Gait / Transfers Assistance Needed: Patient reports using rollator for functional mobility. ADL's / Homemaking Assistance Needed: Patient able to complete BADLs at MOD I prior, but due to recent back/hip/knee pain, has occasionally required assistance for LB dressing. Communication / Swallowing Assistance Needed: n/a Comments: No fall reported.        OT Problem List: Decreased strength;Decreased activity tolerance      OT Treatment/Interventions: Self-care/ADL training;Therapeutic exercise;Energy conservation;DME and/or AE instruction;Therapeutic activities;Patient/family education    OT Goals(Current goals can be found in the care plan section) Acute Rehab OT Goals Patient Stated Goal: "Get back home" OT Goal Formulation: With patient Time For Goal Achievement: 01/27/20 Potential to Achieve Goals: Good  OT Frequency: Min 1X/week   Barriers to D/C:            Co-evaluation              AM-PAC OT "6 Clicks" Daily Activity     Outcome Measure Help from another person eating meals?: None Help from another person taking care of personal grooming?: None Help from another person toileting, which includes using toliet, bedpan, or urinal?: A Little Help from another  person bathing (including washing, rinsing, drying)?: A Little Help from another person to put on and taking off regular upper body clothing?: None Help from another person to put on and taking off regular lower body clothing?: None 6 Click Score: 22   End of Session Equipment Utilized During Treatment: Gait belt;Rolling walker Nurse Communication: Precautions(Patient found COVID 19 positive)  Activity Tolerance: Patient tolerated treatment well Patient left: in bed;with call bell/phone within reach;with bed alarm set;with family/visitor present                   Time: [id number] OT Time Calculation (min): 61 min Charges:  OT General Charges $OT Visit: 1  Visit OT Evaluation $OT Eval Low Complexity: 1 Low OT Treatments $Self Care/Home Management : 38-52 mins $Therapeutic Activity: 8-22 mins  Baldomero Lamy, MS, OTR/L 01/13/20, 3:55 PM

## 2020-01-13 NOTE — Discharge Summary (Signed)
Physician Discharge Summary  Destiny [name] [id number] DOB: [birth year] DOA: 01/12/2020  PCP: Destiny Body, MD  Admit date: 01/12/2020 Discharge date: 01/13/2020  Admitted From: home Disposition: to resume home w/ hospice as per pt & pt's daughter request   Recommendations for Outpatient Follow-up:  1. Follow up with hospice physician ASAP   Home Health: home hospice  Equipment/Devices: chronic 2L [name]  Discharge Condition: hospice  CODE STATUS: DNR  Diet recommendation: Heart Healthy / Carb Modified   Brief/Interim Summary: HPI was taken from Dr. Zada [name]: Destiny [name] is a 86 y.o. female with medical history significant for malignant rectal adenocarcinoma with pulmonary metastasis s/p APR with end sigmoid colostomy in place, history of CVA, type 2 diabetes, hypertension, hyperlipidemia who presents to the ED for evaluation of aphasia.  History is supplemented by daughter at bedside.  Patient currently lives at home with her daughter.  She has been having chronic low back pain with left hip pain lower extremity pain.  She is following with pain management and receiving epidural spinal injections for pain management.  She has been on aspirin and Plavix for secondary stroke prevention.  Her Plavix has been held for 7 days prior to her epidural spinal injections, last one was on 12/14/2019.  She is planned to have another injection on 01/18/2020 and her Plavix has been held the last 3 days in anticipation of this procedure.  Patient states on Monday (01/10/2020) she developed difficulty with speaking, primarily unable to say what she wanted to say.  She also had some new left upper extremity weakness which has resolved.  She has been having sensory changes in her left arm as well however this has been ongoing for the last year.  She otherwise denies any chest pain, dyspnea, cough, nausea, vomiting, headache, change in vision, abdominal pain, or dysuria.  ED Course:  Initial vitals  showed BP 112/58, pulse 82, RR 18, temp 98.6 Fahrenheit, SPO2 90% on room air.  Labs show WBC 13.5, hemoglobin 12.8, platelets 295,000, sodium 139, potassium 4.7, bicarb 26, BUN 27, creatinine 0.94, LFTs within normal limits, serum glucose 130.  Urinalysis shows negative nitrites, large leukocytes, 0-5 RBC/hpf, 21-50 WBC/hpf, rare bacteria microscopy.  SARS-CoV-2 PCR is collected and pending.  Left hip x-ray shows mixed lytic and sclerotic process at the lateral aspect of the left inferior pubic ramus.  CT head without contrast shows a new hypodensity in the left parietal lobe, just inferior to the area of chronic infarction.  No acute hemorrhage noted.  Patient was given IV normal saline and the hospitalist service was consulted for further evaluation and management.  From Destiny [name] 01/13/20: Pt was found to have left PCA territory infarct w/ no hemorrhage or mass effect. Neuro was consulted and recommended speech, PT/OT consult, echo & carotid doppler. Pt refused all of those recommendations and wanted to be d/c home to resume her hospice care.   Discharge Diagnoses:  Principal Problem:   Expressive aphasia Active Problems:   Malignant neoplasm of rectum ([place])   History of CVA (cerebrovascular accident)   Type 2 diabetes mellitus with hyperlipidemia ([place])  Expressive aphasia:with history of CVA. CT head with new hypodensity in the left parietal lobe concerning for acute/subacute infarct or metastatic disease. MRI brain shows left PCA territory infarct, no hemorrhage or mass effect. Continue aspirin 81 mg daily.  Pt's family prefer to hold Plavix for now pending  as they are hoping to keep pain management appointment for epidural spinal injection  on 01/18/2020. Neuro recs apprec  COVID19: asymptomatic currently. Received COVID19 vaccine in Jan/Feb. No known sick contacts. Has not received remdesivir or steroids for treatment before but will hold as pt is currently on home level of  supplemental oxygen at 2L [place] & asymptomatic. Will start vitamin C and zinc. Airborne & contact precautions.   Likely metastatic rectal adenocarcinoma: with pulmonary metastasiss/p and sigmoid colostomy. Follows with [facility] oncology, not currently on chemotherapy due to medication intolerance.Left hip x-ray shows left inferior pubic ramus changes suggestive of possible destructive bone lesion. Continue colostomy care  Leukocytosis: reactive vs infection. Will continue to monitor   DM2: continue on SSI w/ accuchecks. Continue to hole metformin  Hypertension: holding home Toprol-XL, telmisartan, amlodipine, HCTZ for now.  Chronic pain: continue morphine prn &  home dose of norco   Anxiety: severity unknown. Continue on ativan  Discharge Instructions  Discharge Instructions    Diet - low sodium heart healthy   Complete by: As directed    Discharge instructions   Complete by: As directed    F/u w/ hospice physician as soon as possible   Increase activity slowly   Complete by: As directed      Allergies as of 01/13/2020      Reactions   Penicillins    Has patient had a PCN reaction causing immediate rash, facial/tongue/throat swelling, SOB or lightheadedness with hypotension: Unknown Has patient had a PCN reaction causing severe rash involving mucus membranes or skin necrosis: Unknown Has patient had a PCN reaction that required hospitalization: Unknown Has patient had a PCN reaction occurring within the last 10 years: No If all of the above answers are "NO", then may proceed with Cephalosporin use.   Ampicillin Rash   Amoxicillin Rash   Citalopram Diarrhea   Sulfamethoxazole-trimethoprim Rash   Possible rash on stomach and back Possible rash on stomach and back   Tramadol Itching      Medication List    TAKE these medications   amLODipine 2.5 MG tablet Commonly known as: NORVASC Take 1 tablet (2.5 mg total) by mouth daily. Take once daily if blood pressure > 160 What  changed: additional instructions   aspirin 81 MG chewable tablet Chew 1 tablet (81 mg total) by mouth daily.   clopidogrel 75 MG tablet Commonly known as: PLAVIX Take 75 mg by mouth daily.   gabapentin 100 MG capsule Commonly known as: NEURONTIN Take 100 mg by mouth 2 (two) times daily.   guaiFENesin-codeine 100-10 MG/5ML syrup Take 5 mLs by mouth every 6 (six) hours as needed for cough.   hydrochlorothiazide 12.5 MG tablet Commonly known as: HYDRODIURIL Take 12.5 mg by mouth daily as needed (swelling).   HYDROcodone-acetaminophen 5-325 MG tablet Commonly known as: NORCO/VICODIN Take 1 tablet by mouth every 4 (four) hours as needed for moderate pain.   ipratropium-albuterol 0.5-2.5 (3) MG/3ML Soln Commonly known as: DUONEB Take 3 mLs by nebulization every 4 (four) hours as needed (shortness of breath).   Levsin/SL 0.125 MG Subl Generic drug: Hyoscyamine Sulfate SL Place 0.125 mg under the tongue every 4 (four) hours as needed (secretions).   LORazepam 0.5 MG tablet Commonly known as: ATIVAN Take 0.5 mg by mouth every 4 (four) hours as needed for anxiety or sleep.   metFORMIN 500 MG tablet Commonly known as: GLUCOPHAGE Take 500 mg by mouth daily.   metoprolol succinate 25 MG 24 hr tablet Commonly known as: TOPROL-XL Take 25 mg by mouth daily.   mirtazapine 7.5 MG tablet  Commonly known as: REMERON Take 15 mg by mouth at bedtime.   morphine CONCENTRATE 10 mg / 0.5 ml concentrated solution Take 10 mg by mouth every 2 (two) hours as needed for severe pain or shortness of breath.   Oxytrol 3.9 MG/24HR Generic drug: oxybutynin Place 1 patch onto the skin every 3 (three) days.   telmisartan 80 MG tablet Commonly known as: MICARDIS Take 80 mg by mouth at bedtime.   traZODone 50 MG tablet Commonly known as: DESYREL Take 50 mg by mouth at bedtime as needed for sleep.       Allergies  Allergen Reactions  . Penicillins     Has patient had a PCN reaction causing  immediate rash, facial/tongue/throat swelling, SOB or lightheadedness with hypotension: Unknown Has patient had a PCN reaction causing severe rash involving mucus membranes or skin necrosis: Unknown Has patient had a PCN reaction that required hospitalization: Unknown Has patient had a PCN reaction occurring within the last 10 years: No If all of the above answers are "NO", then may proceed with Cephalosporin use.   . Ampicillin Rash  . Amoxicillin Rash  . Citalopram Diarrhea  . Sulfamethoxazole-Trimethoprim Rash    Possible rash on stomach and back Possible rash on stomach and back   . Tramadol Itching    Consultations:  neuro   Procedures/Studies: CT HEAD WO CONTRAST  Result Date: 01/12/2020 CLINICAL DATA:  Speech difficulty, stage IV lung cancer EXAM: CT HEAD WITHOUT CONTRAST TECHNIQUE: Contiguous axial images were obtained from the base of the skull through the vertex without intravenous contrast. COMPARISON:  07/16/2019 FINDINGS: Brain: Rounded hypodensity in the left parietal lobe reference image 17 measures 3.1 x 2.6 cm. This is just inferior to an area of chronic encephalomalacia and prior infarct. Differential diagnosis includes subacute infarct or metastatic disease in a patient with a known history of metastatic lung cancer. No signs of acute infarct or hemorrhage. The lateral ventricles and midline structures are stable, with chronic small vessel ischemic changes within the thalami and bilateral basal ganglia. No acute extra-axial fluid collections. No mass effect. Vascular: No hyperdense vessel or unexpected calcification. Skull: Normal. Negative for fracture or focal lesion. Sinuses/Orbits: No acute finding. Postsurgical changes are seen within the maxillary sinuses and ethmoid air cells. Other: None IMPRESSION: 1. New hypodensity in the left parietal lobe, just inferior to an area of chronic infarction. Differential diagnosis includes metastatic disease or acute/subacute  infarct. MRI may be useful for further evaluation. 2. No acute hemorrhage. Electronically Signed   By: Randa Ngo M.D.   On: 01/12/2020 16:57   MR BRAIN WO CONTRAST  Result Date: 01/12/2020 CLINICAL DATA:  Stroke follow-up. Difficulty speaking. Cephalopathy. EXAM: MRI HEAD WITHOUT CONTRAST TECHNIQUE: Multiplanar, multiecho pulse sequences of the brain and surrounding structures were obtained without intravenous contrast. COMPARISON:  Brain MRI 08/03/2019 FINDINGS: Brain: There is an acute infarct within the left PCA territory, predominantly in the lateral left temporal lobe. No other site of acute ischemia. There are old bilateral occipital infarcts and old cerebellar and basal ganglia small vessel infarcts. There is multifocal periventricular Henle matter hyperintensity, most often a result of chronic microvascular ischemia. There is generalized atrophy without lobar predilection. Area of hemosiderin deposition in the posterior left parietal lobe. Vascular: Normal flow voids. Skull and upper cervical spine: Normal marrow signal. Sinuses/Orbits: Negative. Other: None. IMPRESSION: Acute left PCA territory infarct, predominantly in the lateral left temporal lobe. No hemorrhage or mass effect. Electronically Signed   By: Ulyses Jarred  M.D.   On: 01/12/2020 23:45   DG PAIN CLINIC C-ARM 1-60 MIN NO REPORT  Result Date: 12/30/2019 Fluoro was used, but no Radiologist interpretation will be provided. Please refer to "NOTES" tab for provider progress note.  DG HIP UNILAT W OR W/O PELVIS 2-3 VIEWS LEFT  Result Date: 01/12/2020 CLINICAL DATA:  Chronic BILATERAL hip pain LEFT greater than RIGHT; history rectal cancer EXAM: DG HIP (WITH OR WITHOUT PELVIS) 2-3V LEFT COMPARISON:  None Correlation: CT abdomen and pelvis 03/04/2018, PET-CT 03/13/2018 FINDINGS: Osseous demineralization. Hip and SI joint spaces preserved. Mixed lytic and sclerotic expansile process identified at the LEFT inferior pubic ramus laterally  new since 2019. Finding could represent a destructive bone lesion, healing fracture or pathologic fracture. No additional fracture, dislocation or bone destruction. Multilevel degenerative disc and facet disease changes of lumbar spine. IMPRESSION: Mixed lytic and sclerotic process at the lateral aspect of the LEFT inferior pubic ramus, with differential diagnosis including destructive bone lesion, healing fracture and pathologic fracture; further evaluation by MR recommended to exclude pathologic/destructive lesion. These results will be called to the ordering clinician or representative by the Radiologist Assistant, and communication documented in the PACS or [employer]. Electronically Signed   By: Lavonia Dana M.D.   On: 01/12/2020 08:36   DG HIP UNILAT W OR W/O PELVIS 2-3 VIEWS RIGHT  Result Date: 01/12/2020 CLINICAL DATA:  Right hip pain EXAM: DG HIP (WITH OR WITHOUT PELVIS) 2-3V RIGHT COMPARISON:  None. FINDINGS: Mild diffuse osteopenia. The no acute fracture or subluxation identified. Marked degenerative disc disease is identified within the visualized portions of the lower lumbar spine. Mild bilateral hip joint osteoarthritis. There is abnormal bone mineralization involving the left inferior pubic rami. There is cortical irregularity and increased lucency. IMPRESSION: 1. Abnormal appearance of the left inferior pubic rami with cortical irregularity and increased trabecular lucency. Cannot rule out osseous metastasis. 2. Mild bilateral hip osteoarthritis and lumbar degenerative disc disease. Electronically Signed   By: Kerby Moors M.D.   On: 01/12/2020 08:35       Subjective: Pt would like to be d/c home to resume home hospice   Discharge Exam: Vitals:   01/13/20 0013 01/13/20 0736  BP: (!) 160/75 (!) 148/69  Pulse: 73 82  Resp: 18 20  Temp: 98 F (36.7 C) 97.7 F (36.5 C)  SpO2: 100% 98%   Vitals:   01/12/20 2243 01/12/20 2349 01/13/20 0013 01/13/20 0736  BP:  (!) 148/73 (!)  160/75 (!) 148/69  Pulse: 69 75 73 82  Resp: (!) 22 20 18 20   Temp:   98 F (36.7 C) 97.7 F (36.5 C)  TempSrc:   Oral Oral  SpO2: 98% 94% 100% 98%  Weight:      Height:        General: Pt is alert, awake, not in acute distress Cardiovascular: \ S1/S2 +, no rubs, no gallops Respiratory: diminished breath sounds b/l otherwise clear Abdominal: Soft, NT, ND, bowel sounds + Extremities: no cyanosis    The results of significant diagnostics from this hospitalization (including imaging, microbiology, ancillary and laboratory) are listed below for reference.     Microbiology: Recent Results (from the past 240 hour(s))  SARS CORONAVIRUS 2 (TAT 6-24 HRS) Nasopharyngeal Nasopharyngeal Swab     Status: Abnormal   Collection Time: 01/12/20  7:22 PM   Specimen: Nasopharyngeal Swab  Result Value Ref Range Status   SARS Coronavirus 2 POSITIVE (A) NEGATIVE Final    Comment: RESULT CALLED TO, READ  BACK BY AND VERIFIED WITH: RN BUCTH WOOD AT [place] ON 01/13/2020 (NOTE) SARS-CoV-2 target nucleic acids are DETECTED. The SARS-CoV-2 RNA is generally detectable in upper and lower respiratory specimens during the acute phase of infection. Positive results are indicative of the presence of SARS-CoV-2 RNA. Clinical correlation with patient history and other diagnostic information is  necessary to determine patient infection status. Positive results do not rule out bacterial infection or co-infection with other viruses.  The expected result is Negative. Fact Sheet for Patients: SugarRoll.be Fact Sheet for Healthcare Providers: https://www.woods-mathews.com/ This test is not yet approved or cleared by the [place] FDA and  has been authorized for detection and/or diagnosis of SARS-CoV-2 by FDA under an Emergency Use Authorization (EUA). This EUA will remain  in effect (meaning this test c an be used) for the duration of the COVID-19  declaration under Section 564(b)(1) of the Act, 21 U.S.C. section 360bbb-3(b)(1), unless the authorization is terminated or revoked sooner. Performed at [facility], [place] [address] [place], [place] [address]      Labs: BNP (last 3 results) No results for input(s): BNP in the last 8760 hours. Basic Metabolic Panel: Recent Labs  Lab 01/12/20 1547  NA 139  K 4.7  CL 103  CO2 26  GLUCOSE 130*  BUN 27*  CREATININE 0.94  CALCIUM 8.9   Liver Function Tests: Recent Labs  Lab 01/12/20 1547  AST 15  ALT 9  ALKPHOS 82  BILITOT 0.4  PROT 6.7  ALBUMIN 3.6   No results for input(s): LIPASE, AMYLASE in the last 168 hours. No results for input(s): AMMONIA in the last 168 hours. CBC: Recent Labs  Lab 01/12/20 1547  WBC 13.5*  NEUTROABS 10.2*  HGB 12.8  HCT 39.8  MCV 91.9  PLT 295   Cardiac Enzymes: No results for input(s): CKTOTAL, CKMB, CKMBINDEX, TROPONINI in the last 168 hours. BNP: Invalid input(s): POCBNP CBG: Recent Labs  Lab 01/12/20 1546 01/13/20 0738 01/13/20 1156  GLUCAP 112* 122* 106*   D-Dimer No results for input(s): DDIMER in the last 72 hours. Hgb A1c Recent Labs    01/13/20 0438  HGBA1C 7.4*   Lipid Profile Recent Labs    01/13/20 0438  CHOL 186  HDL 48  LDLCALC 125*  TRIG 63  CHOLHDL 3.9   Thyroid function studies No results for input(s): TSH, T4TOTAL, T3FREE, THYROIDAB in the last 72 hours.  Invalid input(s): FREET3 Anemia work up No results for input(s): VITAMINB12, FOLATE, FERRITIN, TIBC, IRON, RETICCTPCT in the last 72 hours. Urinalysis    Component Value Date/Time   COLORURINE STRAW (A) 01/12/2020 1728   APPEARANCEUR CLEAR (A) 01/12/2020 1728   LABSPEC 1.010 01/12/2020 1728   PHURINE 5.0 01/12/2020 1728   GLUCOSEU NEGATIVE 01/12/2020 1728   HGBUR SMALL (A) 01/12/2020 1728   BILIRUBINUR NEGATIVE 01/12/2020 1728   KETONESUR NEGATIVE 01/12/2020 1728   PROTEINUR NEGATIVE 01/12/2020 1728   NITRITE NEGATIVE  01/12/2020 1728   LEUKOCYTESUR LARGE (A) 01/12/2020 1728   Sepsis Labs Invalid input(s): PROCALCITONIN,  WBC,  LACTICIDVEN Microbiology Recent Results (from the past 240 hour(s))  SARS CORONAVIRUS 2 (TAT 6-24 HRS) Nasopharyngeal Nasopharyngeal Swab     Status: Abnormal   Collection Time: 01/12/20  7:22 PM   Specimen: Nasopharyngeal Swab  Result Value Ref Range Status   SARS Coronavirus 2 POSITIVE (A) NEGATIVE Final    Comment: RESULT CALLED TO, READ BACK BY AND VERIFIED WITH: RN BUCTH WOOD AT 0536 BY  MESSAN HOUEGNIFIO ON 01/13/2020 (NOTE) SARS-CoV-2 target nucleic acids are DETECTED. The SARS-CoV-2 RNA is generally detectable in upper and lower respiratory specimens during the acute phase of infection. Positive results are indicative of the presence of SARS-CoV-2 RNA. Clinical correlation with patient history and other diagnostic information is  necessary to determine patient infection status. Positive results do not rule out bacterial infection or co-infection with other viruses.  The expected result is Negative. Fact Sheet for Patients: SugarRoll.be Fact Sheet for Healthcare Providers: https://www.woods-mathews.com/ This test is not yet approved or cleared by the [place] FDA and  has been authorized for detection and/or diagnosis of SARS-CoV-2 by FDA under an Emergency Use Authorization (EUA). This EUA will remain  in effect (meaning this test c an be used) for the duration of the COVID-19 declaration under Section 564(b)(1) of the Act, 21 U.S.C. section 360bbb-3(b)(1), unless the authorization is terminated or revoked sooner. Performed at [facility], [place] [address] [place], [place] [address]      Time coordinating discharge: Over 30 minutes  SIGNED:   Wyvonnia Dusky, MD  [facility] 01/13/2020, 3:44 PM Pager   If 7PM-7AM, please contact night-coverage [email or web address]

## 2020-01-13 NOTE — Telephone Encounter (Signed)
Thanks for letting us know. Not sure if Dr Dossie Arbour will be able to offer her anything at this time.

## 2020-01-13 NOTE — Telephone Encounter (Signed)
Wanted to let you know about Destiny [name]'s condition.

## 2020-01-13 NOTE — Consult Note (Signed)
Requesting Physician: Jimmye Norman    Chief Complaint: Difficulty with speech  I have been asked by Dr. Jimmye Norman to see this patient in consultation for TIA.  HPI: Destiny [name] is an 86 y.o. female with medical history significant for malignant rectal adenocarcinoma with pulmonary metastasis s/p APR with end sigmoid colostomy in place, history of CVA, type 2 diabetes, hypertension, hyperlipidemia who presents to the ED for evaluation of difficulty with speech.  Per daughter on 01/10/2020 she awakened with difficulty speaking, primarily unable to say what she wanted to say.  She also had some new left upper extremity weakness as well.  Patient scheduled for an epidural injection and was therefore off of her antiplatelet therapy.   Symptoms now resolved.  Initial NIHSS of 1.  Date last known well: 01/09/2020 Time last known well: Time: 21:00 tPA Given: No: Outside time window  Past Medical History:  Diagnosis Date  . Allergy   . Cancer ([place]) 03/08/2018  . Diabetes mellitus without complication ([place])   . GERD (gastroesophageal reflux disease)   . HTN (hypertension)   . Hyperlipidemia   . Shingles   . Stroke [facility])     Past Surgical History:  Procedure Laterality Date  . APPENDECTOMY    . BACK SURGERY    . COLONOSCOPY WITH PROPOFOL N/A 03/06/2018   Procedure: COLONOSCOPY WITH PROPOFOL;  Surgeon: Virgel Manifold, MD;  Location: [facility] ENDOSCOPY;  Service: Endoscopy;  Laterality: N/A;  . FLEXIBLE SIGMOIDOSCOPY Left 03/04/2018   Procedure: Colonoscopy/FLEXIBLE SIGMOIDOSCOPY;  Surgeon: Virgel Manifold, MD;  Location: [facility] ENDOSCOPY;  Service: Endoscopy;  Laterality: Left;  . FOOT SURGERY Bilateral    [place]  . JOINT REPLACEMENT    . KNEE SURGERY Bilateral   . TONSILECTOMY, ADENOIDECTOMY, BILATERAL MYRINGOTOMY AND TUBES      Family History  Problem Relation Age of Onset  . Stroke Mother   . Stroke Father    Social History:  reports that she has never smoked. She has never used  smokeless tobacco. She reports that she does not drink alcohol or use drugs.  Allergies:  Allergies  Allergen Reactions  . Penicillins     Has patient had a PCN reaction causing immediate rash, facial/tongue/throat swelling, SOB or lightheadedness with hypotension: Unknown Has patient had a PCN reaction causing severe rash involving mucus membranes or skin necrosis: Unknown Has patient had a PCN reaction that required hospitalization: Unknown Has patient had a PCN reaction occurring within the last 10 years: No If all of the above answers are "NO", then may proceed with Cephalosporin use.   . Ampicillin Rash  . Amoxicillin Rash  . Citalopram Diarrhea  . Sulfamethoxazole-Trimethoprim Rash    Possible rash on stomach and back Possible rash on stomach and back   . Tramadol Itching    Medications:  I have reviewed the patient's current medications. Prior to Admission:  Medications Prior to Admission  Medication Sig Dispense Refill Last Dose  . amLODipine (NORVASC) 2.5 MG tablet Take 1 tablet (2.5 mg total) by mouth daily. Take once daily if blood pressure > 160 (Patient taking differently: Take 2.5 mg by mouth daily. ) 14 tablet 0 01/12/2020 at 0900  . aspirin 81 MG chewable tablet Chew 1 tablet (81 mg total) by mouth daily.   01/12/2020 at 0900  . clopidogrel (PLAVIX) 75 MG tablet Take 75 mg by mouth daily.   01/02/2020 at Unknown  . gabapentin (NEURONTIN) 100 MG capsule Take 100 mg by mouth 2 (two) times daily.  01/12/2020 at 0900  . guaiFENesin-codeine 100-10 MG/5ML syrup Take 5 mLs by mouth every 6 (six) hours as needed for cough.    Unknown at PRN  . hydrochlorothiazide (HYDRODIURIL) 12.5 MG tablet Take 12.5 mg by mouth daily as needed (swelling).    01/12/2020 at PRN  . HYDROcodone-acetaminophen (NORCO/VICODIN) 5-325 MG tablet Take 1 tablet by mouth every 4 (four) hours as needed for moderate pain.    01/12/2020 at 0900  . ipratropium-albuterol (DUONEB) 0.5-2.5 (3) MG/3ML SOLN Take 3  mLs by nebulization every 4 (four) hours as needed (shortness of breath).    Unknown at PRN  . LEVSIN/SL 0.125 MG SUBL Place 0.125 mg under the tongue every 4 (four) hours as needed (secretions).    Unknown at PRN  . LORazepam (ATIVAN) 0.5 MG tablet Take 0.5 mg by mouth every 4 (four) hours as needed for anxiety or sleep.    01/12/2020 at 0900  . metFORMIN (GLUCOPHAGE) 500 MG tablet Take 500 mg by mouth daily.    01/12/2020 at 0900  . metoprolol succinate (TOPROL-XL) 25 MG 24 hr tablet Take 25 mg by mouth daily.    01/12/2020 at 0900  . mirtazapine (REMERON) 7.5 MG tablet Take 15 mg by mouth at bedtime.   01/11/2020 at 2100  . Morphine Sulfate (MORPHINE CONCENTRATE) 10 mg / 0.5 ml concentrated solution Take 10 mg by mouth every 2 (two) hours as needed for severe pain or shortness of breath.    Unknown at PRN  . OXYTROL 3.9 MG/24HR Place 1 patch onto the skin every 3 (three) days.    Unknown at Unknown  . telmisartan (MICARDIS) 80 MG tablet Take 80 mg by mouth at bedtime.    01/11/2020 at 2100  . traZODone (DESYREL) 50 MG tablet Take 50 mg by mouth at bedtime as needed for sleep.    Unknown at PRN   Scheduled: . vitamin C  500 mg Oral Daily  . aspirin  81 mg Oral Daily  . gabapentin  100 mg Oral BID  . heparin  5,000 Units Subcutaneous Q8H  . insulin aspart  0-9 Units Subcutaneous TID WC  . mirtazapine  15 mg Oral QHS  . zinc sulfate  220 mg Oral Daily    ROS: History obtained from the patient  General ROS: negative for - chills, fatigue, fever, night sweats, weight gain or weight loss Psychological ROS: negative for - behavioral disorder, hallucinations, memory difficulties, mood swings or suicidal ideation Ophthalmic ROS: negative for - blurry vision, double vision, eye pain or loss of vision ENT ROS: negative for - epistaxis, nasal discharge, oral lesions, sore throat, tinnitus or vertigo Allergy and Immunology ROS: negative for - hives or itchy/watery eyes Hematological and Lymphatic ROS:  negative for - bleeding problems, bruising or swollen lymph nodes Endocrine ROS: negative for - galactorrhea, hair pattern changes, polydipsia/polyuria or temperature intolerance Respiratory ROS: wheezing Cardiovascular ROS: negative for - chest pain, dyspnea on exertion, edema or irregular heartbeat Gastrointestinal ROS: negative for - abdominal pain, diarrhea, hematemesis, nausea/vomiting or stool incontinence Genito-Urinary ROS: negative for - dysuria, hematuria, incontinence or urinary frequency/urgency Musculoskeletal ROS: left hip pain Neurological ROS: as noted in HPI, left arm sumbness Dermatological ROS: negative for rash and skin lesion changes  Physical Examination: Blood pressure (!) 148/69, pulse 82, temperature 97.7 F (36.5 C), temperature source Oral, resp. rate 20, height 5' (1.524 m), weight 56.7 kg, SpO2 98 %.  HEENT-  Normocephalic, no lesions, without obvious abnormality.  Normal external eye and  conjunctiva.  Normal TM's bilaterally.  Normal auditory canals and external ears. Normal external nose, mucus membranes and septum.  Normal pharynx. Cardiovascular- S1, S2 normal, pulses palpable throughout   Lungs- chest clear, no wheezing, rales, normal symmetric air entry Abdomen- soft, non-tender; bowel sounds normal; no masses,  no organomegaly Extremities- mild BLE edema Lymph-no adenopathy palpable Musculoskeletal-no joint tenderness, deformity or swelling Skin-warm and dry, no hyperpigmentation, vitiligo, or suspicious lesions  Neurological Examination   Mental Status: Alert, oriented, thought content appropriate.  Speech fluent without evidence of aphasia.  Able to follow 3 step commands with some reinforcement required. Cranial Nerves: II: Visual fields grossly normal, pupils equal, round, reactive to light and accommodation III,IV, VI: ptosis not present, extra-ocular motions intact bilaterally V,VII: smile symmetric, facial light touch sensation normal  bilaterally VIII: hearing normal bilaterally IX,X: gag reflex present XI: bilateral shoulder shrug XII: midline tongue extension Motor: Right : Upper extremity   5/5    Left:     Upper extremity   5/5  Lower extremity   5/5     Lower extremity   4/5 Tone and bulk:normal tone throughout; no atrophy noted Sensory: Pinprick and light touch intact throughout, bilaterally Deep Tendon Reflexes: Symmetric throughout Plantars: Right: mute   Left: mute Cerebellar: Normal finger-to-nose testing bilaterally Gait: not tested due to safety concerns   Laboratory Studies:  Basic Metabolic Panel: Recent Labs  Lab 01/12/20 1547  NA 139  K 4.7  CL 103  CO2 26  GLUCOSE 130*  BUN 27*  CREATININE 0.94  CALCIUM 8.9    Liver Function Tests: Recent Labs  Lab 01/12/20 1547  AST 15  ALT 9  ALKPHOS 82  BILITOT 0.4  PROT 6.7  ALBUMIN 3.6   No results for input(s): LIPASE, AMYLASE in the last 168 hours. No results for input(s): AMMONIA in the last 168 hours.  CBC: Recent Labs  Lab 01/12/20 1547  WBC 13.5*  NEUTROABS 10.2*  HGB 12.8  HCT 39.8  MCV 91.9  PLT 295    Cardiac Enzymes: No results for input(s): CKTOTAL, CKMB, CKMBINDEX, TROPONINI in the last 168 hours.  BNP: Invalid input(s): POCBNP  CBG: Recent Labs  Lab 01/12/20 1546 01/13/20 0738  GLUCAP 112* 122*    Microbiology: Results for orders placed or performed during the hospital encounter of 01/12/20  SARS CORONAVIRUS 2 (TAT 6-24 HRS) Nasopharyngeal Nasopharyngeal Swab     Status: Abnormal   Collection Time: 01/12/20  7:22 PM   Specimen: Nasopharyngeal Swab  Result Value Ref Range Status   SARS Coronavirus 2 POSITIVE (A) NEGATIVE Final    Comment: RESULT CALLED TO, READ BACK BY AND VERIFIED WITH: RN BUCTH WOOD AT [place] ON 01/13/2020 (NOTE) SARS-CoV-2 target nucleic acids are DETECTED. The SARS-CoV-2 RNA is generally detectable in upper and lower respiratory specimens during the acute  phase of infection. Positive results are indicative of the presence of SARS-CoV-2 RNA. Clinical correlation with patient history and other diagnostic information is  necessary to determine patient infection status. Positive results do not rule out bacterial infection or co-infection with other viruses.  The expected result is Negative. Fact Sheet for Patients: SugarRoll.be Fact Sheet for Healthcare Providers: https://www.woods-mathews.com/ This test is not yet approved or cleared by the [place] FDA and  has been authorized for detection and/or diagnosis of SARS-CoV-2 by FDA under an Emergency Use Authorization (EUA). This EUA will remain  in effect (meaning this test c an be used) for the duration of  the COVID-19 declaration under Section 564(b)(1) of the Act, 21 U.S.C. section 360bbb-3(b)(1), unless the authorization is terminated or revoked sooner. Performed at [facility], [place] [address], [place], [place] [address]     Coagulation Studies: Recent Labs    01/12/20 1547  LABPROT 12.8  INR 1.0    Urinalysis:  Recent Labs  Lab 01/12/20 1728  COLORURINE STRAW*  LABSPEC 1.010  PHURINE 5.0  GLUCOSEU NEGATIVE  HGBUR SMALL*  BILIRUBINUR NEGATIVE  KETONESUR NEGATIVE  PROTEINUR NEGATIVE  NITRITE NEGATIVE  LEUKOCYTESUR LARGE*    Lipid Panel:    Component Value Date/Time   CHOL 186 01/13/2020 0438   TRIG 63 01/13/2020 0438   HDL 48 01/13/2020 0438   CHOLHDL 3.9 01/13/2020 0438   VLDL 13 01/13/2020 0438   LDLCALC 125 (H) 01/13/2020 0438    HgbA1C:  Lab Results  Component Value Date   HGBA1C 6.5 (H) 07/17/2019    Urine Drug Screen:      Component Value Date/Time   LABOPIA NONE DETECTED 07/30/2017 0121   COCAINSCRNUR NONE DETECTED 07/30/2017 0121   LABBENZ NONE DETECTED 07/30/2017 0121   AMPHETMU NONE DETECTED 07/30/2017 0121   THCU NONE DETECTED 07/30/2017 0121   LABBARB NONE DETECTED 07/30/2017 0121     Alcohol Level: No results for input(s): ETH in the last 168 hours.  Other results: EKG: sinus rhythm at 72 bpm.  Imaging: CT HEAD WO CONTRAST  Result Date: 01/12/2020 CLINICAL DATA:  Speech difficulty, stage IV lung cancer EXAM: CT HEAD WITHOUT CONTRAST TECHNIQUE: Contiguous axial images were obtained from the base of the skull through the vertex without intravenous contrast. COMPARISON:  07/16/2019 FINDINGS: Brain: Rounded hypodensity in the left parietal lobe reference image 17 measures 3.1 x 2.6 cm. This is just inferior to an area of chronic encephalomalacia and prior infarct. Differential diagnosis includes subacute infarct or metastatic disease in a patient with a known history of metastatic lung cancer. No signs of acute infarct or hemorrhage. The lateral ventricles and midline structures are stable, with chronic small vessel ischemic changes within the thalami and bilateral basal ganglia. No acute extra-axial fluid collections. No mass effect. Vascular: No hyperdense vessel or unexpected calcification. Skull: Normal. Negative for fracture or focal lesion. Sinuses/Orbits: No acute finding. Postsurgical changes are seen within the maxillary sinuses and ethmoid air cells. Other: None IMPRESSION: 1. New hypodensity in the left parietal lobe, just inferior to an area of chronic infarction. Differential diagnosis includes metastatic disease or acute/subacute infarct. MRI may be useful for further evaluation. 2. No acute hemorrhage. Electronically Signed   By: Randa Ngo M.D.   On: 01/12/2020 16:57   MR BRAIN WO CONTRAST  Result Date: 01/12/2020 CLINICAL DATA:  Stroke follow-up. Difficulty speaking. Cephalopathy. EXAM: MRI HEAD WITHOUT CONTRAST TECHNIQUE: Multiplanar, multiecho pulse sequences of the brain and surrounding structures were obtained without intravenous contrast. COMPARISON:  Brain MRI 08/03/2019 FINDINGS: Brain: There is an acute infarct within the left PCA territory, predominantly  in the lateral left temporal lobe. No other site of acute ischemia. There are old bilateral occipital infarcts and old cerebellar and basal ganglia small vessel infarcts. There is multifocal periventricular [place] matter hyperintensity, most often a result of chronic microvascular ischemia. There is generalized atrophy without lobar predilection. Area of hemosiderin deposition in the posterior left parietal lobe. Vascular: Normal flow voids. Skull and upper cervical spine: Normal marrow signal. Sinuses/Orbits: Negative. Other: None. IMPRESSION: Acute left PCA territory infarct, predominantly in the lateral left temporal lobe. No  hemorrhage or mass effect. Electronically Signed   By: Ulyses Jarred M.D.   On: 01/12/2020 23:45   DG HIP UNILAT W OR W/O PELVIS 2-3 VIEWS LEFT  Result Date: 01/12/2020 CLINICAL DATA:  Chronic BILATERAL hip pain LEFT greater than RIGHT; history rectal cancer EXAM: DG HIP (WITH OR WITHOUT PELVIS) 2-3V LEFT COMPARISON:  None Correlation: CT abdomen and pelvis 03/04/2018, PET-CT 03/13/2018 FINDINGS: Osseous demineralization. Hip and SI joint spaces preserved. Mixed lytic and sclerotic expansile process identified at the LEFT inferior pubic ramus laterally new since 2019. Finding could represent a destructive bone lesion, healing fracture or pathologic fracture. No additional fracture, dislocation or bone destruction. Multilevel degenerative disc and facet disease changes of lumbar spine. IMPRESSION: Mixed lytic and sclerotic process at the lateral aspect of the LEFT inferior pubic ramus, with differential diagnosis including destructive bone lesion, healing fracture and pathologic fracture; further evaluation by MR recommended to exclude pathologic/destructive lesion. These results will be called to the ordering clinician or representative by the Radiologist Assistant, and communication documented in the PACS or [employer]. Electronically Signed   By: Lavonia Dana M.D.   On: 01/12/2020  08:36   DG HIP UNILAT W OR W/O PELVIS 2-3 VIEWS RIGHT  Result Date: 01/12/2020 CLINICAL DATA:  Right hip pain EXAM: DG HIP (WITH OR WITHOUT PELVIS) 2-3V RIGHT COMPARISON:  None. FINDINGS: Mild diffuse osteopenia. The no acute fracture or subluxation identified. Marked degenerative disc disease is identified within the visualized portions of the lower lumbar spine. Mild bilateral hip joint osteoarthritis. There is abnormal bone mineralization involving the left inferior pubic rami. There is cortical irregularity and increased lucency. IMPRESSION: 1. Abnormal appearance of the left inferior pubic rami with cortical irregularity and increased trabecular lucency. Cannot rule out osseous metastasis. 2. Mild bilateral hip osteoarthritis and lumbar degenerative disc disease. Electronically Signed   By: Kerby Moors M.D.   On: 01/12/2020 08:35    Assessment: 86 y.o. female with medical history significant for malignant rectal adenocarcinoma with pulmonary metastasis s/p APR with end sigmoid colostomy in place, history of CVA, type 2 diabetes, hypertension, hyperlipidemia with an episode of aphasia and LUE weakness that has resolved.  Patient on ASA and Plavix with Plavix currently being held for upcoming scheduled procedure.  MRI of the brain personally reviewed and shows an acute left PCA infarct.  Etiology likely embolic and related to hypercoagulability from adenocarcinoma and being off antiplatelet therapy.  Patient on hospice and with advanced age.  Conversation had at length with daughter concerning how aggressive a work and intervention they would consider and per daughter they are open to all options therefore full stroke work up should be performed.   A1c 7.4, LDL 125.  Stroke Risk Factors - diabetes mellitus, hyperlipidemia and hypertension  Plan: 1. PT consult, OT consult, Speech consult 2. Echocardiogram 3. Carotid dopplers 4. Prophylactic therapy-Would start patient back on ASA 81mg  daily and  Plavix 75mg  daily 5. Telemetry monitoring 6. Frequent neuro checks 7. Blood sugar management with target A1c<7.0 8. Statin for lipid management with target LDL<70.   Alexis Goodell, MD Neurology [phone] 01/13/2020, 10:43 AM

## 2020-01-13 NOTE — Progress Notes (Signed)
Patient ready for discharge, removed tele box, removed IV. AVS given to patient and daughter. No complaints of anything at this time.

## 2020-01-13 NOTE — Progress Notes (Signed)
PROGRESS NOTE    Destiny [name]  [id number] DOB: 04/04/1924 DOA: 01/12/2020 PCP: Dion Body, MD      Assessment & Plan:   Principal Problem:   Expressive aphasia Active Problems:   Malignant neoplasm of rectum ([place])   History of CVA (cerebrovascular accident)   Type 2 diabetes mellitus with hyperlipidemia ([place])  Expressive aphasia:with history of CVA. CT head with new hypodensity in the left parietal lobe concerning for acute/subacute infarct or metastatic disease. MRI brain shows left PCA territory infarct, no hemorrhage or mass effect. Continue aspirin 81 mg daily.  Pt's family prefer to hold Plavix for now pending  as they are hoping to keep pain management appointment for epidural spinal injection on 01/18/2020. Neuro consulted  COVID19: asymptomatic currently. Received COVID19 vaccine in Jan/Feb. No known sick contacts. Has not received remdesivir or steroids for treatment before but will hold as pt is currently on home level of supplemental oxygen at 2L [place] & asymptomatic. Will start vitamin C and zinc. Airborne & contact precautions.   Likely metastatic rectal adenocarcinoma: with pulmonary metastasis s/p and sigmoid colostomy. Follows with [facility] oncology, not currently on chemotherapy due to medication intolerance.Left hip x-ray shows left inferior pubic ramus changes suggestive of possible destructive bone lesion. Continue colostomy care  Leukocytosis: reactive vs infection. Will continue to monitor   DM2: continue on SSI w/ accuchecks. Continue to hole metformin  Hypertension: holding home Toprol-XL, telmisartan, amlodipine, HCTZ for now.  Chronic pain: continue morphine prn &  home dose of norco   Anxiety: severity unknown. Continue on ativan    DVT prophylaxis: heparin Code Status: DNR Family Communication: discussed w/ pt's daughter who is bedside and answered her questions Disposition Plan: depends on PT/OT recs although pt's daughter does not want her  going to rehab though    Consultants:   neuro   Procedures:    Antimicrobials:    Subjective: Pt c/o fatigue   Objective: Vitals:   01/12/20 2200 01/12/20 2243 01/12/20 2349 01/13/20 0013  BP: (!) 116/56  (!) 148/73 (!) 160/75  Pulse: 68 69 75 73  Resp: 20 (!) 22 20 18   Temp:    98 F (36.7 C)  TempSrc:    Oral  SpO2: 97% 98% 94% 100%  Weight:      Height:       No intake or output data in the 24 hours ending 01/13/20 0714 Filed Weights   01/12/20 1533  Weight: 56.7 kg    Examination:  General exam: Appears calm and comfortable  Respiratory system: diminished breath sounds b/l otherwise clear. Cardiovascular system: S1 & S2+. No rubs, gallops or clicks. Gastrointestinal system: Abdomen is nondistended, soft and nontender.  Normal bowel sounds heard. Central nervous system: Alert and oriented. Moves all 4 extremities  Psychiatry: Judgement and insight appear normal. Mood & affect appropriate.     Data Reviewed: I have personally reviewed following labs and imaging studies  CBC: Recent Labs  Lab 01/12/20 1547  WBC 13.5*  NEUTROABS 10.2*  HGB 12.8  HCT 39.8  MCV 91.9  PLT [id number]   Basic Metabolic Panel: Recent Labs  Lab 01/12/20 1547  NA 139  K 4.7  CL 103  CO2 26  GLUCOSE 130*  BUN 27*  CREATININE 0.94  CALCIUM 8.9   GFR: Estimated Creatinine Clearance: 28.9 mL/min (by C-G formula based on SCr of 0.94 mg/dL). Liver Function Tests: Recent Labs  Lab 01/12/20 1547  AST 15  ALT 9  ALKPHOS 82  BILITOT 0.4  PROT 6.7  ALBUMIN 3.6   No results for input(s): LIPASE, AMYLASE in the last 168 hours. No results for input(s): AMMONIA in the last 168 hours. Coagulation Profile: Recent Labs  Lab 01/12/20 1547  INR 1.0   Cardiac Enzymes: No results for input(s): CKTOTAL, CKMB, CKMBINDEX, TROPONINI in the last 168 hours. BNP (last 3 results) No results for input(s): PROBNP in the last 8760 hours. HbA1C: No results for input(s): HGBA1C in the  last 72 hours. CBG: Recent Labs  Lab 01/12/20 1546  GLUCAP 112*   Lipid Profile: Recent Labs    01/13/20 0438  CHOL 186  HDL 48  LDLCALC 125*  TRIG 63  CHOLHDL 3.9   Thyroid Function Tests: No results for input(s): TSH, T4TOTAL, FREET4, T3FREE, THYROIDAB in the last 72 hours. Anemia Panel: No results for input(s): VITAMINB12, FOLATE, FERRITIN, TIBC, IRON, RETICCTPCT in the last 72 hours. Sepsis Labs: No results for input(s): PROCALCITON, LATICACIDVEN in the last 168 hours.  Recent Results (from the past 240 hour(s))  SARS CORONAVIRUS 2 (TAT 6-24 HRS) Nasopharyngeal Nasopharyngeal Swab     Status: Abnormal   Collection Time: 01/12/20  7:22 PM   Specimen: Nasopharyngeal Swab  Result Value Ref Range Status   SARS Coronavirus 2 POSITIVE (A) NEGATIVE Final    Comment: RESULT CALLED TO, READ BACK BY AND VERIFIED WITH: RN BUCTH WOOD AT [place] ON 01/13/2020 (NOTE) SARS-CoV-2 target nucleic acids are DETECTED. The SARS-CoV-2 RNA is generally detectable in upper and lower respiratory specimens during the acute phase of infection. Positive results are indicative of the presence of SARS-CoV-2 RNA. Clinical correlation with patient history and other diagnostic information is  necessary to determine patient infection status. Positive results do not rule out bacterial infection or co-infection with other viruses.  The expected result is Negative. Fact Sheet for Patients: SugarRoll.be Fact Sheet for Healthcare Providers: https://www.woods-mathews.com/ This test is not yet approved or cleared by the [place] FDA and  has been authorized for detection and/or diagnosis of SARS-CoV-2 by FDA under an Emergency Use Authorization (EUA). This EUA will remain  in effect (meaning this test c an be used) for the duration of the COVID-19 declaration under Section 564(b)(1) of the Act, 21 U.S.C. section 360bbb-3(b)(1), unless the  authorization is terminated or revoked sooner. Performed at [facility], [place] [address] [place], [place] [address]          Radiology Studies: CT HEAD WO CONTRAST  Result Date: 01/12/2020 CLINICAL DATA:  Speech difficulty, stage IV lung cancer EXAM: CT HEAD WITHOUT CONTRAST TECHNIQUE: Contiguous axial images were obtained from the base of the skull through the vertex without intravenous contrast. COMPARISON:  07/16/2019 FINDINGS: Brain: Rounded hypodensity in the left parietal lobe reference image 17 measures 3.1 x 2.6 cm. This is just inferior to an area of chronic encephalomalacia and prior infarct. Differential diagnosis includes subacute infarct or metastatic disease in a patient with a known history of metastatic lung cancer. No signs of acute infarct or hemorrhage. The lateral ventricles and midline structures are stable, with chronic small vessel ischemic changes within the thalami and bilateral basal ganglia. No acute extra-axial fluid collections. No mass effect. Vascular: No hyperdense vessel or unexpected calcification. Skull: Normal. Negative for fracture or focal lesion. Sinuses/Orbits: No acute finding. Postsurgical changes are seen within the maxillary sinuses and ethmoid air cells. Other: None IMPRESSION: 1. New hypodensity in the left parietal lobe, just inferior to an area of chronic  infarction. Differential diagnosis includes metastatic disease or acute/subacute infarct. MRI may be useful for further evaluation. 2. No acute hemorrhage. Electronically Signed   By: Randa Ngo M.D.   On: 01/12/2020 16:57   MR BRAIN WO CONTRAST  Result Date: 01/12/2020 CLINICAL DATA:  Stroke follow-up. Difficulty speaking. Cephalopathy. EXAM: MRI HEAD WITHOUT CONTRAST TECHNIQUE: Multiplanar, multiecho pulse sequences of the brain and surrounding structures were obtained without intravenous contrast. COMPARISON:  Brain MRI 08/03/2019 FINDINGS: Brain: There is an acute infarct within the  left PCA territory, predominantly in the lateral left temporal lobe. No other site of acute ischemia. There are old bilateral occipital infarcts and old cerebellar and basal ganglia small vessel infarcts. There is multifocal periventricular Mcneil matter hyperintensity, most often a result of chronic microvascular ischemia. There is generalized atrophy without lobar predilection. Area of hemosiderin deposition in the posterior left parietal lobe. Vascular: Normal flow voids. Skull and upper cervical spine: Normal marrow signal. Sinuses/Orbits: Negative. Other: None. IMPRESSION: Acute left PCA territory infarct, predominantly in the lateral left temporal lobe. No hemorrhage or mass effect. Electronically Signed   By: Ulyses Jarred M.D.   On: 01/12/2020 23:45   DG HIP UNILAT W OR W/O PELVIS 2-3 VIEWS LEFT  Result Date: 01/12/2020 CLINICAL DATA:  Chronic BILATERAL hip pain LEFT greater than RIGHT; history rectal cancer EXAM: DG HIP (WITH OR WITHOUT PELVIS) 2-3V LEFT COMPARISON:  None Correlation: CT abdomen and pelvis 03/04/2018, PET-CT 03/13/2018 FINDINGS: Osseous demineralization. Hip and SI joint spaces preserved. Mixed lytic and sclerotic expansile process identified at the LEFT inferior pubic ramus laterally new since 2019. Finding could represent a destructive bone lesion, healing fracture or pathologic fracture. No additional fracture, dislocation or bone destruction. Multilevel degenerative disc and facet disease changes of lumbar spine. IMPRESSION: Mixed lytic and sclerotic process at the lateral aspect of the LEFT inferior pubic ramus, with differential diagnosis including destructive bone lesion, healing fracture and pathologic fracture; further evaluation by MR recommended to exclude pathologic/destructive lesion. These results will be called to the ordering clinician or representative by the Radiologist Assistant, and communication documented in the PACS or [employer]. Electronically Signed   By:  Lavonia Dana M.D.   On: 01/12/2020 08:36   DG HIP UNILAT W OR W/O PELVIS 2-3 VIEWS RIGHT  Result Date: 01/12/2020 CLINICAL DATA:  Right hip pain EXAM: DG HIP (WITH OR WITHOUT PELVIS) 2-3V RIGHT COMPARISON:  None. FINDINGS: Mild diffuse osteopenia. The no acute fracture or subluxation identified. Marked degenerative disc disease is identified within the visualized portions of the lower lumbar spine. Mild bilateral hip joint osteoarthritis. There is abnormal bone mineralization involving the left inferior pubic rami. There is cortical irregularity and increased lucency. IMPRESSION: 1. Abnormal appearance of the left inferior pubic rami with cortical irregularity and increased trabecular lucency. Cannot rule out osseous metastasis. 2. Mild bilateral hip osteoarthritis and lumbar degenerative disc disease. Electronically Signed   By: Kerby Moors M.D.   On: 01/12/2020 08:35        Scheduled Meds:  aspirin  81 mg Oral Daily   gabapentin  100 mg Oral BID   heparin  5,000 Units Subcutaneous Q8H   insulin aspart  0-9 Units Subcutaneous TID WC   mirtazapine  15 mg Oral QHS   Continuous Infusions:   LOS: 0 days    Time spent: 35 mins     Wyvonnia Dusky, MD [facility] Pager [phone]  If 7PM-7AM, please contact night-coverage [email or web address] 01/13/2020, 7:14 AM

## 2020-01-13 NOTE — Progress Notes (Signed)
*  PRELIMINARY RESULTS* Echocardiogram 2D Echocardiogram has been performed.  Destiny [name] 01/13/2020, 1:58 PM

## 2020-01-13 NOTE — Progress Notes (Signed)
SLP Cancellation Note  Patient Details Name: Destiny [name] MRN: [id number] DOB: 27-Feb-1924   Cancelled treatment:       Reason Eval/Treat Not Completed: Patient declined, no reason specified(chart reviewed; consulted pt's Dtr then [place]). Upon entering pt's room, SLP explained the consult request for cognitive-linguistic assessment/screening noting pt's initial "difficulty speaking" at admission. Pt's Dtr explained that [place] services were "not needed" and that she wanted to speak to [place] re: "wanting to go home now". Pt's Dtr stated again ST services were not needed. NSG updated and went to room to discuss w/ Dtr.  MD to reconsult ST services if any needs while still admitted.     Orinda Kenner, MS, CCC-SLP [place] 01/13/2020, 2:45 PM

## 2020-01-13 NOTE — Evaluation (Signed)
Physical Therapy Evaluation Patient Details Name: Destiny [name] MRN: [id number] DOB: 09-18-1924 Today's Date: 01/13/2020   History of Present Illness  Destiny [name] is a 86 y.o. female with medical history significant for malignant rectal adenocarcinoma with pulmonary metastasis s/p APR with end sigmoid colostomy in place, history of CVA, type 2 diabetes, hypertension, hyperlipidemia who presents to the ED for evaluation of aphasia.  Acute left PCA territory infarct, predominantly in the lateral lefttemporal lobe. No hemorrhage or mass effect. covid positive  Clinical Impression  Pt is a 86 yo female admitted for above. Pt received resting in bed with daughter present. Pt reports living in a one story condo with her daughter who is with her 24/7. Pt reports being independent with all ADLs and uses a rollator for ambulation. Per daughter, daughter occasionally has to assist pt with dressing and bathing. Pt min guard to get EOB and perform STS with RW. Pt with no evidence of aphasia during session, alert and oriented x4. No significant unilateral strength differences. Pt able to maintain static standing balance with single UE support for assist with donning of underwear. Pt min guard for ambulating to/from restroom. Pt ambulating on RA with O2 sats monitored and dropping to 88% but quickly recovering while on toilet. O2 sats dropped to 89% on RA during return ambulation to bed and again recovering quickly. Pt with no reports of SOB or DOE. Pt presents with dec strength, balance and activity tolerance limiting functional mobility. Pt would benefit from further acute PT to improve noted deficits to mazimize return to PLOF, increase independence, decrease caregiver burden and fall risk. Recommend for HHPT following hospital discharge for continuing to maximize functional mobility.     Follow Up Recommendations Home health PT    Equipment Recommendations  3in1 (PT)    Recommendations for Other Services        Precautions / Restrictions Precautions Precautions: Fall;Other (comment) Precaution Comments: Airbone/contact precautions - COVID 19 Restrictions Weight Bearing Restrictions: No      Mobility  Bed Mobility Overal bed mobility: Needs Assistance Bed Mobility: Supine to Sit;Sit to Supine     Supine to sit: Min guard;HOB elevated Sit to supine: Min assist;HOB elevated   General bed mobility comments: min A for LE advancement, mod A for scooting up in bed  Transfers Overall transfer level: Needs assistance Equipment used: Rolling walker (2 wheeled) Transfers: Sit to/from [facility] Sit to Stand: Min guard Stand pivot transfers: Min guard       General transfer comment: min guard for safety, cuing for hand placement with RW, cuing for safety with line mgt during stand pivot to toilet, no overt LOB or unsteadiness noted  Ambulation/Gait Ambulation/Gait assistance: Min guard;Min assist Gait Distance (Feet): 10 Feet Assistive device: Rolling walker (2 wheeled) Gait Pattern/deviations: Step-through pattern;Decreased stride length;Trunk flexed Gait velocity: dec   General Gait Details: pt ambulated to/from restroom, min guard for safety with periods of min A for RW mgt especially around obstacles, no over LOB noted  Stairs            Wheelchair Mobility    Modified Rankin (Stroke Patients Only)       Balance Overall balance assessment: Mild deficits observed, not formally tested                                           Pertinent Vitals/Pain  Pain Assessment: No/denies pain    Home Living Family/patient expects to be discharged to:: Private residence Living Arrangements: Children Available Help at Discharge: Family;Available 24 hours/day Type of Home: (condo) Home Access: Level entry     Home Layout: One level Home Equipment: Shower seat - built in;Walker - 4 wheels;Cane - single point;Bedside commode      Prior  Function Level of Independence: Needs assistance;Independent with assistive device(s)   Gait / Transfers Assistance Needed: Patient reports using rollator for functional mobility.  ADL's / Homemaking Assistance Needed: Patient able to complete BADLs at MOD I prior, but due to recent back/hip/knee pain, has occasionally required assistance for LB dressing.  Comments: per daughter, pt needs light assist with dressing and bathing, no recent falls     Hand Dominance   Dominant Hand: Right    Extremity/Trunk Assessment   Upper Extremity Assessment Upper Extremity Assessment: Generalized weakness    Lower Extremity Assessment Lower Extremity Assessment: Generalized weakness       Communication   Communication: No difficulties  Cognition Arousal/Alertness: Awake/alert Behavior During Therapy: WFL for tasks assessed/performed Overall Cognitive Status: Within Functional Limits for tasks assessed                                 General Comments: A&Ox4. Pleasant and cooperative      General Comments General comments (skin integrity, edema, etc.): HR fluctuating with nursing notified, most fluctuation from 80s up to 147 was at rest, pt seemed a little anxious over everything that has happened since arrival and very concerned over covid dx with pt stating she doesnt even go to her mailbox, during ambulation HR stable, O2 sats dropped to 88% on RA during ambulation recovering quickly with seated rest    Exercises Other Exercises Other Exercises: Provided education on goals and role of OT in acute care setting Other Exercises: Provided general safety education regarding bed controls, call light, telemetry leads, etc Other Exercises: Patient completed toileting with CGA-SBA using RW.  Cues needed for safety regarding telemetry leads.  Completed LB dressing during sit<>stand with SBA and cues to use RW Other Exercises: Provided education on use of body mechanics, sequencing and  self pacing to improve safety during bed mobility and functional transfers   Assessment/Plan    PT Assessment Patient needs continued PT services  PT Problem List Decreased strength;Decreased mobility;Decreased safety awareness;Decreased range of motion;Decreased activity tolerance;Decreased balance;Decreased knowledge of use of DME;Cardiopulmonary status limiting activity       PT Treatment Interventions DME instruction;Therapeutic exercise;Balance training;Gait training;Stair training;Neuromuscular re-education;Functional mobility training;Cognitive remediation;Therapeutic activities;Patient/family education    PT Goals (Current goals can be found in the Care Plan section)  Acute Rehab PT Goals Patient Stated Goal: "Get back home" PT Goal Formulation: With patient Time For Goal Achievement: 01/27/20 Potential to Achieve Goals: Good    Frequency 7X/week   Barriers to discharge        Co-evaluation               AM-PAC PT "6 Clicks" Mobility  Outcome Measure Help needed turning from your back to your side while in a flat bed without using bedrails?: A Little Help needed moving from lying on your back to sitting on the side of a flat bed without using bedrails?: A Little Help needed moving to and from a bed to a chair (including a wheelchair)?: A Little Help needed standing up from a chair  using your arms (e.g., wheelchair or bedside chair)?: A Little Help needed to walk in hospital room?: A Little Help needed climbing 3-5 steps with a railing? : A Little 6 Click Score: 18    End of Session Equipment Utilized During Treatment: Gait belt;Oxygen Activity Tolerance: Patient tolerated treatment well Patient left: in bed;with call bell/phone within reach;with family/visitor present;with nursing/sitter in room Nurse Communication: Mobility status PT Visit Diagnosis: Muscle weakness (generalized) (M62.81);Other abnormalities of gait and mobility (R26.89)    Time:  [id number] PT Time Calculation (min) (ACUTE ONLY): 48 min   Charges:   PT Evaluation $PT Eval Moderate Complexity: 1 Mod PT Treatments $Therapeutic Activity: 23-37 mins        Destiny [name] PT, DPT 3:52 PM,01/13/20 [phone]   Destiny [name] 01/13/2020, 1:24 PM

## 2020-01-13 NOTE — Progress Notes (Signed)
Notified MD of patient refusing all hospital services at this time and would like to be discharged back with hospice with no major interventions at this time.

## 2020-01-13 NOTE — Discharge Instructions (Signed)
Eating Plan After Stroke A stroke causes damage to the brain cells, which can affect your ability to walk, talk, and even eat. The impact of a stroke is different for everyone, and so is recovery. A good nutrition plan is important for your recovery. It can also lower your risk of another stroke. If you have difficulty chewing and swallowing your food, a dietitian or your stroke care team can help so that you can enjoy eating healthy foods. What are tips for following this plan?  Reading food labels  Choose foods that have less than 300 milligrams (mg) of sodium per serving. Limit your sodium intake to less than 1,500 mg per day.  Avoid foods that have saturated fat and trans fat.  Choose foods that are low in cholesterol. Limit the amount of cholesterol you eat each day to less than 200 mg.  Choose foods that are high in fiber. Eat 20-30 grams (g) of fiber each day.  Avoid foods with added sugar. Check the food label for ingredients such as sugar, corn syrup, honey, fructose, molasses, and cane juice. Shopping  At the grocery store, buy most of your food from areas near the walls of the store. This includes: ? Fresh fruits and vegetables. ? Dry grains, beans, nuts, and seeds. ? Fresh seafood, poultry, lean meats, and eggs. ? Low-fat dairy products.  Buy whole ingredients instead of prepackaged foods.  Buy fresh, in-season fruits and vegetables from local farmers markets.  Buy frozen fruits and vegetables in resealable bags. Cooking  Prepare foods with very little salt. Use herbs or salt-free spices instead.  Cook with heart-healthy oils, such as olive, avocado, canola, soybean, or sunflower oil.  Avoid frying foods. Bake, grill, or broil foods instead.  Remove visible fat and skin from meat and poultry before eating.  Modify food textures as told by your health care provider. Meal planning  Eat a wide variety of colorful fruits and vegetables. Make sure one-half of your  plate is filled with fruits and vegetables at each meal.  Eat fruits and vegetables that are high in potassium, such as: ? Apples, bananas, oranges, and melon. ? Sweet potatoes, spinach, zucchini, and tomatoes.  Eat fish that contain heart-healthy fats (omega-3 fats) at least twice a week. These include salmon, tuna, mackerel, and sardines.  Eat plant foods that are high in omega-3 fats, such as flaxseeds and walnuts. Add these to cereals, yogurt, or pasta dishes.  Eat several servings of high-fiber foods each day, such as fruits, vegetables, whole grains, and beans.  Do not put salt at the table for meals.  When eating out at restaurants: ? Ask the server about low-salt or salt-free food options. ? Avoid fried foods. Look for menu items that are grilled, steamed, broiled, or roasted. ? Ask if your food can be prepared without butter. ? Ask for condiments, such as salad dressings, gravy, or sauces to be served on the side.  If you have difficulty swallowing: ? Choose foods that are softer and easier to chew and swallow. ? Cut foods into small pieces and chew well before swallowing. ? Thicken liquids as told by your health care provider or dietitian. ? Let your health care provider know if your condition does not improve over time. You may need to work with a speech therapist to re-train the muscles that are used for eating. General recommendations  Involve your family and friends in your recovery, if possible. It may be helpful to have a slower meal time   and to plan meals that include foods everyone in the family can eat.  Brush your teeth with fluoride toothpaste twice a day, and floss once a day. Keeping a clean mouth can help you swallow and can also help your appetite.  Drink enough water each day to keep your urine pale yellow. If needed, set reminders or ask your family to help you remember to drink water.  Limit alcohol intake to no more than 1 drink a day for nonpregnant  women and 2 drinks a day for men. One drink equals 12 oz of beer, 5 oz of wine, or 1 oz of hard liquor. Summary  Following this eating plan can help in your stroke recovery and can decrease your risk for another stroke.  Let your health care provider know if you have problems with swallowing. You may need to work with a speech therapist. This information is not intended to replace advice given to you by your health care provider. Make sure you discuss any questions you have with your health care provider. Document Revised: 01/21/2019 Document Reviewed: 12/08/2017 Elsevier Patient Education  2020 [facility] Discharge After a Stroke  Being discharged from the hospital after a stroke can feel overwhelming. Many things may be different, and it is normal to feel scared or anxious. Some stroke survivors may be able to return to their homes, and others may need more specialized care on a temporary or permanent basis. Your stroke care team will work with you to develop a discharge plan that is best for you. Ask questions if you do not understand something. Invite a friend or family member to participate in discharge planning. Understanding and following your discharge plan can help to prevent another stroke or other problems. Understanding your medicines After a stroke, your health care provider may prescribe one or more types of medicine. It is important to take medicines exactly as told by your health care provider. Serious harm, such as another stroke, can happen if you are unable to take your medicine exactly as prescribed. Make sure you understand:  What medicine to take.  Why you are taking the medicine.  How and when to take it.  If it can be taken with your other medicines and herbal supplements.  Possible side effects.  When to call your health care provider if you have any side effects.  How you will get and pay for your medicines. Medical assistance programs may be  able to help you pay for prescription medicines if you cannot afford them. If you are taking an anticoagulant, be sure to take it exactly as told by your health care provider. This type of medicine can increase the risk of bleeding because it works to prevent blood from clotting. You may need to take certain precautions to prevent bleeding. You should contact your health care provider if you have:  Bleeding or bruising.  A fall or other injury to your head.  Blood in your urine or stool (feces). Planning for home safety  Take steps to prevent falls, such as installing grab bars or using a shower chair. Ask a friend or family member to get needed things in place before you go home if possible. A therapist can come to your home to make recommendations for safety equipment. Ask your health care provider if you would benefit from this service or from home care. Getting needed equipment Ask your health care provider for a list of any medical equipment and supplies you will  need at home. These may include items such as:  Walkers.  Canes.  Wheelchairs.  Hand-strengthening devices.  Special eating utensils. Medical equipment can be rented or purchased, depending on your insurance coverage. Check with your insurance company about what is covered. Keeping follow-up visits After a stroke, you will need to follow up regularly with a health care provider. You may also need rehabilitation, which can include physical therapy, occupational therapy, or speech-language therapy. Keeping these appointments is very important to your recovery after a stroke. Be sure to bring your medicine list and discharge papers with you to your appointments. If you need help to keep track of your schedule, use a calendar or appointment reminder. Preventing another stroke Having a stroke puts you at risk for another stroke in the future. Ask your health care provider what actions you can take to lower the risk. These may  include:  Increasing how much you exercise.  Making a healthy eating plan.  Quitting smoking.  Managing other health conditions, such as high blood pressure, high cholesterol, or diabetes.  Limiting alcohol use. Knowing the warning signs of a stroke  Make sure you understand the signs of a stroke. Before you leave the hospital, you will receive information outlining the stroke warning signs. Share these with your friends and family members. "BE FAST" is an easy way to remember the main warning signs of a stroke:  B - Balance. Signs are dizziness, sudden trouble walking, or loss of balance.  E - Eyes. Signs are trouble seeing or a sudden change in vision.  F - Face. Signs are sudden weakness or numbness of the face, or the face or eyelid drooping on one side.  A - Arms. Signs are weakness or numbness in an arm. This happens suddenly and usually on one side of the body.  S - Speech. Signs are sudden trouble speaking, slurred speech, or trouble understanding what people say.  T - Time. Time to call emergency services. Write down what time symptoms started. Other signs of stroke may include:  A sudden, severe headache with no known cause.  Nausea or vomiting.  Seizure. These symptoms may represent a serious problem that is an emergency. Do not wait to see if the symptoms will go away. Get medical help right away. Call your local emergency services (911 in the [place] Do not drive yourself to the hospital. Make note of the time that you had your first symptoms. Your emergency responders or emergency room staff will need to know this information. Summary  Being discharged from the hospital after a stroke can feel overwhelming. It is normal to feel scared or anxious.  Make sure you take medicines exactly as told by your health care provider.  Know the warning signs of a stroke, and get help right way if you have any of these symptoms. "BE FAST" is an easy way to remember the main  warning signs of a stroke. This information is not intended to replace advice given to you by your health care provider. Make sure you discuss any questions you have with your health care provider. Document Revised: 06/23/2019 Document Reviewed: 01/03/2017 Elsevier Patient Education  [place].

## 2020-01-13 NOTE — Progress Notes (Signed)
Patients daughter asking for repeat covid test. Notified MD. Will continue to monitor.

## 2020-01-13 NOTE — TOC Progression Note (Signed)
Transition of Care (TOC) - Progression Note    Patient Details  Name: Destiny [name] MRN: [id number] Date of Birth: 02/27/1924  Transition of Care (TOC) CM/SW Contact  Dupree, Rebecca G, LCSW Phone Number: 01/13/2020, 4:12 PM  Clinical Narrative: Met with pt and daughter briefly to answer questions. [facility] and pt has had to resend this for two days due to coming into the hospital. Daughter reports they will pick back up tomorrow. This worker spoke with [facility] and all she wanted was a discharge summary. Pt and daughter to return home with daughter being pt's primary caregiver. Decided to not pursue other work up due to hospice pt. Spoke with bedside RN and she is aware all set for discharge today. Answered daughter's questions and addressed her concerns.         Expected Discharge Plan and Services           Expected Discharge Date: 01/13/20                                     Social Determinants of Health (SDOH) Interventions    Readmission Risk Interventions No flowsheet data found.  

## 2020-01-13 NOTE — Telephone Encounter (Signed)
Daughter [name] called stating her mother has tested positive for Covid and will need to cancel procedure appt. For procedure. She also states physician is looking into hip pain as possible cancer pain. They are running some test. They are trying to make her comfortable. Would appreciate anything Dr. Dossie Arbour can do

## 2020-01-13 NOTE — Plan of Care (Signed)
  Problem: Education: Goal: Knowledge of General Education information will improve Description: Including pain rating scale, medication(s)/side effects and non-pharmacologic comfort measures Outcome: Adequate for Discharge   Problem: Health Behavior/Discharge Planning: Goal: Ability to manage health-related needs will improve Outcome: Adequate for Discharge   Problem: Clinical Measurements: Goal: Ability to maintain clinical measurements within normal limits will improve Outcome: Adequate for Discharge Goal: Will remain free from infection Outcome: Adequate for Discharge Goal: Diagnostic test results will improve Outcome: Adequate for Discharge Goal: Respiratory complications will improve Outcome: Adequate for Discharge Goal: Cardiovascular complication will be avoided Outcome: Adequate for Discharge   Problem: Activity: Goal: Risk for activity intolerance will decrease Outcome: Adequate for Discharge   Problem: Nutrition: Goal: Adequate nutrition will be maintained Outcome: Adequate for Discharge   Problem: Coping: Goal: Level of anxiety will decrease Outcome: Adequate for Discharge   Problem: Elimination: Goal: Will not experience complications related to bowel motility Outcome: Adequate for Discharge Goal: Will not experience complications related to urinary retention Outcome: Adequate for Discharge   Problem: Pain Managment: Goal: General experience of comfort will improve Outcome: Adequate for Discharge   Problem: Safety: Goal: Ability to remain free from injury will improve Outcome: Adequate for Discharge   Problem: Skin Integrity: Goal: Risk for impaired skin integrity will decrease Outcome: Adequate for Discharge   Problem: Education: Goal: Knowledge of General Education information will improve Description: Including pain rating scale, medication(s)/side effects and non-pharmacologic comfort measures Outcome: Adequate for Discharge   Problem: Health  Behavior/Discharge Planning: Goal: Ability to manage health-related needs will improve Outcome: Adequate for Discharge   Problem: Clinical Measurements: Goal: Ability to maintain clinical measurements within normal limits will improve Outcome: Adequate for Discharge Goal: Will remain free from infection Outcome: Adequate for Discharge Goal: Diagnostic test results will improve Outcome: Adequate for Discharge Goal: Respiratory complications will improve Outcome: Adequate for Discharge Goal: Cardiovascular complication will be avoided Outcome: Adequate for Discharge   Problem: Activity: Goal: Risk for activity intolerance will decrease Outcome: Adequate for Discharge   Problem: Nutrition: Goal: Adequate nutrition will be maintained Outcome: Adequate for Discharge   Problem: Coping: Goal: Level of anxiety will decrease Outcome: Adequate for Discharge   Problem: Elimination: Goal: Will not experience complications related to bowel motility Outcome: Adequate for Discharge Goal: Will not experience complications related to urinary retention Outcome: Adequate for Discharge   Problem: Pain Managment: Goal: General experience of comfort will improve Outcome: Adequate for Discharge   Problem: Safety: Goal: Ability to remain free from injury will improve Outcome: Adequate for Discharge   Problem: Skin Integrity: Goal: Risk for impaired skin integrity will decrease Outcome: Adequate for Discharge   Problem: Education: Goal: Knowledge of disease or condition will improve Outcome: Adequate for Discharge Goal: Knowledge of secondary prevention will improve Outcome: Adequate for Discharge   Problem: Coping: Goal: Will verbalize positive feelings about self Outcome: Adequate for Discharge Goal: Will identify appropriate support needs Outcome: Adequate for Discharge   Problem: Health Behavior/Discharge Planning: Goal: Ability to manage health-related needs will  improve Outcome: Adequate for Discharge   Problem: Self-Care: Goal: Ability to participate in self-care as condition permits will improve Outcome: Adequate for Discharge Goal: Ability to communicate needs accurately will improve Outcome: Adequate for Discharge   Problem: Nutrition: Goal: Risk of aspiration will decrease Outcome: Adequate for Discharge   Problem: Ischemic Stroke/TIA Tissue Perfusion: Goal: Complications of ischemic stroke/TIA will be minimized Outcome: Adequate for Discharge

## 2020-01-14 LAB — SARS CORONAVIRUS 2 (TAT 6-24 HRS): SARS Coronavirus 2: NEGATIVE

## 2020-01-17 NOTE — Telephone Encounter (Signed)
Call daughter and let her know that the hip x-rays show lesions compatible with metastatic disease to the hip/pelvis. Have her PCP Dion Body, MD know.

## 2020-01-17 NOTE — Telephone Encounter (Signed)
Called Ms Whites daughter to inform her of her xray. The daughter wanted me to tell you that they appreciated everything you have done for her. Your kindness and trying to figure things out for her mom will always be remembered.

## 2020-01-18 ENCOUNTER — Ambulatory Visit: Payer: Medicare PPO | Admitting: Pain Medicine

## 2020-02-12 DEATH — deceased
